# Patient Record
Sex: Male | Born: 1980 | Race: White | Hispanic: No | Marital: Single | Smoking: Current every day smoker
Health system: Southern US, Community
[De-identification: ages and names within clinical notes are randomized; demographics above are authoritative.]

## PROBLEM LIST (undated history)

## (undated) ENCOUNTER — Emergency Department (HOSPITAL_COMMUNITY): Discharge status: Absent without leave | Payer: Medicaid Other

## (undated) DIAGNOSIS — K449 Diaphragmatic hernia without obstruction or gangrene: Secondary | ICD-10-CM

## (undated) DIAGNOSIS — R1110 Vomiting, unspecified: Secondary | ICD-10-CM

## (undated) DIAGNOSIS — R0602 Shortness of breath: Secondary | ICD-10-CM

## (undated) DIAGNOSIS — Z9889 Other specified postprocedural states: Secondary | ICD-10-CM

## (undated) DIAGNOSIS — G4730 Sleep apnea, unspecified: Secondary | ICD-10-CM

## (undated) DIAGNOSIS — K219 Gastro-esophageal reflux disease without esophagitis: Secondary | ICD-10-CM

## (undated) DIAGNOSIS — G8929 Other chronic pain: Secondary | ICD-10-CM

## (undated) DIAGNOSIS — F419 Anxiety disorder, unspecified: Secondary | ICD-10-CM

## (undated) DIAGNOSIS — K439 Ventral hernia without obstruction or gangrene: Secondary | ICD-10-CM

## (undated) DIAGNOSIS — S0291XA Unspecified fracture of skull, initial encounter for closed fracture: Secondary | ICD-10-CM

## (undated) DIAGNOSIS — Z148 Genetic carrier of other disease: Secondary | ICD-10-CM

## (undated) DIAGNOSIS — F329 Major depressive disorder, single episode, unspecified: Secondary | ICD-10-CM

## (undated) DIAGNOSIS — F32A Depression, unspecified: Secondary | ICD-10-CM

## (undated) DIAGNOSIS — I10 Essential (primary) hypertension: Secondary | ICD-10-CM

## (undated) DIAGNOSIS — R109 Unspecified abdominal pain: Secondary | ICD-10-CM

## (undated) DIAGNOSIS — F3181 Bipolar II disorder: Secondary | ICD-10-CM

## (undated) HISTORY — DX: Vomiting, unspecified: R11.10

## (undated) HISTORY — DX: Other specified postprocedural states: Z98.890

## (undated) HISTORY — PX: INGUINAL HERNIA REPAIR: SUR1180

## (undated) HISTORY — DX: Other chronic pain: G89.29

## (undated) HISTORY — DX: Genetic carrier of other disease: Z14.8

## (undated) HISTORY — DX: Unspecified abdominal pain: R10.9

## (undated) HISTORY — DX: Ventral hernia without obstruction or gangrene: K43.9

## (undated) HISTORY — DX: Bipolar II disorder: F31.81

---

## 2001-04-12 ENCOUNTER — Encounter: Payer: Self-pay | Admitting: *Deleted

## 2001-04-12 ENCOUNTER — Emergency Department (HOSPITAL_COMMUNITY): Admission: EM | Admit: 2001-04-12 | Discharge: 2001-04-12 | Payer: Self-pay | Admitting: *Deleted

## 2001-04-24 ENCOUNTER — Ambulatory Visit (HOSPITAL_COMMUNITY): Admission: RE | Admit: 2001-04-24 | Discharge: 2001-04-24 | Payer: Self-pay | Admitting: Orthopaedic Surgery

## 2001-04-24 ENCOUNTER — Encounter: Payer: Self-pay | Admitting: Orthopaedic Surgery

## 2001-04-24 ENCOUNTER — Emergency Department (HOSPITAL_COMMUNITY): Admission: EM | Admit: 2001-04-24 | Discharge: 2001-04-24 | Payer: Self-pay | Admitting: Emergency Medicine

## 2001-09-22 ENCOUNTER — Ambulatory Visit (HOSPITAL_COMMUNITY): Admission: RE | Admit: 2001-09-22 | Discharge: 2001-09-22 | Payer: Self-pay | Admitting: Unknown Physician Specialty

## 2001-09-22 ENCOUNTER — Encounter: Payer: Self-pay | Admitting: Unknown Physician Specialty

## 2001-11-05 ENCOUNTER — Emergency Department (HOSPITAL_COMMUNITY): Admission: EM | Admit: 2001-11-05 | Discharge: 2001-11-05 | Payer: Self-pay | Admitting: *Deleted

## 2002-05-05 ENCOUNTER — Emergency Department (HOSPITAL_COMMUNITY): Admission: EM | Admit: 2002-05-05 | Discharge: 2002-05-05 | Payer: Self-pay | Admitting: Emergency Medicine

## 2003-05-13 ENCOUNTER — Ambulatory Visit: Payer: Self-pay | Admitting: Internal Medicine

## 2003-11-07 ENCOUNTER — Emergency Department (HOSPITAL_COMMUNITY): Admission: EM | Admit: 2003-11-07 | Discharge: 2003-11-07 | Payer: Self-pay | Admitting: Emergency Medicine

## 2004-02-18 ENCOUNTER — Emergency Department (HOSPITAL_COMMUNITY): Admission: EM | Admit: 2004-02-18 | Discharge: 2004-02-19 | Payer: Self-pay | Admitting: Emergency Medicine

## 2004-08-14 ENCOUNTER — Emergency Department (HOSPITAL_COMMUNITY): Admission: EM | Admit: 2004-08-14 | Discharge: 2004-08-14 | Payer: Self-pay | Admitting: Emergency Medicine

## 2004-08-22 ENCOUNTER — Emergency Department (HOSPITAL_COMMUNITY): Admission: EM | Admit: 2004-08-22 | Discharge: 2004-08-22 | Payer: Self-pay | Admitting: Emergency Medicine

## 2004-08-26 ENCOUNTER — Emergency Department (HOSPITAL_COMMUNITY): Admission: EM | Admit: 2004-08-26 | Discharge: 2004-08-26 | Payer: Self-pay | Admitting: Emergency Medicine

## 2004-09-27 ENCOUNTER — Emergency Department (HOSPITAL_COMMUNITY): Admission: EM | Admit: 2004-09-27 | Discharge: 2004-09-27 | Payer: Self-pay | Admitting: Emergency Medicine

## 2005-10-02 ENCOUNTER — Emergency Department (HOSPITAL_COMMUNITY): Admission: EM | Admit: 2005-10-02 | Discharge: 2005-10-02 | Payer: Self-pay | Admitting: Emergency Medicine

## 2005-10-10 ENCOUNTER — Ambulatory Visit (HOSPITAL_COMMUNITY): Admission: RE | Admit: 2005-10-10 | Discharge: 2005-10-10 | Payer: Self-pay | Admitting: Orthopaedic Surgery

## 2006-05-06 HISTORY — PX: ESOPHAGOGASTRODUODENOSCOPY: SHX1529

## 2006-05-10 ENCOUNTER — Ambulatory Visit: Payer: Self-pay | Admitting: Internal Medicine

## 2006-05-10 ENCOUNTER — Inpatient Hospital Stay (HOSPITAL_COMMUNITY): Admission: EM | Admit: 2006-05-10 | Discharge: 2006-05-12 | Payer: Self-pay | Admitting: Emergency Medicine

## 2007-01-13 ENCOUNTER — Emergency Department (HOSPITAL_COMMUNITY): Admission: EM | Admit: 2007-01-13 | Discharge: 2007-01-13 | Payer: Self-pay | Admitting: Emergency Medicine

## 2007-05-07 HISTORY — PX: KNEE SURGERY: SHX244

## 2007-08-27 ENCOUNTER — Encounter (HOSPITAL_COMMUNITY): Admission: RE | Admit: 2007-08-27 | Discharge: 2007-09-26 | Payer: Self-pay | Admitting: Orthopedic Surgery

## 2007-10-20 ENCOUNTER — Encounter (HOSPITAL_COMMUNITY): Admission: RE | Admit: 2007-10-20 | Discharge: 2007-11-19 | Payer: Self-pay | Admitting: Orthopedic Surgery

## 2007-11-23 ENCOUNTER — Encounter (HOSPITAL_COMMUNITY): Admission: RE | Admit: 2007-11-23 | Discharge: 2007-12-23 | Payer: Self-pay | Admitting: Orthopedic Surgery

## 2007-12-31 ENCOUNTER — Encounter (HOSPITAL_COMMUNITY): Admission: RE | Admit: 2007-12-31 | Discharge: 2008-01-30 | Payer: Self-pay | Admitting: Orthopedic Surgery

## 2008-03-21 ENCOUNTER — Ambulatory Visit: Payer: Self-pay | Admitting: Gastroenterology

## 2008-03-23 ENCOUNTER — Encounter: Payer: Self-pay | Admitting: Gastroenterology

## 2008-03-23 ENCOUNTER — Ambulatory Visit: Payer: Self-pay | Admitting: Gastroenterology

## 2008-03-23 ENCOUNTER — Ambulatory Visit (HOSPITAL_COMMUNITY): Admission: RE | Admit: 2008-03-23 | Discharge: 2008-03-23 | Payer: Self-pay | Admitting: Gastroenterology

## 2008-03-23 HISTORY — PX: ESOPHAGOGASTRODUODENOSCOPY: SHX1529

## 2008-04-08 ENCOUNTER — Ambulatory Visit: Payer: Self-pay | Admitting: Gastroenterology

## 2008-04-15 ENCOUNTER — Encounter (HOSPITAL_COMMUNITY): Admission: RE | Admit: 2008-04-15 | Discharge: 2008-05-03 | Payer: Self-pay | Admitting: Gastroenterology

## 2008-09-03 HISTORY — PX: APPENDECTOMY: SHX54

## 2008-10-09 ENCOUNTER — Emergency Department (HOSPITAL_COMMUNITY): Admission: EM | Admit: 2008-10-09 | Discharge: 2008-10-09 | Payer: Self-pay | Admitting: Emergency Medicine

## 2008-11-17 ENCOUNTER — Ambulatory Visit (HOSPITAL_COMMUNITY): Admission: RE | Admit: 2008-11-17 | Discharge: 2008-11-17 | Payer: Self-pay | Admitting: General Surgery

## 2009-01-10 ENCOUNTER — Emergency Department (HOSPITAL_COMMUNITY): Admission: EM | Admit: 2009-01-10 | Discharge: 2009-01-11 | Payer: Self-pay | Admitting: Emergency Medicine

## 2009-01-17 ENCOUNTER — Encounter (HOSPITAL_COMMUNITY): Admission: RE | Admit: 2009-01-17 | Discharge: 2009-02-01 | Payer: Self-pay | Admitting: General Surgery

## 2009-02-01 ENCOUNTER — Emergency Department (HOSPITAL_COMMUNITY): Admission: EM | Admit: 2009-02-01 | Discharge: 2009-02-02 | Payer: Self-pay | Admitting: Emergency Medicine

## 2009-02-03 ENCOUNTER — Encounter (INDEPENDENT_AMBULATORY_CARE_PROVIDER_SITE_OTHER): Payer: Self-pay | Admitting: General Surgery

## 2009-02-03 ENCOUNTER — Ambulatory Visit (HOSPITAL_COMMUNITY): Admission: RE | Admit: 2009-02-03 | Discharge: 2009-02-03 | Payer: Self-pay | Admitting: General Surgery

## 2009-02-03 HISTORY — PX: CHOLECYSTECTOMY: SHX55

## 2009-02-08 ENCOUNTER — Emergency Department (HOSPITAL_COMMUNITY): Admission: EM | Admit: 2009-02-08 | Discharge: 2009-02-09 | Payer: Self-pay | Admitting: Emergency Medicine

## 2009-03-20 DIAGNOSIS — F329 Major depressive disorder, single episode, unspecified: Secondary | ICD-10-CM

## 2009-03-20 DIAGNOSIS — K219 Gastro-esophageal reflux disease without esophagitis: Secondary | ICD-10-CM

## 2009-03-20 DIAGNOSIS — I10 Essential (primary) hypertension: Secondary | ICD-10-CM | POA: Insufficient documentation

## 2009-03-20 DIAGNOSIS — K920 Hematemesis: Secondary | ICD-10-CM

## 2009-03-20 DIAGNOSIS — R1084 Generalized abdominal pain: Secondary | ICD-10-CM

## 2009-03-20 DIAGNOSIS — F17200 Nicotine dependence, unspecified, uncomplicated: Secondary | ICD-10-CM

## 2009-03-20 DIAGNOSIS — M25519 Pain in unspecified shoulder: Secondary | ICD-10-CM

## 2009-03-20 DIAGNOSIS — A4902 Methicillin resistant Staphylococcus aureus infection, unspecified site: Secondary | ICD-10-CM | POA: Insufficient documentation

## 2009-03-20 DIAGNOSIS — Z9189 Other specified personal risk factors, not elsewhere classified: Secondary | ICD-10-CM | POA: Insufficient documentation

## 2009-03-20 DIAGNOSIS — R630 Anorexia: Secondary | ICD-10-CM

## 2009-03-21 ENCOUNTER — Ambulatory Visit: Payer: Self-pay | Admitting: Internal Medicine

## 2009-03-21 ENCOUNTER — Encounter: Payer: Self-pay | Admitting: Gastroenterology

## 2009-03-21 DIAGNOSIS — R1013 Epigastric pain: Secondary | ICD-10-CM | POA: Insufficient documentation

## 2009-03-21 DIAGNOSIS — R635 Abnormal weight gain: Secondary | ICD-10-CM

## 2009-04-03 ENCOUNTER — Encounter: Payer: Self-pay | Admitting: Gastroenterology

## 2009-04-04 ENCOUNTER — Telehealth (INDEPENDENT_AMBULATORY_CARE_PROVIDER_SITE_OTHER): Payer: Self-pay

## 2009-05-08 ENCOUNTER — Encounter (INDEPENDENT_AMBULATORY_CARE_PROVIDER_SITE_OTHER): Payer: Self-pay | Admitting: *Deleted

## 2009-05-15 ENCOUNTER — Encounter: Payer: Self-pay | Admitting: Gastroenterology

## 2009-05-15 ENCOUNTER — Telehealth (INDEPENDENT_AMBULATORY_CARE_PROVIDER_SITE_OTHER): Payer: Self-pay

## 2009-05-15 DIAGNOSIS — R112 Nausea with vomiting, unspecified: Secondary | ICD-10-CM | POA: Insufficient documentation

## 2009-05-18 ENCOUNTER — Telehealth (INDEPENDENT_AMBULATORY_CARE_PROVIDER_SITE_OTHER): Payer: Self-pay

## 2009-05-18 LAB — CONVERTED CEMR LAB
AST: 16 units/L (ref 0–37)
Alkaline Phosphatase: 88 units/L (ref 39–117)
BUN: 12 mg/dL (ref 6–23)
Basophils Absolute: 0 10*3/uL (ref 0.0–0.1)
Basophils Relative: 0 % (ref 0–1)
Chloride: 102 meq/L (ref 96–112)
Creatinine, Ser: 0.92 mg/dL (ref 0.40–1.50)
HCT: 45.8 % (ref 39.0–52.0)
Lipase: 12 units/L (ref 0–75)
MCHC: 34.5 g/dL (ref 30.0–36.0)
MCV: 91.1 fL (ref 78.0–100.0)
Monocytes Relative: 9 % (ref 3–12)
Neutrophils Relative %: 54 % (ref 43–77)
Platelets: 203 10*3/uL (ref 150–400)
Potassium: 3.7 meq/L (ref 3.5–5.3)
Sodium: 141 meq/L (ref 135–145)
Total Bilirubin: 0.6 mg/dL (ref 0.3–1.2)
Total Protein: 6.9 g/dL (ref 6.0–8.3)
WBC: 7.6 10*3/uL (ref 4.0–10.5)

## 2009-05-24 ENCOUNTER — Ambulatory Visit: Payer: Self-pay | Admitting: Gastroenterology

## 2009-05-26 ENCOUNTER — Encounter: Payer: Self-pay | Admitting: Gastroenterology

## 2009-05-29 LAB — CONVERTED CEMR LAB
Basophils Absolute: 0 10*3/uL (ref 0.0–0.1)
Basophils Relative: 0 % (ref 0–1)
Cortisol - AM: 6.4 ug/dL (ref 4.3–22.4)
Eosinophils Absolute: 0.3 10*3/uL (ref 0.0–0.7)
Eosinophils Relative: 4 % (ref 0–5)
HCT: 44.7 % (ref 39.0–52.0)
Hemoglobin: 15 g/dL (ref 13.0–17.0)
Hgb A1c MFr Bld: 6.2 % — ABNORMAL HIGH (ref 4.6–6.1)
Lymphocytes Relative: 27 % (ref 12–46)
Lymphs Abs: 2.1 10*3/uL (ref 0.7–4.0)
MCHC: 33.6 g/dL (ref 30.0–36.0)
MCV: 94.1 fL (ref 78.0–100.0)
Monocytes Absolute: 0.8 10*3/uL (ref 0.1–1.0)
Monocytes Relative: 10 % (ref 3–12)
Neutro Abs: 4.8 10*3/uL (ref 1.7–7.7)
Neutrophils Relative %: 60 % (ref 43–77)
Platelets: 225 10*3/uL (ref 150–400)
RBC: 4.75 M/uL (ref 4.22–5.81)
RDW: 12.7 % (ref 11.5–15.5)
WBC: 8 10*3/uL (ref 4.0–10.5)

## 2009-06-13 ENCOUNTER — Encounter: Payer: Self-pay | Admitting: Gastroenterology

## 2009-06-13 ENCOUNTER — Telehealth (INDEPENDENT_AMBULATORY_CARE_PROVIDER_SITE_OTHER): Payer: Self-pay

## 2009-06-19 ENCOUNTER — Emergency Department (HOSPITAL_COMMUNITY): Admission: EM | Admit: 2009-06-19 | Discharge: 2009-06-20 | Payer: Self-pay | Admitting: Emergency Medicine

## 2009-08-14 ENCOUNTER — Telehealth (INDEPENDENT_AMBULATORY_CARE_PROVIDER_SITE_OTHER): Payer: Self-pay | Admitting: *Deleted

## 2009-08-15 ENCOUNTER — Encounter: Payer: Self-pay | Admitting: Gastroenterology

## 2009-08-29 ENCOUNTER — Emergency Department (HOSPITAL_COMMUNITY): Admission: EM | Admit: 2009-08-29 | Discharge: 2009-08-30 | Payer: Self-pay | Admitting: Emergency Medicine

## 2009-09-06 ENCOUNTER — Ambulatory Visit: Payer: Self-pay | Admitting: Gastroenterology

## 2009-10-18 ENCOUNTER — Encounter: Payer: Self-pay | Admitting: Gastroenterology

## 2009-10-18 ENCOUNTER — Telehealth (INDEPENDENT_AMBULATORY_CARE_PROVIDER_SITE_OTHER): Payer: Self-pay | Admitting: *Deleted

## 2009-11-14 ENCOUNTER — Ambulatory Visit (HOSPITAL_COMMUNITY): Admission: RE | Admit: 2009-11-14 | Discharge: 2009-11-14 | Payer: Self-pay | Admitting: Family Medicine

## 2009-12-18 ENCOUNTER — Encounter: Payer: Self-pay | Admitting: Gastroenterology

## 2010-01-12 ENCOUNTER — Encounter: Payer: Self-pay | Admitting: Gastroenterology

## 2010-01-24 ENCOUNTER — Emergency Department (HOSPITAL_COMMUNITY): Admission: EM | Admit: 2010-01-24 | Discharge: 2010-01-24 | Payer: Self-pay | Admitting: Emergency Medicine

## 2010-02-04 ENCOUNTER — Emergency Department (HOSPITAL_COMMUNITY): Admission: EM | Admit: 2010-02-04 | Discharge: 2010-02-04 | Payer: Self-pay | Admitting: Emergency Medicine

## 2010-02-13 ENCOUNTER — Encounter: Payer: Self-pay | Admitting: Urgent Care

## 2010-02-13 ENCOUNTER — Ambulatory Visit: Admission: RE | Admit: 2010-02-13 | Discharge: 2010-02-13 | Payer: Self-pay | Admitting: Neurology

## 2010-02-27 ENCOUNTER — Emergency Department (HOSPITAL_COMMUNITY): Admission: EM | Admit: 2010-02-27 | Discharge: 2010-02-27 | Payer: Self-pay | Admitting: Emergency Medicine

## 2010-04-29 ENCOUNTER — Emergency Department (HOSPITAL_COMMUNITY)
Admission: EM | Admit: 2010-04-29 | Discharge: 2010-04-29 | Payer: Self-pay | Source: Home / Self Care | Admitting: Emergency Medicine

## 2010-06-05 NOTE — Progress Notes (Signed)
  Phone Note Call from Patient   Caller: Patient Summary of Call: Pt called and said the Carafate Rx was too expensive @ $170.00. He wants to know if there is something less  expensive to try. His cell # is [phone]. Initial call taken by: Cloria Spring LPN,  May 18, 2009 1:16 PM     Appended Document:  Please double check with pharmacy to see if they priced generic. I find it hard to believe this med is that expensive.  Appended Document:  Spoke with [facility]- there is no generic in the liquid form only in the tablets and pts insurance didnt pay anything on Rx.   Appended Document:  see how much for carafate GENERIC pill, one gram qac and at bedtime, 2 weeks supply, one refill.  Appended Document:  Called in Rx- the cost is $29.91- called pts number above and left voicemail with the information.   Appended Document:  Good.  Pt needs to keep OV for tomorrow with [facility].

## 2010-06-05 NOTE — Letter (Signed)
Summary: MENTAL HEALTH REFERRAL  MENTAL HEALTH REFERRAL   Imported By: Ave Filter 09/06/2009 11:02:46  _____________________________________________________________________  External Attachment:    Type:   Image     Comment:   External Document  Appended Document: MENTAL HEALTH REFERRAL Pt is already a pt at [facility] health..He was just seen there 2 weeks ago and has a f/u appt. on 10/11/09.

## 2010-06-05 NOTE — Letter (Signed)
Summary: DISABILITY DETERMINATION  DISABILITY DETERMINATION   Imported By: Rexene Alberts 01/12/2010 14:03:45  _____________________________________________________________________  External Attachment:    Type:   Image     Comment:   External Document

## 2010-06-05 NOTE — Assessment & Plan Note (Signed)
Summary: CHRONIC ABD PAIN   Visit Type:  Follow-up Visit Primary Care Provider:  Health Department  Chief Complaint:  follow up.  History of Present Illness: Still having abd pain. Can't do colonoscopy because prep makes him sick. Went to ED last week and saw Dr. Colon Branch. 3 IVF bags. Gave him Zofran for nausea. Works "alright". Pt seen by [facility] and "transferred back to Dr. Darrick Penna." Has right sided pain. Has frequent BMs.   Current Medications (verified): 1)  Claritin 10 Mg Tabs (Loratadine) .... Once Daily 2)  Xanax 1 Mg Tabs (Alprazolam) .... Qid 3)  Lisinopril-Hydrochlorothiazide 20-12.5 Mg Tabs (Lisinopril-Hydrochlorothiazide) .... Once Daily 4)  Omeprazole 40 Mg Cpdr (Omeprazole) .... Two Times A Day 5)  Promethazine Hcl 25 Mg Tabs (Promethazine Hcl) .... Take 1/2 Po 30 Minutes Prior To Meals and May Use 1/2 Tab By Mouth Every 4-6 Hours As Needed N/v 6)  Baclofen 10 Mg Tabs (Baclofen) .Marland Kitchen.. 1 By Mouth At 7am, 3 Pm and 9 Pm 7)  Lemental .... Two Times A Day 8)  Ambien 5 Mg Tabs (Zolpidem Tartrate) .... At Bedtime  Allergies (verified): 1)  ! Darvocet  Past History:  Past Medical History: Anxiety Disorder Depression GERD Hypertension history of MRSA Right shoulder pain MORBID OBESITY C/O CHRONIC ABD PAIN/VOMITING **EGD January 2008, Dr. Karilyn Cota, erosive reflux esophagitis erosive antral gastritis, H. pylori serologies neg  **EGD, November 2009, Dr. Cira Servant, gastritis. Benign esophageal polyp. No evidence of H. pylori. **Bravo pH study in November 2009, on Prilosec 40 mg b.i.d., showed adequate acid suppression **DEC 2009: nl GES **May 2010 while on vacation in [facility] and had an appendectomy.  **SEP 2010 for abd pain- HIDA SCAN SEP 2010 GB EF 93%. Cholecystectomy in OCT 2010.  PATH: chronic cholecystitis, no stones. **FEB 2011 SEEN AND EVALUATED AT [facility] abd wall pain, reflux surgery may follow weight loss, needs impedance prior to surgery. TCS to evaluate diarrhea. PT STATED HE  COULD NOT TOLERATE THE MIRALAX PREP.  Past Surgical History: Reviewed history from 03/21/2009 and no changes required. Hernia Surgery, left inguinal Knee surgery, right, 2009 Cholecystectomy, 10/10 Appendectomy, 5/10  Social History: Reviewed history from 05/24/2009 and no changes required. single. 2 children. Unemployed since 2007 with work-related injury.  Denies any alcohol use on a regular basis. Pt has no insurance.  Review of Systems       Per HPI otherwise all systems negative.  Vital Signs:  Patient profile:   30 year old male Height:      68 inches Weight:      303 pounds BMI:     46.24 Temp:     98.0 degrees F oral Pulse rate:   88 / minute BP sitting:   112 / 78  (left arm) Cuff size:   large  Vitals Entered By: Hendricks Limes LPN (Sep 06, 1608 10:11 AM)  Physical Exam  General:  Well developed, well nourished, no acute distress. Head:  Normocephalic and atraumatic. Eyes:  PERRLA, no icterus. Mouth:  No deformity or lesions. Neck:  Supple; no masses. Lungs:  Clear throughout to auscultation. Heart:  Regular rate and rhythm; no murmurs, rubs,  or bruits. Abdomen:  Soft, MILD ttp X4, without guarding, and without rebound nondistended. Normal bowel sounds. obese.    Impression & Recommendations:  Problem # 1:  ABDOMINAL PAIN, GENERALIZED (ICD-789.07) Assessment Unchanged Had a long discussion with the patient and his mother. Explained his complaint of not being able to eat anything is not consistent with his  weight increase over the last 2 years or with his being morbidly obese. Pt referred to mental health to manage his anxiety. Also he is given a referral for Nutrition for GERD/morbid obesity/weight loss program. Continue OMP and Baclofen. Phenergan as needed. Add Imipraine for chronic pain management. Follow up with Dr. Gerilyn Pilgrim. OPV in 3-4 mos. Pt is not a candidate for Nissen fundoplication due to his being morbidly obese.  TIME SPENT: 40  minutes to obtain  H&P, explain disease processes, and management  CC: PCP  Orders: Est. Patient Level V (64403) Prescriptions: IMIPRAMINE HCL 10 MG TABS (IMIPRAMINE HCL) 1 by mouth at bedtime for 3 days, 2 by mouth at bedtime for 3 days, and 3 by mouth qhs  #90 x 5   Entered and Authorized by:   West Bali MD   Signed by:   West Bali MD on 09/06/2009   Method used:   Electronically to        [facility]* (retail)       [address] [place], [place]  [address]       Ph: [phone]       Fax: [phone]   RxID:   [id number] PROMETHAZINE HCL 25 MG TABS (PROMETHAZINE HCL) Take 1/2 po 30 minutes prior to meals and may use 1/2 tab by mouth every 4-6 hours as needed n/v  #50 x 5   Entered and Authorized by:   West Bali MD   Signed by:   West Bali MD on 09/06/2009   Method used:   Electronically to        [facility]* (retail)       [address]       [place], [place]  [address]       Ph: [phone]       Fax: [phone]   RxID:   [id number] BACLOFEN 10 MG TABS (BACLOFEN) 1 by mouth at 7am, 12n, 3 pm and 9 pm  #120 x 5   Entered and Authorized by:   West Bali MD   Signed by:   West Bali MD on 09/06/2009   Method used:   Electronically to        [facility]* (retail)       [address] [place], [place]  [address]       Ph: [phone]       Fax: [phone]   RxID:   [id number]   Appended Document: CHRONIC ABD PAIN reminder in computer

## 2010-06-05 NOTE — Letter (Signed)
Summary: Appointment Reminder  [facility]  [address]   [place], [place] [address]   Phone: [phone]  Fax: [phone]       May 08, 2009   [name] [address] [place], [place]  [address] November 19, 1980    Dear [name],  We have been unable to reach you by phone to schedule a follow up   appointment that was recommended for you by Dr. Darrick Penna. It is very   important that we reach you to schedule an appointment. We hope that you  allow [place] to participate in your health care needs. Please contact us at  [phone] at your earliest convenience to schedule your appointment.  Sincerely,    [facility] R. Roetta Sessions, M.D.    Kassie Mends, M.D. Lorenza Burton, FNP-BC    Tana Coast, PA-C Phone: [phone]    Fax: [phone]

## 2010-06-05 NOTE — Progress Notes (Signed)
Summary: no show for dr Ronal Fear appt  Phone Note From Other Clinic   Summary of Call: Dr Ronal Fear office called to let us know the pt was a no show for his intake class for pain management. Initial call taken by: Diana Eves,  October 18, 2009 11:46 AM

## 2010-06-05 NOTE — Miscellaneous (Signed)
Summary: CMET, CBC-DIFF, AMYLASE, LIPASE  Clinical Lists Changes  Orders: Added new Test order of T-Amylase [phone]) - Signed Added new Test order of T-Lipase [phone]) - Signed Added new Test order of T-Comprehensive Metabolic Panel ([id number]) - Signed Added new Test order of T-CBC w/Diff ([id number]) - Signed  Appended Document: CMET, CBC-DIFF, AMYLASE, LIPASE Order faxed to [facility].  Appended Document: CMET, CBC-DIFF, AMYLASE, LIPASE I already did the order for these.

## 2010-06-05 NOTE — Assessment & Plan Note (Signed)
Summary: FU WITH MEDS NOT Pamelia Hoit WITH STOMACH/SS   Visit Type:  Follow-up Visit Primary Care Provider:  [facility] DEPARTMENT   History of Present Illness: Pt initially seen in 2008 by Dr. Karilyn Cota for hematemesis and reflux. Pt was on Pepcid and Nexium. He was regulary using alcohol, cigarettes, and marijuana. EGD showed erosive reflux esophagitis and erosive antral gastritis. Pt not seen again Novmber 2009 and was c/o epigastric pain, nausea, and vomiting. He continued with marijuana use and stopped rinking EtOH. Pt weighed 256 lbs, BMI 38.9. Repeat EGD/gastric Bx NOV 2009 w/ Bravo capsule placement.  The study was done on omeprazole 40 mg twice a day. It showed adequate acid suppresion, but suggested non-acid reflux. Recommended omeprazole BID, weight loss, smoking and marijuana cessation, and a low fat diet. Biopsies showed reactive gastropathy. DEC 2009: GES normal.  Pt seen for abd pain May 2010 while on vacation in [facility] and had an appendectomy. Pt seen in ED in June 2010 c/o abd pain.  Seen in ED SEP 2010 for abd pain, who referred him to Gen Surg. HIDA SCAN SEP 2010 GB EF 93%. Cholecystectomy in OCT 2010.  PATH: chronic cholecystitis, no stones. Seen in ED OCT 2010 for nausea, vomiting and abd pain Labs from NOV 2009 to OCT 2010 showed nl Hb, CMP, and Lipase. Seen in GI clinic NOV 2010. Nausea and vomiting "had improved since GB removed." Continued to have ongoing epigastric pain. Had now developed postprandial bowel movements occurring 30 minutes after meals, 4 BMs/day. No BRBPR or melena. WEIGHT UP TO 288 LBS, BMI 43.6. TSH nl.  JAN 2011 c/o NVABD PAIN- CBC/CMP/LIPASE-NL  Still reports vomiting 3-4 times a day (sees "red", large and small amount of liquid). Keeping down water. Solid stays down-occasionally. BMs: 4 times a day (loose, no blood). Heartburn burning sensation in chest and stomach (epigastrium). Can also have in RuQ/right flank and back. Smokes: 1 pkq2 days. No marijuana: last  time 4 weeks ago. No dysuria, or blood in urine. Pt takes additional dose of Prozac occassionally. Pt has tried REGLAN: "DOESN'T HELP." Taking out gallbladder "didn't help symptoms."  Allergies: 1)  ! Darvocet  Past History:  Past Medical History: Last updated: 03/21/2009 Anxiety Disorder Depression GERD Hypertension history of MRSA Right shoulder pain EGD January 2008, Dr. Karilyn Cota, erosive reflux esophagitis erosive antral gastritis, H. pylori serologies neg  EGD, November 2009, Dr. Cira Servant, gastritis. Benign esophageal polyp. No evidence of H. pylori. Bravo pH study in November 2009, on Prilosec 40 mg b.i.d., showed adequate acid suppression  Past Surgical History: Last updated: 03/21/2009 Hernia Surgery, left inguinal Knee surgery, right, 2009 Cholecystectomy, 10/10 Appendectomy, 5/10  Social History: single. 2 children. Unemployed since 2007 with work-related injury.  Denies any alcohol use on a regular basis. Pt has no insurance.  Review of Systems       WORKUP FOR NAUSEA AND VOMITING INCLUDES: EGDx2, CTx2, GES, U/S BRAVO CAPSULE STUDY: On day 1, the duration of the study was 23 hours.  He had 8  episodes of reflux.  Three episodes did last greater than 5 minutes.  The longest reflux was 26 minutes.  He had 55 minutes with pH less than  4.  On day 1, his DeMeester score was 11.5 (normal less than 14.72).  On day 2, the study period lasted 14 hours and 38 minutes.  He had 29 episodes of reflux.  One episode lasted greater than 5 minutes.  The  longest duration of reflux was 6 minutes. 31 minutes  with a pH less than 4.  His DeMeester score on day 2 was 10.2 (normal less than  14.72). His symptom association probability is likely inaccurate due to  this fact.  The symptom association probability was 62.2 with  regurgitation and 51.2 with chest pain.    Vital Signs:  Patient profile:   30 year old male Height:      68 inches Weight:      303 pounds BMI:     46.24 Temp:     98.5  degrees F oral Pulse rate:   88 / minute BP sitting:   118 / 82  (left arm) Cuff size:   large  Vitals Entered By: Hendricks Limes LPN (May 24, 2009 10:02 AM)  Physical Exam  General:  Well developed, well nourished, no acute distress. Head:  Normocephalic and atraumatic. Eyes:  PERRLA, no icterus. Mouth:  No deformity or lesions, dentition poor. Neck:  Supple; no masses. Lungs:  Clear throughout to auscultation. Heart:  Regular rate and rhythm; no murmurs Abdomen:  Soft, nondistended. No masses noted. Normal bowel sounds. Mild TTP in LUQ/epigastrium without guarding and without rebound.   Extremities:  No cyanosis, edema or deformities noted. Neurologic:  Alert and  oriented x4;  grossly normal neurologically.  Impression & Recommendations:  Problem # 1:  NAUSEA WITH VOMITING (ICD-787.01) Pt has gained 15 lbs since NOV, now BMI 46 . Sx likely 2o to uncontrolled non-acid reflux and/or functional abdominal pain. Add Baclofen and Phenergan. Use Phenergan qac and as needed. Continue omeprazole. Again instructed pt to lose weight, stop smoking cigs and marijuana, and follow a low fat diet. HO given. Refer to [facility] for GI -2nd opinion RE: NVabd pain and gastric bypass evaluation.  Cannot titrate Prozac, pt at max dose. OPV in 4 mos. Given [facility] reflux HO. Check cortisol, HgA1c, and Hb today.  Orders: T-CBC w/Diff ([id number]) T-Hemoglobin A1C (09811) T-Cortisol, AM (91478)  Patient Instructions: 1)  Continue Prozac at 40 mg two times a day. That is the maximum daily dose. 2)  Add Baclofen 10 mg at 7 AM, 3 PM, AND 9 PM. 3)  Continue efforts to stop smoking and omeprazole twice daily. 4)  Lose weight. Follow a low fat diet. SEE HANDOUT. 5)  Will check labs today. 6)  Will refer to [facility] for 2nd opinion and weight loss surgery evaluation. 7)  Retrun visit in 4 months. 8)  The medication list was reviewed and reconciled.  All changed / newly prescribed medications were  explained.  A complete medication list was provided to the patient / caregiver. Prescriptions: BACLOFEN 10 MG TABS (BACLOFEN) 1 by mouth at 7am, 3 pm and 9 pm  #90 x 5   Entered and Authorized by:   West Bali MD   Signed by:   West Bali MD on 05/24/2009   Method used:   Electronically to        [facility] (retail)       [address]       [place], [place]  [address]       Ph: [phone]       Fax: [phone]   RxID:   [id number] PROMETHAZINE HCL 25 MG TABS (PROMETHAZINE HCL) Take 1/2 po 30 minutes prior to meals and may use 1/2 tab by mouth every 4-6 hours as needed n/v  #50 x 5   Entered and Authorized by:   Duncan Dull  Loreen Freud MD   Signed by:   West Bali MD on 05/24/2009   Method used:   Electronically to        [facility]* (retail)       [address]       [place], [place]  [address]       Ph: [phone]       Fax: [phone]   RxID:   [id number]   Appended Document: Orders Update-charge    Clinical Lists Changes  Orders: Added new Service order of Est. Patient Level V (40347) - Signed

## 2010-06-05 NOTE — Letter (Signed)
Summary: DR [facility] REFERRAL  DR [facility] REFERRAL   Imported By: Ave Filter 08/15/2009 12:07:58  _____________________________________________________________________  External Attachment:    Type:   Image     Comment:   External Document

## 2010-06-05 NOTE — Progress Notes (Signed)
  Phone Note Call from Patient   Caller: Daryl Eastern III Summary of Call: patient called today [facility]. he is scheduled for tc tomorrow 08/15/09. states that his reflux is flaring up so bad that he cant keep the prep down. he was told he would need another referral and he wants to know if he can just have the procedure done here. please advise Initial call taken by: ginger urshel     Appended Document:  Please call pt. He should call [facility] regarding difficulty with the prep. He can stop the prep for 2 hours and then restart it slowly. He cannot have it done at [facility] because his GI doctors at [facility] need to perform the test.  Appended Document:  we (me and Durward Mallard) tried to call pt x2 with no answer  Appended Document:  We called [facility] and they gave pt the Moiralax prep. Pt says he can't take the prep due to his reflux.   Pleas call pt and let hom  know that if he can't take a prep then he can't have a colonoscopy. We will get the records from [facility] and give him the first available appt for GERD, E: 30 visit.   Appended Document:  Also initiate pain clinic referral for abd wall injections, Dx: chronic abd pain.  Appended Document:  Pt scheduled to see Dr Darrick Penna 09/06/09@10 :00a.m.  Pt aware of appt.  Referral faxed to Dr Ronal Fear office.

## 2010-06-05 NOTE — Progress Notes (Signed)
Summary: Pt wants to hear from [facility]/ still having pain chest/side  Phone Note Call from Patient   Caller: Patient Summary of Call: Pt called and said he was seen @ [facility] on 06/05/2009. He is waiting to hear more. Wants to know if Dr. Darrick Penna knows whats going on. The [facility] was to do a TCS, but that they were going to arrange for him to have some kind of injections here, so he would not have to travel to [place]. Pt says he is still having alot of pain in his chest and in his right side. Please advise. Initial call taken by: Cloria Spring LPN,  June 13, 2009 11:49 AM     Appended Document: Pt wants to hear from [facility]/ still having pain chest/side Please call pt. He should call [facility] GI DEPT and inquire about additional recommendations. If he is having pain then will refer to pain managment for chronic abd pain and chest pain. May add Maalox or Mylanta as needed for his GERD. Continue low fat diet, baclofen, and omeprazole. For abd pain, he should begin bentyl 10 mg #60 1 by mouth 30 minutes prior to meals two times a day, rfx5.   Please obtain note from [facility].  Appended Document: Pt wants to hear from [facility]/ still having pain chest/side LMOM to call.  Appended Document: Pt wants to hear from [facility]/ still having pain chest/side Pt informed. Rx called to April @ [facility].  Appended Document: Pt wants to hear from [facility]/ still having pain chest/side opv may 2011, Dx: vomiting

## 2010-06-05 NOTE — Letter (Signed)
Summary: [facility]  MEDICAL RECORDS  [facility]  MEDICAL RECORDS   Imported By: Rexene Alberts 12/18/2009 14:37:02  _____________________________________________________________________  External Attachment:    Type:   Image     Comment:   External Document

## 2010-06-05 NOTE — Letter (Signed)
Summary: [facility]ICE [facility]  [facility]ICE [facility]   Imported By: Diana Eves 06/13/2009 16:46:01  _____________________________________________________________________  External Attachment:    Type:   Image     Comment:   External Document

## 2010-06-05 NOTE — Letter (Signed)
Summary: [facility] REFERRAL  [facility] REFERRAL   Imported By: Ave Filter 05/26/2009 12:41:41  _____________________________________________________________________  External Attachment:    Type:   Image     Comment:   External Document

## 2010-06-05 NOTE — Letter (Signed)
Summary: NUTRITION REFERRAL  NUTRITION REFERRAL   Imported By: Ave Filter 09/06/2009 10:46:14  _____________________________________________________________________  External Attachment:    Type:   Image     Comment:   External Document

## 2010-06-05 NOTE — Letter (Signed)
Summary: External Other  External Other   Imported By: Peggyann Shoals 10/18/2009 14:28:38  _____________________________________________________________________  External Attachment:    Type:   Image     Comment:   External Document

## 2010-06-05 NOTE — Progress Notes (Signed)
Summary: abdominal pain/ n/v  Phone Note Call from Patient   Caller: Patient Summary of Call: Pt called and scheduled a f/u appt for 05/24/2009. He wants to know what to do for his abdominal pain. He said that it is in the  center of his abd, intermittent,but sometimes he almost doubles over. He is having nausea and vomiting he says sometimes 5-6 times daily. He is taking Omeprazole 3 times daily instead of 2 times daily, and he said that helps some. Please advise. He can be reached at [phone] and uses [facility]. Initial call taken by: Cloria Spring LPN,  May 15, 2009 11:34 AM     Appended Document: Orders Update labs and rx 1. Recommend CMET, lipase, amylase, CBC today.   2. Drink clear liquids, avoid heavy and fatty foods when having symptoms.   3. RX for phenergan sent to [facility]  No narcotics to be given.  Will add carafate, RX sent to [facility] 4. Keep OV with [facility]. 5.  If fever, or symptoms uncontrollable, go to ED.   Clinical Lists Changes  Problems: Added new problem of NAUSEA WITH VOMITING (ICD-787.01) Medications: Added new medication of CARAFATE 1 GM/10ML SUSP (SUCRALFATE) one gram by mouth qach and at bedtime as needed abd pain - Signed Rx of PROMETHAZINE HCL 25 MG TABS (PROMETHAZINE HCL) one by mouth every 4-6 hours as needed n/v;  #20 x 0;  Signed;  Entered by: Leanna Battles Dixon Boos;  Authorized by: Leanna Battles. Dixon Boos;  Method used: Electronically to [facility]*, [address], [place], [place], [place]  [address], Ph: [phone], Fax: [phone] Rx of CARAFATE 1 GM/10ML SUSP (SUCRALFATE) one gram by mouth qach and at bedtime as needed abd pain;  #2 weeks x 0;  Signed;  Entered by: Leanna Battles Dixon Boos;  Authorized by: Leanna Battles. Dixon Boos;  Method used: Electronically to [facility]*, [address], [place], [place], [place]  [address], Ph: [phone], Fax: [phone] Orders: Added new Test order of T-Amylase  [phone]) - Signed Added new Test order of T-Lipase [phone]) - Signed Added new Test order of T-Comprehensive Metabolic Panel [phone]) - Signed Added new Test order of T-CBC w/Diff ([id number]) - Signed    Prescriptions: CARAFATE 1 GM/10ML SUSP (SUCRALFATE) one gram by mouth qach and at bedtime as needed abd pain  #2 weeks x 0   Entered and Authorized by:   Leanna Battles. Dixon Boos   Signed by:   Leanna Battles Dixon Boos on 05/15/2009   Method used:   Electronically to        [facility] (retail)       [address]       [place], [place]  [address]       Ph: [phone]       Fax: [phone]   RxID:   [id number] PROMETHAZINE HCL 25 MG TABS (PROMETHAZINE HCL) one by mouth every 4-6 hours as needed n/v  #20 x 0   Entered and Authorized by:   Leanna Battles. Dixon Boos   Signed by:   Leanna Battles Dixon Boos on 05/15/2009   Method used:   Electronically to        [facility] (retail)       [address] [place], [place]  [address]       Ph: [phone]  Fax: [phone]   RxID:   [id number]    Appended Document: abdominal pain/ n/v Pt informed, correct number   cell [phone].  Appended Document: abdominal pain/ n/v Order faxed to [facility].

## 2010-06-08 NOTE — Medication Information (Signed)
Summary: PROMETHAZINE HCL 25MG   PROMETHAZINE HCL 25MG    Imported By: Rexene Alberts 02/13/2010 13:17:09  _____________________________________________________________________  External Attachment:    Type:   Image     Comment:   External Document  Appended Document: PROMETHAZINE HCL 25MG     Prescriptions: PROMETHAZINE HCL 25 MG TABS (PROMETHAZINE HCL) Take 1/2 po 30 minutes prior to meals and may use 1/2 tab by mouth every 4-6 hours as needed n/v  #30 x 0   Entered and Authorized by:   Joselyn Arrow FNP-BC   Signed by:   Joselyn Arrow FNP-BC on 02/13/2010   Method used:   Electronically to        [facility]* (retail)       [address] [place], [place]  [address]       Ph: [phone]       Fax: [phone]   RxID:   [id number]

## 2010-07-05 HISTORY — PX: VENTRAL HERNIA REPAIR: SHX424

## 2010-07-16 LAB — URINE MICROSCOPIC-ADD ON

## 2010-07-16 LAB — URINALYSIS, ROUTINE W REFLEX MICROSCOPIC
Bilirubin Urine: NEGATIVE
Glucose, UA: NEGATIVE mg/dL
Protein, ur: NEGATIVE mg/dL
Specific Gravity, Urine: >1.03 — ABNORMAL HIGH (ref 1.005–1.030)
Urobilinogen, UA: 0.2 mg/dL (ref 0.0–1.0)

## 2010-07-19 LAB — DIFFERENTIAL
Basophils Absolute: 0 10*3/uL (ref 0.0–0.1)
Eosinophils Absolute: 0.3 10*3/uL (ref 0.0–0.7)
Eosinophils Relative: 3 % (ref 0–5)
Lymphocytes Relative: 26 % (ref 12–46)
Monocytes Relative: 7 % (ref 3–12)
Neutrophils Relative %: 65 % (ref 43–77)

## 2010-07-19 LAB — URINALYSIS, ROUTINE W REFLEX MICROSCOPIC
Ketones, ur: NEGATIVE mg/dL
Specific Gravity, Urine: 1.015 (ref 1.005–1.030)
Urobilinogen, UA: 0.2 mg/dL (ref 0.0–1.0)

## 2010-07-19 LAB — BASIC METABOLIC PANEL
Chloride: 100 mEq/L (ref 96–112)
Creatinine, Ser: 1.14 mg/dL (ref 0.4–1.5)
GFR calc Af Amer: >60 mL/min (ref >60)
Glucose, Bld: 105 mg/dL — ABNORMAL HIGH (ref 70–99)

## 2010-07-19 LAB — CBC
Platelets: 222 10*3/uL (ref 150–400)
RBC: 4.52 MIL/uL (ref 4.22–5.81)

## 2010-07-19 LAB — LACTIC ACID, PLASMA: Lactic Acid, Venous: 1.5 mmol/L (ref 0.5–2.2)

## 2010-07-24 LAB — COMPREHENSIVE METABOLIC PANEL
ALT: 26 U/L (ref 0–53)
AST: 22 U/L (ref 0–37)
Albumin: 5 g/dL (ref 3.5–5.2)
Alkaline Phosphatase: 84 U/L (ref 39–117)
BUN: 19 mg/dL (ref 6–23)
Chloride: 100 mEq/L (ref 96–112)
GFR calc Af Amer: 49 mL/min — ABNORMAL LOW (ref >60)
Potassium: 3.3 mEq/L — ABNORMAL LOW (ref 3.5–5.1)
Sodium: 136 mEq/L (ref 135–145)
Total Protein: 8.2 g/dL (ref 6.0–8.3)

## 2010-07-24 LAB — POCT I-STAT, CHEM 8
Calcium, Ion: 1.1 mmol/L — ABNORMAL LOW (ref 1.12–1.32)
HCT: 52 % (ref 39.0–52.0)
Sodium: 135 mEq/L (ref 135–145)
TCO2: 23 mmol/L (ref 0–100)

## 2010-07-24 LAB — DIFFERENTIAL
Basophils Relative: 0 % (ref 0–1)
Eosinophils Relative: 1 % (ref 0–5)
Monocytes Absolute: 0.7 10*3/uL (ref 0.1–1.0)
Monocytes Relative: 6 % (ref 3–12)
Neutro Abs: 8.5 10*3/uL — ABNORMAL HIGH (ref 1.7–7.7)

## 2010-07-24 LAB — CBC
Platelets: 262 10*3/uL (ref 150–400)
RDW: 14.3 % (ref 11.5–15.5)
WBC: 11.1 10*3/uL — ABNORMAL HIGH (ref 4.0–10.5)

## 2010-07-24 LAB — AMYLASE: Amylase: 24 U/L (ref 0–105)

## 2010-07-26 ENCOUNTER — Emergency Department (HOSPITAL_COMMUNITY): Payer: Medicaid Other

## 2010-07-26 ENCOUNTER — Emergency Department (HOSPITAL_COMMUNITY)
Admission: EM | Admit: 2010-07-26 | Discharge: 2010-07-26 | Disposition: A | Discharge status: Home / Self Care | Payer: Medicaid Other | Attending: Emergency Medicine | Admitting: Emergency Medicine

## 2010-07-26 DIAGNOSIS — E669 Obesity, unspecified: Secondary | ICD-10-CM | POA: Insufficient documentation

## 2010-07-26 DIAGNOSIS — K219 Gastro-esophageal reflux disease without esophagitis: Secondary | ICD-10-CM | POA: Insufficient documentation

## 2010-07-26 DIAGNOSIS — S5010XA Contusion of unspecified forearm, initial encounter: Secondary | ICD-10-CM | POA: Insufficient documentation

## 2010-07-26 DIAGNOSIS — Z79899 Other long term (current) drug therapy: Secondary | ICD-10-CM | POA: Insufficient documentation

## 2010-07-26 DIAGNOSIS — F341 Dysthymic disorder: Secondary | ICD-10-CM | POA: Insufficient documentation

## 2010-07-26 DIAGNOSIS — X58XXXA Exposure to other specified factors, initial encounter: Secondary | ICD-10-CM | POA: Insufficient documentation

## 2010-07-26 DIAGNOSIS — F17200 Nicotine dependence, unspecified, uncomplicated: Secondary | ICD-10-CM | POA: Insufficient documentation

## 2010-07-26 DIAGNOSIS — I10 Essential (primary) hypertension: Secondary | ICD-10-CM | POA: Insufficient documentation

## 2010-07-26 DIAGNOSIS — M779 Enthesopathy, unspecified: Secondary | ICD-10-CM | POA: Insufficient documentation

## 2010-08-09 LAB — URINALYSIS, ROUTINE W REFLEX MICROSCOPIC
Bilirubin Urine: NEGATIVE
Glucose, UA: NEGATIVE mg/dL
Protein, ur: NEGATIVE mg/dL

## 2010-08-09 LAB — URINE MICROSCOPIC-ADD ON

## 2010-08-09 LAB — COMPREHENSIVE METABOLIC PANEL
ALT: 32 U/L (ref 0–53)
Alkaline Phosphatase: 103 U/L (ref 39–117)
CO2: 26 mEq/L (ref 19–32)
GFR calc non Af Amer: >60 mL/min (ref >60)
Glucose, Bld: 105 mg/dL — ABNORMAL HIGH (ref 70–99)
Potassium: 3.7 mEq/L (ref 3.5–5.1)
Sodium: 137 mEq/L (ref 135–145)

## 2010-08-09 LAB — DIFFERENTIAL
Basophils Absolute: 0 10*3/uL (ref 0.0–0.1)
Basophils Relative: 0 % (ref 0–1)
Eosinophils Absolute: 0.1 10*3/uL (ref 0.0–0.7)
Neutrophils Relative %: 70 % (ref 43–77)

## 2010-08-09 LAB — CBC
HCT: 45.1 % (ref 39.0–52.0)
Hemoglobin: 15.6 g/dL (ref 13.0–17.0)
MCHC: 34.6 g/dL (ref 30.0–36.0)
RBC: 4.82 MIL/uL (ref 4.22–5.81)

## 2010-08-09 LAB — LIPASE, BLOOD: Lipase: 15 U/L (ref 11–59)

## 2010-08-10 LAB — CBC
MCHC: 34.8 g/dL (ref 30.0–36.0)
MCHC: 35.8 g/dL (ref 30.0–36.0)
MCV: 93.4 fL (ref 78.0–100.0)
MCV: 93.8 fL (ref 78.0–100.0)
Platelets: 212 10*3/uL (ref 150–400)
RBC: 4.23 MIL/uL (ref 4.22–5.81)
RBC: 4.91 MIL/uL (ref 4.22–5.81)
RDW: 12.9 % (ref 11.5–15.5)
WBC: 8.6 10*3/uL (ref 4.0–10.5)
WBC: 8.7 10*3/uL (ref 4.0–10.5)
WBC: 8.6 10*3/uL (ref 4.0–10.5)

## 2010-08-10 LAB — DIFFERENTIAL
Eosinophils Absolute: 0.2 10*3/uL (ref 0.0–0.7)
Eosinophils Relative: 2 % (ref 0–5)
Lymphocytes Relative: 35 % (ref 12–46)
Lymphs Abs: 2.4 10*3/uL (ref 0.7–4.0)
Lymphs Abs: 3 10*3/uL (ref 0.7–4.0)
Monocytes Absolute: 0.6 10*3/uL (ref 0.1–1.0)
Monocytes Relative: 6 % (ref 3–12)
Neutro Abs: 4.8 10*3/uL (ref 1.7–7.7)
Neutrophils Relative %: 63 % (ref 43–77)

## 2010-08-10 LAB — BASIC METABOLIC PANEL
CO2: 24 mEq/L (ref 19–32)
Calcium: 9.5 mg/dL (ref 8.4–10.5)
Creatinine, Ser: 0.81 mg/dL (ref 0.4–1.5)
GFR calc Af Amer: >60 mL/min (ref >60)

## 2010-08-10 LAB — COMPREHENSIVE METABOLIC PANEL
AST: 16 U/L (ref 0–37)
Albumin: 4.1 g/dL (ref 3.5–5.2)
Calcium: 9.2 mg/dL (ref 8.4–10.5)
Creatinine, Ser: 0.98 mg/dL (ref 0.4–1.5)
GFR calc Af Amer: >60 mL/min (ref >60)
Total Protein: 6.7 g/dL (ref 6.0–8.3)

## 2010-08-10 LAB — HEPATIC FUNCTION PANEL
ALT: 15 U/L (ref 0–53)
AST: 16 U/L (ref 0–37)
Albumin: 4 g/dL (ref 3.5–5.2)
Alkaline Phosphatase: 74 U/L (ref 39–117)
Total Bilirubin: 0.4 mg/dL (ref 0.3–1.2)

## 2010-08-13 LAB — URINALYSIS, ROUTINE W REFLEX MICROSCOPIC
Hgb urine dipstick: NEGATIVE
Nitrite: NEGATIVE
Specific Gravity, Urine: 1.025 (ref 1.005–1.030)
Urobilinogen, UA: 0.2 mg/dL (ref 0.0–1.0)
pH: 5.5 (ref 5.0–8.0)

## 2010-08-13 LAB — COMPREHENSIVE METABOLIC PANEL
ALT: 18 U/L (ref 0–53)
Alkaline Phosphatase: 67 U/L (ref 39–117)
BUN: 14 mg/dL (ref 6–23)
CO2: 23 mEq/L (ref 19–32)
GFR calc non Af Amer: >60 mL/min (ref >60)
Glucose, Bld: 114 mg/dL — ABNORMAL HIGH (ref 70–99)
Potassium: 4.4 mEq/L (ref 3.5–5.1)
Total Protein: 7.8 g/dL (ref 6.0–8.3)

## 2010-08-13 LAB — CBC
HCT: 49.5 % (ref 39.0–52.0)
Hemoglobin: 16.7 g/dL (ref 13.0–17.0)
MCHC: 33.8 g/dL (ref 30.0–36.0)
RBC: 5.12 MIL/uL (ref 4.22–5.81)
RDW: 13.4 % (ref 11.5–15.5)

## 2010-08-13 LAB — DIFFERENTIAL
Basophils Absolute: 0 10*3/uL (ref 0.0–0.1)
Basophils Relative: 0 % (ref 0–1)
Eosinophils Absolute: 0.2 10*3/uL (ref 0.0–0.7)
Monocytes Relative: 7 % (ref 3–12)
Neutro Abs: 7 10*3/uL (ref 1.7–7.7)
Neutrophils Relative %: 71 % (ref 43–77)

## 2010-08-13 LAB — URINE CULTURE

## 2010-08-25 ENCOUNTER — Emergency Department (HOSPITAL_COMMUNITY)
Admission: EM | Admit: 2010-08-25 | Discharge: 2010-08-26 | Disposition: A | Discharge status: Home / Self Care | Payer: Medicaid Other | Attending: Emergency Medicine | Admitting: Emergency Medicine

## 2010-08-25 ENCOUNTER — Emergency Department (HOSPITAL_COMMUNITY): Payer: Medicaid Other

## 2010-08-25 DIAGNOSIS — F3289 Other specified depressive episodes: Secondary | ICD-10-CM | POA: Insufficient documentation

## 2010-08-25 DIAGNOSIS — K439 Ventral hernia without obstruction or gangrene: Secondary | ICD-10-CM | POA: Insufficient documentation

## 2010-08-25 DIAGNOSIS — I10 Essential (primary) hypertension: Secondary | ICD-10-CM | POA: Insufficient documentation

## 2010-08-25 DIAGNOSIS — Z79899 Other long term (current) drug therapy: Secondary | ICD-10-CM | POA: Insufficient documentation

## 2010-08-25 DIAGNOSIS — K279 Peptic ulcer, site unspecified, unspecified as acute or chronic, without hemorrhage or perforation: Secondary | ICD-10-CM | POA: Insufficient documentation

## 2010-08-25 DIAGNOSIS — K219 Gastro-esophageal reflux disease without esophagitis: Secondary | ICD-10-CM | POA: Insufficient documentation

## 2010-08-25 DIAGNOSIS — F411 Generalized anxiety disorder: Secondary | ICD-10-CM | POA: Insufficient documentation

## 2010-08-25 DIAGNOSIS — R112 Nausea with vomiting, unspecified: Secondary | ICD-10-CM | POA: Insufficient documentation

## 2010-08-25 DIAGNOSIS — R109 Unspecified abdominal pain: Secondary | ICD-10-CM | POA: Insufficient documentation

## 2010-08-25 DIAGNOSIS — F329 Major depressive disorder, single episode, unspecified: Secondary | ICD-10-CM | POA: Insufficient documentation

## 2010-08-25 LAB — DIFFERENTIAL
Eosinophils Absolute: 0.2 10*3/uL (ref 0.0–0.7)
Eosinophils Relative: 2 % (ref 0–5)
Lymphs Abs: 3.1 10*3/uL (ref 0.7–4.0)
Monocytes Relative: 9 % (ref 3–12)

## 2010-08-25 LAB — CBC
MCH: 31.6 pg (ref 26.0–34.0)
MCHC: 33.9 g/dL (ref 30.0–36.0)
MCV: 93.3 fL (ref 78.0–100.0)
Platelets: 226 10*3/uL (ref 150–400)
RDW: 13.2 % (ref 11.5–15.5)

## 2010-08-25 LAB — COMPREHENSIVE METABOLIC PANEL
Albumin: 4.6 g/dL (ref 3.5–5.2)
BUN: 10 mg/dL (ref 6–23)
Creatinine, Ser: 1.02 mg/dL (ref 0.4–1.5)
Total Bilirubin: 1.3 mg/dL — ABNORMAL HIGH (ref 0.3–1.2)
Total Protein: 7.4 g/dL (ref 6.0–8.3)

## 2010-08-25 MED ORDER — IOHEXOL 300 MG/ML  SOLN
100.0000 mL | Freq: Once | INTRAMUSCULAR | Status: AC | PRN
  Administered 2010-08-25: 100 mL via INTRAVENOUS

## 2010-08-29 ENCOUNTER — Inpatient Hospital Stay (HOSPITAL_COMMUNITY)
Admission: EM | Admit: 2010-08-29 | Discharge: 2010-08-31 | DRG: 355 | Disposition: A | Discharge status: Home / Self Care | Payer: Medicaid Other | Attending: General Surgery | Admitting: General Surgery

## 2010-08-29 DIAGNOSIS — K219 Gastro-esophageal reflux disease without esophagitis: Secondary | ICD-10-CM | POA: Diagnosis present

## 2010-08-29 DIAGNOSIS — F411 Generalized anxiety disorder: Secondary | ICD-10-CM | POA: Diagnosis present

## 2010-08-29 DIAGNOSIS — K432 Incisional hernia without obstruction or gangrene: Principal | ICD-10-CM | POA: Diagnosis present

## 2010-08-29 DIAGNOSIS — I10 Essential (primary) hypertension: Secondary | ICD-10-CM | POA: Diagnosis present

## 2010-08-29 LAB — BASIC METABOLIC PANEL
CO2: 26 mEq/L (ref 19–32)
Calcium: 9.3 mg/dL (ref 8.4–10.5)
Chloride: 102 mEq/L (ref 96–112)
Glucose, Bld: 106 mg/dL — ABNORMAL HIGH (ref 70–99)
Sodium: 135 mEq/L (ref 135–145)

## 2010-08-29 LAB — DIFFERENTIAL
Basophils Absolute: 0 10*3/uL (ref 0.0–0.1)
Lymphocytes Relative: 31 % (ref 12–46)
Monocytes Absolute: 0.6 10*3/uL (ref 0.1–1.0)
Monocytes Relative: 8 % (ref 3–12)
Neutro Abs: 4.1 10*3/uL (ref 1.7–7.7)

## 2010-08-29 LAB — CBC
HCT: 48.9 % (ref 39.0–52.0)
Hemoglobin: 16.6 g/dL (ref 13.0–17.0)
MCHC: 33.9 g/dL (ref 30.0–36.0)

## 2010-09-01 NOTE — Discharge Summary (Signed)
  NAMEELIOTT, [name]               ACCOUNT NO.:  [id number]  MEDICAL RECORD NO.:  [id number]           PATIENT TYPE:  I  LOCATION:  A202                          FACILITY:  [facility]  PHYSICIAN:  Dalia Heading, M.D.  DATE OF BIRTH:  03-22-1981  DATE OF ADMISSION:  08/29/2010 DATE OF DISCHARGE:  04/27/2012LH                              DISCHARGE SUMMARY   HOSPITAL COURSE SUMMARY:  The patient is a 30 year old obese white male who presented with a 4-day history of worsening supraumbilical abdominal pain associated with the swelling.  Surgery was consulted and the patient was noted to have a reducible supraumbilical hernia.  It is felt that this was secondary to a previous laparoscopic cholecystectomy.  He was admitted to the hospital on the following day, underwent incisional herniorrhaphy with mesh.  He tolerated the procedure well. Postoperative course was for the most part unremarkable.  His diet was advanced without difficulty.  His nausea did resolve significantly.  The patient is being discharged home on postoperative day #1 in good improving condition.  DISCHARGE INSTRUCTIONS:  The patient is to follow up with Dr. Franky Macho on Sep 06, 2010.  DISCHARGE MEDICATIONS: 1. Percocet 1-2 tablets p.o. q.4 h. p.r.n. pain. 2. Lisinopril 10 mg p.o. daily. 3. Carafate 1 gram p.o. q.6 h. p.r.n. reflux. 4. Omeprazole 40 mg p.o. b.i.d. 5. Xanax 1 mg p.o. q.6 h. p.r.n.  PRINCIPAL DIAGNOSES: 1. Incisional hernia. 2. Anxiety. 3. Gastroesophageal reflux disease. 4. Hypertension.  PRINCIPAL PROCEDURE:  Incisional herniorrhaphy with mesh on August 30, 2010.     Dalia Heading, M.D.    MAJ/MEDQ  D:  08/31/2010  T:  09/01/2010  Job:  [id number]  Electronically Signed by Franky Macho M.D. on 09/01/2010 08:08:44 AM

## 2010-09-01 NOTE — Op Note (Signed)
  [name], [name]               ACCOUNT NO.:  [id number]  MEDICAL RECORD NO.:  [id number]           PATIENT TYPE:  I  LOCATION:  A202                          FACILITY:  [facility]  PHYSICIAN:  Dalia Heading, M.D.  DATE OF BIRTH:  [birth year]  DATE OF PROCEDURE:  08/29/2010 DATE OF DISCHARGE:                              OPERATIVE REPORT   PREOPERATIVE DIAGNOSIS:  Incisional hernia.  POSTOPERATIVE DIAGNOSIS:  Incisional hernia.  PROCEDURE:  Incisional herniorrhaphy with mesh.  SURGEON:  Dr. Franky Macho  ANESTHESIA:  General endotracheal.  INDICATIONS:  The patient is a 30 year old white male who presents with an incisional hernia just above the umbilicus.  On CT scan of the abdomen, it just contains fat.  It is reducible.  He also on CAT scan had a smaller incisional hernia at the level of the umbilicus.  The patient comes the operating for an incisional herniorrhaphy with mesh. The risks and benefits of the procedure including bleeding, infection, pain, possibly recurrence of the hernia were fully explained to the patient, gave informed consent.  PROCEDURE NOTE:  The patient was placed in supine position.  After induction of general endotracheal anesthesia, the abdomen was prepped and draped in the usual sterile technique with DuraPrep.  Surgical site confirmation was performed.  A midline incision was made just above the umbilicus.  Dissection was taken down to the hernia.  The hernia contents were again reduced.  The hernia sac was excised.  Only omentum was noted within the hernia sac. On inspection of the anterior abdominal wall, a small hernia where a previous trocar site from the laparoscopic cholecystectomy was noted at the umbilical level.  This was closed primarily using an 0 Ethibond interrupted suture.  Next, a Proceed ventral patch, 6.4 cm in size, was inserted against the anterior abdominal wall and secured to the fascia using 0 Ethibond interrupted sutures.   The fascia was closed transversely over this mesh repair using 0 Ethibond interrupted sutures. The subcutaneous layer was reapproximated using a 2-0 Vicryl interrupted suture.  The skin was closed using staples.  Sensorcaine 0.5% was instilled into the surrounding wound.  Betadine ointment and dry sterile dressing were applied.  All tape and needle counts were correct at the end of the procedure. The patient was extubated in the operating room and went back to recovery room in awake and stable condition.  COMPLICATIONS:  None.  SPECIMEN:  None.  BLOOD LOSS:  Minimal.     Dalia Heading, M.D.     MAJ/MEDQ  D:  08/30/2010  T:  08/31/2010  Job:  [id number]  Electronically Signed by Franky Macho M.D. on 09/01/2010 04:54:09 AM

## 2010-09-01 NOTE — H&P (Signed)
  NAMEFIDENCIO, DUDDY               ACCOUNT NO.:  [id number]  MEDICAL RECORD NO.:  [id number]           PATIENT TYPE:  I  LOCATION:  A302                          FACILITY:  [facility]  PHYSICIAN:  Dalia Heading, M.D.  DATE OF BIRTH:  26-Apr-1981  DATE OF ADMISSION:  08/29/2010 DATE OF DISCHARGE:  LH                             HISTORY & PHYSICAL   CHIEF COMPLAINT:  Abdominal pain from hernia.  HISTORY OF PRESENT ILLNESS:  The patient is a 30 year old morbidly obese white male who presents with a 4-day history of worsening supraumbilical abdominal pain associated with a swelling.  He was seen in the emergency room on August 25, 2010, and was told that he had a hernia and was to follow up with another Careers adviser.  He was referred to the emergency room today by that surgeon for evaluation by the surgeon on-call.  The patient states that he fell recently while skating with his daughter and that is when the swelling had worsened.  He did have an episode of hematemesis in the past, though he has had a longstanding history of gastroesophageal reflux disease.  PAST MEDICAL HISTORY:  GERD, hypertension, morbid obesity and anxiety.  PAST SURGICAL HISTORY:  Laparoscopic cholecystectomy, inguinal herniorrhaphy as a child, appendectomy and multiple endoscopies.  CURRENT MEDICATIONS: 1. Lisinopril 10 mg p.o. daily. 2. Xanax p.r.n. anxiety. 3. Omeprazole daily. 4. Carafate daily. 5. Vicodin p.r.n. pain.  ALLERGIES:  No known drug allergies.  REVIEW OF SYSTEMS:  The patient is a current smoker.  He denies drinking or illicit drug use.  FAMILY MEDICAL HISTORY:  Noncontributory.  PHYSICAL EXAMINATION:  GENERAL:  The patient is a morbidly obese white male with some anxiety secondary to his abdominal pain. HEENT:  Unremarkable. LUNGS:  Clear to auscultation with equal breath sounds bilaterally. HEART:  Regular rate and rhythm without S3, S4 or murmurs. ABDOMEN:  Soft with a supraumbilical  swelling present just below a surgical scar.  This hernia is reducible and did relieve some of his pain.  It does not appear to be incarcerated.  No hepatosplenomegaly or masses are noted.  BMET is within normal limits.  White blood cell count 7.0, hemoglobin 16.6, hematocrit 48.9 and platelet count 196.  CT scan of the abdomen and pelvis was done on August 25, 2010, which did reveal the hernia which contained only fats.  There was also the question of a smaller umbilical hernia just below the supraumbilical hernia.  IMPRESSION:  Incisional hernia.  PLAN:  The patient will undergo an incisional herniorrhaphy with mesh on August 30, 2010.  The risks and benefits of the procedure including bleeding, infection and recurrence of the hernia were fully explained to the patient, gave informed consent.     Dalia Heading, M.D.     MAJ/MEDQ  D:  08/29/2010  T:  08/30/2010  Job:  [id number]  Electronically Signed by Franky Macho M.D. on 09/01/2010 08:08:39 AM

## 2010-09-18 NOTE — Op Note (Signed)
[name], [name]               ACCOUNT NO.:  [id number]   MEDICAL RECORD NO.:  [id number]          PATIENT TYPE:  AMB   LOCATION:  DAY                           FACILITY:  [facility]   PHYSICIAN:  Kassie Mends, M.D.      DATE OF BIRTH:  11-23-1980   DATE OF PROCEDURE:  DATE OF DISCHARGE:                               OPERATIVE REPORT   REFERRING PHYSICIAN:  Francoise Schaumann. Halm, DO, FAAP   PROCEDURE:  Esophagogastroduodenoscopy with cold forceps biopsy of the  gastric mucosa and Bravo capsule placement 34 cm from the teeth.   INDICATION FOR EXAMINATION:  [name] is a 30 year old male who  complains of uncontrolled reflux on Prilosec and Kapidex.  He also says  his Nexium did not help. He also complains of vomiting every morning.  He complains of liquid bubbling up in the middle of the night causing to  have just throw up.   FINDINGS:  1. A 4-mm sessile esophageal polyp removed via cold forceps.      Otherwise, no evidence of Barrett mass, erosions, ulcerations, or      strictures.  2. Patchy erythema in the antrum without erosion or ulceration.      Biopsies obtained via cold forceps to evaluate for H. pylori      gastritis.  3. Normal duodenal bulb and second portion of the duodenum.   DIAGNOSES:  1. No evidence of erosive esophagitis.  2. Moderate gastritis.  3. Esophageal polyp.   RECOMMENDATIONS:  1. He should stop taking Advil, ibuprofen, aspirin, and other anti-      inflammatory drugs for 30 days.  2. He is given the [facility] handout on lifestyle recommendations      for patients with gastroesophageal reflux disease.  He is asked to      follow them.  He was specifically asked to stop smoking 1-pack a      day.  3. Will stop the Kapadex and the Nexium and ask to take Prilosec 30      minutes prior to his first and his last meal.  If his Bravo study      shows uncontrolled gastroesophageal reflux disease then we will      refer for Nissen fundoplication.  If his  biopsies show H. pylori      gastritis then he will be treated.  Otherwise, he most likely has      non-ulcer dyspepsia.  4. He should follow a low-fat diet.  He is given a handout on a low-      fat diet.  He is also asked to avoid gastric irritants.  He is      given a handout on gastric irritants and gastritis.   MEDICATIONS:  MAC provided by anesthesia.   PROCEDURE TECHNIQUE:  Physical exam was performed.  Informed consent was  obtained from the patient after explaining the benefits, risks, and  alternatives to the procedure.  The patient was connected to monitor and  placed in left lateral position.  Continuous oxygen was provided by  nasal cannula.  IV medicine administered through an  indwelling cannula.  After administration of sedation, the patient's esophagus was intubated  and the scope was advanced under direct visualization to the second  portion of the duodenum.  The scope was withdrawn into the stomach.  Cold forceps biopsies were obtained.  Retroflexed view of the cardia  revealed a normal Z-line.  The GE junction was identified 40 cm from the  teeth.  The scope was withdrawn slowly by carefully examining color,  texture, anatomy, and integrity of the mucosa on the way out.  The Bravo  capsule was introduced to 34 cm from the teeth.  Suction was applied for  1 minute.  The patient's esophagus was again intubated with the  diagnostic gastroscope and was advanced to the Bravo capsule and  placement on the sidewall of the esophagus was confirmed.  The  introducer and the diagnostic gastroscope were removed.  The patient was  recovered in endoscopy and discharged home in satisfactory condition.   PATH:  Gastritis. Benign esophageal polyp.      Kassie Mends, M.D.  Electronically Signed     SM/MEDQ  D:  03/23/2008  T:  03/24/2008  Job:  [id number]   cc:   Francoise Schaumann. Milford Cage DO, FAAP  Fax: [phone]

## 2010-09-18 NOTE — Consult Note (Signed)
[name], [name]               ACCOUNT NO.:  [id number]   MEDICAL RECORD NO.:  [id number]          PATIENT TYPE:  AMB   LOCATION:  DAY                           FACILITY:  [facility]   PHYSICIAN:  R. Roetta Sessions, M.D. DATE OF BIRTH:  1980/08/03   DATE OF CONSULTATION:  03/11/2008  DATE OF DISCHARGE:                                 CONSULTATION   REASON FOR CONSULTATION:  Acid reflux, abdominal pain, and vomiting  blood.   REQUESTING PHYSICIAN:  Jerolyn Shin, nurse practitioner with Dr.  Rosalio Macadamia.   PHYSICIAN CO-SIGNING NOTE:  Kassie Mends, MD   HISTORY OF PRESENT ILLNESS:  The patient is a 30 year old Caucasian  gentleman who presents for further evaluation of above-stated symptoms.  He was seen in this practice back in 2008 with complaints of chronic  GERD refractory to therapy.  At that time, he also had nausea, vomiting,  and hematemesis.  He was actually an inpatient.  EGD by Dr. Cira Servant  revealed erosive reflux esophagitis and erosive antral gastritis.  H.  pylori serologies were negative.  He states he has tried multiple over-  the-counter agents as well as prescription medications for reflux.  A  lot of times, they do work initially, but then seems to wear off.  He  has been on Nexium and Prilosec previously.  Otherwise, he cannot recall  the other ones.  Currently, he was given some Kapidex samples, but he  says these do not seem to be helping either.  He complains of severe  burning in his chest and epigastrium.  This is with and without meals.  Nocturnally, he has a lot of burning.  He has frequent vomiting related  to this.  He generally vomits 6-7 times a day.  He started to see some  fresh blood in his emesis.  He denies odynophagia or dysphagia.  He  states the stools have been darker, have been not black.  Denies any  blood in the stool, constipation, and diarrhea.  Denies any dysuria or  hematuria.  He has gained about 25 pounds in the last couple of months.  He thinks this is related to one of his blood pressure pills.  He does  complain of some chronic hoarseness and sore throat.  Denies any cough,  congestion, or shortness of breath.   CURRENT MEDICATIONS:  1. Kapidex 60 mg daily.  2. Claritin 10 mg daily.  3. Astepro nasal spray.  4. Xanax 1 mg t.i.d.  5. Prozac 40 mg daily.  6. Lisinopril 10 mg daily.  7. Propranolol 80 mg b.i.d.   ALLERGIES:  No known drug allergies.   PAST MEDICAL HISTORY:  1. Hypertension.  2. Depression.  3. Anxiety.  4. GERD.  5. EGD as above.  6. History of MRSA.  7. Right shoulder pain.  8. He has had knee surgery.   FAMILY HISTORY:  Negative for colorectal cancer.  Both parents does have  problems with reflux.   SOCIAL HISTORY:  He is single, he has 2 children, and he is unemployed.  He smokes about 1 pack cigarettes daily.  He states he is not able to  drink any alcohol because of these symptoms.  He says that he smokes  marijuana once daily and has done this more recently because of his GI  symptoms back in 2008, he immediately smoked marijuana occasionally.   REVIEW OF SYSTEMS:  See HPI for GI.  CONSTITUTIONAL:  See HPI.  CARDIOPULMONARY:  See HPI.  GENITOURINARY:  Denies dysuria or hematuria.   PHYSICAL EXAMINATION:  VITAL SIGNS:  Weight 256, height 5 feet 8 inches,  temp 98, blood pressure 150/98, and pulse 80.  GENERAL:  Pleasant, obese, Caucasian gentleman in no acute distress.  SKIN:  Warm and dry.  No jaundice.  HEENT:  Sclerae nonicteric.  Oropharyngeal mucosa moist and pink.  No  lesions, erythema, or exudate.  No lymphadenopathy or thyromegaly.  CHEST:  Lungs were clear to auscultation.  CARDIAC:  Regular rate and rhythm.  Normal S1 and S2.  No murmurs, rubs,  or gallops.  ABDOMEN:  Positive bowel sounds.  Abdomen is soft, obese with  symmetrical, moderate epigastric tenderness to deep palpation, some  tenderness under both upper quadrants as well.  No rebound or guarding.  No  organomegaly or masses.  No abdominal bruits or hernias.  LOWER EXTREMITIES:  No edema.   IMPRESSION:  Mr. Casasola is a 30 year old gentleman who presents with  ongoing epigastric discomfort associated with nausea and vomiting and  now hematemesis.  He has refractory reflux symptoms as well.  He states  he is usually been suffering with this for about 4 years, but has been  worse more recently.  He had erosive reflux esophagitis on his  esophagogastroduodenoscopy in January 2008.  He started various PPIs and  over-the-counter agents as outlined above.  His symptoms were still  refractory.  Recommend esophagogastroduodenoscopy for further evaluation  of symptoms.  He likely has complicated gastroesophageal reflux disease  .  He also smokes marijuana on regular basis, which may be adding to his  recurrent vomiting.  He also takes 3 or 4 hot showers daily, which helps  with his GI symptoms as well.  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   PLAN:  1. EGD with Dr. Cira Servant in the near future.  2. Antireflux measures.  3. Nexium 40 mg b.i.d., #30 samples provided.  4. Recommend marijuana cessation.  5. Further recommendations to follow.      Tana Coast, P.AJonathon Bellows, M.D.  Electronically Signed    LL/MEDQ  D:  03/21/2008  T:  03/22/2008  Job:  [id number]   cc:   Rosalio Macadamia  Fax: [phone]   Jerolyn Shin

## 2010-09-18 NOTE — Op Note (Signed)
NAMEJAYKE, CAUL               ACCOUNT NO.:  [id number]   MEDICAL RECORD NO.:  [id number]          PATIENT TYPE:  AMB   LOCATION:  DAY                           FACILITY:  [facility]   PHYSICIAN:  Kassie Mends, M.D.      DATE OF BIRTH:  1980/07/17   DATE OF PROCEDURE:  DATE OF DISCHARGE:  03/23/2008                               OPERATIVE REPORT   REFERRING PHYSICIAN:  Francoise Schaumann. Halm, DO, FAAP   PROCEDURE:  A 48-hour Bravo capsule study.   INDICATION FOR EXAMINATION:  Mr. Lisenby is a 30 year old male who  complains of chronic gastroesophageal reflux disease, which he reports  is refractory to therapy.  He was taking Capadex as well as omeprazole.  The study is being done on omeprazole 40 mg twice a day.  He had an  upper endoscopy on March 23, 2008, when the Bravo capsule was placed.  Mr. Chisolm failed to return the recorder until April 07, 2008.  He  reports that he did not operate the box properly because his family  apparently did not inform him of how to properly use the box.  He did  have an upper endoscopy in November, which showed reactive gastropathy.   FINDINGS:  On day 1, the duration of the study was 23 hours.  He had 8  episodes of reflux.  Three episodes did last greater than 5 minutes.  The longest reflux was 26 minutes.  He had 55 minutes with pH less than  4.  On day 1, his DeMeester score was 11.5 (normal less than 14.72).  On  day 2, the study period lasted 14 hours and 38 minutes.  He had 29  episodes of reflux.  One episode lasted greater than 5 minutes.  The  longest duration of reflux was 6 minutes.  It has been 31 minutes with a  pH less than 4.  His DeMeester score on day 2 was 10.2 (normal less than  14.72). His symptom association probability is likely inaccurate due to  this fact.  However, the symptom association probability was 62.2 with  regurgitation and 51.2 with chest pain.   ASSESSMENT:  Mr. Campoy is a 30 year old male who does have episodes  of  breakthrough reflux.  However, the acid exposure in his esophagus is  within acceptable range and the Prilosec 40 mg b.i.d. causes adequate  acid suppression.   RECOMMENDATIONS:  1. Recommend Mr. Capek continue his Prilosec at 40 mg b.i.d.  2. Could consider adding a tricyclic antidepressant or SSRI for more      adequate pain control.      Kassie Mends, M.D.  Electronically Signed     SM/MEDQ  D:  04/08/2008  T:  04/09/2008  Job:  [id number]   cc:   Francoise Schaumann. Milford Cage DO, FAAP  Fax: [phone]

## 2010-09-21 NOTE — Procedures (Signed)
NAMEHONG, MORING               ACCOUNT NO.:  [id number]   MEDICAL RECORD NO.:  [id number]          PATIENT TYPE:  EMS   LOCATION:  ED                            FACILITY:  [facility]   PHYSICIAN:  [place] Bing, M.D.  DATE OF BIRTH:  [birth year]   DATE OF PROCEDURE:  DATE OF DISCHARGE:  02/19/2004                                EKG INTERPRETATION   FINDINGS:  1.  Normal sinus rhythm.  2.  Nonspecific ST-T wave abnormality.     [place]   RR/MEDQ  D:  02/21/2004  T:  02/21/2004  Job:  [id number]

## 2010-09-21 NOTE — Discharge Summary (Signed)
[name], [name]               ACCOUNT NO.:  [id number]   MEDICAL RECORD NO.:  [id number]          PATIENT TYPE:  INP   LOCATION:  A211                          FACILITY:  [facility]   PHYSICIAN:  Scott A. Gerda Diss, MD    DATE OF BIRTH:  20-Feb-1981   DATE OF ADMISSION:  05/10/2006  DATE OF DISCHARGE:  01/07/2008LH                               DISCHARGE SUMMARY   DISCHARGE DIAGNOSES:  1. Chest pain.  2. Reflux.  3. Hematemesis.   HOSPITAL COURSE:  The patient was admitted in after having some  significant severe chest pain and discomfort. States the pain was  unbearable. The ER tried to treat him and was unable to successfully  treat him as an outpatient. He vomited and had some blood, and they  called [place]. We admitted him. We did not feel that the patient was having  heart disease, but we did do telemetry and did enzymes overnight, and  these were negative, and he did have consultation with GI, and they saw  him and went ahead and did an EGD on January 7, and it showed erosive  esophagitis. The patient later that day was improved to the point of  being able to be sent home. He was sent home on Vicodin as well as a PPI  b.i.d. for a good 4 to 8 weeks and then once daily thereafter.  Helicobacter pylori was checked by GI but was not available at the time  of discharge. He was to follow up in the course of the next 7 to 14 days  with Dr. Lubertha South and to follow up with Dr. Karilyn Cota also if any  particular problems.      Scott A. Gerda Diss, MD  Electronically Signed     SAL/MEDQ  D:  06/06/2006  T:  06/06/2006  Job:  [id number]   cc:   Lorin Picket A. Gerda Diss, MD  Fax: [phone]

## 2010-09-21 NOTE — Procedures (Signed)
NAMEBENJERMAN, MOLINELLI               ACCOUNT NO.:  [id number]   MEDICAL RECORD NO.:  [id number]           PATIENT TYPE:   LOCATION:                                FACILITY:  [facility]   PHYSICIAN:  Donna Bernard, M.D.DATE OF BIRTH:  08/15/1980   DATE OF PROCEDURE:  DATE OF DISCHARGE:                                EKG INTERPRETATION   EKG INTERPRETATION:  Electrocardiogram reveals sinus bradycardia with no  significant STT changes.       ___________________________________________  Lacretia Nicks. Simone Curia, M.D.    Karie Chimera  D:  02/10/2004  T:  02/10/2004  Job:  [id number]

## 2010-09-21 NOTE — Consult Note (Signed)
NAMECLOYD, [name]               ACCOUNT NO.:  [id number]   MEDICAL RECORD NO.:  [id number]          PATIENT TYPE:  INP   LOCATION:  A211                          FACILITY:  [facility]   PHYSICIAN:  Lionel December, M.D.    DATE OF BIRTH:  1981/01/21   DATE OF CONSULTATION:  DATE OF DISCHARGE:                                 CONSULTATION   REASON FOR CONSULTATION:  Hematemesis and severe reflux.   HISTORY OF PRESENT ILLNESS:  The patient is a 30 year old Caucasian male  with history of chronic reflux for several years.  He has been taking  over-the-counter Pepcid and Nexium samples intermittently.  Saturday  morning about 5 a.m. he woke up with chest pain, coughing, nausea,  followed by hematemesis and epigastric pain.  He describes  the pain as  a 10 on pain scale.  He has severe daily heart burn and indigestion.  He  denies any dysphagia but has had occasional regurgitation within minutes  of eating.  He does have odynphagia with burning sensation.  He also has  water brash.  He complains of anorexia.  His bowel movements have been  soft and brown, denies any melena or rectal bleeding.  He has never had  an EGD.   PAST MEDICAL/SURGICAL HISTORY:  Chronic GERD, left inguinal  herniorrhaphy.   MEDICATIONS:  Prior to admission, Xanax 0.5 mg as needed, Vicodin as  needed, over-the-counter antacids as needed.   ALLERGIES:  No known drug allergies.   FAMILY HISTORY:  Mother is in her 3s and healthy.  Father in his 21s,  has history of GERD.  He has 1 healthy sister.   SOCIAL HISTORY:  The patient is single.  He has joint custody of his 1  healthy daughter.  He is unemployed.  He consumes alcoholic beverages  once or twice every 2 weeks.  He does use marijuana occasionally.  He  has a 6-pack year history of tobacco use.   REVIEW OF SYSTEMS:  Constitutional:  Weight is stable.  He has had low-  grade temperature at home per his report.  Denies any fatigue.  Does  have some chest pain.   See history of present illness.  Denies any  palpitations.  Denies any shortness of breath.  He does have some cough  and wheezing.  GI:  See history of present illness.  No constipation or  diarrhea.   PHYSICAL EXAMINATION:  Vital signs:  Temperature 97.4, pulse 74,  respirations 18, blood pressure 142/75, O2 sat is 97% on room air.  General:  The patient is a well-developed, well-nourished Caucasian male  in no acute distress.  He is obese.  He is alert and oriented.  His skin  is pink, warm and dry without any rash or jaundice.  HEENT:  Conjunctivae clear, throat not injected, oropharynx pink and moist  without any lesions.  Neck:  Supple without any mass or thyromegaly.  Chest:  Heart regular rate and rhythm, normal S1 and S2 without murmur,  rub, or gallop.  Lungs:  He does have expiratory wheezes bilaterally.  No acute distress.  Abdomen:  Obese, protuberant with positive bowel  sounds x 4.  No bruits auscultated, soft,  nondistended.  He does have  multiple striae.  He has moderate epigastric tenderness on deep  palpation.  No rebound tenderness or guarding.  No mass or  hepatosplenomegaly.  Extremities are without clubbing or edema.   LABORATORY DATA:  White blood cell count 10.7, hemoglobin 14.1,  hematocrit 42, platelets 258.  Calcium 8.3, sodium 137, potassium 3,  chloride 101, CO2 28, BUN 13, creatinine 1, glucose 109.   IMPRESSION:  The patient is a 30 year old male with chronic GERD now  with severe symptoms of heart burn, indigestion, water brash, chest  pain, nausea, and hematemesis, suspect severe reflux esophagitis plus or  minus possible Mallory Weiss tear.   PLAN:  1. EGD with Dr. Karilyn Cota as soon as possible.  2. Consent will be obtained for procedure.  Discussed the procedure,      risks and benefits.  3. Continue proton pump inhibitor, as needed pain medications for      comfort.   We would like to thank you Dr. Gerda Diss for allowing [place] to participate in  the  care of the patient.      Nicholas Lose, N.P.      Lionel December, M.D.  Electronically Signed    KC/MEDQ  D:  05/12/2006  T:  05/12/2006  Job:  [id number]

## 2010-09-21 NOTE — H&P (Signed)
[name], [name]               ACCOUNT NO.:  [id number]   MEDICAL RECORD NO.:  [id number]          PATIENT TYPE:  INP   LOCATION:  A211                          FACILITY:  [facility]   PHYSICIAN:  Scott A. Gerda Diss, MD    DATE OF BIRTH:  09-09-1980   DATE OF ADMISSION:  05/10/2006  DATE OF DISCHARGE:  LH                              HISTORY & PHYSICAL   CHIEF COMPLAINT:  Chest discomfort and vomiting.   HISTORY OF PRESENT ILLNESS:  This patient has had an ongoing history of  reflux, for which he has not taken any medicines other than over-the-  counter. He thinks he had been prescribed medicine for reflux in the  past but just is not consistently getting it filled or taking it. He  relates that he threw up at least 6 times this morning and around the  4th time he threw up, he started noticing that there was a fair amount  of blood in it and his chest became real tight and he had a hard time  breathing. It lasted while he was throwing up. Then after the vomited  settled down, he noticed that he had anterior chest pain and anterior  epigastric abdominal pain. He denied shortness of breath, sweats, or  diarrhea today. No fevers. When he moves around and does things, he does  not get chest pressure or tightness.   PAST MEDICAL HISTORY:  Reflux. He has never had an EGD. He has had  reflux for several years. He treats it with over-the-counter. He states  that he thinks he had a hernia repair several years ago.   SOCIAL HISTORY:  Denies smoking or drinking. States he does not do  drugs.   MEDICATIONS:  Xanax, presumably 0.5 mg twice daily. Vicodin on a p.r.n.  basis.   FAMILY HISTORY:  Noncontributory.   REVIEW OF SYSTEMS:  Negative for headache, sore throat, fever, chills,  cough, congestion, sweats. Negative for dysphagia. Has classic reflux  symptoms. These occur multiple times per week. At times, stools are  watery.   PHYSICAL EXAMINATION:  VITAL SIGNS:  Stable.  HEENT:   PERRLA.  NECK:  Supple. No masses.  CHEST:  CTA. RNL.  HEART:  Regular without murmurs.  ABDOMEN:  Soft. No masses.  EXTREMITIES:  No edema.  SKIN:  Warm and dry.  NEUROLOGIC:  Grossly normal.   LABORATORY DATA:  Drug screen positive for marijuana and  benzodiazepines. Labs negative on the D-dimer. H&H okay. Cardiac enzymes  negative.   EMERGENCY DEPARTMENT COURSE:  He has been given several different things  here in the ER including a chewable aspirin, GI cocktail, Protonix IV,  nitroglycerin sublingual, Dexamethasone 10 mg IVP and Ibuprofen. None of  this has really helped him.   ASSESSMENT/PLAN:  1. Chest pain. I feel this is more likely GI in origin but since we      are bringing him in the hospital, will go ahead and admit to      telemetry and do enzyme rule out overnight.  2. Severe reflux with hematemesis. Recheck CBC in the morning. Also  consult GI for expected EGD, probably on Monday. See orders for      further details.      Scott A. Gerda Diss, MD  Electronically Signed     SAL/MEDQ  D:  05/10/2006  T:  05/10/2006  Job:  [id number]

## 2010-09-21 NOTE — Op Note (Signed)
NAMEFRIEDRICH, HARRIOTT               ACCOUNT NO.:  [id number]   MEDICAL RECORD NO.:  [id number]          PATIENT TYPE:  INP   LOCATION:  A211                          FACILITY:  [facility]   PHYSICIAN:  Lionel December, M.D.    DATE OF BIRTH:  04-19-1981   DATE OF PROCEDURE:  05/12/2006  DATE OF DISCHARGE:                                PROCEDURE NOTE   PROCEDURE:  Esophagogastroduodenoscopy.   ENDOSCOPIST:  Lionel December, M.D.   INDICATIONS:  Bodie is a 30 year old Caucasian male with chronic GERD,  whose symptoms are poorly controlled with therapy, who is now admitted  with nausea, vomiting and hematemesis, but remains with stable  hemodynamics and H&H.  He is undergoing diagnostic EGD.  Procedure risks  were reviewed with the patient and informed consent was obtained.   MEDICATIONS FOR CONSCIOUS SEDATION:  Benzocaine spray for oropharyngeal  topical anesthesia, Demerol 100 mg IV, Versed 20 mg IV.   FINDINGS:  Procedure performed in endoscopy suite.  The patient's vital  signs and O2 SATs were monitored during the procedure and remained  stable.  Despite fairly high dose of conscious sedation, he was not well  sedated, but did tolerate the procedure well.  The patient was placed in  the left lateral decubitus position and Pentax videoscope was passed via  oropharynx without any difficulty into esophagus.   ESOPHAGUS:  Mucosa of the proximal and middle third was normal.  Distal  2-3 cm had a few linear erosions.  GE junction was at 40 cm from the  incisors and was unremarkable.   STOMACH:  It was empty and distended very well with inflation.  Folds in  the proximal stomach were normal.  Examination of the mucosa revealed a  few antral erosions, but no evidence of ulceration or pyloric stenosis.  Angularis, fundus and cardia were examined by retroflexing the scope and  were normal.   DUODENUM:  Bulbar mucosa was normal.  Scope was passed into the second  part of the duodenum, where  mucosa and folds were normal.  Endoscope was  withdrawn.  The patient tolerated the procedure well.   FINAL DIAGNOSIS:  Erosive reflux esophagitis and erosive antral  gastritis, otherwise normal esophagogastroduodenoscopy.   No evidence of peptic ulcer disease, pyloric stenosis or Barrett's  esophagus.   RECOMMENDATIONS:  1. Antireflux measures need to be reinforced.  2. Continue PPI at a b.i.d. schedule for 4-8 weeks, thereafter daily.  3. H. pylori serology will be checked.      Lionel December, M.D.  Electronically Signed     NR/MEDQ  D:  05/12/2006  T:  05/13/2006  Job:  [id number]   cc:   Lorin Picket A. Gerda Diss, MD  Fax: [phone]

## 2010-10-21 ENCOUNTER — Emergency Department (HOSPITAL_COMMUNITY)
Admission: EM | Admit: 2010-10-21 | Discharge: 2010-10-21 | Disposition: A | Discharge status: Home / Self Care | Payer: Medicaid Other | Attending: Emergency Medicine | Admitting: Emergency Medicine

## 2010-10-21 ENCOUNTER — Emergency Department (HOSPITAL_COMMUNITY): Payer: Medicaid Other

## 2010-10-21 DIAGNOSIS — Z79899 Other long term (current) drug therapy: Secondary | ICD-10-CM | POA: Insufficient documentation

## 2010-10-21 DIAGNOSIS — F329 Major depressive disorder, single episode, unspecified: Secondary | ICD-10-CM | POA: Insufficient documentation

## 2010-10-21 DIAGNOSIS — K219 Gastro-esophageal reflux disease without esophagitis: Secondary | ICD-10-CM | POA: Insufficient documentation

## 2010-10-21 DIAGNOSIS — R109 Unspecified abdominal pain: Secondary | ICD-10-CM | POA: Insufficient documentation

## 2010-10-21 DIAGNOSIS — Y838 Other surgical procedures as the cause of abnormal reaction of the patient, or of later complication, without mention of misadventure at the time of the procedure: Secondary | ICD-10-CM | POA: Insufficient documentation

## 2010-10-21 DIAGNOSIS — F411 Generalized anxiety disorder: Secondary | ICD-10-CM | POA: Insufficient documentation

## 2010-10-21 DIAGNOSIS — I10 Essential (primary) hypertension: Secondary | ICD-10-CM | POA: Insufficient documentation

## 2010-10-21 DIAGNOSIS — IMO0002 Reserved for concepts with insufficient information to code with codable children: Secondary | ICD-10-CM | POA: Insufficient documentation

## 2010-10-21 DIAGNOSIS — F3289 Other specified depressive episodes: Secondary | ICD-10-CM | POA: Insufficient documentation

## 2010-10-21 LAB — DIFFERENTIAL
Basophils Absolute: 0 10*3/uL (ref 0.0–0.1)
Eosinophils Relative: 2 % (ref 0–5)
Lymphocytes Relative: 29 % (ref 12–46)
Lymphs Abs: 3.1 10*3/uL (ref 0.7–4.0)
Monocytes Absolute: 0.8 10*3/uL (ref 0.1–1.0)
Monocytes Relative: 8 % (ref 3–12)

## 2010-10-21 LAB — CBC
HCT: 45.5 % (ref 39.0–52.0)
MCH: 32.6 pg (ref 26.0–34.0)
MCHC: 34.9 g/dL (ref 30.0–36.0)
MCV: 93.2 fL (ref 78.0–100.0)
RDW: 13.4 % (ref 11.5–15.5)

## 2010-10-21 LAB — BASIC METABOLIC PANEL
BUN: 15 mg/dL (ref 6–23)
Calcium: 9.7 mg/dL (ref 8.4–10.5)
Creatinine, Ser: 1.26 mg/dL (ref 0.50–1.35)
GFR calc Af Amer: >60 mL/min (ref >60)
GFR calc non Af Amer: >60 mL/min (ref >60)

## 2010-10-21 LAB — URINALYSIS, ROUTINE W REFLEX MICROSCOPIC
Bilirubin Urine: NEGATIVE
Ketones, ur: NEGATIVE mg/dL
Nitrite: NEGATIVE
Urobilinogen, UA: 1 mg/dL (ref 0.0–1.0)

## 2010-10-21 MED ORDER — IOHEXOL 300 MG/ML  SOLN
100.0000 mL | Freq: Once | INTRAMUSCULAR | Status: AC | PRN
  Administered 2010-10-21: 100 mL via INTRAVENOUS

## 2011-02-05 LAB — HEMOGLOBIN AND HEMATOCRIT, BLOOD
HCT: 43
Hemoglobin: 14.8

## 2011-02-05 LAB — BASIC METABOLIC PANEL
CO2: 28
Calcium: 9
Chloride: 106
Creatinine, Ser: 0.9
Glucose, Bld: 104 — ABNORMAL HIGH
Sodium: 139

## 2011-02-27 ENCOUNTER — Telehealth: Payer: Self-pay

## 2011-02-27 NOTE — Telephone Encounter (Signed)
Pt called and said he was seen at the [facility] yesterday and they were faxing over a request for him to have an EGD. ( Have you received fax this AM)?

## 2011-03-03 ENCOUNTER — Emergency Department (HOSPITAL_COMMUNITY)
Admission: EM | Admit: 2011-03-03 | Discharge: 2011-03-03 | Disposition: A | Discharge status: Home / Self Care | Payer: Medicaid Other | Attending: Emergency Medicine | Admitting: Emergency Medicine

## 2011-03-03 DIAGNOSIS — F17200 Nicotine dependence, unspecified, uncomplicated: Secondary | ICD-10-CM | POA: Insufficient documentation

## 2011-03-03 DIAGNOSIS — F319 Bipolar disorder, unspecified: Secondary | ICD-10-CM | POA: Insufficient documentation

## 2011-03-03 DIAGNOSIS — I10 Essential (primary) hypertension: Secondary | ICD-10-CM | POA: Insufficient documentation

## 2011-03-03 DIAGNOSIS — K219 Gastro-esophageal reflux disease without esophagitis: Secondary | ICD-10-CM

## 2011-03-03 HISTORY — DX: Anxiety disorder, unspecified: F41.9

## 2011-03-03 HISTORY — DX: Major depressive disorder, single episode, unspecified: F32.9

## 2011-03-03 HISTORY — DX: Gastro-esophageal reflux disease without esophagitis: K21.9

## 2011-03-03 HISTORY — DX: Depression, unspecified: F32.A

## 2011-03-03 HISTORY — DX: Essential (primary) hypertension: I10

## 2011-03-03 LAB — CBC
HCT: 44.3 % (ref 39.0–52.0)
Hemoglobin: 14.9 g/dL (ref 13.0–17.0)
MCV: 94.1 fL (ref 78.0–100.0)
RDW: 12.8 % (ref 11.5–15.5)
WBC: 8.1 10*3/uL (ref 4.0–10.5)

## 2011-03-03 LAB — COMPREHENSIVE METABOLIC PANEL
AST: 14 U/L (ref 0–37)
BUN: 9 mg/dL (ref 6–23)
CO2: 28 mEq/L (ref 19–32)
Calcium: 9.2 mg/dL (ref 8.4–10.5)
Chloride: 103 mEq/L (ref 96–112)
Creatinine, Ser: 1.01 mg/dL (ref 0.50–1.35)
GFR calc Af Amer: >90 mL/min (ref >90)
GFR calc non Af Amer: >90 mL/min (ref >90)
Glucose, Bld: 113 mg/dL — ABNORMAL HIGH (ref 70–99)
Total Bilirubin: 0.5 mg/dL (ref 0.3–1.2)

## 2011-03-03 LAB — DIFFERENTIAL
Basophils Absolute: 0 10*3/uL (ref 0.0–0.1)
Eosinophils Relative: 2 % (ref 0–5)
Lymphocytes Relative: 30 % (ref 12–46)
Monocytes Absolute: 0.6 10*3/uL (ref 0.1–1.0)
Monocytes Relative: 7 % (ref 3–12)
Neutro Abs: 4.9 10*3/uL (ref 1.7–7.7)

## 2011-03-03 LAB — LIPASE, BLOOD: Lipase: 18 U/L (ref 11–59)

## 2011-03-03 MED ORDER — PROMETHAZINE HCL 25 MG PO TABS: 25.0000 mg | ORAL_TABLET | Freq: Four times a day (QID) | ORAL | Status: DC | PRN

## 2011-03-03 MED ORDER — FAMOTIDINE IN NACL 20-0.9 MG/50ML-% IV SOLN
20.0000 mg | Freq: Once | INTRAVENOUS | Status: AC
  Administered 2011-03-03: 20 mg via INTRAVENOUS
  Filled 2011-03-03: qty 50

## 2011-03-03 MED ORDER — TRAMADOL HCL 50 MG PO TABS
100.0000 mg | ORAL_TABLET | Freq: Once | ORAL | Status: AC
  Administered 2011-03-03: 100 mg via ORAL
  Filled 2011-03-03: qty 2

## 2011-03-03 MED ORDER — SODIUM CHLORIDE 0.9 % IV BOLUS (SEPSIS)
1000.0000 mL | Freq: Once | INTRAVENOUS | Status: AC
  Administered 2011-03-03: 1000 mL via INTRAVENOUS

## 2011-03-03 MED ORDER — SODIUM CHLORIDE 0.9 % IV SOLN
INTRAVENOUS | Status: DC
  Administered 2011-03-03: 15:00:00 via INTRAVENOUS

## 2011-03-03 MED ORDER — ONDANSETRON HCL 4 MG/2ML IJ SOLN
4.0000 mg | Freq: Once | INTRAMUSCULAR | Status: AC
  Administered 2011-03-03: 4 mg via INTRAVENOUS
  Filled 2011-03-03: qty 2

## 2011-03-03 MED ORDER — GI COCKTAIL ~~LOC~~
30.0000 mL | Freq: Once | ORAL | Status: AC
  Administered 2011-03-03: 30 mL via ORAL
  Filled 2011-03-03: qty 30

## 2011-03-03 NOTE — ED Notes (Signed)
Pt presents with hematemesis, abdominal pain,  n/v, and rectal bleeding. Pt states he has GERD and will wake up vomiting and there is blood in his vomit. Pt states he was seen by [facility] and was diagnosed with blood in stool. Pt has been waiting on GI referral. Pt states he had hernia repair in 09/2010.

## 2011-03-03 NOTE — ED Provider Notes (Signed)
History     CSN: [id number] Arrival date & time: 03/03/2011  2:19 PM   First MD Initiated Contact with Patient 03/03/11 1421      Chief Complaint  Patient presents with  . Hematemesis    (Consider location/radiation/quality/duration/timing/severity/associated sxs/prior treatment) HPI  Patient relates he has a long history of GERD and he states it gets worse when he lays down he states he wakes up about 4 AM everyday and he has vomiting with blood in it he is unable to tell me how much blood he sees is also unable to tell me how may times he throws up on the average. He states it's been happening for the past 1-1/2 weeks. Also during the day he is spitting up foamy fluid that does not have blood in it. He denies any melena he does describe epigastric pain which he states is "like having to go to the bathroom" he also gets some burning reflux when he throws a states he has a normal appetite he denies feeling weak dizzy or having diarrhea he denies chest pain. He states he has been referred to Dr. Darrick Penna gastroenterologist but does not have an appointment yet Nothing makes them feel better nothing makes him feel worse  Primary doctor [facility]  health department  Past Medical History  Diagnosis Date  . GERD (gastroesophageal reflux disease)   . Hypertension   . Anxiety   . Depression    bipolar  Past Surgical History  Procedure Date  . Hernia repair   . Knee surgery     History reviewed. No pertinent family history.  History  Substance Use Topics  . Smoking status: Current Everyday Smoker -- 0.5 packs/day  . Smokeless tobacco: Not on file  . Alcohol Use: No   unemployed    Review of Systems  All other systems reviewed and are negative.    Allergies  Propoxyphene n-acetaminophen  Home Medications   Current Outpatient Rx  Name Route Sig Dispense Refill  . ALBUTEROL SULFATE HFA 108 (90 BASE) MCG/ACT IN AERS Inhalation Inhale 2 puffs into the lungs every 6  (six) hours as needed. Shortness of breath     . ALPRAZOLAM 1 MG PO TABS Oral Take 1 mg by mouth 4 (four) times daily.      Marland Kitchen CARBAMAZEPINE 100 MG PO CHEW Oral Chew 100 mg by mouth 4 (four) times daily.      Marland Kitchen OMEPRAZOLE 40 MG PO CPDR Oral Take 40 mg by mouth 2 (two) times daily.      Marland Kitchen PRAVASTATIN SODIUM 40 MG PO TABS Oral Take 40 mg by mouth daily.      Marland Kitchen PROMETHAZINE HCL 25 MG PO TABS Oral Take 25 mg by mouth every 6 (six) hours as needed. Nausea/vomitting     . TOPIRAMATE 100 MG PO TABS Oral Take 100 mg by mouth 2 (two) times daily.      Marland Kitchen PROMETHAZINE HCL 25 MG PO TABS Oral Take 1 tablet (25 mg total) by mouth every 6 (six) hours as needed for nausea. 8 tablet 0    BP 148/77  Pulse 82  Temp(Src) 98.6 F (37 C) (Oral)  Resp 21  Ht 5\' 9"  (1.753 m)  Wt 300 lb (136.079 kg)  BMI 44.30 kg/m2  SpO2 98%  Physical Exam  Vitals reviewed. Constitutional: He is oriented to person, place, and time. He appears well-developed and well-nourished.       Patient appears to be in no distress  HENT:  Head: Normocephalic and atraumatic.  Eyes: Conjunctivae and EOM are normal. Pupils are equal, round, and reactive to light.  Neck: Normal range of motion. Neck supple.  Cardiovascular: Normal rate, regular rhythm and normal heart sounds.   Pulmonary/Chest: Effort normal and breath sounds normal.  Abdominal: Soft. Bowel sounds are normal. There is tenderness.       Mild tenderness to palpation in the epigastric region  Genitourinary:       Rectal exam shows no external hemorrhoids he has yellow stool that is heme-negative  Musculoskeletal: Normal range of motion.  Neurological: He is alert and oriented to person, place, and time.  Skin: Skin is warm and dry.       Patient has normal skin color he does not appear to be pale his conjunctivae are also not pale  Psychiatric:       Patient affect is very flat    ED Course  Procedures (including critical care time)  Results for orders placed  during the hospital encounter of 03/03/11  CBC      Component Value Range   WBC 8.1  4.0 - 10.5 (K/uL)   RBC 4.71  4.22 - 5.81 (MIL/uL)   Hemoglobin 14.9  13.0 - 17.0 (g/dL)   HCT 16.1  09.6 - 04.5 (%)   MCV 94.1  78.0 - 100.0 (fL)   MCH 31.6  26.0 - 34.0 (pg)   MCHC 33.6  30.0 - 36.0 (g/dL)   RDW 40.9  81.1 - 91.4 (%)   Platelets 197  150 - 400 (K/uL)  DIFFERENTIAL      Component Value Range   Neutrophils Relative 61  43 - 77 (%)   Neutro Abs 4.9  1.7 - 7.7 (K/uL)   Lymphocytes Relative 30  12 - 46 (%)   Lymphs Abs 2.4  0.7 - 4.0 (K/uL)   Monocytes Relative 7  3 - 12 (%)   Monocytes Absolute 0.6  0.1 - 1.0 (K/uL)   Eosinophils Relative 2  0 - 5 (%)   Eosinophils Absolute 0.2  0.0 - 0.7 (K/uL)   Basophils Relative 0  0 - 1 (%)   Basophils Absolute 0.0  0.0 - 0.1 (K/uL)  COMPREHENSIVE METABOLIC PANEL      Component Value Range   Sodium 138  135 - 145 (mEq/L)   Potassium 3.9  3.5 - 5.1 (mEq/L)   Chloride 103  96 - 112 (mEq/L)   CO2 28  19 - 32 (mEq/L)   Glucose, Bld 113 (*) 70 - 99 (mg/dL)   BUN 9  6 - 23 (mg/dL)   Creatinine, Ser 7.82  0.50 - 1.35 (mg/dL)   Calcium 9.2  8.4 - 95.6 (mg/dL)   Total Protein 6.7  6.0 - 8.3 (g/dL)   Albumin 3.8  3.5 - 5.2 (g/dL)   AST 14  0 - 37 (U/L)   ALT 18  0 - 53 (U/L)   Alkaline Phosphatase 94  39 - 117 (U/L)   Total Bilirubin 0.5  0.3 - 1.2 (mg/dL)   GFR calc non Af Amer >90  >90 (mL/min)   GFR calc Af Amer >90  >90 (mL/min)  LIPASE, BLOOD      Component Value Range   Lipase 18  11 - 59 (U/L)   Hemoccult was negative.  Laboratory interpretation no significant abnormality  Pt had [facility] CT done in June when he had a possible seroma from his hernia repair. No other acute abnormality seen.  Course he was given  IV fluids and IV Pepcid and a GI cocktail he relates his pain is worse. Patient given Ultracet by mouth.   1. GERD (gastroesophageal reflux disease)    Pt to keep appointment with Dr Darrick Penna, he can switch to protonix OTC twice  a day, he should stop smoking.    Devoria Albe, MD, FACEP   MDM          Ward Givens, MD 03/03/11 [phone]

## 2011-03-04 NOTE — Telephone Encounter (Signed)
I have not seen

## 2011-03-06 ENCOUNTER — Emergency Department (HOSPITAL_COMMUNITY): Payer: Medicaid Other

## 2011-03-06 ENCOUNTER — Encounter (HOSPITAL_COMMUNITY): Payer: Self-pay | Admitting: Emergency Medicine

## 2011-03-06 ENCOUNTER — Ambulatory Visit (INDEPENDENT_AMBULATORY_CARE_PROVIDER_SITE_OTHER): Payer: Self-pay | Admitting: Gastroenterology

## 2011-03-06 ENCOUNTER — Emergency Department (HOSPITAL_COMMUNITY)
Admission: EM | Admit: 2011-03-06 | Discharge: 2011-03-07 | Disposition: A | Discharge status: Home / Self Care | Payer: Medicaid Other | Attending: Emergency Medicine | Admitting: Emergency Medicine

## 2011-03-06 ENCOUNTER — Other Ambulatory Visit: Payer: Self-pay

## 2011-03-06 ENCOUNTER — Encounter: Payer: Self-pay | Admitting: Gastroenterology

## 2011-03-06 VITALS — BP 144/94 | HR 94 | Temp 97.6°F | Ht 69.0 in | Wt 302.0 lb

## 2011-03-06 DIAGNOSIS — F329 Major depressive disorder, single episode, unspecified: Secondary | ICD-10-CM | POA: Insufficient documentation

## 2011-03-06 DIAGNOSIS — I10 Essential (primary) hypertension: Secondary | ICD-10-CM | POA: Insufficient documentation

## 2011-03-06 DIAGNOSIS — Z8614 Personal history of Methicillin resistant Staphylococcus aureus infection: Secondary | ICD-10-CM | POA: Insufficient documentation

## 2011-03-06 DIAGNOSIS — R1110 Vomiting, unspecified: Secondary | ICD-10-CM | POA: Insufficient documentation

## 2011-03-06 DIAGNOSIS — R109 Unspecified abdominal pain: Secondary | ICD-10-CM | POA: Insufficient documentation

## 2011-03-06 DIAGNOSIS — R112 Nausea with vomiting, unspecified: Secondary | ICD-10-CM

## 2011-03-06 DIAGNOSIS — F411 Generalized anxiety disorder: Secondary | ICD-10-CM | POA: Insufficient documentation

## 2011-03-06 DIAGNOSIS — G8929 Other chronic pain: Secondary | ICD-10-CM | POA: Insufficient documentation

## 2011-03-06 DIAGNOSIS — K219 Gastro-esophageal reflux disease without esophagitis: Secondary | ICD-10-CM | POA: Insufficient documentation

## 2011-03-06 DIAGNOSIS — F3289 Other specified depressive episodes: Secondary | ICD-10-CM | POA: Insufficient documentation

## 2011-03-06 DIAGNOSIS — W19XXXA Unspecified fall, initial encounter: Secondary | ICD-10-CM | POA: Insufficient documentation

## 2011-03-06 DIAGNOSIS — R569 Unspecified convulsions: Secondary | ICD-10-CM | POA: Insufficient documentation

## 2011-03-06 DIAGNOSIS — R55 Syncope and collapse: Secondary | ICD-10-CM

## 2011-03-06 DIAGNOSIS — Z9889 Other specified postprocedural states: Secondary | ICD-10-CM | POA: Insufficient documentation

## 2011-03-06 DIAGNOSIS — F17200 Nicotine dependence, unspecified, uncomplicated: Secondary | ICD-10-CM | POA: Insufficient documentation

## 2011-03-06 LAB — BASIC METABOLIC PANEL
Calcium: 10 mg/dL (ref 8.4–10.5)
Chloride: 98 mEq/L (ref 96–112)
Creatinine, Ser: 1.14 mg/dL (ref 0.50–1.35)
GFR calc Af Amer: >90 mL/min (ref >90)
Sodium: 137 mEq/L (ref 135–145)

## 2011-03-06 LAB — DIFFERENTIAL
Basophils Absolute: 0 10*3/uL (ref 0.0–0.1)
Basophils Relative: 0 % (ref 0–1)
Lymphocytes Relative: 11 % — ABNORMAL LOW (ref 12–46)
Monocytes Absolute: 1.3 10*3/uL — ABNORMAL HIGH (ref 0.1–1.0)
Neutro Abs: 14.9 10*3/uL — ABNORMAL HIGH (ref 1.7–7.7)

## 2011-03-06 LAB — CBC
HCT: 47.8 % (ref 39.0–52.0)
MCHC: 33.9 g/dL (ref 30.0–36.0)
Platelets: 254 10*3/uL (ref 150–400)
RDW: 13 % (ref 11.5–15.5)
WBC: 18.1 10*3/uL — ABNORMAL HIGH (ref 4.0–10.5)

## 2011-03-06 LAB — ETHANOL: Alcohol, Ethyl (B): <11 mg/dL (ref 0–11)

## 2011-03-06 MED ORDER — DEXLANSOPRAZOLE 60 MG PO CPDR: 60.0000 mg | DELAYED_RELEASE_CAPSULE | Freq: Every day | ORAL | Status: AC

## 2011-03-06 NOTE — ED Notes (Signed)
Witnessed seizure fell from standing position.

## 2011-03-06 NOTE — Assessment & Plan Note (Signed)
Likely due to uncontrolled GERD. GES normal in 2009. See GERD.

## 2011-03-06 NOTE — ED Provider Notes (Signed)
Scribed for Donnetta Hutching, MD, the patient was seen in room APA03/APA03. This chart was scribed by [employer]. The patient's care started at 21:17  CSN: [id number] Arrival date & time: 03/06/2011  8:42 PM   First MD Initiated Contact with Patient 03/06/11 2117      Chief Complaint  Patient presents with  . Seizures   HPI [name] is a 30 y.o. male who presents to the Emergency Department complaining of Seizures. Per nurse, witnesses state he was standing when he suddenly fell. Per mother, patient just got on a new medication called dexilant. Patient denies alcohol or drug use.  Past Medical History  Diagnosis Date  . GERD (gastroesophageal reflux disease)     Bravo study Nov 2009 on Prilosec 40 BID with adequate acid suppression  . Hypertension   . Anxiety   . Depression     increased over past several months  . History of MRSA infection   . Chronic abdominal pain     HIDA scan Sept 2010, EF 93%, s/p chole oct 2010; evaluated at [facility] for abdominal wall pain; Imipramine added May 2011  . Chronic vomiting     normal GES 2009  . S/P endoscopy     2008 Dr. Karilyn Cota: erosive reflux esophagitis, antral gastritis, H.pylori serologies neg, Nov 2009 Dr. Darrick Penna: gastritis, benign esophageal polyp, no H.pylori,  Feb 2011: [facility], Dr. Bubba Hales: normal esophagus, normal stomach, normal duodenum, path unremarkable    Past Surgical History  Procedure Date  . Ventral hernia repair March 2012    Dr. Lovell Sheehan  . Knee surgery 2009  . Cholecystectomy 10/10  . Appendectomy 5/10  . Esophagogastroduodenoscopy 05/2006    H.pylori neg  . Inguinal hernia repair     child    Family History  Problem Relation Age of Onset  . Colon cancer Neg Hx     History  Substance Use Topics  . Smoking status: Current Everyday Smoker -- 0.5 packs/day  . Smokeless tobacco: Not on file   Comment: trying to quit, down to about 3 cigarettes per day  . Alcohol Use: No      Review of Systems    Musculoskeletal: Positive for back pain.  All other systems reviewed and are negative.    Allergies  Propoxyphene n-acetaminophen  Home Medications   Current Outpatient Rx  Name Route Sig Dispense Refill  . ALBUTEROL SULFATE HFA 108 (90 BASE) MCG/ACT IN AERS Inhalation Inhale 2 puffs into the lungs every 6 (six) hours as needed. Shortness of breath     . ALPRAZOLAM 1 MG PO TABS Oral Take 1 mg by mouth 4 (four) times daily.      Marland Kitchen CARBAMAZEPINE 100 MG PO CHEW Oral Chew 100 mg by mouth 4 (four) times daily.      . DEXLANSOPRAZOLE 60 MG PO CPDR Oral Take 1 capsule (60 mg total) by mouth daily. 15 capsule 0  . OMEGA-3 FATTY ACIDS 1000 MG PO CAPS Oral Take 2 g by mouth daily.      Marland Kitchen LISINOPRIL-HYDROCHLOROTHIAZIDE 20-25 MG PO TABS Oral Take 1 tablet by mouth daily.      Marland Kitchen OMEPRAZOLE 40 MG PO CPDR Oral Take 40 mg by mouth 2 (two) times daily.      Marland Kitchen PRAVASTATIN SODIUM 40 MG PO TABS Oral Take 40 mg by mouth daily.      Marland Kitchen PROMETHAZINE HCL 25 MG PO TABS Oral Take 25 mg by mouth every 6 (six) hours as needed. Nausea/vomitting     .  PROMETHAZINE HCL 25 MG PO TABS Oral Take 1 tablet (25 mg total) by mouth every 6 (six) hours as needed for nausea. 8 tablet 0  . TOPIRAMATE 100 MG PO TABS Oral Take 100 mg by mouth 2 (two) times daily.        BP 178/106  Pulse 117  Temp(Src) 99.3 F (37.4 C) (Oral)  Resp 20  SpO2 97%  Physical Exam  Nursing note and vitals reviewed. Constitutional: He is oriented to person, place, and time. He appears well-developed and well-nourished. No distress.  HENT:  Head: Normocephalic and atraumatic.  Right Ear: External ear normal.  Left Ear: External ear normal.       Tenderness to the occipital area  Eyes: Conjunctivae are normal. Right eye exhibits no discharge. Left eye exhibits no discharge. No scleral icterus.  Neck: Neck supple. No tracheal deviation present.  Cardiovascular: Normal rate, regular rhythm and intact distal pulses.   Pulmonary/Chest: Effort  normal and breath sounds normal. No stridor. No respiratory distress. He has no wheezes. He has no rales.  Abdominal: Soft. Bowel sounds are normal. He exhibits no distension. There is no tenderness. There is no rebound and no guarding.       Abdomen obese  Musculoskeletal: He exhibits tenderness (Left lateral ribs at about T8). He exhibits no edema.  Neurological: He is alert and oriented to person, place, and time. He has normal strength. No sensory deficit. Cranial nerve deficit:  no gross defecits noted. He exhibits normal muscle tone. He displays no seizure activity. Coordination normal.  Skin: Skin is warm and dry. No rash noted. No erythema.  Psychiatric: He has a normal mood and affect.       Patient is tearful     ED Course  Procedures   DIAGNOSTIC STUDIES: Oxygen Saturation is 97% on room air, normal by my interpretation.    COORDINATION OF CARE: 21:21 - EDP examined patient at bedside. The following orders were placed Orders Placed This Encounter  Procedures  . CT Head Wo Contrast  . CBC  . Differential  . Basic metabolic panel  . Ethanol  . Drug screen panel, emergency    Ct Head Wo Contrast  03/06/2011  *RADIOLOGY REPORT*  Clinical Data: Seizure and fall.  Occipital tenderness.  CT HEAD WITHOUT CONTRAST  Technique:  Contiguous axial images were obtained from the base of the skull through the vertex without contrast.  Comparison: None.  Findings: The ventricles and sulci appear symmetrical.  No mass effect or midline shift.  No abnormal extra-axial fluid collections.  Gray-white matter junctions are distinct.  Basal cisterns are not effaced.  No evidence of acute intracranial hemorrhage.  No depressed skull fractures.  Visualized paranasal sinuses are not opacified.  IMPRESSION: No evidence of acute intracranial hemorrhage, mass lesion, or acute infarct.  Original Report Authenticated By: Marlon Pel, M.D.     Date: 03/06/2011  Rate: 98  Rhythm: normal sinus  rhythm  QRS Axis: normal  Intervals: normal  ST/T Wave abnormalities: inv T waves ant-lat  Conduction Disutrbances:none  Narrative Interpretation:   Old EKG Reviewed: changes noted  MDM  Patient allegedly had syncopal spell. No chest pain or shortness of breath. Flipped T waves noted in lateral leads. Uncertain etiology of same. No history of heart problems. Nursing notes a seizure, but this is undocumented. Patient will return if worse I personally performed the services described in this documentation, which was scribed in my presence. The recorded information has been reviewed and  considered.        Donnetta Hutching, MD 03/06/11 [phone]

## 2011-03-06 NOTE — Assessment & Plan Note (Addendum)
30 year old male well-known to our practice. Has undergone multiple endoscopic procedures in the past due to chronic abdominal pain, N/V. No significant findings have been found. GES normal in 2009. His weight has steadily increased since 2009; however, it is stable without any changes since May 2011. Bravo study in Nov 2009 with adequate acid suppression on Prilosec BID. Last EGD by [facility] in Feb 2011. GERD likely multifactorial, with significant obesity, dietary and behavior components. Reports possible hematemesis, usually occurring between the hours of 3am-7am each morning; however, he notes no signs of bleeding with vomiting throughout the day. CBC without anemia. No melena.  At this point, will switch PPIs and discuss with Dr. Darrick Penna possible updated EGD. It is unclear the amount of hematemesis he is experiencing; likely due to repeated vomiting secondary to uncontrolled GERD (MW tear?).  Pt is to monitor for any increased abdominal pain, N/V, increased bloody emesis. We will discuss pursuing an EGD in the near future. As of now, hemodynamically stable. No distress.

## 2011-03-06 NOTE — Patient Instructions (Signed)
Stop taking Omeprazole (Prilosec). Start taking Dexilant each morning, same time every day.   Review reflux diet. Eat small meals, do not eat within 2 hours of going to bed.   I will be discussing with Dr. Darrick Penna a possible look at your esophagus and stomach. In the meantime, continue Dexilant and watch for any large amounts of bloody vomit, worsening nausea or vomiting, or worsening abdominal pain.

## 2011-03-06 NOTE — Progress Notes (Signed)
Referring Provider: No ref. provider found Primary Care Physician:  Cristal Ford, DO Primary Gastroenterologist: Dr. Darrick Penna   Chief Complaint  Patient presents with  . Gastrophageal Reflux    wakes up in the middle odf the night throwing up blood and white stuff     HPI:   [name] is a 30-year male that is well-known to our practice. He was last seen in May 2011. He has a history of chronic abdominal pain, N/V, GERD. He has had multiple EGDs in the past as outlined in the PMH. Three have been done since 2008, with the most recent at [facility] in Feb 2011. He has had multiple CT scans. 3 have been performed since Dec 2011. His past work-up is outlined in the PMH. Over the past few years he has continued to gain weight. In 2009 he was 256. He was 303 in May 2011. He remains stable with his weight today. His labs continue to show no abnormalities. CBC without anemia, lipase normal, LFTs normal.  He presents today with concerns regarding reported hematemesis. States ran out of Prilosec 40 BID several weeks ago. [facility] to [facility] a [facility]ill [facility] per his report. Stated reflux "flared up". Reports vomiting each morning around 3am-7am. Complains of retrosternal burning, waking him up. States emesis looks like kool aid, like he drank a "whole glass of kool aid". Complains of intermittent reflux during the day, spitting up white, foamy phlegm. He actually spit up some phlegm during the visit today.   Also reports  RUQ pain. Intermittent. Not associated with eating/drinking. Interestingly, he denies any signs of hematemesis during the day, only in the early morning. No NSAIDs or aspirin powders.   He is no longer on Baclofen or Imipramine as we had prescribed.   Past Medical History  Diagnosis Date  . GERD (gastroesophageal reflux disease)     Bravo study Nov 2009 on Prilosec 40 BID with adequate acid suppression  . Hypertension   . Anxiety   . Depression     increased over past  several months  . History of MRSA infection   . Chronic abdominal pain     HIDA scan Sept 2010, EF 93%, s/p chole oct 2010; evaluated at [facility] for abdominal wall pain; Imipramine added May 2011  . Chronic vomiting     normal GES 2009  . S/P endoscopy     2008 Dr. Karilyn Cota: erosive reflux esophagitis, antral gastritis, H.pylori serologies neg, Nov 2009 Dr. Darrick Penna: gastritis, benign esophageal polyp, no H.pylori,  Feb 2011: [facility], Dr. Bubba Hales: normal esophagus, normal stomach, normal duodenum, path unremarkable    Past Surgical History  Procedure Date  . Ventral hernia repair March 2012    Dr. Lovell Sheehan  . Knee surgery 2009  . Cholecystectomy 10/10  . Appendectomy 5/10  . Esophagogastroduodenoscopy 05/2006    H.pylori neg  . Inguinal hernia repair     child    Current Outpatient Prescriptions  Medication Sig Dispense Refill  . albuterol (PROVENTIL HFA;VENTOLIN HFA) 108 (90 BASE) MCG/ACT inhaler Inhale 2 puffs into the lungs every 6 (six) hours as needed. Shortness of breath       . ALPRAZolam (XANAX) 1 MG tablet Take 1 mg by mouth 4 (four) times daily.        Marland Kitchen lisinopril-hydrochlorothiazide (PRINZIDE,ZESTORETIC) 20-25 MG per tablet Take 1 tablet by mouth daily.        Marland Kitchen omeprazole (PRILOSEC) 40 MG capsule Take 40 mg by mouth 2 (two) times daily.        Marland Kitchen  pravastatin (PRAVACHOL) 40 MG tablet Take 40 mg by mouth daily.        . promethazine (PHENERGAN) 25 MG tablet Take 25 mg by mouth every 6 (six) hours as needed. Nausea/vomitting       . topiramate (TOPAMAX) 100 MG tablet Take 100 mg by mouth 2 (two) times daily.        . carbamazepine (TEGRETOL) 100 MG chewable tablet Chew 100 mg by mouth 4 (four) times daily.        Marland Kitchen dexlansoprazole (DEXILANT) 60 MG capsule Take 1 capsule (60 mg total) by mouth daily.  15 capsule  0  . fish oil-omega-3 fatty acids 1000 MG capsule Take 2 g by mouth daily.        . promethazine (PHENERGAN) 25 MG tablet Take 1 tablet (25 mg total) by mouth every 6  (six) hours as needed for nausea.  8 tablet  0    Allergies as of 03/06/2011 - Review Complete 03/06/2011  Allergen Reaction Noted  . Propoxyphene n-acetaminophen Other (See Comments)     Family History  Problem Relation Age of Onset  . Colon cancer Neg Hx     History   Social History  . Marital Status: Single    Spouse Name: N/A    Number of Children: N/A  . Years of Education: N/A   Occupational History  .      applying for disability, depression   Social History Main Topics  . Smoking status: Current Everyday Smoker -- 0.5 packs/day  . Smokeless tobacco: None   Comment: trying to quit, down to about 3 cigarettes per day  . Alcohol Use: No  . Drug Use: No  . Sexually Active: None   Other Topics Concern  . None   Social History Narrative  . None    Review of Systems: Gen: Denies fever, chills, anorexia. Denies fatigue, weakness, weight loss.  CV: Denies chest pain, palpitations, syncope, peripheral edema, and claudication. Resp: Denies dyspnea at rest, cough, wheezing, coughing up blood, and pleurisy. GI: Denies vomiting blood, jaundice, and fecal incontinence.   Denies dysphagia or odynophagia. Derm: Denies rash, itching, dry skin Psych: Denies depression, anxiety, memory loss, confusion. No homicidal or suicidal ideation.  Heme: Denies bruising, bleeding, and enlarged lymph nodes.  Physical Exam: BP 144/94  Pulse 94  Temp(Src) 97.6 F (36.4 C) (Temporal)  Ht 5\' 9"  (1.753 m)  Wt 302 lb (136.986 kg)  BMI 44.60 kg/m2 General:   Alert and oriented. No distress noted. Pleasant and cooperative. Morbidly obese.  Head:  Normocephalic and atraumatic. Eyes:  Conjuctiva clear without scleral icterus. Mouth:  Oral mucosa pink and moist. Good dentition. No lesions. Neck:  Supple, without mass or thyromegaly. Heart:  S1, S2 present without murmurs, rubs, or gallops. Regular rate and rhythm. Abdomen:  +BS, soft, largely obese, non-tender and non-distended. No  rebound or guarding.Difficult to appreciate HSM due to body habitus.  Msk:  Symmetrical without gross deformities. Normal posture. Extremities:  Without edema. Neurologic:  Alert and  oriented x4;  grossly normal neurologically. Skin:  Intact without significant lesions or rashes. Cervical Nodes:  No significant cervical adenopathy. Psych:  Alert and cooperative. Normal mood and affect.

## 2011-03-06 NOTE — Assessment & Plan Note (Signed)
Chronic. No significant findings. Labs normal. Will likely need to be plugged back in with pain management. Consider adding Imipramine again.

## 2011-03-07 LAB — RAPID URINE DRUG SCREEN, HOSP PERFORMED: Opiates: NOT DETECTED

## 2011-03-07 NOTE — Progress Notes (Signed)
Cc to PCP 

## 2011-03-07 NOTE — ED Notes (Signed)
Pt left the er stating no needs 

## 2011-03-08 ENCOUNTER — Encounter (HOSPITAL_COMMUNITY): Payer: Self-pay | Admitting: Emergency Medicine

## 2011-03-08 ENCOUNTER — Telehealth: Payer: Self-pay

## 2011-03-08 ENCOUNTER — Emergency Department (HOSPITAL_COMMUNITY): Payer: Medicaid Other

## 2011-03-08 ENCOUNTER — Emergency Department (HOSPITAL_COMMUNITY)
Admission: EM | Admit: 2011-03-08 | Discharge: 2011-03-08 | Disposition: A | Discharge status: Home / Self Care | Payer: Medicaid Other | Attending: Emergency Medicine | Admitting: Emergency Medicine

## 2011-03-08 DIAGNOSIS — R61 Generalized hyperhidrosis: Secondary | ICD-10-CM | POA: Insufficient documentation

## 2011-03-08 DIAGNOSIS — F411 Generalized anxiety disorder: Secondary | ICD-10-CM | POA: Insufficient documentation

## 2011-03-08 DIAGNOSIS — Z9889 Other specified postprocedural states: Secondary | ICD-10-CM | POA: Insufficient documentation

## 2011-03-08 DIAGNOSIS — F3289 Other specified depressive episodes: Secondary | ICD-10-CM | POA: Insufficient documentation

## 2011-03-08 DIAGNOSIS — K219 Gastro-esophageal reflux disease without esophagitis: Secondary | ICD-10-CM

## 2011-03-08 DIAGNOSIS — R109 Unspecified abdominal pain: Secondary | ICD-10-CM | POA: Insufficient documentation

## 2011-03-08 DIAGNOSIS — G8929 Other chronic pain: Secondary | ICD-10-CM | POA: Insufficient documentation

## 2011-03-08 DIAGNOSIS — F17200 Nicotine dependence, unspecified, uncomplicated: Secondary | ICD-10-CM | POA: Insufficient documentation

## 2011-03-08 DIAGNOSIS — Z6841 Body Mass Index (BMI) 40.0 and over, adult: Secondary | ICD-10-CM | POA: Insufficient documentation

## 2011-03-08 DIAGNOSIS — Z8614 Personal history of Methicillin resistant Staphylococcus aureus infection: Secondary | ICD-10-CM | POA: Insufficient documentation

## 2011-03-08 DIAGNOSIS — I10 Essential (primary) hypertension: Secondary | ICD-10-CM | POA: Insufficient documentation

## 2011-03-08 DIAGNOSIS — R1012 Left upper quadrant pain: Secondary | ICD-10-CM | POA: Insufficient documentation

## 2011-03-08 DIAGNOSIS — F329 Major depressive disorder, single episode, unspecified: Secondary | ICD-10-CM | POA: Insufficient documentation

## 2011-03-08 DIAGNOSIS — R1011 Right upper quadrant pain: Secondary | ICD-10-CM | POA: Insufficient documentation

## 2011-03-08 LAB — COMPREHENSIVE METABOLIC PANEL
ALT: 20 U/L (ref 0–53)
BUN: 17 mg/dL (ref 6–23)
CO2: 27 mEq/L (ref 19–32)
Calcium: 10.1 mg/dL (ref 8.4–10.5)
Creatinine, Ser: 1.02 mg/dL (ref 0.50–1.35)
GFR calc Af Amer: >90 mL/min (ref >90)
GFR calc non Af Amer: >90 mL/min (ref >90)
Glucose, Bld: 124 mg/dL — ABNORMAL HIGH (ref 70–99)

## 2011-03-08 LAB — CBC
HCT: 48.9 % (ref 39.0–52.0)
Hemoglobin: 17.2 g/dL — ABNORMAL HIGH (ref 13.0–17.0)
MCH: 32.4 pg (ref 26.0–34.0)
MCHC: 35.2 g/dL (ref 30.0–36.0)
MCV: 92.1 fL (ref 78.0–100.0)
Platelets: 257 10*3/uL (ref 150–400)
RBC: 5.31 MIL/uL (ref 4.22–5.81)
RDW: 13 % (ref 11.5–15.5)
WBC: 16.2 10*3/uL — ABNORMAL HIGH (ref 4.0–10.5)

## 2011-03-08 LAB — LIPASE, BLOOD: Lipase: 20 U/L (ref 11–59)

## 2011-03-08 LAB — DIFFERENTIAL
Eosinophils Relative: 0 % (ref 0–5)
Lymphocytes Relative: 16 % (ref 12–46)
Lymphs Abs: 2.6 10*3/uL (ref 0.7–4.0)
Monocytes Absolute: 1.2 10*3/uL — ABNORMAL HIGH (ref 0.1–1.0)
Monocytes Relative: 7 % (ref 3–12)

## 2011-03-08 MED ORDER — PANTOPRAZOLE SODIUM 40 MG IV SOLR: 40.0000 mg | Freq: Once | INTRAVENOUS | Status: DC

## 2011-03-08 MED ORDER — ONDANSETRON HCL 4 MG/2ML IJ SOLN
4.0000 mg | Freq: Once | INTRAMUSCULAR | Status: AC
  Administered 2011-03-08: 4 mg via INTRAVENOUS
  Filled 2011-03-08: qty 2

## 2011-03-08 MED ORDER — SODIUM CHLORIDE 0.9 % IV BOLUS (SEPSIS)
1000.0000 mL | Freq: Once | INTRAVENOUS | Status: AC
  Administered 2011-03-08: 1000 mL via INTRAVENOUS

## 2011-03-08 MED ORDER — IOHEXOL 300 MG/ML  SOLN
100.0000 mL | Freq: Once | INTRAMUSCULAR | Status: AC | PRN
  Administered 2011-03-08: 100 mL via INTRAVENOUS

## 2011-03-08 MED ORDER — PANTOPRAZOLE SODIUM 40 MG IV SOLR
40.0000 mg | Freq: Once | INTRAVENOUS | Status: AC
  Administered 2011-03-08: 40 mg via INTRAVENOUS
  Filled 2011-03-08: qty 40

## 2011-03-08 NOTE — ED Notes (Signed)
Pt c/o abd pain n/v since last night. Pt seen in ed Tuesday for syncope.

## 2011-03-08 NOTE — Telephone Encounter (Signed)
Agree. Stick with Protonix BID.  Please add HFP, [place] of abdomen in the next few days.

## 2011-03-08 NOTE — Telephone Encounter (Signed)
Pt's father called and said that his son passed out and had to go to the ER. Does not know if it was the Dexilant but he said he made him feel funny. The ER did a CT scan and send him home. This morning the patient went to the health department to get checked out. Told him told to start back on the Prilosec 40 mg bid if the Dexilant made he feel funny.

## 2011-03-08 NOTE — Discharge Instructions (Signed)
 Your CT scan showed no immediate, emergent problems. If you are concerned about taking your Dexilant  medication, I recommend resuming your prior omeprazole  medication. Please followup with your gastroenterologist next week. I would also recommend that he establish with a family physician as well to discuss any other symptoms as well as to provide ongoing medical care.      Diet for GERD or PUD Nutrition therapy can help ease the discomfort of gastroesophageal reflux disease (GERD) and peptic ulcer disease (PUD).  HOME CARE INSTRUCTIONS   Eat your meals slowly, in a relaxed setting.   Eat 5 to 6 small meals per day.   If a food causes distress, stop eating it for a period of time.  FOODS TO AVOID  Coffee, regular or decaffeinated.   Cola beverages, regular or low calorie.   Tea, regular or decaffeinated.   Pepper.   Cocoa.   High fat foods, including meats.   Butter, margarine, hydrogenated oil (trans fats).   Peppermint or spearmint (if you have GERD).   Fruits and vegetables if not tolerated.   Alcohol.   Nicotine  (smoking or chewing). This is one of the most potent stimulants to acid production in the gastrointestinal tract.   Any food that seems to aggravate your condition.  If you have questions regarding your diet, ask your caregiver or a registered dietitian. TIPS  Lying flat may make symptoms worse. Keep the head of your bed raised 6 to 9 inches (15 to 23 cm) by using a foam wedge or blocks under the legs of the bed.   Do not lay down until 3 hours after eating a meal.   Daily physical activity may help reduce symptoms.  MAKE SURE YOU:   Understand these instructions.   Will watch your condition.   Will get help right away if you are not doing well or get worse.  Document Released: 04/22/2005 Document Revised: 01/02/2011 Document Reviewed: 09/05/2008 [facility] Patient Information 2012 [place], [place].

## 2011-03-08 NOTE — ED Provider Notes (Addendum)
History   This chart was scribed for Donnetta Hutching, MD by Clarita Crane. The patient was seen in room APA07/APA07 and the patient's care was started at 12:23PM.   CSN: [id number] Arrival date & time: 03/08/2011 11:08 AM   First MD Initiated Contact with Patient 03/08/11 1120      Chief Complaint  Patient presents with  . Abdominal Pain  . Emesis   HPI [name] is a 30 y.o. male who presents to the Emergency Department complaining of constant moderate, non-radiating RUQ and LUQ abdominal pain described as cramping onset last night and persistent since with associated n/v. Denies diarrhea, chest pain, SOB, melena. Patient also notes he had his BP measured by the [facility] this morning which he states was measured "high" but is unable to provide a specific value. Patient with h/o GERD, hypertension, anxiety, depression, cholecystectomy and appendectomy. Patient in emergency department approximately 2 days ago for syncopal spell.  Head CT at that time normal  Past Medical History  Diagnosis Date  . GERD (gastroesophageal reflux disease)     Bravo study Nov 2009 on Prilosec 40 BID with adequate acid suppression  . Hypertension   . Anxiety   . Depression     increased over past several months  . History of MRSA infection   . Chronic abdominal pain     HIDA scan Sept 2010, EF 93%, s/p chole oct 2010; evaluated at [facility] for abdominal wall pain; Imipramine added May 2011  . Chronic vomiting     normal GES 2009  . S/P endoscopy     2008 Dr. Karilyn Cota: erosive reflux esophagitis, antral gastritis, H.pylori serologies neg, Nov 2009 Dr. Darrick Penna: gastritis, benign esophageal polyp, no H.pylori,  Feb 2011: [facility], Dr. Bubba Hales: normal esophagus, normal stomach, normal duodenum, path unremarkable    Past Surgical History  Procedure Date  . Ventral hernia repair March 2012    Dr. Lovell Sheehan  . Knee surgery 2009  . Cholecystectomy 10/10  . Appendectomy 5/10  . Esophagogastroduodenoscopy  05/2006    H.pylori neg  . Inguinal hernia repair     child    Family History  Problem Relation Age of Onset  . Colon cancer Neg Hx     History  Substance Use Topics  . Smoking status: Current Everyday Smoker -- 0.5 packs/day  . Smokeless tobacco: Not on file   Comment: trying to quit, down to about 3 cigarettes per day  . Alcohol Use: No      Review of Systems 10 Systems reviewed and are negative for acute change except as noted in the HPI.  Allergies  Propoxyphene n-acetaminophen  Home Medications   Current Outpatient Rx  Name Route Sig Dispense Refill  . ALBUTEROL SULFATE HFA 108 (90 BASE) MCG/ACT IN AERS Inhalation Inhale 2 puffs into the lungs every 6 (six) hours as needed. Shortness of breath     . ALPRAZOLAM 1 MG PO TABS Oral Take 1 mg by mouth 4 (four) times daily.      Marland Kitchen CARBAMAZEPINE 100 MG PO CHEW Oral Chew 100 mg by mouth 4 (four) times daily.      . DEXLANSOPRAZOLE 60 MG PO CPDR Oral Take 1 capsule (60 mg total) by mouth daily. 15 capsule 0  . LISINOPRIL-HYDROCHLOROTHIAZIDE 20-25 MG PO TABS Oral Take 1 tablet by mouth daily.      Marland Kitchen PRAVASTATIN SODIUM 40 MG PO TABS Oral Take 40 mg by mouth daily.      Marland Kitchen PROMETHAZINE HCL 25  MG PO TABS Oral Take 1 tablet (25 mg total) by mouth every 6 (six) hours as needed for nausea. 8 tablet 0  . TOPIRAMATE 100 MG PO TABS Oral Take 100 mg by mouth 2 (two) times daily.      . OMEGA-3 FATTY ACIDS 1000 MG PO CAPS Oral Take 2 g by mouth daily.        BP 139/94  Pulse 97  Temp(Src) 98.9 F (37.2 C) (Oral)  Resp 18  Ht 5\' 9"  (1.753 m)  Wt 298 lb (135.172 kg)  BMI 44.01 kg/m2  SpO2 97%  Physical Exam  Nursing note and vitals reviewed. Constitutional: He is oriented to person, place, and time. He appears well-developed and well-nourished. No distress.       Obese.   HENT:  Head: Normocephalic and atraumatic.  Eyes: EOM are normal. Pupils are equal, round, and reactive to light.  Neck: Neck supple. No tracheal deviation  present.  Cardiovascular: Normal rate and regular rhythm.   No murmur heard. Pulmonary/Chest: Effort normal. No respiratory distress. He has no wheezes.  Abdominal: Soft. Bowel sounds are normal. He exhibits no distension. There is tenderness (minimal) in the left upper quadrant.  Musculoskeletal: Normal range of motion. He exhibits no edema.  Neurological: He is alert and oriented to person, place, and time. No sensory deficit.  Skin: Skin is warm and dry.  Psychiatric: He has a normal mood and affect. His behavior is normal.    ED Course  Procedures (including critical care time)  DIAGNOSTIC STUDIES: Oxygen Saturation is 96% on room air, normal by my interpretation.    COORDINATION OF CARE: 12:25PM- IV fluids. Protonix and Zofran via IV. Blood work.  12:29PM- Patient's father notes patient has been on Xanax-1mg  four times per day for several years but is unsure if the patient has been taking his prescribed Xanax.   Labs Reviewed  CBC - Abnormal; Notable for the following:    WBC 16.2 (*)    Hemoglobin 17.2 (*)    All other components within normal limits  DIFFERENTIAL - Abnormal; Notable for the following:    Neutro Abs 12.4 (*)    Monocytes Absolute 1.2 (*)    All other components within normal limits  COMPREHENSIVE METABOLIC PANEL - Abnormal; Notable for the following:    Sodium 134 (*)    Glucose, Bld 124 (*)    Total Bilirubin 1.6 (*)    All other components within normal limits  LIPASE, BLOOD   Ct Head Wo Contrast  03/06/2011  *RADIOLOGY REPORT*  Clinical Data: Seizure and fall.  Occipital tenderness.  CT HEAD WITHOUT CONTRAST  Technique:  Contiguous axial images were obtained from the base of the skull through the vertex without contrast.  Comparison: None.  Findings: The ventricles and sulci appear symmetrical.  No mass effect or midline shift.  No abnormal extra-axial fluid collections.  Gray-white matter junctions are distinct.  Basal cisterns are not effaced.  No  evidence of acute intracranial hemorrhage.  No depressed skull fractures.  Visualized paranasal sinuses are not opacified.  IMPRESSION: No evidence of acute intracranial hemorrhage, mass lesion, or acute infarct.  Original Report Authenticated By: Marlon Pel, M.D.     No diagnosis found.    MDM  Patient reexamined. He reports taking Xanax 1 mg 4 times a day but none sense Wednesday. Also reports excessive diaphoresis. He is morbidly obese.  Will obtain CT of abdomen and pelvis with contrast. Discuss with Dr. Oletta Lamas.  He  will assume care       I personally performed the services described in this documentation, which was scribed in my presence. The recorded information has been reviewed and considered.   Donnetta Hutching, MD 03/08/11 1536  Donnetta Hutching, MD 03/08/11 [phone]

## 2011-03-11 ENCOUNTER — Other Ambulatory Visit: Payer: Self-pay

## 2011-03-11 DIAGNOSIS — K920 Hematemesis: Secondary | ICD-10-CM

## 2011-03-11 NOTE — Telephone Encounter (Signed)
LMOM for pt to call. Lab order has been faxed to [facility] ([facility]). Routing to [employer] to schedule [place].

## 2011-03-12 ENCOUNTER — Other Ambulatory Visit: Payer: Self-pay | Admitting: Gastroenterology

## 2011-03-12 DIAGNOSIS — R109 Unspecified abdominal pain: Secondary | ICD-10-CM

## 2011-03-12 DIAGNOSIS — R112 Nausea with vomiting, unspecified: Secondary | ICD-10-CM

## 2011-03-12 NOTE — Telephone Encounter (Signed)
Pt is scheduled for ABD [place] on 03/15/11- LMOVM with instructions

## 2011-03-12 NOTE — Telephone Encounter (Signed)
LMOM home and mobile for pt to call.

## 2011-03-12 NOTE — Telephone Encounter (Signed)
Pt had OV on 03/06/2011.

## 2011-03-13 NOTE — Telephone Encounter (Signed)
LMOM to call.

## 2011-03-15 ENCOUNTER — Ambulatory Visit (HOSPITAL_COMMUNITY)
Admission: RE | Admit: 2011-03-15 | Discharge: 2011-03-15 | Disposition: A | Discharge status: Home / Self Care | Payer: Medicaid Other | Source: Ambulatory Visit | Attending: Gastroenterology | Admitting: Gastroenterology

## 2011-03-15 DIAGNOSIS — I10 Essential (primary) hypertension: Secondary | ICD-10-CM | POA: Insufficient documentation

## 2011-03-15 DIAGNOSIS — R112 Nausea with vomiting, unspecified: Secondary | ICD-10-CM | POA: Insufficient documentation

## 2011-03-15 DIAGNOSIS — R109 Unspecified abdominal pain: Secondary | ICD-10-CM | POA: Insufficient documentation

## 2011-03-18 NOTE — Telephone Encounter (Signed)
Called, [facility] for pt to go to [facility] for his LFT's.

## 2011-03-19 ENCOUNTER — Other Ambulatory Visit: Payer: Self-pay | Admitting: Gastroenterology

## 2011-03-20 LAB — HEPATIC FUNCTION PANEL
Albumin: 4.8 g/dL (ref 3.5–5.2)
Total Bilirubin: 0.3 mg/dL (ref 0.3–1.2)

## 2011-03-20 NOTE — Telephone Encounter (Signed)
LATE ENTRY. PER [facility], PT CAME IN FOR LABS YESTERDAY, 03/19/2011. THEY CALLED AND ASKED IF HE STILL HAS [place] INDIGENT BENEFITS. I CALLED AND LEFT MESSAGE FOR BETTY RATLIFF. SHE RETURNED CALL AND SAID HIS BENEFITS EXPIRED IN MAY. I INFORMED LESLIE AT THE LAB.

## 2011-03-21 NOTE — Progress Notes (Signed)
Quick Note:  [place] normal. Likely fatty liver. LFTs were normal as well. How is pt doing now that he is back on a PPI BID? Any further hematemesis? ______

## 2011-03-21 NOTE — Progress Notes (Signed)
Quick Note:  This dose is a little high. He needs to just take 40 BID for now. It works best taking 30 minutes before breakfast and 30 minutes before dinner. He needs to lose weight as well, as this is compounding symptoms. I'd like to see him back in follow-up. May need to try different PPI vs EGD. However, I feel diet and behaviors are contributing to his reflux symptoms. Please make sure he has the GERD diet and is following this. ______

## 2011-03-21 NOTE — Progress Notes (Signed)
Quick Note:  Informed pt of results. He said he is not having any more vomiting or bubbling, and is able to eat. However, he is taking Omeprazole 40 mg, two in the AM, one at lunch and one in the evening. He said he was told at the health dept that it was OK to take an extra one daily. Please advise! ______

## 2011-03-22 NOTE — Progress Notes (Signed)
Quick Note:  Pt was informed. Wanted an appt right away. Scheduled with Tana Coast, PA on 03/26/2011 @ 8:30 AM. Genella Rife diet place in the mail. ______

## 2011-03-25 ENCOUNTER — Telehealth: Payer: Self-pay | Admitting: Gastroenterology

## 2011-03-25 NOTE — Telephone Encounter (Signed)
Pt called to cancel his OV for tomorrow and stated he was not happy with SF and felt his problems were not being addressed properly. He asked to [facility] with NUR and I told him that NUR was no longer in this practice and our doctors and NUR do not see each others patients. He then asked to see [facility] and I explained to him that I would need approval before doing that and [facility] was scheduled out to Jan 2013. I offered him to [facility] with an extender or he could call NUR's office and see if they would accept him or not. He said he would give [place] one more chance and [facility] to see LSL next Monday due to his car being in the shop.

## 2011-03-25 NOTE — Progress Notes (Addendum)
REVIEWED. AGREE.  PT HAS HAD 3 EGDS SINCE 2008. Recent EGD 2011-NO ALARM Sx. WEIGHT STABLE: 302-3 LBS, BMI > 40 SINCE MAY 2011.  RECENT HB > NL(17). AGREE WITH CHANGE TO DEXILANT. NO INDICATION FOR EGD. 3 CT SCANS OF THE ABD/PRLEVIS IN 2012-LAST ONE NOV 2012. MINIMIZE CT SCANS. PT HAS HAD 7 CTS OF HIS ABD/PELVIS SINCE 2010.  RESTART IMIPRAMINE. OPV IN 3 MOS. PT NEEDS E:30 VISIT. IF Sx PERSISTS, RESTART BACLOFEN.

## 2011-03-25 NOTE — Telephone Encounter (Signed)
REVIEWED. AGREE.  PT NEEDS TO CLARIFY IF HE WANTS TO CONTINUE CARE UNDER DR. FIELDS OR DR. Jena Gauss. PT HAS BEEN SEEN AND EVALUATED IN 2009 AND REFERRED TO [facility] FOR A 2ND OPINION. PT HAS CHRONIC ABD PAIN AND GERD.

## 2011-03-26 ENCOUNTER — Ambulatory Visit: Payer: Self-pay | Admitting: Gastroenterology

## 2011-03-26 NOTE — Telephone Encounter (Signed)
I had to [facility] for patient. I said that RMR would not be able to accept him as his patient and if he sees the extender that SF will be his attending provider.  I asked for a return call for him to confirm he was aware of this.

## 2011-03-26 NOTE — Telephone Encounter (Signed)
Noted  

## 2011-04-01 ENCOUNTER — Telehealth: Payer: Self-pay | Admitting: Gastroenterology

## 2011-04-01 ENCOUNTER — Ambulatory Visit: Payer: Self-pay | Admitting: Gastroenterology

## 2011-04-01 NOTE — Telephone Encounter (Signed)
Pt was a no show

## 2011-04-03 MED ORDER — IMIPRAMINE HCL 10 MG PO TABS: 10.0000 mg | ORAL_TABLET | Freq: Every day | ORAL | Status: DC

## 2011-04-03 NOTE — Progress Notes (Signed)
Addended by: Nira Retort on: 04/03/2011 03:42 PM   Modules accepted: Orders

## 2011-04-03 NOTE — Progress Notes (Signed)
See recommendation by Dr. Darrick Penna. I have sent prescription for Imipramine to pharmacy. Needs to start1 by mouth at bedtime for 3 days, 2 by mouth at bedtime for 3 days, then 3 by mouth at bedtime as regular dose.

## 2011-04-04 NOTE — Telephone Encounter (Signed)
Pt NOS last OV and has [facility] to come in on 12/3 at 10 with LSL. Pt is aware

## 2011-04-08 ENCOUNTER — Ambulatory Visit: Payer: Self-pay | Admitting: Gastroenterology

## 2011-04-08 ENCOUNTER — Telehealth: Payer: Self-pay | Admitting: Gastroenterology

## 2011-04-08 NOTE — Telephone Encounter (Signed)
Pt was a no show

## 2011-04-08 NOTE — Progress Notes (Signed)
LMOM for pt to call. 

## 2011-04-09 NOTE — Telephone Encounter (Signed)
I made a note in epic, so we can't schedule the patient w/o asking Dr Darrick Penna first.

## 2011-04-09 NOTE — Telephone Encounter (Signed)
REVIEWED. AGREE. 

## 2011-04-09 NOTE — Telephone Encounter (Signed)
The patient has not kept his last 3 appointments. Missed his  November 20, November 26, December 3 appointments. Please do not reschedule the patient without Dr. Darrick Penna approval.

## 2011-04-10 ENCOUNTER — Other Ambulatory Visit: Payer: Self-pay

## 2011-04-10 NOTE — Progress Notes (Signed)
Informed pt the Rx has been sent in.

## 2011-08-27 ENCOUNTER — Telehealth: Payer: Self-pay | Admitting: Gastroenterology

## 2011-08-27 NOTE — Telephone Encounter (Signed)
Patient called an asked if he can be put back on the schedule again for his reflux & also he is out of his omeprazole. Please advise?

## 2011-08-27 NOTE — Telephone Encounter (Signed)
Please call pt. We will call in 1 mo of Prilosec 40 mg 30 minutes piror to meals bid, #60, [id number]. If he is a no show for his next visit, E 30, 1st available in the next month, he will be discharged from the practice.

## 2011-08-28 NOTE — Telephone Encounter (Signed)
Routing to [place] to schedule and to [place] per her request.

## 2011-08-28 NOTE — Telephone Encounter (Signed)
I have removed the block from the patients account.  You should be able to schedule his office visit.

## 2011-08-28 NOTE — Telephone Encounter (Signed)
Pt returned called and was informed that the prescription will be called in ( to [place]). He was also informed that he will be scheduled for an OV appt per Dr. Darrick Penna, and if he does not keep the appointment, he will be discharged from the practice due to so many no shows. Pt expressed understanding and was very appreciative of Dr. Darrick Penna calling in his meds. I called the prescription to [facility] to [place].

## 2011-08-28 NOTE — Telephone Encounter (Signed)
LMOM ( mobile) for a return call.

## 2011-08-29 ENCOUNTER — Encounter: Payer: Self-pay | Admitting: Gastroenterology

## 2011-08-29 ENCOUNTER — Ambulatory Visit (INDEPENDENT_AMBULATORY_CARE_PROVIDER_SITE_OTHER): Payer: Self-pay | Admitting: Gastroenterology

## 2011-08-29 VITALS — BP 121/80 | HR 81 | Temp 98.0°F | Ht 69.0 in | Wt 307.4 lb

## 2011-08-29 DIAGNOSIS — K219 Gastro-esophageal reflux disease without esophagitis: Secondary | ICD-10-CM

## 2011-08-29 DIAGNOSIS — R1084 Generalized abdominal pain: Secondary | ICD-10-CM

## 2011-08-29 MED ORDER — OMEPRAZOLE 40 MG PO CPDR: DELAYED_RELEASE_CAPSULE | ORAL | Status: DC

## 2011-08-29 MED ORDER — IMIPRAMINE HCL 10 MG PO TABS: ORAL_TABLET | ORAL | Status: DC

## 2011-08-29 NOTE — Progress Notes (Signed)
Reminder in epic to follow up in 3 months with SF in E30 

## 2011-08-29 NOTE — Patient Instructions (Addendum)
LOSE 1-2 LBS A WEEK.  STOP SMOKING.  FOLLOW A LOW FAT DIET. SEE INFO BELOW.  PRILOSEC TWICE DAILY 30 MINUTES BEFORE MEALS.  START IMIPRAMINE 1 AT BEDTIME FOR 3 DAYS THEN 2 AT BEDTIME FOR 3 DAYS THEN 3 AT BEDTIME FOR 3 DAYS. CONTINUE FOR 2 MONTHS. IF YOU THINK IT'S NOT HELPING THEN STOP IT.  FOLLOW UP IN 3 MOS.  Low-Fat Diet BREADS, CEREALS, PASTA, RICE, DRIED PEAS, AND BEANS These products are high in carbohydrates and most are low in fat. Therefore, they can be increased in the diet as substitutes for fatty foods. They too, however, contain calories and should not be eaten in excess. Cereals can be eaten for snacks as well as for breakfast.  Include foods that contain fiber (fruits, vegetables, whole grains, and legumes). Research shows that fiber may lower blood cholesterol levels, especially the water-soluble fiber found in fruits, vegetables, oat products, and legumes. FRUITS AND VEGETABLES It is good to eat fruits and vegetables. Besides being sources of fiber, both are rich in vitamins and some minerals. They help you get the daily allowances of these nutrients. Fruits and vegetables can be used for snacks and desserts. MEATS Limit lean meat, chicken, [place], and fish to no more than 6 ounces per day. Beef, Pork, and Lamb Use lean cuts of beef, pork, and lamb. Lean cuts include:  Extra-lean ground beef.  Arm roast.  Sirloin tip.  Center-cut ham.  Round steak.  Loin chops.  Rump roast.  Tenderloin.  Trim all fat off the outside of meats before cooking. It is not necessary to severely decrease the intake of red meat, but lean choices should be made. Lean meat is rich in protein and contains a highly absorbable form of iron. Premenopausal women, in particular, should avoid reducing lean red meat because this could increase the risk for low red blood cells (iron-deficiency anemia).  Chicken and [place] These are good sources of protein. The fat of poultry can be reduced by removing  the skin and underlying fat layers before cooking. Chicken and [place] can be substituted for lean red meat in the diet. Poultry should not be fried or covered with high-fat sauces. Fish and Shellfish Fish is a good source of protein. Shellfish contain cholesterol, but they usually are low in saturated fatty acids. The preparation of fish is important. Like chicken and [place], they should not be fried or covered with high-fat sauces. EGGS Egg whites contain no fat or cholesterol. They can be eaten often. Try 1 to 2 egg whites instead of whole eggs in recipes or use egg substitutes that do not contain yolk.  MILK AND DAIRY PRODUCTS Use skim or 1% milk instead of 2% or whole milk. Decrease whole milk, natural, and processed cheeses. Use nonfat or low-fat (2%) cottage cheese or low-fat cheeses made from vegetable oils. Choose nonfat or low-fat (1 to 2%) yogurt. Experiment with evaporated skim milk in recipes that call for heavy cream. Substitute low-fat yogurt or low-fat cottage cheese for sour cream in dips and salad dressings. Have at least 2 servings of low-fat dairy products, such as 2 glasses of skim (or 1%) milk each day to help get your daily calcium intake.  FATS AND OILS Butterfat, lard, and beef fats are high in saturated fat and cholesterol. These should be avoided.Vegetable fats do not contain cholesterol. AVOID coconut oil, palm oil, and palm kernel oil, WHICH are very high in saturated fats. These should be limited. These fats are often used  in bakery goods, processed foods, popcorn, oils, and nondairy creamers. Vegetable shortenings and some peanut butters contain hydrogenated oils, which are also saturated fats. Read the labels on these foods and check for saturated vegetable oils.  Desirable liquid vegetable oils are corn oil, cottonseed oil, olive oil, canola oil, safflower oil, soybean oil, and sunflower oil. Peanut oil is not as good, but small amounts are acceptable. Buy a heart-healthy  tub margarine that has no partially hydrogenated oils in the ingredients. AVOID Mayonnaise and salad dressings often are made from unsaturated fats.  OTHER EATING TIPS Snacks  Most sweets should be limited as snacks. They tend to be rich in calories and fats, and their caloric content outweighs their nutritional value. Some good choices in snacks are graham crackers, melba toast, soda crackers, bagels (no egg), English muffins, fruits, and vegetables. These snacks are preferable to snack crackers, [place] fries, and chips. Popcorn should be air-popped or cooked in small amounts of liquid vegetable oil.  Desserts Eat fruit, low-fat yogurt, and fruit ices instead of pastries, cake, and cookies. Sherbet, angel food cake, gelatin dessert, frozen low-fat yogurt, or other frozen products that do not contain saturated fat (pure fruit juice bars, frozen ice pops) are also acceptable.   COOKING METHODS Choose those methods that use little or no fat. They include: Poaching.  Braising.  Steaming.  Grilling.  Baking.  Stir-frying.  Broiling.  Microwaving.  Foods can be cooked in a nonstick pan without added fat, or use a nonfat cooking spray in regular cookware. Limit fried foods and avoid frying in saturated fat. Add moisture to lean meats by using water, broth, cooking wines, and other nonfat or low-fat sauces along with the cooking methods mentioned above. Soups and stews should be chilled after cooking. The fat that forms on top after a few hours in the refrigerator should be skimmed off. When preparing meals, avoid using excess salt. Salt can contribute to raising blood pressure in some people.  EATING AWAY FROM HOME Order entres, potatoes, and vegetables without sauces or butter. When meat exceeds the size of a deck of cards (3 to 4 ounces), the rest can be taken home for another meal. Choose vegetable or fruit salads and ask for low-calorie salad dressings to be served on the side. Use dressings  sparingly. Limit high-fat toppings, such as bacon, crumbled eggs, cheese, sunflower seeds, and olives. Ask for heart-healthy tub margarine instead of butter.

## 2011-08-29 NOTE — Progress Notes (Signed)
Subjective:    Patient ID: [name], male    DOB: 03-23-1981, 30 y.o.   MRN: [id number]  PCP: NONE  HPI PT HAS BEEN SEEN SINCE 2009 FOR GERD. HE HAD A BRAVO STUDY THAT SHOWED ADEQUATE ACID SUPPRESSION ON OMP 40 MG BID. HE WEIGHED 256 LBS. HE HAS HAD 3 EGDS SINCE 2008. Recent EGD 2011-NO ALARM Sx. WEIGHT STABLE: 302-3 LBS, BMI > 40 SINCE MAY 2011. RECENT HB > NL(17). PT HAS HAD 7 CTS OF HIS ABD/PELVIS SINCE 2010. HE HAS HAD 3 CT SCANS OF THE ABD/PELVIS IN 2012-LAST ONE NOV 2012.   C/O REGURGITATION YELLOW SECRETIONS AND FOAMY WHITE STUFF. STATES HE CAN'T EAT ANYTHING AND DRINKS 2-3 GALLONS OF WATER A DAY. GAINED 5 LBS SINCE NOV 2012. STATES HE'S RETAINING FLUID. C/O BIL UPPER FLANK PAIN. GAINED 51 LBS SINCE 2009.   Past Medical History  Diagnosis Date  . GERD (gastroesophageal reflux disease)     Bravo study Nov 2009 on Prilosec 40 BID with adequate acid suppression  . Hypertension   . Anxiety   . Depression     increased over past several months  . History of MRSA infection   . Chronic abdominal pain     HIDA scan Sept 2010, EF 93%, s/p chole oct 2010; evaluated at [facility] for abdominal wall pain; Imipramine added May 2011  . Chronic vomiting     normal GES 2009  . S/P endoscopy     2008 Dr. Karilyn Cota: erosive reflux esophagitis, antral gastritis, H.pylori serologies neg, Nov 2009 Dr. Darrick Penna: gastritis, benign esophageal polyp, no H.pylori,  Feb 2011: [facility], Dr. Bubba Hales: normal esophagus, normal stomach, normal duodenum, path unremarkable    Past Surgical History  Procedure Date  . Ventral hernia repair March 2012    Dr. Lovell Sheehan  . Knee surgery 2009  . Cholecystectomy 10/10  . Appendectomy 5/10  . Esophagogastroduodenoscopy 05/2006    H.pylori neg  . Inguinal hernia repair     child    Allergies  Allergen Reactions  . Propoxacet-N Other (See Comments)    Does not know     Current Outpatient Prescriptions  Medication Sig Dispense Refill  . albuterol (PROVENTIL  HFA;VENTOLIN HFA) 108 (90 BASE) MCG/ACT inhaler Inhale 2 puffs into the lungs every 6 (six) hours as needed. Shortness of breath       . ALPRAZolam (XANAX) 1 MG tablet Take 1 mg by mouth 4 (four) times daily.        . carbamazepine (TEGRETOL) 100 MG chewable tablet Chew 100 mg by mouth 4 (four) times daily.        Marland Kitchen lisinopril-hydrochlorothiazide (PRINZIDE,ZESTORETIC) 20-25 MG per tablet Take 1 tablet by mouth daily.        . pravastatin (PRAVACHOL) 40 MG tablet Take 40 mg by mouth daily.        Marland Kitchen topiramate (TOPAMAX) 100 MG tablet Take 100 mg by mouth 2 (two) times daily.        . fish oil-omega-3 fatty acids 1000 MG capsule Take 2 g by mouth daily.        .      . omeprazole (PRILOSEC) 40 MG capsule 1 PO 30 MINUTES PRIOR TO MEALS BID  62 capsule  5  .           Review of Systems     Objective:   Physical Exam  Vitals reviewed. Constitutional: He is oriented to person, place, and time. No distress.  VERY TAN  HENT:  Head: Normocephalic and atraumatic.  Mouth/Throat: Oropharynx is clear and moist. No oropharyngeal exudate.       TONGUE RING IN PLACE  Eyes: Pupils are equal, round, and reactive to light. No scleral icterus.  Neck: Normal range of motion. Neck supple.  Cardiovascular: Normal rate, regular rhythm and normal heart sounds.   Pulmonary/Chest: Effort normal and breath sounds normal. No respiratory distress.  Abdominal: Soft. Bowel sounds are normal. He exhibits no distension. There is no tenderness.  Musculoskeletal: Normal range of motion. He exhibits no edema.  Neurological: He is alert and oriented to person, place, and time.       NO  NEW FOCAL DEFICITS           Assessment & Plan:

## 2011-08-29 NOTE — Assessment & Plan Note (Addendum)
SX NOT IDEALLY CONTROLLED ACCORDING TO PT. BMI HAS INCREASED FROM 37 TO 45 SINCE 2009. PT NOW WEIGHS 307 LBS.  EXPLAINED TO PT HE NEEDS TO MODIFY HIS LIFESTYLE RISK FACTORS. HE BELIEVES HIS WEIGHT IS NOT AN ISSUE. I EXPLAINED HIS WEIGHT IS AN ISSUE BECAUSE IT IS ASSOCIATED WITH REFLUX THAT IS HARD TO CONTROL & HE CANNOT HAVE REFLUX SURGERY IF HIS BMI IS > 35 SO HIS WEIGHT IS AN ISSUE. HE SHOULD ALSO-STOP SMOKING, LOSE WEIGHT, AND FOLLOW A LOW FAT DIET. HO GIVEN. OPV IN 3 MOS.  CANNOT START BACLOFEN. PT UNINSURED AND PAYS CASH FOR MEDS.

## 2011-08-29 NOTE — Telephone Encounter (Signed)
Pt had OV today with SF and will FU in 3 months

## 2011-08-29 NOTE — Assessment & Plan Note (Signed)
   MINIMIZE CT SCANS.   RESTART IMIPRAMINE.  OPV IN 3 MOS.  CANNOT START BACLOFEN. PT UNINSURED AND PAYS CASH FOR MEDS.

## 2011-08-29 NOTE — Progress Notes (Signed)
No PCP on file 

## 2011-10-21 ENCOUNTER — Encounter: Payer: Self-pay | Admitting: Gastroenterology

## 2011-10-22 ENCOUNTER — Emergency Department (HOSPITAL_COMMUNITY)
Admission: EM | Admit: 2011-10-22 | Discharge: 2011-10-22 | Disposition: A | Discharge status: Home / Self Care | Payer: Medicaid Other | Attending: Emergency Medicine | Admitting: Emergency Medicine

## 2011-10-22 ENCOUNTER — Encounter (HOSPITAL_COMMUNITY): Payer: Self-pay

## 2011-10-22 ENCOUNTER — Emergency Department (HOSPITAL_COMMUNITY): Payer: Medicaid Other

## 2011-10-22 DIAGNOSIS — I10 Essential (primary) hypertension: Secondary | ICD-10-CM | POA: Insufficient documentation

## 2011-10-22 DIAGNOSIS — K439 Ventral hernia without obstruction or gangrene: Secondary | ICD-10-CM | POA: Insufficient documentation

## 2011-10-22 DIAGNOSIS — R079 Chest pain, unspecified: Secondary | ICD-10-CM | POA: Insufficient documentation

## 2011-10-22 DIAGNOSIS — R109 Unspecified abdominal pain: Secondary | ICD-10-CM | POA: Insufficient documentation

## 2011-10-22 DIAGNOSIS — R1013 Epigastric pain: Secondary | ICD-10-CM

## 2011-10-22 DIAGNOSIS — Z9089 Acquired absence of other organs: Secondary | ICD-10-CM | POA: Insufficient documentation

## 2011-10-22 DIAGNOSIS — G8929 Other chronic pain: Secondary | ICD-10-CM | POA: Insufficient documentation

## 2011-10-22 DIAGNOSIS — R112 Nausea with vomiting, unspecified: Secondary | ICD-10-CM

## 2011-10-22 LAB — CBC
HCT: 44.7 % (ref 39.0–52.0)
Hemoglobin: 15.5 g/dL (ref 13.0–17.0)
RDW: 12.6 % (ref 11.5–15.5)
WBC: 10 10*3/uL (ref 4.0–10.5)

## 2011-10-22 LAB — BASIC METABOLIC PANEL
BUN: 14 mg/dL (ref 6–23)
Chloride: 102 mEq/L (ref 96–112)
GFR calc Af Amer: >90 mL/min (ref >90)
Potassium: 3.6 mEq/L (ref 3.5–5.1)

## 2011-10-22 LAB — DIFFERENTIAL
Basophils Absolute: 0 10*3/uL (ref 0.0–0.1)
Lymphocytes Relative: 30 % (ref 12–46)
Monocytes Absolute: 0.7 10*3/uL (ref 0.1–1.0)
Monocytes Relative: 7 % (ref 3–12)
Neutro Abs: 6.1 10*3/uL (ref 1.7–7.7)

## 2011-10-22 MED ORDER — PROMETHAZINE HCL 25 MG PO TABS: 25.0000 mg | ORAL_TABLET | Freq: Four times a day (QID) | ORAL | Status: DC | PRN

## 2011-10-22 MED ORDER — ONDANSETRON HCL 4 MG/2ML IJ SOLN
INTRAMUSCULAR | Status: AC
  Administered 2011-10-22: 4 mg via INTRAVENOUS
  Filled 2011-10-22: qty 2

## 2011-10-22 MED ORDER — ONDANSETRON HCL 4 MG/2ML IJ SOLN
4.0000 mg | Freq: Once | INTRAMUSCULAR | Status: AC
  Administered 2011-10-22: 4 mg via INTRAVENOUS
  Filled 2011-10-22: qty 2

## 2011-10-22 MED ORDER — ONDANSETRON HCL 4 MG/2ML IJ SOLN: 4.0000 mg | Freq: Once | INTRAMUSCULAR | Status: DC

## 2011-10-22 MED ORDER — SODIUM CHLORIDE 0.9 % IV BOLUS (SEPSIS)
250.0000 mL | Freq: Once | INTRAVENOUS | Status: AC
  Administered 2011-10-22: 18:00:00 via INTRAVENOUS

## 2011-10-22 MED ORDER — HYDROCODONE-ACETAMINOPHEN 5-325 MG PO TABS: 1.0000 | ORAL_TABLET | Freq: Four times a day (QID) | ORAL | Status: AC | PRN

## 2011-10-22 MED ORDER — SODIUM CHLORIDE 0.9 % IV SOLN: INTRAVENOUS | Status: DC

## 2011-10-22 MED ORDER — HYDROMORPHONE HCL PF 1 MG/ML IJ SOLN
1.0000 mg | Freq: Once | INTRAMUSCULAR | Status: AC
  Administered 2011-10-22: 1 mg via INTRAVENOUS
  Filled 2011-10-22: qty 1

## 2011-10-22 MED ORDER — ONDANSETRON HCL 4 MG/2ML IJ SOLN
4.0000 mg | Freq: Once | INTRAMUSCULAR | Status: AC
  Administered 2011-10-22: 4 mg via INTRAVENOUS

## 2011-10-22 MED ORDER — IOHEXOL 300 MG/ML  SOLN
100.0000 mL | Freq: Once | INTRAMUSCULAR | Status: AC | PRN
  Administered 2011-10-22: 100 mL via INTRAVENOUS

## 2011-10-22 MED ORDER — PANTOPRAZOLE SODIUM 40 MG IV SOLR
40.0000 mg | Freq: Once | INTRAVENOUS | Status: AC
  Administered 2011-10-22: 40 mg via INTRAVENOUS
  Filled 2011-10-22: qty 40

## 2011-10-22 NOTE — Discharge Instructions (Signed)
followup with her GI Dr. Berneda Rose to take the Phenergan hydrocodone provided for the abdominal pain. Followup with Dr. Lovell Sheehan from Gen. Surgery to have the hernias evaluated. Return for new or worse symptoms.

## 2011-10-22 NOTE — ED Notes (Signed)
Pt reports has had nausea and vomiting since Friday.   C/O indigestion and hurting in chest.

## 2011-10-22 NOTE — ED Notes (Signed)
Patient report given to this nurse. Assuming care of patient. In no distress. Pain 7\10. Remains nauseated. Would like something for both. Equal chest rise and fall, regular, unlabored. Call bell within reach.

## 2011-10-22 NOTE — ED Provider Notes (Signed)
History   This chart was scribed for Shelda Jakes, MD by Shari Heritage. The patient was seen in room APA05/APA05. Patient's care was started at 1557.     CSN: [id number]  Arrival date & time 10/22/11  1557   First MD Initiated Contact with Patient 10/22/11 1616      Chief Complaint  Patient presents with  . Emesis  . Gastrophageal Reflux    (Consider location/radiation/quality/duration/timing/severity/associated sxs/prior treatment) Patient is a 31 y.o. male presenting with vomiting. The history is provided by the patient. No language interpreter was used.  Emesis  This is a new problem. The current episode started more than 2 days ago. The problem occurs 5 to 10 times per day. The problem has been gradually worsening. The emesis has an appearance of stomach contents. There has been no fever. Associated symptoms include abdominal pain. Pertinent negatives include no chills, no cough, no diarrhea, no fever and no headaches.   ELIYOHU CLASS is a 31 y.o. male who presents to the Emergency Department complaining of nausea and vomiting onset 4 days ago with associated abdominal and chest pain. Patient says that he bagan experiencing the abdominal pain before nausea and vomiting. Patient describes pain in chest as burning. Patient says that he doesn't normally have nausea and vomiting and that it is controlled with Phenergan, but it has not been relieving the N/V. Patient says he had emesis x7 today.  Patient with h/o hernia on the RLQ of the abdomen and hernia repair surgery in March 2012 at [facility] Patient says he noticed a new bulge with associated pain in his abdomen 6-8 weeks ago and grew concerned about fluid accumulation or a new hernia.  Patient denies for HA, fever, chills, neck pain, back pain, dysuria, visual disturbance, rash, or excessive bleeding. Patient with h/o of GERD, HTN, anxiety, depression, MRSA, chronic abdominal pain, chronic vomiting. Patient with surgical h/o  ventral hernia repair, knee surgery, cholecystectomy, inguinal hernia repair (childhood), esophagogastroduodenoscopy.  Patient has no PCP. Patient sees Dr. Darrick Penna (GI specialist in [facility]) for reflux. [facility].  Past Medical History  Diagnosis Date  . GERD (gastroesophageal reflux disease)     Bravo study Nov 2009 on Prilosec 40 BID with adequate acid suppression  . Hypertension   . Anxiety   . Depression     increased over past several months  . History of MRSA infection   . Chronic abdominal pain     HIDA scan Sept 2010, EF 93%, s/p chole oct 2010; evaluated at [facility] for abdominal wall pain; Imipramine added May 2011  . Chronic vomiting     normal GES 2009  . S/P endoscopy     2008 Dr. Karilyn Cota: erosive reflux esophagitis, antral gastritis, H.pylori serologies neg, Nov 2009 Dr. Darrick Penna: gastritis, benign esophageal polyp, no H.pylori,  Feb 2011: [facility], Dr. Bubba Hales: normal esophagus, normal stomach, normal duodenum, path unremarkable    Past Surgical History  Procedure Date  . Ventral hernia repair March 2012    Dr. Lovell Sheehan  . Knee surgery 2009  . Cholecystectomy 10/10  . Appendectomy 5/10  . Esophagogastroduodenoscopy 05/2006    H.pylori neg  . Inguinal hernia repair     child    Family History  Problem Relation Age of Onset  . Colon cancer Neg Hx     History  Substance Use Topics  . Smoking status: Current Everyday Smoker -- 0.5 packs/day  . Smokeless tobacco: Not on  file   Comment: trying to quit, down to about 3 cigarettes per day  . Alcohol Use: No      Review of Systems  Constitutional: Negative for fever and chills.  HENT: Negative for neck pain.   Eyes: Negative for visual disturbance.  Respiratory: Negative for cough.   Cardiovascular: Positive for chest pain.  Gastrointestinal: Positive for nausea, vomiting and abdominal pain. Negative for diarrhea.  Genitourinary: Negative for  dysuria.  Musculoskeletal: Negative for back pain.  Skin: Negative for rash.  Neurological: Negative for headaches.   Patient is positive for nausea, vomiting, abdominal pain, chest pain. Patient is negative for HA, fever, chills, neck pain, back pain, dysuria, rash, no bleeding problems, visual disturbance.  Allergies  Propoxyphene-acetaminophen  Home Medications   Current Outpatient Rx  Name Route Sig Dispense Refill  . ALBUTEROL SULFATE HFA 108 (90 BASE) MCG/ACT IN AERS Inhalation Inhale 2 puffs into the lungs every 6 (six) hours as needed. Shortness of breath     . ALPRAZOLAM 1 MG PO TABS Oral Take 1 mg by mouth 4 (four) times daily.      Marlin Canary HEADACHE PO Oral Take 1 packet by mouth once a week. As needed for headache    . CARBAMAZEPINE 100 MG PO CHEW Oral Chew 100 mg by mouth 4 (four) times daily.      Marland Kitchen LISINOPRIL-HYDROCHLOROTHIAZIDE 20-25 MG PO TABS Oral Take 1 tablet by mouth daily.      Marland Kitchen OMEPRAZOLE 40 MG PO CPDR Oral Take 40 mg by mouth 6 (six) times daily. 1 PO 30 MINUTES PRIOR TO MEALS BID    . PRAVASTATIN SODIUM 40 MG PO TABS Oral Take 40 mg by mouth daily.      Marland Kitchen PROMETHAZINE HCL 25 MG PO TABS Oral Take 25 mg by mouth every 6 (six) hours as needed.    . TOPIRAMATE 100 MG PO TABS Oral Take 100 mg by mouth 2 (two) times daily.      Marland Kitchen HYDROCODONE-ACETAMINOPHEN 5-325 MG PO TABS Oral Take 1-2 tablets by mouth every 6 (six) hours as needed for pain. 10 tablet 0  . PROMETHAZINE HCL 25 MG PO TABS Oral Take 1 tablet (25 mg total) by mouth every 6 (six) hours as needed for nausea. 8 tablet 0  . PROMETHAZINE HCL 25 MG PO TABS Oral Take 1 tablet (25 mg total) by mouth every 6 (six) hours as needed for nausea. 12 tablet 0    BP 155/87  Pulse 98  Temp 98.1 F (36.7 C) (Oral)  Resp 20  Ht 5\' 9"  (1.753 m)  Wt 290 lb (131.543 kg)  BMI 42.83 kg/m2  SpO2 98%  Physical Exam  Nursing note and vitals reviewed. Constitutional: He is oriented to person, place, and time. He appears  well-developed and well-nourished.  HENT:  Head: Normocephalic and atraumatic.  Eyes: Conjunctivae and EOM are normal. Pupils are equal, round, and reactive to light.  Cardiovascular: Normal rate and regular rhythm.   No murmur heard. Pulmonary/Chest: Effort normal and breath sounds normal. No respiratory distress. He has no wheezes. He has no rales.  Abdominal: Soft. Bowel sounds are normal.       Do not appreciate an abdominal mass.  Musculoskeletal: Normal range of motion.  Neurological: He is alert and oriented to person, place, and time.  Skin: Skin is warm and dry.       4-5 cm well healed incision above belly button, midline.   Psychiatric: He has a normal mood  and affect.    ED Course  Procedures (including critical care time) DIAGNOSTIC STUDIES: Oxygen Saturation is 98% on room air, normal by my interpretation.   BP: 138/87  COORDINATION OF CARE: 5:00PM- Patient informed of current plan for treatment and evaluation and agrees with plan at this time.   Results for orders placed during the hospital encounter of 10/22/11  CBC      Component Value Range   WBC 10.0  4.0 - 10.5 K/uL   RBC 4.91  4.22 - 5.81 MIL/uL   Hemoglobin 15.5  13.0 - 17.0 g/dL   HCT 24.4  01.0 - 27.2 %   MCV 91.0  78.0 - 100.0 fL   MCH 31.6  26.0 - 34.0 pg   MCHC 34.7  30.0 - 36.0 g/dL   RDW 53.6  64.4 - 03.4 %   Platelets 227  150 - 400 K/uL  DIFFERENTIAL      Component Value Range   Neutrophils Relative 62  43 - 77 %   Neutro Abs 6.1  1.7 - 7.7 K/uL   Lymphocytes Relative 30  12 - 46 %   Lymphs Abs 2.9  0.7 - 4.0 K/uL   Monocytes Relative 7  3 - 12 %   Monocytes Absolute 0.7  0.1 - 1.0 K/uL   Eosinophils Relative 2  0 - 5 %   Eosinophils Absolute 0.2  0.0 - 0.7 K/uL   Basophils Relative 0  0 - 1 %   Basophils Absolute 0.0  0.0 - 0.1 K/uL   WBC Morphology ATYPICAL LYMPHOCYTES    BASIC METABOLIC PANEL      Component Value Range   Sodium 136  135 - 145 mEq/L   Potassium 3.6  3.5 - 5.1  mEq/L   Chloride 102  96 - 112 mEq/L   CO2 23  19 - 32 mEq/L   Glucose, Bld 101 (*) 70 - 99 mg/dL   BUN 14  6 - 23 mg/dL   Creatinine, Ser 7.42  0.50 - 1.35 mg/dL   Calcium 9.4  8.4 - 59.5 mg/dL   GFR calc non Af Amer >90  >90 mL/min   GFR calc Af Amer >90  >90 mL/min  TROPONIN I      Component Value Range   Troponin I <0.30  <0.30 ng/mL   Dg Chest 2 View  10/22/2011  *RADIOLOGY REPORT*  Clinical Data: Vomiting.  Gastroesophageal reflux.  CHEST - 2 VIEW  Comparison: 01/10/2009  Findings: Heart size is normal.  Both lungs are clear.  No evidence of pleural effusion.  No mass or lymphadenopathy identified.  No significant change compared to prior exam.  IMPRESSION: Stable exam.  No active disease.  Original Report Authenticated By: Danae Orleans, M.D.   Ct Abdomen Pelvis W Contrast  10/22/2011  *RADIOLOGY REPORT*  Clinical Data: Nausea and vomiting.  History of GE reflux disease. Known anterior abdominal wall hernia.  Surgical history includes inguinal hernia repair, ventral hernia repair, cholecystectomy, and appendectomy.  CT ABDOMEN AND PELVIS WITH CONTRAST  Technique:  Multidetector CT imaging of the abdomen and pelvis was performed following the standard protocol during bolus administration of intravenous contrast.  Contrast: OMNIPAQUE IOHEXOL 300 MG/ML.  Oral contrast was also administered.  Comparison: CT abdomen and pelvis 03/08/2011, 10/21/2010, 08/25/2010, 04/29/2010, 02/08/2009, 10/09/2008.  Findings: At least 4 small defects in the rectus sheath in the supraumbilical midline, extending over an approximate 5.0 cm length, to the level of the umbilicus. 2 of  these fat-containing hernia is containing edematous/inflamed fat, and there is similar edema/inflammation in the anterior omentum.  No evidence of bowel with the beneath the abdominal wall hernias.  Normal appearing liver, spleen, pancreas, adrenal glands, and kidneys.  Gallbladder surgically absent.  No biliary ductal dilation. No  visible aorto-iliofemoral atherosclerosis.  Patent visceral arteries.  No significant lymphadenopathy.  Normal-appearing stomach containing food.  No evidence of hiatal hernia.  Normal-appearing small bowel and colon.  No ascites. Urinary bladder unremarkable.  Prostate gland and seminal vesicles normal for age.  Phleboliths low in both sides of the pelvis.  Bone window images demonstrate bilateral L5 pars defects without slip. Visualized lung bases clear.  Heart size normal.  IMPRESSION:  1.  At least 4 small anterior abdominal wall hernias in the midline, extending over an approximate 5 cm length from the umbilicus superiorly.  2 of the hernias contain edematous/inflamed fat, and there is similar inflamed fat in the anterior omentum 2.  No evidence of bowel within any of the hernias. 3.  No acute abnormalities otherwise involving the abdomen pelvis. 4.  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  Original Report Authenticated By: Arnell Sieving, M.D.       Date: 10/22/2011  Rate: 92  Rhythm: normal sinus rhythm  QRS Axis: normal  Intervals: normal  ST/T Wave abnormalities: normal  Conduction Disutrbances:none  Narrative Interpretation:   Old EKG Reviewed: none available    1. EPIGASTRIC PAIN, CHRONIC   2. Nausea with vomiting   3. Abdominal wall pain   4. Ventral hernia       MDM   Workup today without any sniffing any new findings other than the finding of several small ventral hernias patient said hernia repair on the right side of the abdomen and past and has an incision above the umbilicus as were most of these hernias are they do not have any incarceration of bowel no bowel obstruction. Rest of his complaints mostly related to his significant GERD and reflux and esophagitis for which she's followed by GI medicine. Overall patient improved here and vomiting under control. Patient's previous hernia repairs were done by Dr. Lovell Sheehan he'll be referred back to him for evaluation and be  referred back to his GI doctor.     I personally performed the services described in this documentation, which was scribed in my presence. The recorded information has been reviewed and considered.     Shelda Jakes, MD 10/22/11 [phone]

## 2011-10-22 NOTE — ED Notes (Signed)
MD at bedside with patient to discuss plan of care. 

## 2011-10-22 NOTE — ED Notes (Signed)
Pt abd pain, chest pain, n/v that started over the past few days, indigestion, pt ate lunch vegetable soup for lunch,

## 2011-10-22 NOTE — ED Notes (Signed)
Discharge instructions given. Verbal teach back given.

## 2011-11-12 ENCOUNTER — Telehealth: Payer: Self-pay | Admitting: Urgent Care

## 2011-11-12 ENCOUNTER — Ambulatory Visit (INDEPENDENT_AMBULATORY_CARE_PROVIDER_SITE_OTHER): Payer: Self-pay | Admitting: Urgent Care

## 2011-11-12 ENCOUNTER — Encounter: Payer: Self-pay | Admitting: Urgent Care

## 2011-11-12 VITALS — BP 120/77 | HR 96 | Temp 98.0°F | Ht 69.0 in | Wt 297.2 lb

## 2011-11-12 DIAGNOSIS — K219 Gastro-esophageal reflux disease without esophagitis: Secondary | ICD-10-CM

## 2011-11-12 DIAGNOSIS — K439 Ventral hernia without obstruction or gangrene: Secondary | ICD-10-CM | POA: Insufficient documentation

## 2011-11-12 DIAGNOSIS — K469 Unspecified abdominal hernia without obstruction or gangrene: Secondary | ICD-10-CM

## 2011-11-12 DIAGNOSIS — R1013 Epigastric pain: Secondary | ICD-10-CM

## 2011-11-12 DIAGNOSIS — R112 Nausea with vomiting, unspecified: Secondary | ICD-10-CM

## 2011-11-12 DIAGNOSIS — R109 Unspecified abdominal pain: Secondary | ICD-10-CM

## 2011-11-12 DIAGNOSIS — E669 Obesity, unspecified: Secondary | ICD-10-CM

## 2011-11-12 NOTE — Patient Instructions (Addendum)
Follow up with Dr. Lovell Sheehan regarding your hernias Low-fat, low-cholesterol diet and we will set you up with the dietitian Be sure to walk every day, arch slowly around 15 minutes and worked her way up to an hour Continue omeprazole 40 mg twice a day  Although exercise and lifestyle factors are important, your diet is key. That is because certain foods are known to raise cholesterol and others to lower it. The goal is to balance foods for their effect on cholesterol and more importantly, to replace saturated and trans fat with other types of fat, such as monounsaturated fat, polyunsaturated fat, and omega-3 fatty acids. On average, a person should consume no more than 15 to 17 g of saturated fat daily. Saturated and trans fats are considered "bad" fats, and they will raise LDL cholesterol. Saturated fats are primarily found in animal products such as meats, butter, and cream. However, that does not mean you need to sacrifice all your favorite foods. Today, there are good tasting, low-fat, low-cholesterol substitutes for most of the things you like to eat. Choose low-fat or nonfat alternatives. Choose round or loin cuts of red meat, since these types of cuts are lowest in fat and cholesterol. Chicken (without the skin), fish, veal, and ground [place] breast are excellent choices. Eliminate fatty meats, such as hot dogs and salami. Even shellfish have little or no saturated fat. Have a 3 oz (85 g) portion when you eat lean meat, poultry, or fish. Trans fats are also called "partially hydrogenated oils." They are oils that have been scientifically manipulated so that they are solid at room temperature resulting in a longer shelf life and improved taste and texture of foods in which they are added. Trans fats are found in stick margarine, some tub margarines, cookies, crackers, and baked goods.  When baking and cooking, oils are an excellent substitute for butter. The monounsaturated oils are especially beneficial  since it is believed they lower LDL and raise HDL. The oils you should avoid entirely are saturated tropical oils, such as coconut and palm.  Remember to eat liberally from food groups that are naturally free of saturated and trans fat, including fish, fruit, vegetables, beans, grains (barley, rice, couscous, bulgur wheat), and pasta (without cream sauces).  IDENTIFYING FOODS THAT LOWER CHOLESTEROL  Soluble fiber may lower your cholesterol. This type of fiber is found in fruits such as apples, vegetables such as broccoli, potatoes, and carrots, legumes such as beans, peas, and lentils, and grains such as barley. Foods fortified with plant sterols (phytosterol) may also lower cholesterol. You should eat at least 2 g per day of these foods for a cholesterol lowering effect.  Read package labels to identify low-saturated fats, trans fats free, and low-fat foods at the supermarket. Select cheeses that have only 2 to 3 g saturated fat per ounce. Use a heart-healthy tub margarine that is free of trans fats or partially hydrogenated oil. When buying baked goods (cookies, crackers), avoid partially hydrogenated oils. Breads and muffins should be made from whole grains (whole-wheat or whole oat flour, instead of "flour" or "enriched flour"). Buy non-creamy canned soups with reduced salt and no added fats.  FOOD PREPARATION TECHNIQUES  Never deep-fry. If you must fry, either stir-fry, which uses very little fat, or use non-stick cooking sprays. When possible, broil, bake, or roast meats, and steam vegetables. Instead of dressing vegetables with butter or margarine, use lemon and herbs, applesauce and cinnamon (for squash and sweet potatoes), nonfat yogurt, salsa, and low-fat  dressings for salads.  LOW-SATURATED FAT / LOW-FAT FOOD SUBSTITUTES Meats / Saturated Fat (g)  Avoid: Steak, marbled (3 oz/85 g) / 11 g   Choose: Steak, lean (3 oz/85 g) / 4 g   Avoid: Hamburger (3 oz/85 g) / 7 g   Choose: Hamburger, lean  (3 oz/85 g) / 5 g   Avoid: Ham (3 oz/85 g) / 6 g   Choose: Ham, lean cut (3 oz/85 g) / 2.4 g   Avoid: Chicken, with skin, dark meat (3 oz/85 g) / 4 g   Choose: Chicken, skin removed, dark meat (3 oz/85 g) / 2 g   Avoid: Chicken, with skin, light meat (3 oz/85 g) / 2.5 g   Choose: Chicken, skin removed, light meat (3 oz/85 g) / 1 g  Dairy / Saturated Fat (g)  Avoid: Whole milk (1 cup) / 5 g   Choose: Low-fat milk, 2% (1 cup) / 3 g   Choose: Low-fat milk, 1% (1 cup) / 1.5 g   Choose: Skim milk (1 cup) / 0.3 g   Avoid: Hard cheese (1 oz/28 g) / 6 g   Choose: Skim milk cheese (1 oz/28 g) / 2 to 3 g   Avoid: Cottage cheese, 4% fat (1 cup) / 6.5 g   Choose: Low-fat cottage cheese, 1% fat (1 cup) / 1.5 g   Avoid: Ice cream (1 cup) / 9 g   Choose: Sherbet (1 cup) / 2.5 g   Choose: Nonfat frozen yogurt (1 cup) / 0.3 g   Choose: Frozen fruit bar / trace   Avoid: Whipped cream (1 tbs) / 3.5 g   Choose: Nondairy whipped topping (1 tbs) / 1 g  Condiments / Saturated Fat (g)  Avoid: Mayonnaise (1 tbs) / 2 g   Choose: Low-fat mayonnaise (1 tbs) / 1 g   Avoid: Butter (1 tbs) / 7 g   Choose: Extra light margarine (1 tbs) / 1 g   Avoid: Coconut oil (1 tbs) / 11.8 g   Choose: Olive oil (1 tbs) / 1.8 g   Choose: Corn oil (1 tbs) / 1.7 g   Choose: Safflower oil (1 tbs) / 1.2 g   Choose: Sunflower oil (1 tbs) / 1.4 g   Choose: Soybean oil (1 tbs) / 2.4 g   Choose: Canola oil (1 tbs) / 1 g  Document Released: 04/22/2005 Document Revised: 04/11/2011 Document Reviewed: 10/11/2010 [facility] Patient Information 2012 [place], [place].Gastroesophageal Reflux Disease, Adult Gastroesophageal reflux disease (GERD) happens when acid from your stomach flows up into the esophagus. When acid comes in contact with the esophagus, the acid causes soreness (inflammation) in the esophagus. Over time, GERD may create small holes (ulcers) in the lining of the esophagus. CAUSES   Increased  body weight. This puts pressure on the stomach, making acid rise from the stomach into the esophagus.   Smoking. This increases acid production in the stomach.   Drinking alcohol. This causes decreased pressure in the lower esophageal sphincter (valve or ring of muscle between the esophagus and stomach), allowing acid from the stomach into the esophagus.   Late evening meals and a full stomach. This increases pressure and acid production in the stomach.   A malformed lower esophageal sphincter.  Sometimes, no cause is found. SYMPTOMS   Burning pain in the lower part of the mid-chest behind the breastbone and in the mid-stomach area. This may occur twice a week or more often.   Trouble swallowing.   Sore  throat.   Dry cough.   Asthma-like symptoms including chest tightness, shortness of breath, or wheezing.  DIAGNOSIS  Your caregiver may be able to diagnose GERD based on your symptoms. In some cases, X-rays and other tests may be done to check for complications or to check the condition of your stomach and esophagus. TREATMENT  Your caregiver may recommend over-the-counter or prescription medicines to help decrease acid production. Ask your caregiver before starting or adding any new medicines.  HOME CARE INSTRUCTIONS   Change the factors that you can control. Ask your caregiver for guidance concerning weight loss, quitting smoking, and alcohol consumption.   Avoid foods and drinks that make your symptoms worse, such as:   Caffeine or alcoholic drinks.   Chocolate.   Peppermint or mint flavorings.   Garlic and onions.   Spicy foods.   Citrus fruits, such as oranges, lemons, or limes.   Tomato-based foods such as sauce, chili, salsa, and pizza.   Fried and fatty foods.   Avoid lying down for the 3 hours prior to your bedtime or prior to taking a nap.   Eat small, frequent meals instead of large meals.   Wear loose-fitting clothing. Do not wear anything tight around  your waist that causes pressure on your stomach.   Raise the head of your bed 6 to 8 inches with wood blocks to help you sleep. Extra pillows will not help.   Only take over-the-counter or prescription medicines for pain, discomfort, or fever as directed by your caregiver.   Do not take aspirin, ibuprofen, or other nonsteroidal anti-inflammatory drugs (NSAIDs).  SEEK IMMEDIATE MEDICAL CARE IF:   You have pain in your arms, neck, jaw, teeth, or back.   Your pain increases or changes in intensity or duration.   You develop nausea, vomiting, or sweating (diaphoresis).   You develop shortness of breath, or you faint.   Your vomit is green, yellow, black, or looks like coffee grounds or blood.   Your stool is red, bloody, or black.  These symptoms could be signs of other problems, such as heart disease, gastric bleeding, or esophageal bleeding. MAKE SURE YOU:   Understand these instructions.   Will watch your condition.   Will get help right away if you are not doing well or get worse.  Document Released: 01/30/2005 Document Revised: 04/11/2011 Document Reviewed: 11/09/2010 [facility] Patient Information 2012 [place], [place].

## 2011-11-12 NOTE — Progress Notes (Signed)
Routed to [facility]

## 2011-11-12 NOTE — Assessment & Plan Note (Addendum)
Chronic GERD.  With history of benign EGDs.  Discussed lifestyle modifications. Referral to dietitian for weight loss Low fat/low for cholesterol diet Decrease omeprazole to 40 mg twice a day. Explained that anything above this was not appropriate for PPIs to treat acid reflux. He may have an element of non-acid reflux. Be sure to walk every day, start slowly around 15 minutes and worked her way up to an hour

## 2011-11-12 NOTE — Telephone Encounter (Signed)
Referral faxed to Jenifer @ [facility] for Outpatient Nutritional Care and she will contact the patient with date and time and patient is aware

## 2011-11-12 NOTE — Assessment & Plan Note (Signed)
Multiple abdominal wall hernias may be contributing towards his upper abdominal pain. He has appointment with Dr. Lovell Sheehan this week.

## 2011-11-12 NOTE — Progress Notes (Signed)
Primary Care Physician:  No primary provider on file. Primary Gastroenterologist:  Dr. Jonette Eva  Chief Complaint  Patient presents with  . Abdominal Pain  . Emesis    HPI:  [name] is a 31 y.o. male here for follow up for acute on chronic upper abdominal pain, GERD, and nausea.  He was seen in the emergency department approximately 3 weeks ago. He has been simply evaluated by Dr. Darrick Penna since 2009. Workup has included a bravo study which showed adequate acid suppression on omeprazole 40 mg twice a day, 3 EGDs, gastric emptying study, and recent CT scan abdomen and pelvis. He continues to gain a significant amount of weight.  His appetite is good. Denies any dysphagia or odynophagia.  He has an appt w/ Dr Lovell Sheehan for multiple hernias seen on CT (Thurs 11am).  C/o chest pain & bilateral upper abdominal pain for the past 6 weeks.  Pain 7/10 at worst, constant.  He has chronic nausea, but rarely vomits.   He was advised to take the omeprazole 40mg  BID, however he reports taking it up to 6 times a day.  He is taking ibuprofen 200 mg 4-5 times per day for pain.  Denies any lower GI symptoms including constipation, diarrhea, rectal bleeding, melena or weight loss.    He tells me he was given "pain meds" from the ER and they did seem to help, however he does not want to get addicted or constipated from them.  CT abdomen/pelvis with IV and oral contrast:  At least 4 small anterior abdominal wall hernias in the midline, extending over an approximate 5 cm length from the umbilicus superiorly. Two of the hernias contain edematous/inflamed  fat, and there is similar inflamed fat in the anterior omentum 2. No evidence of bowel within any of the hernias.  3. No acute abnormalities otherwise involving the abdomen pelvis.  4. 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  Past Medical History  Diagnosis Date  . GERD (gastroesophageal reflux disease)     Bravo study Nov 2009 on Prilosec 40 BID with adequate  acid suppression  . Hypertension   . Anxiety   . Depression     increased over past several months  . History of MRSA infection     pt denies  . Chronic abdominal pain     HIDA scan Sept 2010, EF 93%, s/p chole oct 2010; evaluated at [facility] for abdominal wall pain; Imipramine added May 2011  . Chronic vomiting     normal GES 2009  . S/P endoscopy     2008 Dr. Karilyn Cota: erosive reflux esophagitis, antral gastritis, H.pylori serologies neg, Nov 2009 Dr. Darrick Penna: gastritis, benign esophageal polyp, no H.pylori,  Feb 2011: [facility], Dr. Bubba Hales: normal esophagus, normal stomach, normal duodenum, path unremarkable  . Ventral hernia     Past Surgical History  Procedure Date  . Ventral hernia repair March 2012    Dr. Lovell Sheehan  . Knee surgery 2009  . Cholecystectomy 10/10  . Appendectomy 5/10  . Esophagogastroduodenoscopy 05/2006    H.pylori neg  . Inguinal hernia repair     child  . Esophagogastroduodenoscopy 03/23/2008    Fields-gastritis benign esophageal polyp, otherwise normal. Negative H. pylori he (propofol)    Current Outpatient Prescriptions  Medication Sig Dispense Refill  . albuterol (PROVENTIL HFA;VENTOLIN HFA) 108 (90 BASE) MCG/ACT inhaler Inhale 2 puffs into the lungs every 6 (six) hours as needed. Shortness of breath       . ALPRAZolam (XANAX) 1 MG  tablet Take 1 mg by mouth 4 (four) times daily.        Marland Kitchen lisinopril-hydrochlorothiazide (PRINZIDE,ZESTORETIC) 20-25 MG per tablet Take 1 tablet by mouth daily.        Marland Kitchen omeprazole (PRILOSEC) 40 MG capsule Take 40 mg by mouth 6 (six) times daily. 1 PO 30 MINUTES PRIOR TO MEALS BID      . pravastatin (PRAVACHOL) 40 MG tablet Take 40 mg by mouth daily.        . promethazine (PHENERGAN) 25 MG tablet Take 25 mg by mouth every 6 (six) hours as needed.      . topiramate (TOPAMAX) 100 MG tablet Take 100 mg by mouth 2 (two) times daily.        . promethazine (PHENERGAN) 25 MG tablet Take 1 tablet (25 mg total) by mouth every 6 (six) hours  as needed for nausea.  8 tablet  0  . promethazine (PHENERGAN) 25 MG tablet Take 1 tablet (25 mg total) by mouth every 6 (six) hours as needed for nausea.  12 tablet  0    Allergies as of 11/12/2011 - Review Complete 11/12/2011  Allergen Reaction Noted  . Propoxyphene-acetaminophen Other (See Comments)   Review of Systems: Gen: Denies any fever, chills, sweats, anorexia, fatigue, weakness, malaise, weight loss, and sleep disorder. CV: Denies chest pain, angina, palpitations, syncope, orthopnea, PND, peripheral edema, and claudication. Resp: Denies dyspnea at rest, dyspnea with exercise, cough, sputum, wheezing, coughing up blood, and pleurisy. GI: Denies vomiting blood, jaundice, and fecal incontinence. Derm: Denies rash, itching, dry skin, hives, moles, warts, or unhealing ulcers.  Psych: Denies depression, anxiety, memory loss, suicidal ideation, hallucinations, paranoia, and confusion. Heme: Denies bruising, bleeding, and enlarged lymph nodes.  Physical Exam: BP 120/77  Pulse 96  Temp 98 F (36.7 C) (Temporal)  Ht 5\' 9"  (1.753 m)  Wt 297 lb 3.2 oz (134.809 kg)  BMI 43.89 kg/m2 General:   Alert,  Well-developed, obese, pleasant and cooperative in NAD Eyes:  Sclera clear, no icterus.   Conjunctiva pink. Mouth:  No deformity or lesions, oropharynx pink and moist. Neck:  Supple; no masses or thyromegaly. Heart:  Regular rate and rhythm; no murmurs, clicks, rubs,  or gallops. Abdomen:  Normal bowel sounds.  No bruits.  Multiple striae, tender upper abd along midline, appreciable abdominal wall deficits, well healed scar midline just above umbilicus.  Soft, without masses, hepatosplenomegaly.  Exam limited given body habitus. No guarding or rebound tenderness.   Rectal:  Deferred.  Msk:  Symmetrical without gross deformities.  Pulses:  Normal pulses noted. Extremities:  No clubbing or edema. Neurologic:  Alert and oriented x4;  grossly normal neurologically. Skin:  Intact without  significant lesions or rashes.

## 2011-11-13 ENCOUNTER — Telehealth (HOSPITAL_COMMUNITY): Payer: Self-pay | Admitting: Dietician

## 2011-11-13 NOTE — Telephone Encounter (Signed)
Appointment scheduled for 12/02/11 at 2:00 PM. 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

## 2011-11-13 NOTE — Telephone Encounter (Signed)
Received referral via fax from Dr. Darrick Penna [facility]) for dx: obesity.

## 2011-11-18 NOTE — Progress Notes (Signed)
No PCP on file 

## 2011-11-21 ENCOUNTER — Telehealth (HOSPITAL_COMMUNITY): Payer: Self-pay | Admitting: Dietician

## 2011-11-21 NOTE — Telephone Encounter (Signed)
Mailed appointment confirmation letter and instructions for appointment scheduled 12/02/11 at 2:00 PM via [place] [employer].

## 2011-12-01 ENCOUNTER — Emergency Department (HOSPITAL_COMMUNITY): Payer: Medicaid Other

## 2011-12-01 ENCOUNTER — Emergency Department (HOSPITAL_COMMUNITY)
Admission: EM | Admit: 2011-12-01 | Discharge: 2011-12-01 | Disposition: A | Discharge status: Home / Self Care | Payer: Medicaid Other | Attending: Emergency Medicine | Admitting: Emergency Medicine

## 2011-12-01 ENCOUNTER — Encounter (HOSPITAL_COMMUNITY): Payer: Self-pay | Admitting: *Deleted

## 2011-12-01 DIAGNOSIS — G8929 Other chronic pain: Secondary | ICD-10-CM

## 2011-12-01 DIAGNOSIS — I10 Essential (primary) hypertension: Secondary | ICD-10-CM | POA: Insufficient documentation

## 2011-12-01 DIAGNOSIS — K219 Gastro-esophageal reflux disease without esophagitis: Secondary | ICD-10-CM | POA: Insufficient documentation

## 2011-12-01 DIAGNOSIS — F17200 Nicotine dependence, unspecified, uncomplicated: Secondary | ICD-10-CM | POA: Insufficient documentation

## 2011-12-01 DIAGNOSIS — Z8614 Personal history of Methicillin resistant Staphylococcus aureus infection: Secondary | ICD-10-CM | POA: Insufficient documentation

## 2011-12-01 DIAGNOSIS — K469 Unspecified abdominal hernia without obstruction or gangrene: Secondary | ICD-10-CM | POA: Insufficient documentation

## 2011-12-01 DIAGNOSIS — K439 Ventral hernia without obstruction or gangrene: Secondary | ICD-10-CM

## 2011-12-01 MED ORDER — HYDROMORPHONE HCL PF 2 MG/ML IJ SOLN
2.0000 mg | Freq: Once | INTRAMUSCULAR | Status: AC
  Administered 2011-12-01: 2 mg via INTRAMUSCULAR
  Filled 2011-12-01: qty 1

## 2011-12-01 MED ORDER — GI COCKTAIL ~~LOC~~
30.0000 mL | Freq: Once | ORAL | Status: AC
  Administered 2011-12-01: 30 mL via ORAL
  Filled 2011-12-01: qty 30

## 2011-12-01 NOTE — ED Notes (Signed)
Discharge instructions reviewed with pt, questions answered. Pt verbalized understanding.  

## 2011-12-01 NOTE — ED Notes (Signed)
C/o chronic abd pain due to several hernias located in abd area. States that the pain became worse yesterday, has followed up with Dr. Lovell Sheehan and pt states that he is wanting to hold off on surgery until august due to insurance issues.

## 2011-12-01 NOTE — ED Provider Notes (Signed)
History     CSN: [id number]  Arrival date & time 12/01/11  1228   First MD Initiated Contact with Patient 12/01/11 1607      Chief Complaint  Patient presents with  . Abdominal Pain  . Hernia    (Consider location/radiation/quality/duration/timing/severity/associated sxs/prior treatment) HPI Pt with history of chronic abdominal pain had CT done several weeks ago showing several small ventral hernias containing fat but no bowel. He was seen in followup by Dr. Lovell Sheehan and surgery was deferred due to insurance issues. Pt states pain has returned 2 days ago and getting worse. He has not had any vomiting, but has had 'reflux'. Denies any fever, no problems with stool no fever.   Past Medical History  Diagnosis Date  . GERD (gastroesophageal reflux disease)     Bravo study Nov 2009 on Prilosec 40 BID with adequate acid suppression  . Hypertension   . Anxiety   . Depression     increased over past several months  . History of MRSA infection     pt denies  . Chronic abdominal pain     HIDA scan Sept 2010, EF 93%, s/p chole oct 2010; [facility] for abdominal wall pain; Imipramine added May 2011  . Chronic vomiting     normal GES 2009  . S/P endoscopy     2008 Dr. Karilyn Cota: erosive reflux esophagitis, antral gastritis, H.pylori serologies neg, Nov 2009 Dr. Darrick Penna: gastritis, benign esophageal polyp, no H.pylori,  Feb 2011: [facility], Dr. Bubba Hales: normal esophagus, normal stomach, normal duodenum, path unremarkable  . Ventral hernia     Past Surgical History  Procedure Date  . Ventral hernia repair March 2012    Dr. Lovell Sheehan  . Knee surgery 2009  . Cholecystectomy 10/10  . Appendectomy 5/10  . Esophagogastroduodenoscopy 05/2006    H.pylori neg  . Inguinal hernia repair     child  . Esophagogastroduodenoscopy 03/23/2008    Fields-gastritis benign esophageal polyp, otherwise normal. Negative H. pylori he (propofol)    Family History  Problem Relation Age of Onset  .  Colon cancer Neg Hx     History  Substance Use Topics  . Smoking status: Current Everyday Smoker -- 0.5 packs/day  . Smokeless tobacco: Not on file   Comment: trying to quit, down to about 3 cigarettes per day  . Alcohol Use: No      Review of Systems All other systems reviewed and are negative except as noted in HPI.    Allergies  Propoxyphene-acetaminophen  Home Medications   Current Outpatient Rx  Name Route Sig Dispense Refill  . ALBUTEROL SULFATE HFA 108 (90 BASE) MCG/ACT IN AERS Inhalation Inhale 2 puffs into the lungs every 6 (six) hours as needed. Shortness of breath     . ALPRAZOLAM 1 MG PO TABS Oral Take 1 mg by mouth 4 (four) times daily.      Marland Kitchen LISINOPRIL-HYDROCHLOROTHIAZIDE 20-25 MG PO TABS Oral Take 1 tablet by mouth daily.      Marland Kitchen OMEPRAZOLE 40 MG PO CPDR Oral Take 40 mg by mouth 6 (six) times daily. 1 PO 30 MINUTES PRIOR TO MEALS BID    . OXYCODONE-ACETAMINOPHEN 7.5-325 MG PO TABS Oral Take 1 tablet by mouth every 4 (four) hours as needed. pain    . PRAVASTATIN SODIUM 40 MG PO TABS Oral Take 40 mg by mouth daily.      Marland Kitchen PROMETHAZINE HCL 25 MG PO TABS Oral Take 25 mg by mouth every 6 (six) hours as  needed.    . TOPIRAMATE 100 MG PO TABS Oral Take 100 mg by mouth 2 (two) times daily.      Marland Kitchen PROMETHAZINE HCL 25 MG PO TABS Oral Take 1 tablet (25 mg total) by mouth every 6 (six) hours as needed for nausea. 8 tablet 0  . PROMETHAZINE HCL 25 MG PO TABS Oral Take 1 tablet (25 mg total) by mouth every 6 (six) hours as needed for nausea. 12 tablet 0    BP 124/64  Pulse 102  Temp 98.5 F (36.9 C)  Resp 20  Ht 5\' 9"  (1.753 m)  Wt 290 lb (131.543 kg)  BMI 42.83 kg/m2  SpO2 97%  Physical Exam  Nursing note and vitals reviewed. Constitutional: He is oriented to person, place, and time. He appears well-developed and well-nourished.  HENT:  Head: Normocephalic and atraumatic.  Eyes: EOM are normal. Pupils are equal, round, and reactive to light.  Neck: Normal range  of motion. Neck supple.  Cardiovascular: Normal rate, normal heart sounds and intact distal pulses.   Pulmonary/Chest: Effort normal and breath sounds normal.  Abdominal: Bowel sounds are normal. He exhibits no distension. There is tenderness (diffuse tenderness, small hernias appreciated but no incarcerated bowel). There is no rebound and no guarding.  Musculoskeletal: Normal range of motion. He exhibits no edema and no tenderness.  Neurological: He is alert and oriented to person, place, and time. He has normal strength. No cranial nerve deficit or sensory deficit.  Skin: Skin is warm and dry. No rash noted.  Psychiatric: He has a normal mood and affect.    ED Course  Procedures (including critical care time)  Labs Reviewed - No data to display Dg Abd Acute W/chest  12/01/2011  *RADIOLOGY REPORT*  Clinical Data: Abdominal pain rule out obstruction  ACUTE ABDOMEN SERIES (ABDOMEN 2 VIEW & CHEST 1 VIEW)  Comparison: 10/22/2011  Findings: Cardiomediastinal silhouette is unremarkable.  There is hazy atelectasis or early infiltrate left base medially.  No pulmonary edema. There is a short segment gaseous distended bowel loop in the right abdomen without air fluid level.  Focal ileus or early obstruction cannot be excluded.  Post cholecystectomy surgical clips are noted.  No free abdominal air.  Some stool noted in proximal colon.  IMPRESSION:  1.  Hazy atelectasis or early infiltrate left base medially. 2.  There is a short segment gaseous distended bowel loop in the right abdomen without air fluid level.  Focal ileus or early obstruction cannot be excluded.  Original Report Authenticated By: Natasha Mead, M.D.     No diagnosis found.    MDM  Doubt incarcerated bowel given small size of hernias on CT. Will give pain medication, send for AAS. Pt requesting surgery today, advised that barring obstruction he would need to discuss surgery with Dr. Lovell Sheehan as an outpatient.   5:15 PM Pt has had no  vomiting in the ED. Reviewed AAS images. Pt has had >5 CT scans in the last 3 years. Discussed with Dr. Lovell Sheehan who is familiar with the patient. Agrees with plan to hold on CT today, no concern for bowel obstruction. Pt also has chronic pain. Will give another dose of IM pain medication here and anticipate discharge with followup in Dr. Lovell Sheehan' office later this week if symptoms persist.      Leonette Most B. Bernette Mayers, MD 12/01/11 [phone]

## 2011-12-02 ENCOUNTER — Telehealth (HOSPITAL_COMMUNITY): Payer: Self-pay | Admitting: Dietician

## 2011-12-02 NOTE — Telephone Encounter (Signed)
Mailed appointment confirmation letter and instructions for appointment scheduled 12/09/11 at 2:00 PM via [place] [employer].

## 2011-12-02 NOTE — Telephone Encounter (Signed)
Pt unable to make appointment due to illness. Appointment rescheduled for 12/09/11 at 2:00 PM.

## 2011-12-09 ENCOUNTER — Telehealth (HOSPITAL_COMMUNITY): Payer: Self-pay | Admitting: Dietician

## 2011-12-09 NOTE — Telephone Encounter (Signed)
Pt was a no-show for appointment scheduled for 12/09/2011 at 2:00 PM. Sent letter to pt home notifying pt of no-show and requesting rescheduling appointment.

## 2011-12-25 NOTE — Progress Notes (Signed)
PT LAST SEEN AS OP BY [facility] IN 2011.   WORK UP FOR CHRONIC ABD PAIN/VOMITING   **EGD January 2008, Dr. Karilyn Cota, erosive reflux esophagitis erosive antral gastritis, H. pylori serologies neg   **EGD, November 2009, Dr. Cira Servant, gastritis. Benign esophageal polyp. No evidence of H. pylori.   **Bravo pH study in November 2009, on Prilosec 40 mg b.i.d., showed adequate acid suppression   **DEC 2009: nl GES   **May 2010 while on vacation in [facility] and had an appendectomy.   **SEP 2010 for abd pain- HIDA SCAN SEP 2010 GB EF 93%. Cholecystectomy in OCT 2010.  PATH: chronic cholecystitis, no stones.   **FEB-MAY 2011: 303 LBS, BMI 46 SEEN AND EVALUATED AT [facility] abd wall pain, reflux surgery may follow weight loss, needs impedance prior to surgery. TCS to evaluate diarrhea. PT STATED HE COULD NOT TOLERATE THE MIRALAX PREP.   **APR 2012: PROCEDURE:  Incisional herniorrhaphy with mesh. SURGEON:  Dr. Franky Macho    REVIEWED.

## 2012-01-06 ENCOUNTER — Encounter (HOSPITAL_COMMUNITY): Payer: Self-pay | Admitting: *Deleted

## 2012-01-06 ENCOUNTER — Emergency Department (HOSPITAL_COMMUNITY)
Admission: EM | Admit: 2012-01-06 | Discharge: 2012-01-07 | Disposition: A | Discharge status: Home / Self Care | Payer: Medicaid Other | Source: Home / Self Care | Attending: Emergency Medicine | Admitting: Emergency Medicine

## 2012-01-06 DIAGNOSIS — K439 Ventral hernia without obstruction or gangrene: Secondary | ICD-10-CM | POA: Insufficient documentation

## 2012-01-06 DIAGNOSIS — R1084 Generalized abdominal pain: Secondary | ICD-10-CM | POA: Insufficient documentation

## 2012-01-06 DIAGNOSIS — R109 Unspecified abdominal pain: Secondary | ICD-10-CM

## 2012-01-06 DIAGNOSIS — I10 Essential (primary) hypertension: Secondary | ICD-10-CM | POA: Insufficient documentation

## 2012-01-06 DIAGNOSIS — R112 Nausea with vomiting, unspecified: Secondary | ICD-10-CM | POA: Insufficient documentation

## 2012-01-06 DIAGNOSIS — F17200 Nicotine dependence, unspecified, uncomplicated: Secondary | ICD-10-CM | POA: Insufficient documentation

## 2012-01-06 DIAGNOSIS — F411 Generalized anxiety disorder: Secondary | ICD-10-CM | POA: Insufficient documentation

## 2012-01-06 DIAGNOSIS — K219 Gastro-esophageal reflux disease without esophagitis: Secondary | ICD-10-CM | POA: Insufficient documentation

## 2012-01-06 DIAGNOSIS — G8929 Other chronic pain: Secondary | ICD-10-CM | POA: Insufficient documentation

## 2012-01-06 DIAGNOSIS — F329 Major depressive disorder, single episode, unspecified: Secondary | ICD-10-CM | POA: Insufficient documentation

## 2012-01-06 DIAGNOSIS — F3289 Other specified depressive episodes: Secondary | ICD-10-CM | POA: Insufficient documentation

## 2012-01-06 DIAGNOSIS — F341 Dysthymic disorder: Secondary | ICD-10-CM | POA: Insufficient documentation

## 2012-01-06 NOTE — ED Notes (Addendum)
Generalized abd pain , vomiting, Hx of hernias.  No diarrhea. Pt says he had onset of pain after picking up his 70 lb daughter.

## 2012-01-06 NOTE — ED Provider Notes (Signed)
History   This chart was scribed for [employer]. Colon Branch, MD by Charolett Bumpers . The patient was seen in room APA17/APA17. Patient's care was started at 2313.    CSN: [id number]  Arrival date & time 01/06/12  2223   First MD Initiated Contact with Patient 01/06/12 2313      Chief Complaint  Patient presents with  . Abdominal Pain    (Consider location/radiation/quality/duration/timing/severity/associated sxs/prior treatment) HPI [name] is a 31 y.o. male who has a h/o GERD and a ventral hernia presents to the Emergency Department complaining of constant, moderate generalized abdominal pain that increased yesterday after picking up his 70 lb daughter. Pt reports that he felt something "pop" in his abdomen and has nausea, reflux and vomiting since. Pt reports that he is taking 3-4 tablets daily of omeprazole. Pt denies any diarrhea.   GI: Dr. Darrick Penna Past Medical History  Diagnosis Date  . GERD (gastroesophageal reflux disease)     Bravo study Nov 2009 on Prilosec 40 BID with adequate acid suppression  . Hypertension   . Anxiety   . Depression     increased over past several months  . History of MRSA infection     pt denies  . Chronic abdominal pain     HIDA scan Sept 2010, EF 93%, s/p chole oct 2010; evaluated at [facility] for abdominal wall pain; Imipramine added May 2011  . Chronic vomiting     normal GES 2009  . S/P endoscopy     2008 Dr. Karilyn Cota: erosive reflux esophagitis, antral gastritis, H.pylori serologies neg, Nov 2009 Dr. Darrick Penna: gastritis, benign esophageal polyp, no H.pylori,  Feb 2011: [facility], Dr. Bubba Hales: normal esophagus, normal stomach, normal duodenum, path unremarkable  . Ventral hernia     Past Surgical History  Procedure Date  . Ventral hernia repair March 2012    Dr. Lovell Sheehan  . Knee surgery 2009  . Cholecystectomy 10/10  . Appendectomy 5/10  . Esophagogastroduodenoscopy 05/2006    H.pylori neg  . Inguinal hernia repair     child  .  Esophagogastroduodenoscopy 03/23/2008    Fields-gastritis benign esophageal polyp, otherwise normal. Negative H. pylori he (propofol)    Family History  Problem Relation Age of Onset  . Colon cancer Neg Hx     History  Substance Use Topics  . Smoking status: Current Everyday Smoker -- 0.5 packs/day  . Smokeless tobacco: Not on file   Comment: trying to quit, down to about 3 cigarettes per day  . Alcohol Use: No      Review of Systems A complete 10 system review of systems was obtained and all systems are negative except as noted in the HPI and PMH.   Allergies  Propoxyphene-acetaminophen  Home Medications   Current Outpatient Rx  Name Route Sig Dispense Refill  . ALPRAZOLAM 1 MG PO TABS Oral Take 1 mg by mouth 4 (four) times daily.      Marland Kitchen LISINOPRIL-HYDROCHLOROTHIAZIDE 20-25 MG PO TABS Oral Take 1 tablet by mouth daily.      Marland Kitchen OMEPRAZOLE 40 MG PO CPDR Oral Take 40-80 mg by mouth 4 (four) times daily. 1 PO 30 MINUTES PRIOR TO MEALS BID    . OXYCODONE-ACETAMINOPHEN 7.5-325 MG PO TABS Oral Take 1 tablet by mouth every 4 (four) hours as needed. pain    . PROMETHAZINE HCL 25 MG PO TABS Oral Take 25 mg by mouth every 6 (six) hours as needed. For nausea/vomiting    . ALBUTEROL SULFATE HFA  108 (90 BASE) MCG/ACT IN AERS Inhalation Inhale 2 puffs into the lungs every 6 (six) hours as needed. Shortness of breath     . TOPIRAMATE 100 MG PO TABS Oral Take 100 mg by mouth 2 (two) times daily.        BP 153/94  Pulse 113  Temp 97.9 F (36.6 C) (Oral)  Resp 20  Ht 5\' 9"  (1.753 m)  Wt 292 lb 4 oz (132.564 kg)  BMI 43.16 kg/m2  SpO2 96%  Physical Exam  Nursing note and vitals reviewed. Constitutional: He is oriented to person, place, and time. He appears well-developed and well-nourished. No distress.  HENT:  Head: Normocephalic and atraumatic.  Right Ear: External ear normal.  Left Ear: External ear normal.  Nose: Nose normal.  Eyes: EOM are normal. Pupils are equal, round, and  reactive to light.  Neck: Normal range of motion. Neck supple. No tracheal deviation present.  Cardiovascular: Normal rate, regular rhythm and normal heart sounds.   Pulmonary/Chest: Effort normal and breath sounds normal. No respiratory distress. He has no wheezes.  Abdominal: Soft. Bowel sounds are normal. He exhibits no distension. There is tenderness.       Reducible ventral hernia that is tender to touch.   Musculoskeletal: Normal range of motion. He exhibits no edema.  Neurological: He is alert and oriented to person, place, and time. No sensory deficit.  Skin: Skin is warm and dry.  Psychiatric: He has a normal mood and affect. His behavior is normal.    ED Course  Procedures (including critical care time)  DIAGNOSTIC STUDIES: Oxygen Saturation is 96% on room air, adequate by my interpretation.    COORDINATION OF CARE:  23:45-Discussed planned course of treatment with the patient, who is agreeable at this time.  Dg Abd Acute W/chest  01/07/2012  *RADIOLOGY REPORT*  Clinical Data:  Abdominal pain, history of hernia  ACUTE ABDOMEN SERIES (ABDOMEN 2 VIEW & CHEST 1 VIEW)  Comparison: 12/01/2011  Findings: Normal heart size, mediastinal contours, and pulmonary vascularity. Lungs clear. Previously identified left basilar infiltrate resolved. Surgical clips right upper quadrant question cholecystectomy. Nonobstructive bowel gas pattern. No bowel dilatation, bowel wall thickening, or free intraperitoneal air. Scattered pelvic phleboliths. No definite urinary tract calcification.  IMPRESSION: No acute abnormalities.   Original Report Authenticated By: Lollie Marrow, M.D.      No diagnosis found.    MDM  Patient with abdominal pain since lifting his daughter. H/o abdominal hernia. No increase in hernia size which is easily reducible. Xrays without acute findings. Given analgesic with improvement.Pt stable in ED with no significant deterioration in condition.The patient appears reasonably  screened and/or stabilized for discharge and I doubt any other medical condition or other [facility] requiring further screening, evaluation, or treatment in the ED at this time prior to discharge.  I personally performed the services described in this documentation, which was scribed in my presence. The recorded information has been reviewed and considered.   MDM Reviewed: nursing note and vitals Interpretation: x-ray           Nicoletta Dress. Colon Branch, MD 01/07/12 [phone]

## 2012-01-07 ENCOUNTER — Emergency Department (HOSPITAL_COMMUNITY): Payer: Medicaid Other

## 2012-01-07 ENCOUNTER — Emergency Department (HOSPITAL_COMMUNITY)
Admission: EM | Admit: 2012-01-07 | Discharge: 2012-01-07 | Disposition: A | Discharge status: Home / Self Care | Payer: Medicaid Other | Attending: Emergency Medicine | Admitting: Emergency Medicine

## 2012-01-07 ENCOUNTER — Encounter (HOSPITAL_COMMUNITY): Payer: Self-pay | Admitting: *Deleted

## 2012-01-07 DIAGNOSIS — R1110 Vomiting, unspecified: Secondary | ICD-10-CM

## 2012-01-07 DIAGNOSIS — R109 Unspecified abdominal pain: Secondary | ICD-10-CM

## 2012-01-07 LAB — CBC WITH DIFFERENTIAL/PLATELET
Basophils Relative: 0 % (ref 0–1)
Eosinophils Absolute: 0.1 10*3/uL (ref 0.0–0.7)
Hemoglobin: 16.5 g/dL (ref 13.0–17.0)
MCHC: 34.7 g/dL (ref 30.0–36.0)
Monocytes Relative: 8 % (ref 3–12)
Neutro Abs: 7 10*3/uL (ref 1.7–7.7)
Neutrophils Relative %: 62 % (ref 43–77)
Platelets: 247 10*3/uL (ref 150–400)
RBC: 5.21 MIL/uL (ref 4.22–5.81)

## 2012-01-07 LAB — COMPREHENSIVE METABOLIC PANEL
ALT: 26 U/L (ref 0–53)
AST: 20 U/L (ref 0–37)
Albumin: 5 g/dL (ref 3.5–5.2)
Alkaline Phosphatase: 110 U/L (ref 39–117)
BUN: 18 mg/dL (ref 6–23)
Chloride: 94 mEq/L — ABNORMAL LOW (ref 96–112)
Potassium: 3.8 mEq/L (ref 3.5–5.1)
Sodium: 134 mEq/L — ABNORMAL LOW (ref 135–145)
Total Bilirubin: 2.2 mg/dL — ABNORMAL HIGH (ref 0.3–1.2)
Total Protein: 8 g/dL (ref 6.0–8.3)

## 2012-01-07 LAB — LIPASE, BLOOD: Lipase: 24 U/L (ref 11–59)

## 2012-01-07 MED ORDER — KETOROLAC TROMETHAMINE 30 MG/ML IJ SOLN
30.0000 mg | Freq: Once | INTRAMUSCULAR | Status: AC
  Administered 2012-01-07: 30 mg via INTRAVENOUS
  Filled 2012-01-07: qty 1

## 2012-01-07 MED ORDER — ONDANSETRON HCL 4 MG PO TABS
8.0000 mg | ORAL_TABLET | Freq: Once | ORAL | Status: AC
  Administered 2012-01-07: 8 mg via ORAL
  Filled 2012-01-07: qty 1

## 2012-01-07 MED ORDER — OXYCODONE-ACETAMINOPHEN 5-325 MG PO TABS: 1.0000 | ORAL_TABLET | ORAL | Status: AC | PRN

## 2012-01-07 MED ORDER — OXYCODONE-ACETAMINOPHEN 5-325 MG PO TABS
1.0000 | ORAL_TABLET | Freq: Once | ORAL | Status: AC
  Administered 2012-01-07: 1 via ORAL
  Filled 2012-01-07: qty 1

## 2012-01-07 MED ORDER — SODIUM CHLORIDE 0.9 % IV BOLUS (SEPSIS)
1000.0000 mL | Freq: Once | INTRAVENOUS | Status: AC
  Administered 2012-01-07: 1000 mL via INTRAVENOUS

## 2012-01-07 MED ORDER — OXYCODONE-ACETAMINOPHEN 5-325 MG PO TABS
2.0000 | ORAL_TABLET | Freq: Once | ORAL | Status: AC
  Administered 2012-01-07: 2 via ORAL
  Filled 2012-01-07: qty 2

## 2012-01-07 MED ORDER — ONDANSETRON 4 MG PO TBDP
4.0000 mg | ORAL_TABLET | Freq: Once | ORAL | Status: AC
  Administered 2012-01-07: 4 mg via ORAL
  Filled 2012-01-07: qty 1

## 2012-01-07 MED ORDER — ONDANSETRON HCL 4 MG/2ML IJ SOLN
4.0000 mg | Freq: Once | INTRAMUSCULAR | Status: AC
  Administered 2012-01-07: 4 mg via INTRAVENOUS
  Filled 2012-01-07: qty 2

## 2012-01-07 NOTE — ED Notes (Signed)
Pt began to vomit after receiving meds. Dr. Bebe Shaggy notified.

## 2012-01-07 NOTE — ED Notes (Signed)
Pt c/o mid abdominal pain since Sunday. Pt also c/o NV but denies diarrhea.

## 2012-01-07 NOTE — ED Notes (Addendum)
Pt c/o abd pain that was worse after picking up his daughter, has hx of abd hernia, pt was seen in er during the night for the same thing and states that he is not any better. Pt states that he has not gotten his pain medication filled yet and he thought that he would come back to er instead.

## 2012-01-07 NOTE — ED Notes (Signed)
Notified dr. Colon Branch that patient is requesting something for nausea and pain, stated to give 4 mg odt zofran.

## 2012-01-07 NOTE — ED Notes (Signed)
Patient requesting pain and nausea medication.  

## 2012-01-07 NOTE — ED Notes (Signed)
Patient states he feels as if he didn't get treated fairly. Charge nurse, Steward Drone spoke with patient.

## 2012-01-07 NOTE — ED Provider Notes (Signed)
History  This chart was scribed for Joya Gaskins, MD by Bennett Scrape. This patient was seen in room APA19/APA19 and the patient's care was started at 5:02PM.  CSN: [id number]  Arrival date & time 01/07/12  1254   First MD Initiated Contact with Patient 01/07/12 1702      Chief Complaint  Patient presents with  . Abdominal Pain     Patient is a 31 y.o. male presenting with abdominal pain. The history is provided by the patient. No language interpreter was used.  Abdominal Pain The primary symptoms of the illness include abdominal pain, nausea and vomiting. The primary symptoms of the illness do not include fever or diarrhea. The current episode started 2 days ago. The onset of the illness was sudden. The problem has not changed since onset. The abdominal pain is generalized. The abdominal pain does not radiate.  Symptoms associated with the illness do not include chills or back pain. Significant associated medical issues do not include inflammatory bowel disease.    KAYLIB FURNESS is a 31 y.o. male with a h/o ventral hernia who presents to the Emergency Department complaining of 2 days of sudden onset abdominal pain located in the described as soreness with associated nausea and emesis after picking up his 70 lb daughter. He reports that he felt like the hernia shifted when he picked his daughter up. He was seen here last night and discharged home. He states that he gets hernias often with heavy lifting and excessive straining and takes stool softners daily. He denies fever, sore throat, visual disturbance, CP, SOB, diarrhea, urinary symptoms, back pain, HA, weakness, numbness and rash as associated symptoms.  He has a h/o GERD, anxiety, HTN, and depression. He is a current everyday smoker but denies alcohol use. He reports that he has an appointment with Dr. Lovell Sheehan in 2 days.   Past Medical History  Diagnosis Date  . GERD (gastroesophageal reflux disease)     Bravo study Nov 2009  on Prilosec 40 BID with adequate acid suppression  . Hypertension   . Anxiety   . Depression     increased over past several months  . History of MRSA infection     pt denies  . Chronic abdominal pain     HIDA scan Sept 2010, EF 93%, s/p chole oct 2010; evaluated at [facility] for abdominal wall pain; Imipramine added May 2011  . Chronic vomiting     normal GES 2009  . S/P endoscopy     2008 Dr. Karilyn Cota: erosive reflux esophagitis, antral gastritis, H.pylori serologies neg, Nov 2009 Dr. Darrick Penna: gastritis, benign esophageal polyp, no H.pylori,  Feb 2011: [facility], Dr. Bubba Hales: normal esophagus, normal stomach, normal duodenum, path unremarkable  . Ventral hernia     Past Surgical History  Procedure Date  . Ventral hernia repair March 2012    Dr. Lovell Sheehan  . Knee surgery 2009  . Cholecystectomy- Dr. Leticia Penna  10/10  . Appendectomy 5/10  . Esophagogastroduodenoscopy 05/2006    H.pylori neg  . Inguinal hernia repair     child  . Esophagogastroduodenoscopy 03/23/2008    Fields-gastritis benign esophageal polyp, otherwise normal. Negative H. pylori he (propofol)    Family History  Problem Relation Age of Onset  . Colon cancer Neg Hx     History  Substance Use Topics  . Smoking status: Current Everyday Smoker -- 0.5 packs/day  . Smokeless tobacco: Not on file   Comment: trying to quit, down to about 3 cigarettes per  day  . Alcohol Use: No      Review of Systems  Constitutional: Negative for fever and chills.  Cardiovascular: Negative for chest pain.  Gastrointestinal: Positive for nausea, vomiting and abdominal pain. Negative for diarrhea.  Musculoskeletal: Negative for back pain.  All other systems reviewed and are negative.    Allergies  Propoxyphene-acetaminophen  Home Medications   Current Outpatient Rx  Name Route Sig Dispense Refill  . ALBUTEROL SULFATE HFA 108 (90 BASE) MCG/ACT IN AERS Inhalation Inhale 2 puffs into the lungs every 6 (six) hours as needed.  Shortness of breath     . ALPRAZOLAM 1 MG PO TABS Oral Take 1 mg by mouth 4 (four) times daily.      Marland Kitchen LISINOPRIL-HYDROCHLOROTHIAZIDE 20-25 MG PO TABS Oral Take 1 tablet by mouth daily.      Marland Kitchen OMEPRAZOLE 40 MG PO CPDR Oral Take 40-80 mg by mouth 4 (four) times daily. 1 PO 30 MINUTES PRIOR TO MEALS BID    . OXYCODONE-ACETAMINOPHEN 7.5-325 MG PO TABS Oral Take 1 tablet by mouth every 4 (four) hours as needed. pain    . OXYCODONE-ACETAMINOPHEN 5-325 MG PO TABS Oral Take 1 tablet by mouth every 4 (four) hours as needed for pain. 10 tablet 0  . PROMETHAZINE HCL 25 MG PO TABS Oral Take 25 mg by mouth every 6 (six) hours as needed. For nausea/vomiting    . TOPIRAMATE 100 MG PO TABS Oral Take 100 mg by mouth 2 (two) times daily.        Triage Vitals: BP 126/86  Pulse 106  Temp 98.4 F (36.9 C) (Oral)  Resp 20  Ht 5\' 9"  (1.753 m)  Wt 290 lb (131.543 kg)  BMI 42.83 kg/m2  SpO2 98%  Physical Exam  Nursing note and vitals reviewed.  CONSTITUTIONAL: Well developed/well nourished HEAD AND FACE: Normocephalic/atraumatic EYES: EOMI/PERRL ENMT: Mucous membranes moist NECK: supple no meningeal signs SPINE:entire spine nontender CV: S1/S2 noted, no murmurs/rubs/gallops noted LUNGS: Lungs are clear to auscultation bilaterally, no apparent distress ABDOMEN: soft, nontender, no rebound or guarding, well-healed scars noted, bowel sounds noted, easily reducible ventral hernia, no erythema, no significant tenderness GU:no cva tenderness, no inguinal hernia, no testicular tenderness, chaperone present NEURO: Pt is awake/alert, moves all extremitiesx4 EXTREMITIES: pulses normal, full ROM SKIN: warm, color normal PSYCH: no abnormalities of mood noted  ED Course  Procedures  DIAGNOSTIC STUDIES: Oxygen Saturation is 98% on room air, normal by my interpretation.    COORDINATION OF CARE: 5:10PM-Informed pt that the abdominal x-ray taken last night appeared normal. Discussed treatment plan with pt at  bedside and pt agreed to plan.   6:26PM-Pt rechecked and is still having nausea and vomiting. Abdominal exam is benign. Will treat with antiemetics. Discussed adding blood work to the treatment plan and pt agreed.   8:05 PM Pt well appearing, watching TV, no distress, abdomen soft.  He is nontoxic appearing He did have mild elevation for bilirubin, advised to f/u with GI next week for recheck.  He has had this before I doubt acute abdominal process at this time.  No signs of incarcerated hernia   Labs Reviewed - No data to display Dg Abd Acute W/chest  01/07/2012  *RADIOLOGY REPORT*  Clinical Data:  Abdominal pain, history of hernia  ACUTE ABDOMEN SERIES (ABDOMEN 2 VIEW & CHEST 1 VIEW)  Comparison: 12/01/2011  Findings: Normal heart size, mediastinal contours, and pulmonary vascularity. Lungs clear. Previously identified left basilar infiltrate resolved. Surgical clips right upper quadrant  question cholecystectomy. Nonobstructive bowel gas pattern. No bowel dilatation, bowel wall thickening, or free intraperitoneal air. Scattered pelvic phleboliths. No definite urinary tract calcification.  IMPRESSION: No acute abnormalities.   Original Report Authenticated By: Lollie Marrow, M.D.        MDM  Nursing notes including past medical history and social history reviewed and considered in documentation xrays reviewed and considered Labs/vital reviewed and considered Previous records reviewed and considered - recent ED visit, xray reviewed         Joya Gaskins, MD 01/07/12 2006

## 2012-01-09 ENCOUNTER — Ambulatory Visit (INDEPENDENT_AMBULATORY_CARE_PROVIDER_SITE_OTHER): Payer: Self-pay | Admitting: Urgent Care

## 2012-01-09 ENCOUNTER — Encounter: Payer: Self-pay | Admitting: Urgent Care

## 2012-01-09 ENCOUNTER — Telehealth: Payer: Self-pay | Admitting: Urgent Care

## 2012-01-09 VITALS — BP 143/88 | HR 84 | Temp 96.9°F | Ht 69.0 in | Wt 298.6 lb

## 2012-01-09 VITALS — BP 129/78 | HR 85 | Temp 98.2°F | Ht 69.0 in | Wt 298.6 lb

## 2012-01-09 DIAGNOSIS — R109 Unspecified abdominal pain: Secondary | ICD-10-CM

## 2012-01-09 DIAGNOSIS — R1013 Epigastric pain: Secondary | ICD-10-CM

## 2012-01-09 DIAGNOSIS — K219 Gastro-esophageal reflux disease without esophagitis: Secondary | ICD-10-CM

## 2012-01-09 DIAGNOSIS — R17 Unspecified jaundice: Secondary | ICD-10-CM

## 2012-01-09 DIAGNOSIS — K469 Unspecified abdominal hernia without obstruction or gangrene: Secondary | ICD-10-CM

## 2012-01-09 NOTE — Patient Instructions (Addendum)
Recommend 1-2# weight loss per week until ideal body weight through exercise & diet. Low fat/cholesterol diet. Gradually increase exercise from 15 min daily up to 1 hr per day 5 days/week. Avoid alcohol Go get your labs Follow up with Dr Lovell Sheehan about your hernias Consult w/ dietician Omeprazole 40mg  before breakfast & dinner. NO MORE  Diet for GERD or PUD Nutrition therapy can help ease the discomfort of gastroesophageal reflux disease (GERD) and peptic ulcer disease (PUD).  HOME CARE INSTRUCTIONS   Eat your meals slowly, in a relaxed setting.   Eat 5 to 6 small meals per day.   If a food causes distress, stop eating it for a period of time.  FOODS TO AVOID  Coffee, regular or decaffeinated.   Cola beverages, regular or low calorie.   Tea, regular or decaffeinated.   Pepper.   Cocoa.   High fat foods, including meats.   Butter, margarine, hydrogenated oil (trans fats).   Peppermint or spearmint (if you have GERD).   Fruits and vegetables if not tolerated.   Alcohol.   Nicotine (smoking or chewing). This is one of the most potent stimulants to acid production in the gastrointestinal tract.   Any food that seems to aggravate your condition.  If you have questions regarding your diet, ask your caregiver or a registered dietitian. TIPS  Lying flat may make symptoms worse. Keep the head of your bed raised 6 to 9 inches (15 to 23 cm) by using a foam wedge or blocks under the legs of the bed.   Do not lay down until 3 hours after eating a meal.   Daily physical activity may help reduce symptoms.  MAKE SURE YOU:   Understand these instructions.   Will watch your condition.   Will get help right away if you are not doing well or get worse.  Document Released: 04/22/2005 Document Revised: 04/11/2011 Document Reviewed: 03/08/2011 [facility] Patient Information 2012 [place], [place].

## 2012-01-09 NOTE — Telephone Encounter (Signed)
Referral has been made to [employer] at [facility] and she will call and give patient the date and time

## 2012-01-09 NOTE — Progress Notes (Signed)
While trying to discharge pt he had multiple questions and concerns about reducing his omeprazole 40mg . Pt has been taking omeprazole 40mg  up to 6x a day. KJ advised pt should not take this medication no more than twice a day, but he can add a zantac 150mg  if needed. Pt stated he thinks he may have tried it before but he will try it again. We were going to give pt samples of dexilant but he said that it caused him to pass out last year, he did not have this on his allergy list so I added it in epic. Pt also stated he has tried prevacid, prilosec, nexium and dexilant and they have not helped.  Pt is going to try the omeprazole 40mg  bid and zantac 150mg  prn and will call us if that is not working.

## 2012-01-09 NOTE — Telephone Encounter (Signed)
Thanks

## 2012-01-09 NOTE — Progress Notes (Signed)
Primary Care Physician:  No primary provider on file. Primary Gastroenterologist:  Dr. Jonette Eva  Chief Complaint  Patient presents with  . Gastrophageal Reflux    HPI:  [name] is a 31 y.o. male here for follow up ER visit for acute on chronic upper abdominal pain & bump in LFTS.  He has hx GERD, nausea, multiple hernias & chronic abd pain. He has been extensively evaluated by Dr. Darrick Penna since 2009. Workup has included a bravo study which showed adequate acid suppression on omeprazole 40 mg twice a day, 3 EGDs, gastric emptying study, and recent CT scan abdomen and pelvis. He continues to gain a significant amount of weight despite our encouragement to lose weight prior to anti-reflux surgery.  His appetite is good. Denies any dysphagia or odynophagia.   C/o bilateral upper abdominal pain & generalized abd pain.  Pt saw Dr Lovell Sheehan today for multiple hernias & tells me his hernia repair surgery is on hold for medicaid & approval of disability.  Denies fever or chills.  Symptoms worse 4 days ago, improved for the moment.  Bowels are moving ok.  Denies rectal bleeding or melena.  Heartburn still a problem.  Taking omeprazole 40mg  2 in AM, 1 at lunch & 1 night despite our instructions.  T bili 2.2, unfortunately it was unfractionated.  ADDITIONAL WORK UP FOR CHRONIC ABD PAIN/VOMITING   **EGD January 2008, Dr. Karilyn Cota, erosive reflux esophagitis erosive antral gastritis, H. pylori serologies neg   **EGD, November 2009, Dr. Cira Servant, gastritis. Benign esophageal polyp. No evidence of H. pylori.   **Bravo pH study in November 2009, on Prilosec 40 mg b.i.d., showed adequate acid suppression   **DEC 2009: nl GES   **May 2010 while on vacation in [facility] and had an appendectomy.   **SEP 2010 for abd pain- HIDA SCAN SEP 2010 GB EF 93%. Cholecystectomy in OCT 2010.  PATH: chronic cholecystitis, no stones.   **FEB-MAY 2011: 303 LBS, BMI 46 SEEN AND EVALUATED AT [facility] abd wall pain, reflux surgery may  follow weight loss, needs impedance prior to surgery. TCS to evaluate diarrhea. PT STATED HE COULD NOT TOLERATE THE MIRALAX PREP.   **APR 2012: PROCEDURE:  Incisional herniorrhaphy with mesh. SURGEON:  Dr. Franky Macho  Recent Results (from the past 336 hour(s))  CBC WITH DIFFERENTIAL   Collection Time   01/07/12  6:45 PM      Component Value Range   WBC 11.2 (*) 4.0 - 10.5 K/uL   RBC 5.21  4.22 - 5.81 MIL/uL   Hemoglobin 16.5  13.0 - 17.0 g/dL   HCT 16.1  09.6 - 04.5 %   MCV 91.2  78.0 - 100.0 fL   MCH 31.7  26.0 - 34.0 pg   MCHC 34.7  30.0 - 36.0 g/dL   RDW 40.9  81.1 - 91.4 %   Platelets 247  150 - 400 K/uL   Neutrophils Relative 62  43 - 77 %   Neutro Abs 7.0  1.7 - 7.7 K/uL   Lymphocytes Relative 28  12 - 46 %   Lymphs Abs 3.1  0.7 - 4.0 K/uL   Monocytes Relative 8  3 - 12 %   Monocytes Absolute 0.9  0.1 - 1.0 K/uL   Eosinophils Relative 1  0 - 5 %   Eosinophils Absolute 0.1  0.0 - 0.7 K/uL   Basophils Relative 0  0 - 1 %   Basophils Absolute 0.0  0.0 - 0.1 K/uL  COMPREHENSIVE METABOLIC PANEL  Collection Time   01/07/12  6:45 PM      Component Value Range   Sodium 134 (*) 135 - 145 mEq/L   Potassium 3.8  3.5 - 5.1 mEq/L   Chloride 94 (*) 96 - 112 mEq/L   CO2 26  19 - 32 mEq/L   Glucose, Bld 102 (*) 70 - 99 mg/dL   BUN 18  6 - 23 mg/dL   Creatinine, Ser 1.61  0.50 - 1.35 mg/dL   Calcium 09.6  8.4 - 04.5 mg/dL   Total Protein 8.0  6.0 - 8.3 g/dL   Albumin 5.0  3.5 - 5.2 g/dL   AST 20  0 - 37 U/L   ALT 26  0 - 53 U/L   Alkaline Phosphatase 110  39 - 117 U/L   Total Bilirubin 2.2 (*) 0.3 - 1.2 mg/dL   GFR calc non Af Amer 84 (*) >90 mL/min   GFR calc Af Amer >90  >90 mL/min  LIPASE, BLOOD   Collection Time   01/07/12  6:45 PM      Component Value Range   Lipase 24  11 - 59 U/L  LACTIC ACID, PLASMA   Collection Time   01/07/12  6:45 PM      Component Value Range   Lactic Acid, Venous 1.1  0.5 - 2.2 mmol/L  HEPATIC FUNCTION PANEL   Collection Time   01/09/12  1:40 PM        Component Value Range   Total Bilirubin 1.0  0.3 - 1.2 mg/dL   Bilirubin, Direct 0.2  0.0 - 0.3 mg/dL   Indirect Bilirubin 0.8  0.0 - 0.9 mg/dL   Alkaline Phosphatase 76  39 - 117 U/L   AST 29  0 - 37 U/L   ALT 23  0 - 53 U/L   Total Protein 6.9  6.0 - 8.3 g/dL   Albumin 4.4  3.5 - 5.2 g/dL  ETHANOL   Collection Time   01/09/12  1:40 PM      Component Value Range   Alcohol, Ethyl (B) <10  0 - 10 mg/dL     Past Medical History  Diagnosis Date  . GERD (gastroesophageal reflux disease)     Bravo study Nov 2009 on Prilosec 40 BID with adequate acid suppression  . Hypertension   . Anxiety   . Depression     increased over past several months  . History of MRSA infection     pt denies  . Chronic abdominal pain     HIDA scan Sept 2010, EF 93%, s/p chole oct 2010; evaluated at [facility] for abdominal wall pain; Imipramine added May 2011  . Chronic vomiting     normal GES 2009  . S/P endoscopy     2008 Dr. Karilyn Cota: erosive reflux esophagitis, antral gastritis, H.pylori serologies neg, Nov 2009 Dr. Darrick Penna: gastritis, benign esophageal polyp, no H.pylori,  Feb 2011: [facility], Dr. Bubba Hales: normal esophagus, normal stomach, normal duodenum, path unremarkable  . Ventral hernia     Past Surgical History  Procedure Date  . Ventral hernia repair March 2012    Dr. Lovell Sheehan  . Knee surgery 2009  . Cholecystectomy 10/10  . Appendectomy 5/10  . Esophagogastroduodenoscopy 05/2006    H.pylori neg  . Inguinal hernia repair     child  . Esophagogastroduodenoscopy 03/23/2008    Fields-gastritis benign esophageal polyp, otherwise normal. Negative H. pylori he (propofol)    Current Outpatient Prescriptions  Medication Sig Dispense Refill  .  albuterol (PROVENTIL HFA;VENTOLIN HFA) 108 (90 BASE) MCG/ACT inhaler Inhale 2 puffs into the lungs every 6 (six) hours as needed. Shortness of breath       . ALPRAZolam (XANAX) 1 MG tablet Take 1 mg by mouth 4 (four) times daily.        . carbamazepine  (TEGRETOL XR) 100 MG 12 hr tablet Take 100 mg by mouth 4 (four) times daily.      . citalopram (CELEXA) 40 MG tablet Take 40 mg by mouth daily.      Marland Kitchen lisinopril-hydrochlorothiazide (PRINZIDE,ZESTORETIC) 20-25 MG per tablet Take 1 tablet by mouth daily.        Marland Kitchen omeprazole (PRILOSEC) 40 MG capsule Take 40-80 mg by mouth 2 (two) times daily. 1 PO 30 MINUTES PRIOR TO MEALS BID      . oxyCODONE-acetaminophen (PERCOCET/ROXICET) 5-325 MG per tablet Take 1 tablet by mouth every 4 (four) hours as needed for pain.  10 tablet  0  . promethazine (PHENERGAN) 25 MG tablet Take 25 mg by mouth every 6 (six) hours as needed. For nausea/vomiting      . topiramate (TOPAMAX) 100 MG tablet Take 100 mg by mouth 2 (two) times daily.        . traZODone (DESYREL) 100 MG tablet Take 100 mg by mouth at bedtime.        Allergies as of 01/09/2012 - Review Complete 01/09/2012  Allergen Reaction Noted  . Dexilant (dexlansoprazole)  01/09/2012  . Propoxyphene-acetaminophen Other (See Comments)   Review of Systems: Gen: Denies any fever, chills, sweats, anorexia, fatigue, weakness, malaise, weight loss, and sleep disorder. CV: Denies chest pain, angina, palpitations, syncope, orthopnea, PND, peripheral edema, and claudication. Resp: Denies dyspnea at rest, dyspnea with exercise, cough, sputum, wheezing, coughing up blood, and pleurisy. GI: Denies vomiting blood, jaundice, and fecal incontinence. Derm: Denies rash, itching, dry skin, hives, moles, warts, or unhealing ulcers.  Psych: Denies depression, anxiety, memory loss, suicidal ideation, hallucinations, paranoia, and confusion. Heme: Denies bruising, bleeding, and enlarged lymph nodes.  Physical Exam: BP 129/78  Pulse 85  Temp 98.2 F (36.8 C) (Temporal)  Ht 5\' 9"  (1.753 m)  Wt 298 lb 9.6 oz (135.444 kg)  BMI 44.10 kg/m2 General:   Alert,  Well-developed, obese, pleasant, anxious and cooperative in NAD Eyes:  Sclera clear, no icterus.   Conjunctiva pink. Mouth:   No deformity or lesions, oropharynx pink and moist. Neck:  Supple; no masses or thyromegaly. Heart:  Regular rate and rhythm; no murmurs, clicks, rubs,  or gallops. Abdomen:  Normal bowel sounds.  No bruits.  Multiple striae, tender upper abd along midline, appreciable abdominal wall deficits/hernias, well healed scar midline just above umbilicus.  Soft, without masses, hepatosplenomegaly.  Exam limited given body habitus. No guarding or rebound tenderness.   Rectal:  Deferred.  Msk:  Symmetrical without gross deformities.  Pulses:  Normal pulses noted. Extremities:  No clubbing or edema. Neurologic:  Alert and oriented x4;  grossly normal neurologically. Skin:  Intact without significant lesions or rashes.

## 2012-01-10 LAB — HEPATIC FUNCTION PANEL
ALT: 23 U/L (ref 0–53)
AST: 29 U/L (ref 0–37)
Alkaline Phosphatase: 76 U/L (ref 39–117)
Bilirubin, Direct: 0.2 mg/dL (ref 0.0–0.3)
Indirect Bilirubin: 0.8 mg/dL (ref 0.0–0.9)
Total Protein: 6.9 g/dL (ref 6.0–8.3)

## 2012-01-10 NOTE — Assessment & Plan Note (Addendum)
Omeprazole 40mg  BID.  Zantac 150mg  prn for breakthrough. Consider anti-reflux surgery after weight loss.  Impedence prior to surgery.

## 2012-01-10 NOTE — Assessment & Plan Note (Signed)
?  Sphincter of Oddi.  See epigastric pain

## 2012-01-10 NOTE — Assessment & Plan Note (Addendum)
[name] is a pleasant 31 y.o. male w/ chronic epigastric pain, refractory GERD despite PPI, & now w/ 3 documented episodes of hyperbilirubinemia w/ episodes of pain.  Unfortunately, none were fractionated.  S/p cholecystectomy.  ? Sphincter of Oddi dysfunction, non-acid reflux, transient intermittent hernia incarcerations or functional abdominal pain.  LFTS now.  If normal, 4 hrs after next bout of acute pain Recommend 1-2# weight loss per week until ideal body weight through exercise & diet. Low fat/cholesterol diet. Gradually increase exercise from 15 min daily up to 1 hr per day 5 days/week. Avoid alcohol Follow up with Dr Lovell Sheehan about your hernias Consult w/ dietician Omeprazole 40mg  before breakfast & dinner. NO MORE

## 2012-01-13 ENCOUNTER — Other Ambulatory Visit: Payer: Self-pay

## 2012-01-13 ENCOUNTER — Telehealth (HOSPITAL_COMMUNITY): Payer: Self-pay | Admitting: Dietician

## 2012-01-13 DIAGNOSIS — R109 Unspecified abdominal pain: Secondary | ICD-10-CM

## 2012-01-13 NOTE — Progress Notes (Signed)
Quick Note:  Called and informed pt. Faxed standing order to [employer]. Called to confirm that it was received. They said that it was not a standing order and they will fax a copy of a standing order to [place] to be filled and and faxed back to them. ______

## 2012-01-13 NOTE — Progress Notes (Signed)
No PCP on file 

## 2012-01-13 NOTE — Progress Notes (Signed)
Quick Note:  I called back and explained to them, the order is just a one time order. Pt will come in if he has severe abdominal pain. Sue Lush said the order they received is OK. ______

## 2012-01-13 NOTE — Progress Notes (Signed)
A user error has taken place: encounter opened in error, closed for administrative reasons.  Pt left without being seen, rescheduled for later in the day.

## 2012-01-13 NOTE — Progress Notes (Signed)
agree

## 2012-01-13 NOTE — Progress Notes (Signed)
Quick Note:  Please call pt & let him know his liver tests were normal. However, if he has severe pain again, he should go to lab 4 hrs after onset of pain for LFTs & call us. Please arrange standing future LFT order. Thanks Cc:No primary provider on file.  ______

## 2012-01-13 NOTE — Telephone Encounter (Signed)
Received referral via fax from Lorenza Burton, NP [facility]) for dx: obesity. Noted previous referral on 11/13/11. Pt rescheduled appointment on 12/02/11. Pt was a no show for appointment on 12/09/11.

## 2012-01-14 ENCOUNTER — Telehealth: Payer: Self-pay | Admitting: *Deleted

## 2012-01-14 NOTE — Telephone Encounter (Signed)
Appointment scheduled for 01/21/12 at 11:00 AM.

## 2012-01-14 NOTE — Telephone Encounter (Signed)
Mr Leard called today. He believes that the change in his medication is making him worse. He would like someone to call him back.

## 2012-01-14 NOTE — Telephone Encounter (Signed)
LMOM to call.

## 2012-01-16 NOTE — Telephone Encounter (Signed)
Called and lmom [phone] for a return call.

## 2012-01-17 NOTE — Telephone Encounter (Signed)
Mailed appointment confirmation letter and instructions for appointment scheduled 01/21/12 at 11:00 AM via [place] Mail.

## 2012-01-20 NOTE — Telephone Encounter (Signed)
Called and left message on machine for pt to call if he has questions about his meds.

## 2012-01-21 ENCOUNTER — Emergency Department (HOSPITAL_COMMUNITY): Payer: Medicaid Other

## 2012-01-21 ENCOUNTER — Emergency Department (HOSPITAL_COMMUNITY)
Admission: EM | Admit: 2012-01-21 | Discharge: 2012-01-22 | Disposition: A | Discharge status: Home / Self Care | Payer: Medicaid Other | Attending: Emergency Medicine | Admitting: Emergency Medicine

## 2012-01-21 ENCOUNTER — Encounter (HOSPITAL_COMMUNITY): Payer: Self-pay | Admitting: *Deleted

## 2012-01-21 ENCOUNTER — Telehealth (HOSPITAL_COMMUNITY): Payer: Self-pay | Admitting: Dietician

## 2012-01-21 DIAGNOSIS — R1110 Vomiting, unspecified: Secondary | ICD-10-CM | POA: Insufficient documentation

## 2012-01-21 DIAGNOSIS — R109 Unspecified abdominal pain: Secondary | ICD-10-CM

## 2012-01-21 DIAGNOSIS — F3289 Other specified depressive episodes: Secondary | ICD-10-CM | POA: Insufficient documentation

## 2012-01-21 DIAGNOSIS — F329 Major depressive disorder, single episode, unspecified: Secondary | ICD-10-CM | POA: Insufficient documentation

## 2012-01-21 DIAGNOSIS — I10 Essential (primary) hypertension: Secondary | ICD-10-CM | POA: Insufficient documentation

## 2012-01-21 DIAGNOSIS — R10819 Abdominal tenderness, unspecified site: Secondary | ICD-10-CM | POA: Insufficient documentation

## 2012-01-21 DIAGNOSIS — Z9089 Acquired absence of other organs: Secondary | ICD-10-CM | POA: Insufficient documentation

## 2012-01-21 LAB — CBC WITH DIFFERENTIAL/PLATELET
Basophils Absolute: 0 10*3/uL (ref 0.0–0.1)
Eosinophils Absolute: 0.1 10*3/uL (ref 0.0–0.7)
Eosinophils Relative: 1 % (ref 0–5)
HCT: 43.1 % (ref 39.0–52.0)
Lymphocytes Relative: 30 % (ref 12–46)
MCH: 31.4 pg (ref 26.0–34.0)
MCHC: 34.3 g/dL (ref 30.0–36.0)
MCV: 91.5 fL (ref 78.0–100.0)
Monocytes Absolute: 0.8 10*3/uL (ref 0.1–1.0)
Platelets: 202 10*3/uL (ref 150–400)
RDW: 12.6 % (ref 11.5–15.5)
WBC: 10.7 10*3/uL — ABNORMAL HIGH (ref 4.0–10.5)

## 2012-01-21 LAB — RAPID URINE DRUG SCREEN, HOSP PERFORMED
Cocaine: NOT DETECTED
Opiates: NOT DETECTED

## 2012-01-21 LAB — COMPREHENSIVE METABOLIC PANEL
AST: 11 U/L (ref 0–37)
CO2: 26 mEq/L (ref 19–32)
Calcium: 9.3 mg/dL (ref 8.4–10.5)
Creatinine, Ser: 1.04 mg/dL (ref 0.50–1.35)
GFR calc Af Amer: >90 mL/min (ref >90)
GFR calc non Af Amer: >90 mL/min (ref >90)
Sodium: 135 mEq/L (ref 135–145)
Total Protein: 7 g/dL (ref 6.0–8.3)

## 2012-01-21 MED ORDER — PROMETHAZINE HCL 12.5 MG PO TABS: 25.0000 mg | ORAL_TABLET | Freq: Four times a day (QID) | ORAL | Status: DC | PRN

## 2012-01-21 MED ORDER — GI COCKTAIL ~~LOC~~
30.0000 mL | Freq: Once | ORAL | Status: AC
  Administered 2012-01-21: 30 mL via ORAL
  Filled 2012-01-21: qty 30

## 2012-01-21 MED ORDER — CARBAMAZEPINE ER 100 MG PO TB12
100.0000 mg | ORAL_TABLET | Freq: Four times a day (QID) | ORAL | Status: DC
  Administered 2012-01-22: 100 mg via ORAL
  Filled 2012-01-21 (×7): qty 1

## 2012-01-21 MED ORDER — ALBUTEROL SULFATE HFA 108 (90 BASE) MCG/ACT IN AERS: 2.0000 | INHALATION_SPRAY | Freq: Four times a day (QID) | RESPIRATORY_TRACT | Status: DC | PRN

## 2012-01-21 MED ORDER — TRAZODONE HCL 100 MG PO TABS
100.0000 mg | ORAL_TABLET | Freq: Every day | ORAL | Status: DC
  Administered 2012-01-22: 100 mg via ORAL
  Filled 2012-01-21 (×2): qty 1

## 2012-01-21 MED ORDER — ALPRAZOLAM 0.5 MG PO TABS
1.0000 mg | ORAL_TABLET | Freq: Four times a day (QID) | ORAL | Status: DC
  Administered 2012-01-22: 1 mg via ORAL
  Filled 2012-01-21: qty 2

## 2012-01-21 MED ORDER — PANTOPRAZOLE SODIUM 40 MG PO TBEC: 80.0000 mg | DELAYED_RELEASE_TABLET | Freq: Every day | ORAL | Status: DC

## 2012-01-21 MED ORDER — TOPIRAMATE 100 MG PO TABS
100.0000 mg | ORAL_TABLET | Freq: Two times a day (BID) | ORAL | Status: DC
  Administered 2012-01-22: 100 mg via ORAL
  Filled 2012-01-21 (×4): qty 1

## 2012-01-21 MED ORDER — LISINOPRIL-HYDROCHLOROTHIAZIDE 20-25 MG PO TABS: 1.0000 | ORAL_TABLET | Freq: Every day | ORAL | Status: DC

## 2012-01-21 MED ORDER — LISINOPRIL 20 MG PO TABS
20.0000 mg | ORAL_TABLET | Freq: Every day | ORAL | Status: DC
  Filled 2012-01-21 (×2): qty 1

## 2012-01-21 MED ORDER — CITALOPRAM HYDROBROMIDE 40 MG PO TABS
40.0000 mg | ORAL_TABLET | Freq: Every day | ORAL | Status: DC
  Filled 2012-01-21 (×2): qty 1

## 2012-01-21 MED ORDER — HYDROCHLOROTHIAZIDE 25 MG PO TABS
25.0000 mg | ORAL_TABLET | Freq: Every day | ORAL | Status: DC
  Filled 2012-01-21 (×2): qty 1

## 2012-01-21 NOTE — Telephone Encounter (Signed)
Pt was a no-show for appointment (01/21/12 at 11:00 AM). Noted that this is the 3rd missed appointment in 2 months.

## 2012-01-21 NOTE — ED Notes (Signed)
Pt with upper abd pain and vomiting-hx acid reflux per pt, states he thinks it's messing with his depression, states that he is on depression medication as well

## 2012-01-21 NOTE — Telephone Encounter (Signed)
Sent letter to pt home notifying pt of no-show and requesting rescheduling appointment.  

## 2012-01-21 NOTE — ED Notes (Signed)
Pt presents with upper abdominal pain, N/V and acid reflux. Pt takes medication for reflux without relief. Pt has 4 hernias, umbilicus and hiatal hernia's per pt. States has seen Dr Lovell Sheehan for said hernias. Reflux is managed by Aaron Edelman gastro MD.  Pt also reports worsening depression, denies SI/HI. Pt reports vomiting liquid last at 1930. No acute distress noted.

## 2012-01-21 NOTE — Telephone Encounter (Signed)
Pt called today to let us know that the PPI is not working like we told him to use it. His stomach is burning really bad. He would like to know what he can do. He is also asking about some kind of surgery to fix this problem. Please advise

## 2012-01-21 NOTE — ED Notes (Signed)
Denies SI or HI 

## 2012-01-21 NOTE — Telephone Encounter (Signed)
If it has been 4 hrs since pain started, he needs LFTs. Pt has failed all PPIs. No others to try. May need tertiary referral. Thanks KJ

## 2012-01-21 NOTE — Telephone Encounter (Signed)
LMOM to call back

## 2012-01-21 NOTE — ED Provider Notes (Signed)
History  This chart was scribed for [employer]. Rubin Payor, MD by Bennett Scrape. This patient was seen in room APA19/APA19 and the patient's care was started at 2214.  CSN: [id number]  Arrival date & time 01/21/12  2016   First MD Initiated Contact with Patient 01/21/12 2214      Chief Complaint  Patient presents with  . Depression  . Abdominal Pain     The history is provided by the patient. No language interpreter was used.    [name] is a 31 y.o. male with a h/o GERD and depression who presents to the Emergency Department complaining of 12 hours of constant epigastric abdominal pain described as burning with associated emesis due to GERD. He also reports that the GERD has been exacerbating his depression recently. He states that he has felt SI previously but denies SI or HI currently. He reports taking 40 mg omeprazole as well as medication for depression daily with no improvement in any of his symptoms. He states that he was recently put on Zantec 150 but states that this has only exacerbated his symptoms.  He also reports that he is following up with both [facility] and [facility] for his problems but states that his next appointment to both is in January 2014. He denies fever, neck pain, sore throat, visual disturbance, CP, cough, SOB, nausea, diarrhea, urinary symptoms, back pain, HA, weakness, numbness and rash as associated symptoms.  He also has a h/o HTN and anxiety. He is a current everyday smoker but denies alcohol use.    Past Medical History  Diagnosis Date  . GERD (gastroesophageal reflux disease)     Bravo study Nov 2009 on Prilosec 40 BID with adequate acid suppression  . Hypertension   . Anxiety   . Depression     increased over past several months  . History of MRSA infection     pt denies  . Chronic abdominal pain     HIDA scan Sept 2010, EF 93%, s/p chole oct 2010; evaluated at [facility] for abdominal wall pain; Imipramine added May 2011  .  Chronic vomiting     normal GES 2009  . S/P endoscopy     2008 Dr. Karilyn Cota: erosive reflux esophagitis, antral gastritis, H.pylori serologies neg, Nov 2009 Dr. Darrick Penna: gastritis, benign esophageal polyp, no H.pylori,  Feb 2011: [facility], Dr. Bubba Hales: normal esophagus, normal stomach, normal duodenum, path unremarkable  . Ventral hernia     Past Surgical History  Procedure Date  . Ventral hernia repair March 2012    Dr. Lovell Sheehan  . Knee surgery 2009  . Cholecystectomy 10/10  . Appendectomy 5/10  . Esophagogastroduodenoscopy 05/2006    H.pylori neg  . Inguinal hernia repair     child  . Esophagogastroduodenoscopy 03/23/2008    Fields-gastritis benign esophageal polyp, otherwise normal. Negative H. pylori he (propofol)    Family History  Problem Relation Age of Onset  . Colon cancer Neg Hx     History  Substance Use Topics  . Smoking status: Light Tobacco Smoker -- 0.5 packs/day    Types: Cigarettes  . Smokeless tobacco: Never Used   Comment: Less than 1/2 pk a day. trying to quit.   . Alcohol Use: No      Review of Systems  Constitutional: Negative for fever and chills.       Per HPI, otherwise negative  HENT:       Per HPI, otherwise negative  Eyes: Negative.   Respiratory:  Per HPI, otherwise negative  Cardiovascular:       Per HPI, otherwise negative  Gastrointestinal: Positive for vomiting and abdominal pain. Negative for nausea.  Genitourinary: Negative.   Musculoskeletal:       Per HPI, otherwise negative  Skin: Negative.   Neurological: Negative.   Psychiatric/Behavioral: Negative for suicidal ideas.       Positive for depression    Allergies  Dexilant; Propoxyphene-acetaminophen; and Zofran  Home Medications   Current Outpatient Rx  Name Route Sig Dispense Refill  . ALBUTEROL SULFATE HFA 108 (90 BASE) MCG/ACT IN AERS Inhalation Inhale 2 puffs into the lungs every 6 (six) hours as needed. Shortness of breath     . ALPRAZOLAM 1 MG PO TABS Oral  Take 1 mg by mouth 4 (four) times daily.      Marland Kitchen CARBAMAZEPINE ER 100 MG PO TB12 Oral Take 100 mg by mouth 4 (four) times daily.    Marland Kitchen CITALOPRAM HYDROBROMIDE 40 MG PO TABS Oral Take 40 mg by mouth daily.    Marland Kitchen LISINOPRIL-HYDROCHLOROTHIAZIDE 20-25 MG PO TABS Oral Take 1 tablet by mouth daily.      Marland Kitchen OMEPRAZOLE 40 MG PO CPDR Oral Take 40-80 mg by mouth 2 (two) times daily. 1 PO 30 MINUTES PRIOR TO MEALS BID    . PROMETHAZINE HCL 25 MG PO TABS Oral Take 25 mg by mouth every 6 (six) hours as needed. For nausea/vomiting    . TOPIRAMATE 100 MG PO TABS Oral Take 100 mg by mouth 2 (two) times daily.      . TRAZODONE HCL 100 MG PO TABS Oral Take 100 mg by mouth at bedtime.      Triage Vitals: BP 132/88  Pulse 88  Temp 98.4 F (36.9 C) (Oral)  Resp 20  Ht 5\' 9"  (1.753 m)  Wt 290 lb (131.543 kg)  BMI 42.83 kg/m2  SpO2 100%  Physical Exam  Nursing note and vitals reviewed. Constitutional: He is oriented to person, place, and time. He appears well-developed and well-nourished. No distress.  HENT:  Head: Normocephalic and atraumatic.  Eyes: Conjunctivae normal and EOM are normal.  Neck: Neck supple. No tracheal deviation present.  Cardiovascular: Normal rate, regular rhythm and normal heart sounds.   Pulmonary/Chest: Effort normal and breath sounds normal. No respiratory distress.  Abdominal: Soft. There is tenderness. There is no rebound and no guarding.       bilateral upper abdomen tenderness, no guarding, no echymosis  Musculoskeletal: Normal range of motion.  Neurological: He is alert and oriented to person, place, and time.  Skin: Skin is warm and dry.  Psychiatric: His behavior is normal.       Appears depressed    ED Course  Procedures (including critical care time)  DIAGNOSTIC STUDIES: Oxygen Saturation is 100% on room air, normal by my interpretation.    COORDINATION OF CARE: 2220-Discussed treatment plan with pt at bedside and pt agreed to plan. A CXR, CBC with differential,  blood panel as well as urine panel were ordered.    Labs Reviewed  CBC WITH DIFFERENTIAL - Abnormal; Notable for the following:    WBC 10.7 (*)     All other components within normal limits  COMPREHENSIVE METABOLIC PANEL - Abnormal; Notable for the following:    Glucose, Bld 105 (*)     All other components within normal limits  URINALYSIS, ROUTINE W REFLEX MICROSCOPIC - Abnormal; Notable for the following:    pH 8.5 (*)     Protein,  ur 30 (*)     All other components within normal limits  URINE RAPID DRUG SCREEN (HOSP PERFORMED) - Abnormal; Notable for the following:    Benzodiazepines POSITIVE (*)     Tetrahydrocannabinol POSITIVE (*)     All other components within normal limits  CARBAMAZEPINE LEVEL, TOTAL - Abnormal; Notable for the following:    Carbamazepine Lvl 1.3 (*)     All other components within normal limits  LIPASE, BLOOD  ETHANOL  URINE MICROSCOPIC-ADD ON   Dg Abd Acute W/chest  01/21/2012  *RADIOLOGY REPORT*  Clinical Data: Abdominal pain  ACUTE ABDOMEN SERIES (ABDOMEN 2 VIEW & CHEST 1 VIEW)  Comparison: 01/07/2012  Findings: Normal cardiac and mediastinal silhouette.  Clear lung fields.  Low lung volumes.  No free air.  Nonobstructive gas pattern.  Cholecystectomy.  Normal osseous structures. No change from priors.  IMPRESSION: Negative exam.   Original Report Authenticated By: Elsie Stain, M.D.      1. Abdominal pain   2. Depression       MDM  Patient with chronic abdominal pain and reflux. He states reflexes make him depressed. He has a history depression. He'll not commit about whether he has suicidal thoughts. Lab work is overall reassuring. He'll be seen by ACT team now.  I personally performed the services described in this documentation, which was scribed in my presence. The recorded information has been reviewed and considered.    A was  Juliet Rude. Rubin Payor, MD 01/22/12 [phone]

## 2012-01-22 LAB — URINALYSIS, ROUTINE W REFLEX MICROSCOPIC
Leukocytes, UA: NEGATIVE
Protein, ur: 30 mg/dL — AB
Specific Gravity, Urine: 1.01 (ref 1.005–1.030)
Urobilinogen, UA: 1 mg/dL (ref 0.0–1.0)

## 2012-01-22 LAB — URINE MICROSCOPIC-ADD ON

## 2012-01-22 MED ORDER — CARBAMAZEPINE ER 100 MG PO TB12
ORAL_TABLET | ORAL | Status: AC
  Filled 2012-01-22: qty 1

## 2012-01-22 MED ORDER — TRAZODONE HCL 50 MG PO TABS
ORAL_TABLET | ORAL | Status: AC
  Filled 2012-01-22: qty 2

## 2012-01-22 MED ORDER — TOPIRAMATE 25 MG PO TABS
ORAL_TABLET | ORAL | Status: AC
  Filled 2012-01-22: qty 4

## 2012-01-22 NOTE — BH Assessment (Signed)
Assessment Note   [name] is an 31 y.o. male. PT PRESENTED TO THE ED FOR MEDICAL COMPLAINTS AND DID MENTION HE HAS HAD S/I BUT IS NOT CURRENTLY SUICIDAL NOR HOMICIDAL.  HE FEELS HIS MEDS ARE NOT WORKING AND WILL NOT SEE HIS PSYCHIATRIST AT [facility] UNTIL January 2014.  PT REPORTS HE HAS NEVER ATTEMPTED SUICIDE AND HAS NO PLAN TO DO SO.  HE IS NOT PSYCHOTIC NOR DELUSIONAL. PT CONTRACTED FOR SAFETY  AND WILL GO TO [facility] TOMORROW MORNING TO SEE IF HE GET HIS MEDS EVALUATED.     Axis I: Bipolar, Depressed Axis II: Deferred Axis III:  Past Medical History  Diagnosis Date  . GERD (gastroesophageal reflux disease)     Bravo study Nov 2009 on Prilosec 40 BID with adequate acid suppression  . Hypertension   . Anxiety   . Depression     increased over past several months  . History of MRSA infection     pt denies  . Chronic abdominal pain     HIDA scan Sept 2010, EF 93%, s/p chole oct 2010; evaluated at [facility] for abdominal wall pain; Imipramine added May 2011  . Chronic vomiting     normal GES 2009  . S/P endoscopy     2008 Dr. Karilyn Cota: erosive reflux esophagitis, antral gastritis, H.pylori serologies neg, Nov 2009 Dr. Darrick Penna: gastritis, benign esophageal polyp, no H.pylori,  Feb 2011: [facility], Dr. Bubba Hales: normal esophagus, normal stomach, normal duodenum, path unremarkable  . Ventral hernia    Axis IV: economic problems, other psychosocial or environmental problems and problems with access to health care services Axis V: 51-60 moderate symptoms       Past Medical History:  Past Medical History  Diagnosis Date  . GERD (gastroesophageal reflux disease)     Bravo study Nov 2009 on Prilosec 40 BID with adequate acid suppression  . Hypertension   . Anxiety   . Depression     increased over past several months  . History of MRSA infection     pt denies  . Chronic abdominal pain     HIDA scan Sept 2010, EF 93%, s/p chole oct 2010; evaluated at [facility] for abdominal wall  pain; Imipramine added May 2011  . Chronic vomiting     normal GES 2009  . S/P endoscopy     2008 Dr. Karilyn Cota: erosive reflux esophagitis, antral gastritis, H.pylori serologies neg, Nov 2009 Dr. Darrick Penna: gastritis, benign esophageal polyp, no H.pylori,  Feb 2011: [facility], Dr. Bubba Hales: normal esophagus, normal stomach, normal duodenum, path unremarkable  . Ventral hernia     Past Surgical History  Procedure Date  . Ventral hernia repair March 2012    Dr. Lovell Sheehan  . Knee surgery 2009  . Cholecystectomy 10/10  . Appendectomy 5/10  . Esophagogastroduodenoscopy 05/2006    H.pylori neg  . Inguinal hernia repair     child  . Esophagogastroduodenoscopy 03/23/2008    Fields-gastritis benign esophageal polyp, otherwise normal. Negative H. pylori he (propofol)    Family History:  Family History  Problem Relation Age of Onset  . Colon cancer Neg Hx     Social History:  reports that he has been smoking Cigarettes.  He has been smoking about .5 packs per day. He has never used smokeless tobacco. He reports that he does not drink alcohol or use illicit drugs.  Additional Social History:  Alcohol / Drug Use Pain Medications: na Prescriptions: na Over the Counter: na History of alcohol / drug use?: No  history of alcohol / drug abuse  CIWA: CIWA-Ar BP: 132/88 mmHg Pulse Rate: 88  COWS:    Allergies:  Allergies  Allergen Reactions  . Dexilant (Dexlansoprazole) Other (See Comments)    Pass out  . Propoxyphene-Acetaminophen Other (See Comments)    UNKNOWN REACTION   . Zofran (Ondansetron Hcl) Nausea And Vomiting    Home Medications:  (Not in a hospital admission)  OB/GYN Status:  No LMP for male patient.  General Assessment Data Location of Assessment: [facility] ACT Assessment: Yes Living Arrangements: Alone Can pt return to current living arrangement?: Yes Admission Status: Voluntary Is patient capable of signing voluntary admission?: Yes Transfer from: Acute Hospital [facility] Referral Source: MD (DR Rubin Payor)  Education Status Contact person: LINDA Silvernail-[phone]  Risk to self Suicidal Ideation: No Suicidal Intent: No Is patient at risk for suicide?: No Suicidal Plan?: No Access to Means: No What has been your use of drugs/alcohol within the last 12 months?: NA Previous Attempts/Gestures: No How many times?: 0  Other Self Harm Risks: NA Triggers for Past Attempts: None known Intentional Self Injurious Behavior: None Family Suicide History: No Recent stressful life event(s): Conflict (Comment) (WITH BABY'S MOTHER) Persecutory voices/beliefs?: No Depression: Yes Depression Symptoms: Despondent;Tearfulness;Loss of interest in usual pleasures;Feeling worthless/self pity Substance abuse history and/or treatment for substance abuse?: No Suicide prevention information given to non-admitted patients: Yes  Risk to Others Homicidal Ideation: No Thoughts of Harm to Others: No Current Homicidal Intent: No Current Homicidal Plan: No Access to Homicidal Means: No History of harm to others?: No Assessment of Violence: None Noted Violent Behavior Description: NA Does patient have access to weapons?: No Criminal Charges Pending?: No Does patient have a court date: No  Psychosis Hallucinations: None noted Delusions: None noted  Mental Status Report Appear/Hygiene: Improved Eye Contact: Good Motor Activity: Freedom of movement Speech: Logical/coherent Level of Consciousness: Alert Mood: Depressed;Despair;Helpless;Sad Affect: Appropriate to circumstance;Depressed Anxiety Level: Minimal Thought Processes: Coherent;Relevant Judgement: Unimpaired Orientation: Person;Place;Time;Situation Obsessive Compulsive Thoughts/Behaviors: None  Cognitive Functioning Concentration: Normal Memory: Recent Intact;Remote Intact IQ: Average Insight: Fair Impulse Control: Good Appetite: Good Sleep: No Change Total Hours of Sleep: 8  (WITH  MEDICATION) Vegetative Symptoms: None  ADLScreening [facility] Assessment Services) Patient's cognitive ability adequate to safely complete daily activities?: Yes Patient able to express need for assistance with ADLs?: Yes Independently performs ADLs?: Yes (appropriate for developmental age)  Abuse/Neglect [facility]) Physical Abuse: Denies Verbal Abuse: Denies Sexual Abuse: Denies  Prior Inpatient Therapy Prior Inpatient Therapy: No  Prior Outpatient Therapy Prior Outpatient Therapy: Yes Prior Therapy Dates: 6 YRS TO CURRENT Prior Therapy Facilty/Provider(s): [facility] Reason for Treatment: DEPRESSION, ANXIETY, PTSD  ADL Screening (condition at time of admission) Patient's cognitive ability adequate to safely complete daily activities?: Yes Patient able to express need for assistance with ADLs?: Yes Independently performs ADLs?: Yes (appropriate for developmental age) Weakness of Arms/Hands: None  Home Assistive Devices/Equipment Home Assistive Devices/Equipment: None  Therapy Consults (therapy consults require a physician order) PT Evaluation Needed: No OT Evalulation Needed: No SLP Evaluation Needed: No Abuse/Neglect Assessment (Assessment to be complete while patient is alone) Physical Abuse: Denies Verbal Abuse: Denies Sexual Abuse: Denies Exploitation of patient/patient's resources: Denies Self-Neglect: Denies Values / Beliefs Cultural Requests During Hospitalization: None Spiritual Requests During Hospitalization: None Consults Spiritual Care Consult Needed: No Social Work Consult Needed: No Merchant navy officer (For Healthcare) Advance Directive: Patient does not have advance directive;Patient would not like information Pre-existing out of facility DNR order (yellow form or  pink MOST form): No    Additional Information 1:1 In Past 12 Months?: No CIRT Risk: No Elopement Risk: No Does patient have medical clearance?: Yes     Disposition:  Disposition Disposition of  Patient: Referred to Patient referred to: Other (Comment) [facility])  On Site Evaluation by:   Reviewed with Physician:  DR PICKERING/ DR Alben Deeds Winford 01/22/2012 1:39 AM

## 2012-01-22 NOTE — ED Provider Notes (Signed)
The patient is no longer having suicidal thoughts, he has signed a no harm contract and will follow up with [facility].  His labs show mild proteinuria - has been informed of need for recheck, otherwise appears stable for d/c.  Vida Roller, MD 01/22/12 [phone]

## 2012-01-24 ENCOUNTER — Other Ambulatory Visit: Payer: Self-pay | Admitting: Gastroenterology

## 2012-01-24 ENCOUNTER — Telehealth: Payer: Self-pay | Admitting: *Deleted

## 2012-01-24 NOTE — Telephone Encounter (Signed)
Mr Briton called today. He is in need of refills on his medications and he is having some slight pain. He would like a call back. Thanks.

## 2012-01-27 ENCOUNTER — Other Ambulatory Visit: Payer: Self-pay

## 2012-01-27 DIAGNOSIS — R1110 Vomiting, unspecified: Secondary | ICD-10-CM

## 2012-01-27 MED ORDER — OMEPRAZOLE 40 MG PO CPDR: 40.0000 mg | DELAYED_RELEASE_CAPSULE | Freq: Two times a day (BID) | ORAL | Status: DC

## 2012-01-27 NOTE — Telephone Encounter (Signed)
Pt called this morning to let us know he has been hurting all weekend. He has been vomiting also. He has not had any lab work done. He wants to know if we can refer him to a surgery with in the [facility] because he has [facility] assistance. He also want to know if he should come in today because of the vomiting. Please advise

## 2012-01-27 NOTE — Telephone Encounter (Signed)
Pt aware of what was said. I am going to fax over the orders for the labs.

## 2012-01-27 NOTE — Telephone Encounter (Signed)
Please let pt know he needs LFTs & met 7 now re: transaminitis/vomiting Phenergan 25mg  every 6 hrs as needed for nausea If severe pain or unable to keep down fluids go directly to ER I will discuss other issues w/ Dr Darrick Penna once labs are back Thanks

## 2012-01-28 LAB — BASIC METABOLIC PANEL
Calcium: 10.4 mg/dL (ref 8.4–10.5)
Creat: 1.19 mg/dL (ref 0.50–1.35)

## 2012-01-28 LAB — HEPATIC FUNCTION PANEL
AST: 15 U/L (ref 0–37)
Albumin: 5.1 g/dL (ref 3.5–5.2)
Alkaline Phosphatase: 99 U/L (ref 39–117)
Total Protein: 8.1 g/dL (ref 6.0–8.3)

## 2012-01-28 NOTE — Progress Notes (Signed)
Quick Note:  Please let pt know labs look great. Discussed w/ Dr Darrick Penna. Pt needs referral to [facility] re: chronic abdominal pain & refractory GERD Please arrange. Thanks Cc:No primary provider on file.  ______

## 2012-01-28 NOTE — Progress Notes (Signed)
Referral has been sent to [facility] they will be in contact with the patient with date and time of the appointment

## 2012-01-28 NOTE — Telephone Encounter (Signed)
Pt called back to see what he can do because he is so much pain from the reflux. I told him that we have faxed the referral to [facility] and they will call him with the appointment and the time. Please advise

## 2012-01-29 NOTE — Telephone Encounter (Signed)
Please call in carafate slurries 1 gram QID, #QS x 2 weeks, 0RF. If severe pain, he should go to ER. Thanks

## 2012-01-29 NOTE — Telephone Encounter (Signed)
LMOM to call back

## 2012-01-29 NOTE — Telephone Encounter (Signed)
Pt called back and he is aware of new Rx

## 2012-01-30 NOTE — Progress Notes (Signed)
Patient ID: [name], male   DOB: 11/14/1980, 32 y.o.   MRN: [id number] Late entry:      I called in Rx to [facility] on 01/29/12 and Pt is aware.

## 2012-02-12 ENCOUNTER — Telehealth: Payer: Self-pay | Admitting: Gastroenterology

## 2012-02-12 NOTE — Telephone Encounter (Signed)
Per Dr. Darrick Penna, I told pt to go to the ED.

## 2012-02-12 NOTE — Telephone Encounter (Signed)
I have talked to the patient. He said he has been doing well on the Protonix bid and the Carafate, until today. He has vomited x 4 today, and only had water to drink. He thinks the medicine is not working now. He is having some some abdominal pain. He just does not want to end up back at the ED. He said his appt at [facility] is 03/07/2012. He has no showed x 3 with the nutrionalist. Please advise!

## 2012-02-12 NOTE — Telephone Encounter (Signed)
Pt came to window to say the medicine he takes that dissolves in water has been working up until yesterday and he has been vomiting ever since. Please advise what he can take. He is waiting in the waiting room to speak with nurse.

## 2012-02-12 NOTE — Telephone Encounter (Signed)
REVIEWED.  

## 2012-02-20 NOTE — Progress Notes (Signed)
REVIEWED.  

## 2012-02-25 ENCOUNTER — Emergency Department (HOSPITAL_COMMUNITY)
Admission: EM | Admit: 2012-02-25 | Discharge: 2012-02-25 | Disposition: A | Discharge status: Home / Self Care | Payer: Medicaid Other | Attending: Emergency Medicine | Admitting: Emergency Medicine

## 2012-02-25 ENCOUNTER — Encounter (HOSPITAL_COMMUNITY): Payer: Self-pay

## 2012-02-25 ENCOUNTER — Emergency Department (HOSPITAL_COMMUNITY): Payer: Medicaid Other

## 2012-02-25 DIAGNOSIS — F329 Major depressive disorder, single episode, unspecified: Secondary | ICD-10-CM | POA: Insufficient documentation

## 2012-02-25 DIAGNOSIS — F17200 Nicotine dependence, unspecified, uncomplicated: Secondary | ICD-10-CM | POA: Insufficient documentation

## 2012-02-25 DIAGNOSIS — Z9889 Other specified postprocedural states: Secondary | ICD-10-CM | POA: Insufficient documentation

## 2012-02-25 DIAGNOSIS — R109 Unspecified abdominal pain: Secondary | ICD-10-CM | POA: Insufficient documentation

## 2012-02-25 DIAGNOSIS — K219 Gastro-esophageal reflux disease without esophagitis: Secondary | ICD-10-CM | POA: Insufficient documentation

## 2012-02-25 DIAGNOSIS — Z79899 Other long term (current) drug therapy: Secondary | ICD-10-CM | POA: Insufficient documentation

## 2012-02-25 DIAGNOSIS — Z8614 Personal history of Methicillin resistant Staphylococcus aureus infection: Secondary | ICD-10-CM | POA: Insufficient documentation

## 2012-02-25 DIAGNOSIS — F411 Generalized anxiety disorder: Secondary | ICD-10-CM | POA: Insufficient documentation

## 2012-02-25 DIAGNOSIS — F3289 Other specified depressive episodes: Secondary | ICD-10-CM | POA: Insufficient documentation

## 2012-02-25 DIAGNOSIS — I10 Essential (primary) hypertension: Secondary | ICD-10-CM | POA: Insufficient documentation

## 2012-02-25 LAB — CBC WITH DIFFERENTIAL/PLATELET
Basophils Relative: 0 % (ref 0–1)
Hemoglobin: 15.7 g/dL (ref 13.0–17.0)
Lymphocytes Relative: 27 % (ref 12–46)
Lymphs Abs: 2.7 10*3/uL (ref 0.7–4.0)
MCHC: 34.2 g/dL (ref 30.0–36.0)
Monocytes Relative: 7 % (ref 3–12)
Neutro Abs: 6.5 10*3/uL (ref 1.7–7.7)
Neutrophils Relative %: 65 % (ref 43–77)
RBC: 4.93 MIL/uL (ref 4.22–5.81)
WBC: 10 10*3/uL (ref 4.0–10.5)

## 2012-02-25 LAB — COMPREHENSIVE METABOLIC PANEL
Albumin: 4.5 g/dL (ref 3.5–5.2)
Alkaline Phosphatase: 88 U/L (ref 39–117)
BUN: 11 mg/dL (ref 6–23)
Chloride: 99 mEq/L (ref 96–112)
Potassium: 3.6 mEq/L (ref 3.5–5.1)
Total Bilirubin: 0.7 mg/dL (ref 0.3–1.2)

## 2012-02-25 LAB — URINALYSIS, ROUTINE W REFLEX MICROSCOPIC
Bilirubin Urine: NEGATIVE
Glucose, UA: NEGATIVE mg/dL
Ketones, ur: NEGATIVE mg/dL
Nitrite: NEGATIVE
pH: 5.5 (ref 5.0–8.0)

## 2012-02-25 LAB — URINE MICROSCOPIC-ADD ON

## 2012-02-25 LAB — LIPASE, BLOOD: Lipase: 20 U/L (ref 11–59)

## 2012-02-25 MED ORDER — HYDROMORPHONE HCL PF 1 MG/ML IJ SOLN
1.0000 mg | Freq: Once | INTRAMUSCULAR | Status: AC
  Administered 2012-02-25: 1 mg via INTRAVENOUS
  Filled 2012-02-25: qty 1

## 2012-02-25 MED ORDER — ONDANSETRON HCL 4 MG/2ML IJ SOLN
4.0000 mg | Freq: Once | INTRAMUSCULAR | Status: AC
  Administered 2012-02-25: 4 mg via INTRAVENOUS
  Filled 2012-02-25: qty 2

## 2012-02-25 MED ORDER — OXYCODONE-ACETAMINOPHEN 5-325 MG PO TABS: 1.0000 | ORAL_TABLET | Freq: Four times a day (QID) | ORAL | Status: AC | PRN

## 2012-02-25 MED ORDER — SODIUM CHLORIDE 0.9 % IV BOLUS (SEPSIS)
1000.0000 mL | Freq: Once | INTRAVENOUS | Status: AC
  Administered 2012-02-25: 1000 mL via INTRAVENOUS

## 2012-02-25 MED ORDER — IOHEXOL 300 MG/ML  SOLN
100.0000 mL | Freq: Once | INTRAMUSCULAR | Status: AC | PRN
  Administered 2012-02-25: 100 mL via INTRAVENOUS

## 2012-02-25 MED ORDER — PROMETHAZINE HCL 25 MG PO TABS: 25.0000 mg | ORAL_TABLET | Freq: Four times a day (QID) | ORAL | Status: DC | PRN

## 2012-02-25 NOTE — ED Notes (Signed)
Patient transported to CT 

## 2012-02-25 NOTE — ED Provider Notes (Cosign Needed)
History  This chart was scribed for Jeffrey Lennert, MD by Bennett Scrape. This patient was seen in room [facility] and the patient's care was started at 3:06PM.  CSN: [id number]  Arrival date & time 02/25/12  1229   First MD Initiated Contact with Patient 02/25/12 1506      Chief Complaint  Patient presents with  . Abdominal Pain     Patient is a 31 y.o. male presenting with abdominal pain. The history is provided by the patient. No language interpreter was used.  Abdominal Pain The primary symptoms of the illness include abdominal pain, nausea and vomiting. The primary symptoms of the illness do not include fatigue or diarrhea. The current episode started yesterday. The onset of the illness was gradual. The problem has been gradually worsening.  The abdominal pain is located in the periumbilical region.  Symptoms associated with the illness do not include hematuria, frequency or back pain. Significant associated medical issues include GERD. Significant associated medical issues do not include diabetes or diverticulitis.    [name] is a 31 y.o. male who presents to the Emergency Department complaining of approximately 24 hours of gradual onset, gradually worsening, constant periumbilical abdominal pain with associated 7 episodes of non-bloody emesis. He rates his pain an 8.5 out of 10 at its worse and a 3 or 4 out of 10 at its best. He states that the pain is worse with standing. He reports that he has a h/o ventral hernias and reports that the pain is similar to pain attributed to his hernias with the most recent episode of similar pain occuring 3 to 4 weeks ago. He has a h/o hernia repair performed by Dr. Lovell Sheehan in June 2012 and states that he took some left over 7.5-325 Percocet from his surgery with no improvement. He denies fevers, chills and diarrhea as associated symptoms. He has a surgical h/o cholecystectomy, and appendectomy and a medical h/o GERD, HTN, anxiety, and  depression. He is a current everyday smoker but denies alcohol use.  Past Medical History  Diagnosis Date  . GERD (gastroesophageal reflux disease)     Bravo study Nov 2009 on Prilosec 40 BID with adequate acid suppression  . Hypertension   . Anxiety   . Depression     increased over past several months  . History of MRSA infection     pt denies  . Chronic abdominal pain     HIDA scan Sept 2010, EF 93%, s/p chole oct 2010; evaluated at [facility] for abdominal wall pain; Imipramine added May 2011  . Chronic vomiting     normal GES 2009  . S/P endoscopy     2008 Dr. Karilyn Cota: erosive reflux esophagitis, antral gastritis, H.pylori serologies neg, Nov 2009 Dr. Darrick Penna: gastritis, benign esophageal polyp, no H.pylori,  Feb 2011: [facility], Dr. Bubba Hales: normal esophagus, normal stomach, normal duodenum, path unremarkable  . Ventral hernia     Past Surgical History  Procedure Date  . Ventral hernia repair March 2012    Dr. Lovell Sheehan  . Knee surgery 2009  . Cholecystectomy 10/10  . Appendectomy 5/10  . Esophagogastroduodenoscopy 05/2006    H.pylori neg  . Inguinal hernia repair     child  . Esophagogastroduodenoscopy 03/23/2008    Fields-gastritis benign esophageal polyp, otherwise normal. Negative H. pylori he (propofol)    Family History  Problem Relation Age of Onset  . Colon cancer Neg Hx     History  Substance Use Topics  . Smoking status: Current  Every Day Smoker -- 0.5 packs/day    Types: Cigarettes  . Smokeless tobacco: Never Used   Comment: Less than 1/2 pk a day. trying to quit.   . Alcohol Use: No      Review of Systems  Constitutional: Negative for fatigue.  HENT: Negative for congestion, sinus pressure and ear discharge.   Eyes: Negative for discharge.  Respiratory: Negative for cough.   Cardiovascular: Negative for chest pain.  Gastrointestinal: Positive for nausea, vomiting and abdominal pain. Negative for diarrhea.  Genitourinary: Negative for frequency and  hematuria.  Musculoskeletal: Negative for back pain.  Skin: Negative for rash.  Neurological: Negative for seizures and headaches.  Hematological: Negative.   Psychiatric/Behavioral: Negative for hallucinations.    Allergies  Dexilant; Propoxyphene-acetaminophen; and Zofran  Home Medications   Current Outpatient Rx  Name Route Sig Dispense Refill  . ALBUTEROL SULFATE HFA 108 (90 BASE) MCG/ACT IN AERS Inhalation Inhale 2 puffs into the lungs every 6 (six) hours as needed. Shortness of breath     . ALPRAZOLAM 1 MG PO TABS Oral Take 1 mg by mouth 4 (four) times daily.      Marland Kitchen CARBAMAZEPINE ER 100 MG PO TB12 Oral Take 100 mg by mouth 4 (four) times daily.    Marland Kitchen CITALOPRAM HYDROBROMIDE 40 MG PO TABS Oral Take 40 mg by mouth daily.    Marland Kitchen LISINOPRIL-HYDROCHLOROTHIAZIDE 20-25 MG PO TABS Oral Take 1 tablet by mouth daily.      Marland Kitchen OMEPRAZOLE 40 MG PO CPDR Oral Take 1 capsule (40 mg total) by mouth 2 (two) times daily. 60 capsule 5  . OXYCODONE-ACETAMINOPHEN 7.5-325 MG PO TABS Oral Take 1 tablet by mouth every 4 (four) hours as needed. Pain    . PROMETHAZINE HCL 25 MG PO TABS Oral Take 25 mg by mouth every 6 (six) hours as needed. For nausea/vomiting    . TOPIRAMATE 100 MG PO TABS Oral Take 100 mg by mouth 2 (two) times daily.      . TRAZODONE HCL 100 MG PO TABS Oral Take 100 mg by mouth at bedtime.      Triage Vitals: BP 145/79  Pulse 95  Temp 98.1 F (36.7 C) (Oral)  Resp 18  Ht 5\' 9"  (1.753 m)  Wt 280 lb (127.007 kg)  BMI 41.35 kg/m2  SpO2 97%  Physical Exam  Nursing note and vitals reviewed. Constitutional: He is oriented to person, place, and time. He appears well-developed and well-nourished.  HENT:  Head: Normocephalic and atraumatic.  Eyes: Conjunctivae normal and EOM are normal. No scleral icterus.  Neck: Neck supple. No thyromegaly present.  Cardiovascular: Normal rate and regular rhythm.  Exam reveals no gallop and no friction rub.   No murmur heard. Pulmonary/Chest: Effort  normal and breath sounds normal. No stridor. He has no wheezes. He has no rales. He exhibits no tenderness.  Abdominal: He exhibits no distension. There is tenderness (moderately tender in the periumbilical region). There is no rebound.  Musculoskeletal: Normal range of motion. He exhibits no edema.  Lymphadenopathy:    He has no cervical adenopathy.  Neurological: He is oriented to person, place, and time. Coordination normal.  Skin: No rash noted. No erythema.  Psychiatric: He has a normal mood and affect. His behavior is normal.    ED Course  Procedures (including critical care time)  DIAGNOSTIC STUDIES: Oxygen Saturation is 97% on room air, adequate by my interpretation.    COORDINATION OF CARE: 3:13PM-Discussed treatment plan which includes CT scan of  abdomen and blood work with pt at bedside and pt agreed to plan.  3:15PM-Ordered 1,000 mL Bolus  3:30PM-Ordered 1 mg Dilaudid injection and 4 mg Zofran injection  5:30PM-Pt rechecked and feels improved after pain medications. Informed pt of lab and radiology results. Discussed discharge plan which includes pain medications with pt and pt agreed to plan. Advised pt to follow up with Dr. Lovell Sheehan and pt states that he has a pre-scheduled follow up appointment in 2 days.  Labs Reviewed  COMPREHENSIVE METABOLIC PANEL - Abnormal; Notable for the following:    Glucose, Bld 101 (*)     All other components within normal limits  URINALYSIS, ROUTINE W REFLEX MICROSCOPIC - Abnormal; Notable for the following:    Hgb urine dipstick TRACE (*)     All other components within normal limits  CBC WITH DIFFERENTIAL  LIPASE, BLOOD  URINE MICROSCOPIC-ADD ON   Ct Abdomen Pelvis W Contrast  02/25/2012  *RADIOLOGY REPORT*  Clinical Data: Mid abdominal pain.  Nausea and diarrhea.  CT ABDOMEN AND PELVIS WITH CONTRAST  Technique:  Multidetector CT imaging of the abdomen and pelvis was performed following the standard protocol during bolus  administration of intravenous contrast.  Contrast: OMNIPAQUE IOHEXOL 300 MG/ML  SOLN  Comparison: Acute abdominal series 01/21/2012.  CT of the abdomen and pelvis with contrast 10/22/2011.  Findings: The lung bases are clear without focal nodule, mass, or airspace disease.  Heart size is normal.  No significant pleural or pericardial effusion is present.  The liver and spleen are within normal limits.  The stomach, duodenum, and pancreas are unremarkable.  Common bile duct is within normal limits following cholecystectomy.  The adrenal glands and kidneys are normal bilaterally.  The rectosigmoid colon is within normal limits.  Much of the transverse and descending colon is collapsed.  The most proximal colon is within normal limits.  The distal ileum is unremarkable. Surgical clips to suggest appendectomy.  The small bowel is unremarkable.  Multiple ventral periumbilical hernias are stable. This contains fat, but no bowel.  There is some stranding of the fat within the hernia is.  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  IMPRESSION:  1.  Similar appearance of a complex ventral hernia.  There continues to be stranding of the fat within and adjacent to the hernia, suggesting inflammation. 2.  No associated bowel. 3.  Status post cholecystectomy and appendectomy.   Original Report Authenticated By: Jamesetta Orleans. MATTERN, M.D.      No diagnosis found.    MDM        The chart was scribed for me under my direct supervision.  I personally performed the history, physical, and medical decision making and all procedures in the evaluation of this patient.Jeffrey Lennert, MD 02/25/12 [phone]

## 2012-02-25 NOTE — ED Notes (Signed)
Pt c/o abd pain from umbilicus region up.  Pt says acid reflux has also flared up.  Reports has spit up several times.  Denies diarrhea.  LBM was this am.

## 2012-02-25 NOTE — ED Notes (Signed)
MD at bedside. 

## 2012-02-25 NOTE — ED Notes (Signed)
Pt with abd pain, hx of 4 hernias per pt, states with N/V x 7 since last night, pain per pt started last night, LBM this morning and was normal per pt

## 2012-02-27 ENCOUNTER — Encounter (HOSPITAL_COMMUNITY): Payer: Self-pay

## 2012-02-27 NOTE — Patient Instructions (Addendum)
20 [name]  02/27/2012   Your procedure is scheduled on:  03/04/2012  Report to [facility] at  730  AM.  Call this number if you have problems the morning of surgery: [phone]   Remember:   Do not eat food:After Midnight.  May have clear liquids:until Midnight .    Take these medicines the morning of surgery with A SIP OF WATER: prilosec,percocet,phenergan,xanax,lisinopril,topamax,tegretol,celexa. Take proventil before you come.   Do not wear jewelry, make-up or nail polish.  Do not wear lotions, powders, or perfumes. You may wear deodorant.  Do not shave 48 hours prior to surgery. Men may shave face and neck.  Do not bring valuables to the hospital.  Contacts, dentures or bridgework may not be worn into surgery.  Leave suitcase in the car. After surgery it may be brought to your room.  For patients admitted to the hospital, checkout time is 11:00 AM the day of discharge.   Patients discharged the day of surgery will not be allowed to drive home.  Name and phone number of your driver: family  Special Instructions: Shower using CHG 2 nights before surgery and the night before surgery.  If you shower the day of surgery use CHG.  Use special wash - you have one bottle of CHG for all showers.  You should use approximately 1/3 of the bottle for each shower.   Please read over the following fact sheets that you were given: Pain Booklet, Coughing and Deep Breathing, MRSA Information, Surgical Site Infection Prevention, Anesthesia Post-op Instructions and Care and Recovery After Surgery Hernia Repair with Laparoscope A hernia occurs when an internal organ pushes out through a weak spot in the belly (abdominal) wall muscles. Hernias most commonly occur in the groin and around the navel. Hernias can also occur through a cut by the surgeon (incision) after an abdominal operation. A hernia may be caused by:  Lifting heavy objects.  Prolonged coughing.  Straining to move your  bowels. Hernias can often be pushed back into place (reduced). Most hernias tend to get worse over time. Problems occur when abdominal contents get stuck in the opening and the blood supply is blocked or impaired (incarcerated hernia). Because of these risks, you require surgery to repair the hernia. Your hernia will be repaired using a laparoscope. Laparoscopic surgery is a type of minimally invasive surgery. It does not involve making a typical surgical cut (incision) in the skin. A laparoscope is a telescope-like rod and lens system. It is usually connected to a video camera and a light source so your caregiver can clearly see the operative area. The instruments are inserted through  to  inch (5 mm or 10 mm) openings in the skin at specific locations. A working and viewing space is created by blowing a small amount of carbon dioxide gas into the abdominal cavity. The abdomen is essentially blown up like a balloon (insufflated). This elevates the abdominal wall above the internal organs like a dome. The carbon dioxide gas is common to the human body and can be absorbed by tissue and removed by the respiratory system. Once the repair is completed, the small incisions will be closed with either stitches (sutures) or staples (just like a paper stapler only this staple holds the skin together). LET YOUR CAREGIVERS KNOW ABOUT:  Allergies.  Medications taken including herbs, eye drops, over the counter medications, and creams.  Use of steroids (by mouth or creams).  Previous problems with anesthetics or  Novocaine.  Possibility of pregnancy, if this applies.  History of blood clots (thrombophlebitis).  History of bleeding or blood problems.  Previous surgery.  Other health problems. BEFORE THE PROCEDURE  Laparoscopy can be done either in a hospital or out-patient clinic. You may be given a mild sedative to help you relax before the procedure. Once in the operating room, you will be given a  general anesthesia to make you sleep (unless you and your caregiver choose a different anesthetic).  AFTER THE PROCEDURE  After the procedure you will be watched in a recovery area. Depending on what type of hernia was repaired, you might be admitted to the hospital or you might go home the same day. With this procedure you may have less pain and scarring. This usually results in a quicker recovery and less risk of infection. HOME CARE INSTRUCTIONS   Bed rest is not required. You may continue your normal activities but avoid heavy lifting (more than 10 pounds) or straining.  Cough gently. If you are a smoker it is best to stop, as even the best hernia repair can break down with the continual strain of coughing.  Avoid driving until given the OK by your surgeon.  There are no dietary restrictions unless given otherwise.  TAKE ALL MEDICATIONS AS DIRECTED.  Only take over-the-counter or prescription medicines for pain, discomfort, or fever as directed by your caregiver. SEEK MEDICAL CARE IF:   There is increasing abdominal pain or pain in your incisions.  There is more bleeding from incisions, other than minimal spotting.  You feel light headed or faint.  You develop an unexplained fever, chills, and/or an oral temperature above 102 F (38.9 C).  You have redness, swelling, or increasing pain in the wound.  Pus coming from wound.  A foul smell coming from the wound or dressings. SEEK IMMEDIATE MEDICAL CARE IF:   You develop a rash.  You have difficulty breathing.  You have any allergic problems. MAKE SURE YOU:   Understand these instructions.  Will watch your condition.  Will get help right away if you are not doing well or get worse. Document Released: 04/22/2005 Document Revised: 07/15/2011 Document Reviewed: 03/22/2009 [facility] Patient Information 2013 [place], [place]. PATIENT INSTRUCTIONS POST-ANESTHESIA  IMMEDIATELY FOLLOWING SURGERY:  Do not drive or operate  machinery for the first twenty four hours after surgery.  Do not make any important decisions for twenty four hours after surgery or while taking narcotic pain medications or sedatives.  If you develop intractable nausea and vomiting or a severe headache please notify your doctor immediately.  FOLLOW-UP:  Please make an appointment with your surgeon as instructed. You do not need to follow up with anesthesia unless specifically instructed to do so.  WOUND CARE INSTRUCTIONS (if applicable):  Keep a dry clean dressing on the anesthesia/puncture wound site if there is drainage.  Once the wound has quit draining you may leave it open to air.  Generally you should leave the bandage intact for twenty four hours unless there is drainage.  If the epidural site drains for more than 36-48 hours please call the anesthesia department.  QUESTIONS?:  Please feel free to call your physician or the hospital operator if you have any questions, and they will be happy to assist you.

## 2012-02-27 NOTE — H&P (Signed)
[name] is an 31 y.o. male.   Chief Complaint: **recurrent incisional hernia* HPI: **31yo morbidly obese wm who presents for surgery for recurrent incisional hernia.*  Past Medical History  Diagnosis Date  . GERD (gastroesophageal reflux disease)     Bravo study Nov 2009 on Prilosec 40 BID with adequate acid suppression  . Hypertension   . Anxiety   . Depression     increased over past several months  . History of MRSA infection     pt denies  . Chronic abdominal pain     HIDA scan Sept 2010, EF 93%, s/p chole oct 2010; evaluated at [facility] for abdominal wall pain; Imipramine added May 2011  . Chronic vomiting     normal GES 2009  . S/P endoscopy     2008 Dr. Karilyn Cota: erosive reflux esophagitis, antral gastritis, H.pylori serologies neg, Nov 2009 Dr. Darrick Penna: gastritis, benign esophageal polyp, no H.pylori,  Feb 2011: [facility], Dr. Bubba Hales: normal esophagus, normal stomach, normal duodenum, path unremarkable  . Ventral hernia     Past Surgical History  Procedure Date  . Ventral hernia repair March 2012    Dr. Lovell Sheehan  . Knee surgery 2009  . Cholecystectomy 10/10  . Appendectomy 5/10  . Esophagogastroduodenoscopy 05/2006    H.pylori neg  . Inguinal hernia repair     child  . Esophagogastroduodenoscopy 03/23/2008    Fields-gastritis benign esophageal polyp, otherwise normal. Negative H. pylori he (propofol)    Family History  Problem Relation Age of Onset  . Colon cancer Neg Hx    Social History:  reports that he has been smoking Cigarettes.  He has been smoking about .5 packs per day. He has never used smokeless tobacco. He reports that he does not drink alcohol or use illicit drugs.  Allergies:  Allergies  Allergen Reactions  . Dexilant (Dexlansoprazole) Other (See Comments)    Pass out  . Propoxyphene-Acetaminophen Other (See Comments)    Unknown   . Zofran (Ondansetron Hcl) Nausea And Vomiting    No prescriptions prior to admission    Results for orders  placed during the hospital encounter of 02/25/12 (from the past 48 hour(s))  CBC WITH DIFFERENTIAL     Status: Normal   Collection Time   02/25/12  3:15 PM      Component Value Range Comment   WBC 10.0  4.0 - 10.5 K/uL    RBC 4.93  4.22 - 5.81 MIL/uL    Hemoglobin 15.7  13.0 - 17.0 g/dL    HCT 19.1  47.8 - 29.5 %    MCV 93.1  78.0 - 100.0 fL    MCH 31.8  26.0 - 34.0 pg    MCHC 34.2  30.0 - 36.0 g/dL    RDW 62.1  30.8 - 65.7 %    Platelets 236  150 - 400 K/uL    Neutrophils Relative 65  43 - 77 %    Neutro Abs 6.5  1.7 - 7.7 K/uL    Lymphocytes Relative 27  12 - 46 %    Lymphs Abs 2.7  0.7 - 4.0 K/uL    Monocytes Relative 7  3 - 12 %    Monocytes Absolute 0.7  0.1 - 1.0 K/uL    Eosinophils Relative 1  0 - 5 %    Eosinophils Absolute 0.1  0.0 - 0.7 K/uL    Basophils Relative 0  0 - 1 %    Basophils Absolute 0.0  0.0 - 0.1 K/uL  COMPREHENSIVE METABOLIC PANEL     Status: Abnormal   Collection Time   02/25/12  3:15 PM      Component Value Range Comment   Sodium 137  135 - 145 mEq/L    Potassium 3.6  3.5 - 5.1 mEq/L    Chloride 99  96 - 112 mEq/L    CO2 26  19 - 32 mEq/L    Glucose, Bld 101 (*) 70 - 99 mg/dL    BUN 11  6 - 23 mg/dL    Creatinine, Ser 2.95  0.50 - 1.35 mg/dL    Calcium 62.1  8.4 - 10.5 mg/dL    Total Protein 7.7  6.0 - 8.3 g/dL    Albumin 4.5  3.5 - 5.2 g/dL    AST 16  0 - 37 U/L    ALT 19  0 - 53 U/L    Alkaline Phosphatase 88  39 - 117 U/L    Total Bilirubin 0.7  0.3 - 1.2 mg/dL    GFR calc non Af Amer >90  >90 mL/min    GFR calc Af Amer >90  >90 mL/min   LIPASE, BLOOD     Status: Normal   Collection Time   02/25/12  3:15 PM      Component Value Range Comment   Lipase 20  11 - 59 U/L   URINALYSIS, ROUTINE W REFLEX MICROSCOPIC     Status: Abnormal   Collection Time   02/25/12  4:44 PM      Component Value Range Comment   Color, Urine YELLOW  YELLOW    APPearance CLEAR  CLEAR    Specific Gravity, Urine 1.010  1.005 - 1.030    pH 5.5  5.0 - 8.0     Glucose, UA NEGATIVE  NEGATIVE mg/dL    Hgb urine dipstick TRACE (*) NEGATIVE    Bilirubin Urine NEGATIVE  NEGATIVE    Ketones, ur NEGATIVE  NEGATIVE mg/dL    Protein, ur NEGATIVE  NEGATIVE mg/dL    Urobilinogen, UA 0.2  0.0 - 1.0 mg/dL    Nitrite NEGATIVE  NEGATIVE    Leukocytes, UA NEGATIVE  NEGATIVE   URINE MICROSCOPIC-ADD ON     Status: Normal   Collection Time   02/25/12  4:44 PM      Component Value Range Comment   Squamous Epithelial / LPF RARE  RARE    WBC, UA 0-2  <3 WBC/hpf    RBC / HPF 0-2  <3 RBC/hpf    Bacteria, UA RARE  RARE    Ct Abdomen Pelvis W Contrast  02/25/2012  *RADIOLOGY REPORT*  Clinical Data: Mid abdominal pain.  Nausea and diarrhea.  CT ABDOMEN AND PELVIS WITH CONTRAST  Technique:  Multidetector CT imaging of the abdomen and pelvis was performed following the standard protocol during bolus administration of intravenous contrast.  Contrast: OMNIPAQUE IOHEXOL 300 MG/ML  SOLN  Comparison: Acute abdominal series 01/21/2012.  CT of the abdomen and pelvis with contrast 10/22/2011.  Findings: The lung bases are clear without focal nodule, mass, or airspace disease.  Heart size is normal.  No significant pleural or pericardial effusion is present.  The liver and spleen are within normal limits.  The stomach, duodenum, and pancreas are unremarkable.  Common bile duct is within normal limits following cholecystectomy.  The adrenal glands and kidneys are normal bilaterally.  The rectosigmoid colon is within normal limits.  Much of the transverse and descending colon is collapsed.  The most proximal  colon is within normal limits.  The distal ileum is unremarkable. Surgical clips to suggest appendectomy.  The small bowel is unremarkable.  Multiple ventral periumbilical hernias are stable. This contains fat, but no bowel.  There is some stranding of the fat within the hernia is.  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  IMPRESSION:   1.  Similar appearance of a complex ventral hernia.  There continues to be stranding of the fat within and adjacent to the hernia, suggesting inflammation. 2.  No associated bowel. 3.  Status post cholecystectomy and appendectomy.   Original Report Authenticated By: Jamesetta Orleans. MATTERN, M.D.     Review of Systems  Constitutional: Negative.   HENT: Negative.   Eyes: Negative.   Respiratory: Negative.   Cardiovascular: Negative.   Gastrointestinal: Positive for abdominal pain.  Genitourinary: Negative.   Musculoskeletal: Negative.   Skin: Negative.   Endo/Heme/Allergies: Negative.     There were no vitals taken for this visit. Physical Exam  Constitutional: He is oriented to person, place, and time. He appears well-developed and well-nourished.       Obese  HENT:  Head: Normocephalic and atraumatic.  Neck: Normal range of motion. Neck supple.  Cardiovascular: Normal rate, regular rhythm and normal heart sounds.   Respiratory: Effort normal and breath sounds normal.  GI: Soft. Bowel sounds are normal.       Incisional hernia present along midline and superior to umbilicus.  Easily reducible.  Musculoskeletal: Normal range of motion.  Neurological: He is alert and oriented to person, place, and time.  Skin: Skin is warm and dry.     Assessment/Plan *Imp:  Recurrent incisional hernia Plan:  Scheduled for recurrent laparoscopic incisional herniorrhaphy on 03/04/12.  Risks and benefits of procedure including bleeding, infection, the possibility of an open procedure, and the possibility of recurrence of the hernia were fully explained to the patient, who gives informed consent.**  Ronna Herskowitz A 02/27/2012, 1:56 PM

## 2012-02-28 ENCOUNTER — Encounter (HOSPITAL_COMMUNITY): Payer: Self-pay

## 2012-02-28 ENCOUNTER — Encounter (HOSPITAL_COMMUNITY)
Admission: RE | Admit: 2012-02-28 | Discharge: 2012-02-28 | Disposition: A | Discharge status: Home / Self Care | Payer: Medicaid Other | Source: Ambulatory Visit | Attending: General Surgery | Admitting: General Surgery

## 2012-02-28 HISTORY — DX: Shortness of breath: R06.02

## 2012-02-28 HISTORY — DX: Sleep apnea, unspecified: G47.30

## 2012-02-28 LAB — SURGICAL PCR SCREEN
MRSA, PCR: NEGATIVE
Staphylococcus aureus: NEGATIVE

## 2012-02-28 MED ORDER — CHLORHEXIDINE GLUCONATE 4 % EX LIQD: 1.0000 "application " | Freq: Once | CUTANEOUS | Status: DC

## 2012-03-04 ENCOUNTER — Encounter (HOSPITAL_COMMUNITY): Payer: Self-pay | Admitting: Anesthesiology

## 2012-03-04 ENCOUNTER — Ambulatory Visit (HOSPITAL_COMMUNITY): Payer: Medicaid Other | Admitting: Anesthesiology

## 2012-03-04 ENCOUNTER — Encounter (HOSPITAL_COMMUNITY): Payer: Self-pay | Admitting: *Deleted

## 2012-03-04 ENCOUNTER — Encounter (HOSPITAL_COMMUNITY)
Admission: RE | Disposition: A | Discharge status: Home / Self Care | Payer: Self-pay | Source: Ambulatory Visit | Attending: General Surgery

## 2012-03-04 ENCOUNTER — Ambulatory Visit (HOSPITAL_COMMUNITY)
Admission: RE | Admit: 2012-03-04 | Discharge: 2012-03-05 | Disposition: A | Discharge status: Home / Self Care | Payer: Medicaid Other | Source: Ambulatory Visit | Attending: General Surgery | Admitting: General Surgery

## 2012-03-04 DIAGNOSIS — Z23 Encounter for immunization: Secondary | ICD-10-CM | POA: Insufficient documentation

## 2012-03-04 DIAGNOSIS — K432 Incisional hernia without obstruction or gangrene: Secondary | ICD-10-CM | POA: Insufficient documentation

## 2012-03-04 DIAGNOSIS — Z01812 Encounter for preprocedural laboratory examination: Secondary | ICD-10-CM | POA: Insufficient documentation

## 2012-03-04 DIAGNOSIS — I10 Essential (primary) hypertension: Secondary | ICD-10-CM | POA: Insufficient documentation

## 2012-03-04 HISTORY — PX: INCISIONAL HERNIA REPAIR: SHX193

## 2012-03-04 HISTORY — PX: INSERTION OF MESH: SHX5868

## 2012-03-04 SURGERY — REPAIR, HERNIA, INCISIONAL, LAPAROSCOPIC
Anesthesia: General | Site: Abdomen | Wound class: Clean

## 2012-03-04 MED ORDER — ALPRAZOLAM 1 MG PO TABS
1.0000 mg | ORAL_TABLET | Freq: Four times a day (QID) | ORAL | Status: DC
  Administered 2012-03-04 – 2012-03-05 (×4): 1 mg via ORAL
  Filled 2012-03-04 (×4): qty 1

## 2012-03-04 MED ORDER — SUCCINYLCHOLINE CHLORIDE 20 MG/ML IJ SOLN
INTRAMUSCULAR | Status: AC
  Filled 2012-03-04: qty 1

## 2012-03-04 MED ORDER — PROPOFOL 10 MG/ML IV BOLUS
INTRAVENOUS | Status: DC | PRN
  Administered 2012-03-04: 200 mg via INTRAVENOUS

## 2012-03-04 MED ORDER — ACETAMINOPHEN 10 MG/ML IV SOLN
1000.0000 mg | Freq: Four times a day (QID) | INTRAVENOUS | Status: AC
  Administered 2012-03-04 – 2012-03-05 (×4): 1000 mg via INTRAVENOUS
  Filled 2012-03-04 (×5): qty 100

## 2012-03-04 MED ORDER — ALBUTEROL SULFATE (5 MG/ML) 0.5% IN NEBU
2.5000 mg | INHALATION_SOLUTION | Freq: Three times a day (TID) | RESPIRATORY_TRACT | Status: DC
  Administered 2012-03-04 – 2012-03-05 (×3): 2.5 mg via RESPIRATORY_TRACT
  Filled 2012-03-04 (×2): qty 0.5

## 2012-03-04 MED ORDER — MIDAZOLAM HCL 2 MG/2ML IJ SOLN
1.0000 mg | INTRAMUSCULAR | Status: DC | PRN
  Administered 2012-03-04 (×2): 2 mg via INTRAVENOUS

## 2012-03-04 MED ORDER — PNEUMOCOCCAL VAC POLYVALENT 25 MCG/0.5ML IJ INJ
0.5000 mL | INJECTION | INTRAMUSCULAR | Status: AC
  Administered 2012-03-05: 0.5 mL via INTRAMUSCULAR
  Filled 2012-03-04: qty 0.5

## 2012-03-04 MED ORDER — PROPOFOL 10 MG/ML IV EMUL
INTRAVENOUS | Status: AC
  Filled 2012-03-04: qty 20

## 2012-03-04 MED ORDER — MIDAZOLAM HCL 2 MG/2ML IJ SOLN
INTRAMUSCULAR | Status: AC
  Filled 2012-03-04: qty 2

## 2012-03-04 MED ORDER — FENTANYL CITRATE 0.05 MG/ML IJ SOLN
INTRAMUSCULAR | Status: AC
  Filled 2012-03-04: qty 2

## 2012-03-04 MED ORDER — FENTANYL CITRATE 0.05 MG/ML IJ SOLN
INTRAMUSCULAR | Status: DC | PRN
  Administered 2012-03-04 (×3): 50 ug via INTRAVENOUS
  Administered 2012-03-04: 100 ug via INTRAVENOUS
  Administered 2012-03-04 (×4): 50 ug via INTRAVENOUS

## 2012-03-04 MED ORDER — ALBUTEROL SULFATE (5 MG/ML) 0.5% IN NEBU
2.5000 mg | INHALATION_SOLUTION | RESPIRATORY_TRACT | Status: DC | PRN
  Administered 2012-03-04: 2.5 mg via RESPIRATORY_TRACT
  Filled 2012-03-04: qty 0.5

## 2012-03-04 MED ORDER — LIDOCAINE HCL 1 % IJ SOLN
INTRAMUSCULAR | Status: DC | PRN
  Administered 2012-03-04: 50 mg via INTRADERMAL

## 2012-03-04 MED ORDER — GLYCOPYRROLATE 0.2 MG/ML IJ SOLN
INTRAMUSCULAR | Status: DC | PRN
  Administered 2012-03-04: .8 mg via INTRAVENOUS

## 2012-03-04 MED ORDER — SODIUM CHLORIDE 0.9 % IJ SOLN
INTRAMUSCULAR | Status: AC
  Administered 2012-03-04: 3 mL
  Filled 2012-03-04: qty 3

## 2012-03-04 MED ORDER — FENTANYL CITRATE 0.05 MG/ML IJ SOLN
INTRAMUSCULAR | Status: AC
  Filled 2012-03-04: qty 5

## 2012-03-04 MED ORDER — ROCURONIUM BROMIDE 100 MG/10ML IV SOLN
INTRAVENOUS | Status: DC | PRN
  Administered 2012-03-04 (×2): 10 mg via INTRAVENOUS
  Administered 2012-03-04: 30 mg via INTRAVENOUS
  Administered 2012-03-04 (×3): 10 mg via INTRAVENOUS
  Administered 2012-03-04: 20 mg via INTRAVENOUS

## 2012-03-04 MED ORDER — LISINOPRIL 10 MG PO TABS
20.0000 mg | ORAL_TABLET | Freq: Every day | ORAL | Status: DC
  Administered 2012-03-04 – 2012-03-05 (×2): 20 mg via ORAL
  Filled 2012-03-04 (×2): qty 2

## 2012-03-04 MED ORDER — ALBUTEROL SULFATE HFA 108 (90 BASE) MCG/ACT IN AERS
INHALATION_SPRAY | RESPIRATORY_TRACT | Status: AC
  Filled 2012-03-04: qty 6.7

## 2012-03-04 MED ORDER — CARBAMAZEPINE ER 100 MG PO TB12
100.0000 mg | ORAL_TABLET | Freq: Four times a day (QID) | ORAL | Status: DC
  Administered 2012-03-04 – 2012-03-05 (×4): 100 mg via ORAL
  Filled 2012-03-04 (×9): qty 1

## 2012-03-04 MED ORDER — ONDANSETRON HCL 4 MG/2ML IJ SOLN
4.0000 mg | Freq: Four times a day (QID) | INTRAMUSCULAR | Status: DC | PRN
  Administered 2012-03-04 – 2012-03-05 (×3): 4 mg via INTRAVENOUS
  Filled 2012-03-04 (×3): qty 2

## 2012-03-04 MED ORDER — GLYCOPYRROLATE 0.2 MG/ML IJ SOLN
INTRAMUSCULAR | Status: AC
  Filled 2012-03-04: qty 4

## 2012-03-04 MED ORDER — HYDROMORPHONE HCL PF 1 MG/ML IJ SOLN
1.5000 mg | INTRAMUSCULAR | Status: DC | PRN
  Administered 2012-03-04 – 2012-03-05 (×8): 1.5 mg via INTRAVENOUS
  Filled 2012-03-04 (×8): qty 2

## 2012-03-04 MED ORDER — ALBUTEROL SULFATE HFA 108 (90 BASE) MCG/ACT IN AERS
INHALATION_SPRAY | RESPIRATORY_TRACT | Status: DC | PRN
  Administered 2012-03-04: 4 via RESPIRATORY_TRACT

## 2012-03-04 MED ORDER — LIDOCAINE HCL (PF) 1 % IJ SOLN
INTRAMUSCULAR | Status: AC
  Filled 2012-03-04: qty 5

## 2012-03-04 MED ORDER — ONDANSETRON HCL 4 MG PO TABS: 4.0000 mg | ORAL_TABLET | Freq: Four times a day (QID) | ORAL | Status: DC | PRN

## 2012-03-04 MED ORDER — ENOXAPARIN SODIUM 40 MG/0.4ML ~~LOC~~ SOLN
40.0000 mg | SUBCUTANEOUS | Status: DC
  Administered 2012-03-05: 40 mg via SUBCUTANEOUS
  Filled 2012-03-04: qty 0.4

## 2012-03-04 MED ORDER — ALBUTEROL SULFATE (5 MG/ML) 0.5% IN NEBU
INHALATION_SOLUTION | RESPIRATORY_TRACT | Status: AC
  Administered 2012-03-04: 2.5 mg via RESPIRATORY_TRACT
  Filled 2012-03-04: qty 0.5

## 2012-03-04 MED ORDER — ENOXAPARIN SODIUM 40 MG/0.4ML ~~LOC~~ SOLN
40.0000 mg | Freq: Once | SUBCUTANEOUS | Status: AC
  Administered 2012-03-04: 40 mg via SUBCUTANEOUS

## 2012-03-04 MED ORDER — ROCURONIUM BROMIDE 50 MG/5ML IV SOLN
INTRAVENOUS | Status: AC
  Filled 2012-03-04: qty 2

## 2012-03-04 MED ORDER — INFLUENZA VIRUS VACC SPLIT PF IM SUSP
0.5000 mL | INTRAMUSCULAR | Status: AC
  Administered 2012-03-05: 0.5 mL via INTRAMUSCULAR
  Filled 2012-03-04: qty 0.5

## 2012-03-04 MED ORDER — HYDROCHLOROTHIAZIDE 25 MG PO TABS
25.0000 mg | ORAL_TABLET | Freq: Every day | ORAL | Status: DC
  Administered 2012-03-04 – 2012-03-05 (×2): 25 mg via ORAL
  Filled 2012-03-04 (×2): qty 1

## 2012-03-04 MED ORDER — FLUMAZENIL 0.5 MG/5ML IV SOLN
INTRAVENOUS | Status: DC | PRN
  Administered 2012-03-04: 4 mg via INTRAVENOUS

## 2012-03-04 MED ORDER — SUCCINYLCHOLINE CHLORIDE 20 MG/ML IJ SOLN
INTRAMUSCULAR | Status: DC | PRN
  Administered 2012-03-04: 140 mg via INTRAVENOUS

## 2012-03-04 MED ORDER — ALBUTEROL SULFATE HFA 108 (90 BASE) MCG/ACT IN AERS: 2.0000 | INHALATION_SPRAY | Freq: Four times a day (QID) | RESPIRATORY_TRACT | Status: DC | PRN

## 2012-03-04 MED ORDER — LISINOPRIL-HYDROCHLOROTHIAZIDE 20-25 MG PO TABS: 1.0000 | ORAL_TABLET | Freq: Every day | ORAL | Status: DC

## 2012-03-04 MED ORDER — ROCURONIUM BROMIDE 50 MG/5ML IV SOLN
INTRAVENOUS | Status: AC
  Filled 2012-03-04: qty 1

## 2012-03-04 MED ORDER — ENOXAPARIN SODIUM 40 MG/0.4ML ~~LOC~~ SOLN
SUBCUTANEOUS | Status: AC
  Filled 2012-03-04: qty 0.4

## 2012-03-04 MED ORDER — LACTATED RINGERS IV SOLN
INTRAVENOUS | Status: DC
  Administered 2012-03-04 (×2): via INTRAVENOUS

## 2012-03-04 MED ORDER — TOPIRAMATE 100 MG PO TABS
100.0000 mg | ORAL_TABLET | Freq: Two times a day (BID) | ORAL | Status: DC
  Administered 2012-03-04 – 2012-03-05 (×2): 100 mg via ORAL
  Filled 2012-03-04 (×5): qty 1

## 2012-03-04 MED ORDER — 0.9 % SODIUM CHLORIDE (POUR BTL) OPTIME
TOPICAL | Status: DC | PRN
  Administered 2012-03-04: 1000 mL

## 2012-03-04 MED ORDER — ONDANSETRON HCL 4 MG/2ML IJ SOLN: 4.0000 mg | Freq: Once | INTRAMUSCULAR | Status: DC | PRN

## 2012-03-04 MED ORDER — BUPIVACAINE HCL (PF) 0.5 % IJ SOLN
INTRAMUSCULAR | Status: DC | PRN
  Administered 2012-03-04: 10 mL

## 2012-03-04 MED ORDER — TRAZODONE HCL 50 MG PO TABS: 100.0000 mg | ORAL_TABLET | Freq: Two times a day (BID) | ORAL | Status: DC | PRN

## 2012-03-04 MED ORDER — VANCOMYCIN HCL 1000 MG IV SOLR
1500.0000 mg | INTRAVENOUS | Status: AC
  Administered 2012-03-04: 1500 mg via INTRAVENOUS
  Filled 2012-03-04: qty 1500

## 2012-03-04 MED ORDER — HYDROMORPHONE HCL PF 1 MG/ML IJ SOLN
1.0000 mg | INTRAMUSCULAR | Status: DC | PRN
  Administered 2012-03-04: 1 mg via INTRAVENOUS
  Filled 2012-03-04: qty 1

## 2012-03-04 MED ORDER — CITALOPRAM HYDROBROMIDE 20 MG PO TABS
40.0000 mg | ORAL_TABLET | Freq: Every day | ORAL | Status: DC
  Administered 2012-03-04 – 2012-03-05 (×2): 40 mg via ORAL
  Filled 2012-03-04 (×2): qty 2

## 2012-03-04 MED ORDER — LACTATED RINGERS IV SOLN
INTRAVENOUS | Status: DC
  Administered 2012-03-04 – 2012-03-05 (×2): via INTRAVENOUS

## 2012-03-04 MED ORDER — DOCUSATE SODIUM 100 MG PO CAPS
100.0000 mg | ORAL_CAPSULE | Freq: Every day | ORAL | Status: DC
Start: 2012-03-04 — End: 2012-03-05
  Administered 2012-03-04 – 2012-03-05 (×2): 100 mg via ORAL
  Filled 2012-03-04 (×2): qty 1

## 2012-03-04 MED ORDER — BUPIVACAINE HCL (PF) 0.5 % IJ SOLN
INTRAMUSCULAR | Status: AC
  Filled 2012-03-04: qty 30

## 2012-03-04 MED ORDER — SODIUM CHLORIDE 0.9 % IJ SOLN
INTRAMUSCULAR | Status: AC
  Administered 2012-03-04: 10 mL
  Filled 2012-03-04: qty 3

## 2012-03-04 MED ORDER — FENTANYL CITRATE 0.05 MG/ML IJ SOLN
25.0000 ug | INTRAMUSCULAR | Status: DC | PRN
  Administered 2012-03-04 (×4): 50 ug via INTRAVENOUS

## 2012-03-04 SURGICAL SUPPLY — 45 items
ADH SKN CLS APL DERMABOND .7 (GAUZE/BANDAGES/DRESSINGS) ×1
BAG HAMPER (MISCELLANEOUS) ×2 IMPLANT
BINDER ABD UNIV 9 30-45 (GAUZE/BANDAGES/DRESSINGS) IMPLANT
BINDER ABDOMINAL 9 (GAUZE/BANDAGES/DRESSINGS) ×2
CLOTH BEACON ORANGE TIMEOUT ST (SAFETY) ×2 IMPLANT
COVER LIGHT HANDLE STERIS (MISCELLANEOUS) ×4 IMPLANT
DECANTER SPIKE VIAL GLASS SM (MISCELLANEOUS) ×2 IMPLANT
DERMABOND ADVANCED (GAUZE/BANDAGES/DRESSINGS) ×1
DERMABOND ADVANCED .7 DNX12 (GAUZE/BANDAGES/DRESSINGS) ×1 IMPLANT
DEVICE TROCAR PUNCTURE CLOSURE (ENDOMECHANICALS) ×2 IMPLANT
DURAPREP 26ML APPLICATOR (WOUND CARE) ×2 IMPLANT
ELECT REM PT RETURN 9FT ADLT (ELECTROSURGICAL) ×2
ELECTRODE REM PT RTRN 9FT ADLT (ELECTROSURGICAL) ×1 IMPLANT
FILTER SMOKE EVAC LAPAROSHD (FILTER) ×2 IMPLANT
GLOVE BIO SURGEON STRL SZ7.5 (GLOVE) ×2 IMPLANT
GLOVE BIOGEL PI IND STRL 7.0 (GLOVE) IMPLANT
GLOVE BIOGEL PI INDICATOR 7.0 (GLOVE) ×4
GLOVE EUDERMIC 7 POWDERFREE (GLOVE) ×1 IMPLANT
GLOVE SS BIOGEL STRL SZ 6.5 (GLOVE) IMPLANT
GLOVE SUPERSENSE BIOGEL SZ 6.5 (GLOVE) ×2
GOWN STRL REIN XL XLG (GOWN DISPOSABLE) ×6 IMPLANT
INST SET LAPROSCOPIC AP (KITS) ×2 IMPLANT
KIT ROOM TURNOVER APOR (KITS) ×2 IMPLANT
LIGASURE LAP ATLAS 10MM 37CM (INSTRUMENTS) ×2 IMPLANT
MANIFOLD NEPTUNE II (INSTRUMENTS) ×2 IMPLANT
MESH PHYSIO OVAL 15X20CM (Mesh General) ×1 IMPLANT
NDL INSUFFLATION 14GA 120MM (NEEDLE) ×1 IMPLANT
NEEDLE INSUFFLATION 14GA 120MM (NEEDLE) ×2 IMPLANT
NS IRRIG 1000ML POUR BTL (IV SOLUTION) ×2 IMPLANT
PACK LAP CHOLE LZT030E (CUSTOM PROCEDURE TRAY) ×2 IMPLANT
PAD ARMBOARD 7.5X6 YLW CONV (MISCELLANEOUS) ×2 IMPLANT
SET BASIN LINEN APH (SET/KITS/TRAYS/PACK) ×2 IMPLANT
SPONGE GAUZE 2X2 8PLY STRL LF (GAUZE/BANDAGES/DRESSINGS) ×4 IMPLANT
STAPLER VISISTAT (STAPLE) ×2 IMPLANT
SUT PROLENE 0 CT 1 CR/8 (SUTURE) ×2 IMPLANT
SUT VICRYL 0 UR6 27IN ABS (SUTURE) ×1 IMPLANT
TACKER 5MM HERNIA 3.5CML NAB (ENDOMECHANICALS) ×3 IMPLANT
TAPE CLOTH SURG 4X10 WHT LF (GAUZE/BANDAGES/DRESSINGS) ×1 IMPLANT
TOWEL OR 17X26 4PK STRL BLUE (TOWEL DISPOSABLE) ×2 IMPLANT
TRAY FOLEY CATH 14FR (SET/KITS/TRAYS/PACK) ×2 IMPLANT
TROCAR Z-THRD FIOS HNDL 11X100 (TROCAR) ×2 IMPLANT
TROCAR Z-THREAD FIOS 5X100MM (TROCAR) ×2 IMPLANT
TROCAR Z-THREAD SLEEVE 11X100 (TROCAR) ×4 IMPLANT
TUBING HI FLO HEAT INSUFFLATOR (IRRIGATION / IRRIGATOR) ×2 IMPLANT
WARMER LAPAROSCOPE (MISCELLANEOUS) ×2 IMPLANT

## 2012-03-04 NOTE — Plan of Care (Signed)
Problem: Phase I Progression Outcomes Goal: Sutures/staples intact Outcome: Completed/Met Date Met:  03/04/12 Steri strips in place

## 2012-03-04 NOTE — Interval H&P Note (Signed)
History and Physical Interval Note:  03/04/2012 7:32 AM  [name]  has presented today for surgery, with the diagnosis of incisional Hernia  The various methods of treatment have been discussed with the patient and family. After consideration of risks, benefits and other options for treatment, the patient has consented to  Procedure(s) (LRB) with comments: LAPAROSCOPIC INCISIONAL HERNIA (N/A) as a surgical intervention .  The patient's history has been reviewed, patient examined, no change in status, stable for surgery.  I have reviewed the patient's chart and labs.  Questions were answered to the patient's satisfaction.     Franky Macho A

## 2012-03-04 NOTE — Anesthesia Preprocedure Evaluation (Signed)
Anesthesia Evaluation  Patient identified by MRN, date of birth, ID band Patient awake    Reviewed: Allergy & Precautions, H&P , NPO status , Patient's Chart, lab work & pertinent test results  History of Anesthesia Complications Negative for: history of anesthetic complications  Airway Mallampati: I      Dental  (+) Teeth Intact   Pulmonary shortness of breath and with exertion, sleep apnea , COPDCurrent Smoker,  breath sounds clear to auscultation        Cardiovascular hypertension, Pt. on medications Rhythm:Regular Rate:Normal     Neuro/Psych PSYCHIATRIC DISORDERS Anxiety Depression    GI/Hepatic GERD-  Medicated,(+)     substance abuse  alcohol use,   Endo/Other    Renal/GU      Musculoskeletal   Abdominal   Peds  Hematology   Anesthesia Other Findings   Reproductive/Obstetrics                           Anesthesia Physical Anesthesia Plan  ASA: III  Anesthesia Plan: General   Post-op Pain Management:    Induction: Intravenous, Rapid sequence and Cricoid pressure planned  Airway Management Planned: Oral ETT  Additional Equipment:   Intra-op Plan:   Post-operative Plan: Extubation in OR  Informed Consent: I have reviewed the patients History and Physical, chart, labs and discussed the procedure including the risks, benefits and alternatives for the proposed anesthesia with the patient or authorized representative who has indicated his/her understanding and acceptance.     Plan Discussed with: CRNA  Anesthesia Plan Comments:         Anesthesia Quick Evaluation

## 2012-03-04 NOTE — Anesthesia Procedure Notes (Signed)
Procedure Name: Intubation Date/Time: 03/04/2012 9:32 AM Performed by: Glynn Octave E Pre-anesthesia Checklist: Patient identified, Patient being monitored, Timeout performed, Emergency Drugs available and Suction available Patient Re-evaluated:Patient Re-evaluated prior to inductionOxygen Delivery Method: Circle System Utilized Preoxygenation: Pre-oxygenation with 100% oxygen Intubation Type: IV induction, Rapid sequence and Cricoid Pressure applied Ventilation: Mask ventilation without difficulty Laryngoscope Size: Mac and 3 Grade View: Grade I Tube type: Oral Tube size: 8.0 mm Number of attempts: 1 Airway Equipment and Method: stylet Placement Confirmation: ETT inserted through vocal cords under direct vision,  positive ETCO2 and breath sounds checked- equal and bilateral Secured at: 21 cm Tube secured with: Tape Dental Injury: Teeth and Oropharynx as per pre-operative assessment

## 2012-03-04 NOTE — Anesthesia Postprocedure Evaluation (Addendum)
  Anesthesia Post-op Note  Patient: [name]  Procedure(s) Performed: Procedure(s) (LRB) with comments: LAPAROSCOPIC INCISIONAL HERNIA (N/A) - repair, recurrent INSERTION OF MESH (N/A)  Patient Location: PACU  Anesthesia Type:General  Level of Consciousness: awake, alert  and oriented  Airway and Oxygen Therapy: Patient Spontanous Breathing and Patient connected to face mask oxygen  Post-op Pain: moderate  Post-op Assessment: Post-op Vital signs reviewed, Patient's Cardiovascular Status Stable, Respiratory Function Stable, Patent Airway and No signs of Nausea or vomiting  Post-op Vital Signs: Reviewed and stable  Complications: No apparent anesthesia complications 03/05/12  Pt. Discharged today.

## 2012-03-04 NOTE — Op Note (Signed)
Patient:  [name]  DOB:  10-28-1980  MRN:  [id number]   Preop Diagnosis:  Recurrent incisional hernia  Postop Diagnosis:  Same  Procedure:  Laparoscopic incisional herniorrhaphy with mesh, recurrent  Surgeon:  Franky Macho, M.D.  Anes:  General endotracheal  Indications:  Patient is a 31 year old white male who presents with a recurrent incisional hernia. The risks and benefits of the procedure including bleeding, infection, and a possibly recurrence of the hernia were fully explained to the patient, gave informed consent.  Procedure note:  Patient is placed the supine position. After induction of general endotracheal anesthesia, the abdomen was prepped and draped using the usual sterile technique with DuraPrep. Surgical site confirmation was performed.  A Veress needle was inserted into the left upper corner of the abdominal cavity and confirmation of placement was done using the saline drop test. The abdomen was then insufflated to 16 mm mercury pressure. An 11 mm trocar was introduced into the left upper quadrant under direct visualization without difficulty. An additional 11 mm trocar was placed in left lower quadrant. Another one was placed the right lower quadrant and a 5 mm trocar was placed the right upper quadrant regions. The ventral Dahmer wall was inspected and there were some areas of omental adhesions into hernia sacs. These appeared along the midline of the abdominal wall in the supraumbilical region. All omentum was reduced using the LigaSure. Once all the omentum was reduced, a 15 x 20 cm proceed mesh was then inserted. It was tacked to the abdominal wall using both 0 Prolene tacking sutures as well as pro-tack titanium screws in a 2 layer circumferential way. Greater than 2 cm of mesh was noted around the circumference of the hernia defect. All fluid and air were then evacuated from the abdominal cavity prior to removal of the trochars.  All wounds were irrigated with  normal saline. All wounds were injected with 0.5% Sensorcaine. All the 11 millimeter trocar site fascia incision sites were closed using 0 Vicryl interrupted sutures. All skin incisions were closed using staples. Betadine ointment after dressings were applied.  All tape and needle counts were correct at the end of the procedure. Patient was extubated in the operating room went back to recovery room awake in stable condition.   Complications:  None  EBL:  Minimal  Specimen:  None

## 2012-03-04 NOTE — Progress Notes (Signed)
03/04/12 1454 Patient c/o abdominal pain this afternoon on arrival to room, rates pain 10/10. Given dilaudid 1 mg IV as ordered PRN for pain, discussed post-op pain with patient and that some pain/discomfort with movement, post-op is expected. Encouraged to splint abdomen with pillow for any coughing, sneezing, or moving. Stated understood. Discussed tylenol IV has also been ordered for pain management. Patient requested dilaudid dose be increased, due to "pain eased off some, but still hurts". Patient and father requested dr Lovell Sheehan be notified of pain despite dilaudid as ordered. Notified Dr Lovell Sheehan of concerns, stated would see patient. Pt appears less anxious regarding pain after speaking with Dr Lovell Sheehan, will monitor. Earnstine Regal, RN

## 2012-03-04 NOTE — Transfer of Care (Signed)
Immediate Anesthesia Transfer of Care Note  Patient: [name]  Procedure(s) Performed: Procedure(s) (LRB) with comments: LAPAROSCOPIC INCISIONAL HERNIA (N/A) - repair, recurrent INSERTION OF MESH (N/A)  Patient Location: PACU  Anesthesia Type:General  Level of Consciousness: awake, alert  and oriented  Airway & Oxygen Therapy: Patient Spontanous Breathing and Patient connected to face mask oxygen  Post-op Assessment: Report given to PACU RN  Post vital signs: Reviewed and stable  Complications: No apparent anesthesia complications

## 2012-03-05 ENCOUNTER — Encounter (HOSPITAL_COMMUNITY): Payer: Self-pay | Admitting: General Surgery

## 2012-03-05 LAB — CBC
HCT: 43.4 % (ref 39.0–52.0)
Hemoglobin: 14.2 g/dL (ref 13.0–17.0)
MCH: 31.3 pg (ref 26.0–34.0)
MCV: 95.6 fL (ref 78.0–100.0)
Platelets: 221 10*3/uL (ref 150–400)

## 2012-03-05 LAB — BASIC METABOLIC PANEL
CO2: 31 mEq/L (ref 19–32)
Chloride: 96 mEq/L (ref 96–112)
Sodium: 136 mEq/L (ref 135–145)

## 2012-03-05 MED ORDER — OXYCODONE-ACETAMINOPHEN 7.5-325 MG PO TABS: 1.0000 | ORAL_TABLET | ORAL | Status: DC | PRN

## 2012-03-05 NOTE — Discharge Summary (Signed)
Physician Discharge Summary  Patient ID: ZI NEWBURY MRN: [id number] DOB/AGE: Feb 19, 1981 31 y.o.  Admit date: 03/04/2012 Discharge date: 03/05/2012  Admission Diagnoses: Recurrent incisional hernia  Discharge Diagnoses: Same Active Problems:  * No active hospital problems. *    Discharged Condition: good  Hospital Course: Patient is a 31 year old white male morbidly obese who presented for repair of his recurrent incisional hernia. He underwent a laparoscopic incisional herniorrhaphy with mesh on 03/04/2012. He tolerated the procedure well. His postoperative course was remarkable only for pain management issues. His incisions are healing well. He is being discharged home on 03/05/2012 in good and improving condition.  Treatments: surgery: Laparoscopic incisional herniorrhaphy with mesh, recurrent on 03/04/2012  Discharge Exam: Blood pressure 127/78, pulse 66, temperature 97.8 F (36.6 C), temperature source Oral, resp. rate 20, SpO2 96.00%. General appearance: alert, cooperative and no distress Resp: clear to auscultation bilaterally Cardio: regular rate and rhythm, S1, S2 normal, no murmur, click, rub or gallop GI: Soft. Dressings dry and intact. Not significantly distended. Bowel sounds appreciated.  Disposition: 01-Home or Self Care     Medication List     As of 03/05/2012  8:41 AM    TAKE these medications         albuterol 108 (90 BASE) MCG/ACT inhaler   Commonly known as: PROVENTIL HFA;VENTOLIN HFA   Inhale 2 puffs into the lungs every 6 (six) hours as needed. Shortness of breath      ALPRAZolam 1 MG tablet   Commonly known as: XANAX   Take 1 mg by mouth 4 (four) times daily.      carbamazepine 100 MG 12 hr tablet   Commonly known as: TEGRETOL XR   Take 100 mg by mouth 4 (four) times daily.      citalopram 40 MG tablet   Commonly known as: CELEXA   Take 40 mg by mouth daily.      diphenhydrAMINE 25 MG tablet   Commonly known as: BENADRYL   Take 25 mg  by mouth every 6 (six) hours as needed. For allergies      lisinopril-hydrochlorothiazide 20-25 MG per tablet   Commonly known as: PRINZIDE,ZESTORETIC   Take 1 tablet by mouth daily.      omeprazole 40 MG capsule   Commonly known as: PRILOSEC   Take 1 capsule (40 mg total) by mouth 2 (two) times daily.      oxyCODONE-acetaminophen 5-325 MG per tablet   Commonly known as: PERCOCET/ROXICET   Take 1 tablet by mouth every 6 (six) hours as needed for pain.      oxyCODONE-acetaminophen 7.5-325 MG per tablet   Commonly known as: PERCOCET   Take 1-2 tablets by mouth every 4 (four) hours as needed for pain. Pain      promethazine 25 MG tablet   Commonly known as: PHENERGAN   Take 1 tablet (25 mg total) by mouth every 6 (six) hours as needed for nausea.      sucralfate 1 G tablet   Commonly known as: CARAFATE   Take 1 g by mouth 4 (four) times daily. *DISSOLVE ONE TABLET IN WATER AND DRINK FOR 14 DAYS* FILLED January 29, 2012      topiramate 100 MG tablet   Commonly known as: TOPAMAX   Take 100 mg by mouth 2 (two) times daily.      traZODone 100 MG tablet   Commonly known as: DESYREL   Take 100 mg by mouth 3 times/day as needed-between meals & bedtime. For  sleep           Follow-up Information    Follow up with Dalia Heading, MD. Schedule an appointment as soon as possible for a visit on 03/17/2012.   Contact information:   [address] [place] [address] [phone]          Signed: Franky Macho A 03/05/2012, 8:41 AM

## 2012-03-05 NOTE — Addendum Note (Signed)
Addendum  created 03/05/12 1443 by Moshe Salisbury, CRNA   Modules edited:Notes Section

## 2012-03-05 NOTE — Progress Notes (Signed)
03/05/12 1144 Patient discharged home this morning with father. Reviewed discharge instructions with patient, given copy of instructions, medication list, prescriptions, f/u appointment set up with Dr Lovell Sheehan. Reviewed post-op incision care, pt has been using incentive spirometer, deep breathing as discussed previously. Okay for patient to shower tomorrow and eat regular diet per Dr Lovell Sheehan. Reviewed medication list and pt aware medications already received today. Pt verbalized understanding. Pt had refused flu and pneumonia vaccines this morning but changed his mind and stated would like to receive both before discharge. Flu and pneumonia vaccines administered as ordered, see MAR. Tolerated well with no complaints. IV site d/c'd per nurse tech, within normal limits. Pt left floor in stable condition via w/c accompanied by nurse tech. Earnstine Regal ,RN

## 2012-03-05 NOTE — Progress Notes (Signed)
UR Chart Review Completed-- OIB 

## 2012-03-08 ENCOUNTER — Emergency Department (HOSPITAL_COMMUNITY): Payer: Medicaid Other

## 2012-03-08 ENCOUNTER — Encounter (HOSPITAL_COMMUNITY): Payer: Self-pay

## 2012-03-08 ENCOUNTER — Observation Stay (HOSPITAL_COMMUNITY)
Admission: EM | Admit: 2012-03-08 | Discharge: 2012-03-10 | Disposition: A | Discharge status: Home / Self Care | Payer: Medicaid Other | Attending: General Surgery | Admitting: General Surgery

## 2012-03-08 DIAGNOSIS — R109 Unspecified abdominal pain: Secondary | ICD-10-CM | POA: Insufficient documentation

## 2012-03-08 DIAGNOSIS — R61 Generalized hyperhidrosis: Secondary | ICD-10-CM | POA: Insufficient documentation

## 2012-03-08 DIAGNOSIS — Z9889 Other specified postprocedural states: Secondary | ICD-10-CM

## 2012-03-08 DIAGNOSIS — G8918 Other acute postprocedural pain: Secondary | ICD-10-CM | POA: Insufficient documentation

## 2012-03-08 DIAGNOSIS — R110 Nausea: Secondary | ICD-10-CM

## 2012-03-08 DIAGNOSIS — K560 Paralytic ileus: Secondary | ICD-10-CM | POA: Insufficient documentation

## 2012-03-08 DIAGNOSIS — F17200 Nicotine dependence, unspecified, uncomplicated: Secondary | ICD-10-CM | POA: Insufficient documentation

## 2012-03-08 DIAGNOSIS — R112 Nausea with vomiting, unspecified: Principal | ICD-10-CM | POA: Insufficient documentation

## 2012-03-08 LAB — CBC WITH DIFFERENTIAL/PLATELET
Basophils Relative: 0 % (ref 0–1)
Eosinophils Relative: 4 % (ref 0–5)
HCT: 45.2 % (ref 39.0–52.0)
Hemoglobin: 16.1 g/dL (ref 13.0–17.0)
MCHC: 35.6 g/dL (ref 30.0–36.0)
MCV: 91.1 fL (ref 78.0–100.0)
Monocytes Absolute: 0.6 10*3/uL (ref 0.1–1.0)
Monocytes Relative: 5 % (ref 3–12)
Neutro Abs: 8.8 10*3/uL — ABNORMAL HIGH (ref 1.7–7.7)

## 2012-03-08 LAB — LIPASE, BLOOD: Lipase: 18 U/L (ref 11–59)

## 2012-03-08 LAB — URINALYSIS, ROUTINE W REFLEX MICROSCOPIC
Leukocytes, UA: NEGATIVE
Nitrite: NEGATIVE
Protein, ur: NEGATIVE mg/dL

## 2012-03-08 LAB — COMPREHENSIVE METABOLIC PANEL
Albumin: 4.3 g/dL (ref 3.5–5.2)
BUN: 15 mg/dL (ref 6–23)
CO2: 22 mEq/L (ref 19–32)
Chloride: 99 mEq/L (ref 96–112)
Creatinine, Ser: 0.9 mg/dL (ref 0.50–1.35)
GFR calc non Af Amer: >90 mL/min (ref >90)
Total Bilirubin: 0.7 mg/dL (ref 0.3–1.2)

## 2012-03-08 LAB — URINE MICROSCOPIC-ADD ON

## 2012-03-08 LAB — LACTIC ACID, PLASMA: Lactic Acid, Venous: 1 mmol/L (ref 0.5–2.2)

## 2012-03-08 MED ORDER — ONDANSETRON HCL 4 MG/2ML IJ SOLN
4.0000 mg | Freq: Four times a day (QID) | INTRAMUSCULAR | Status: DC | PRN
  Administered 2012-03-08 – 2012-03-09 (×3): 4 mg via INTRAVENOUS
  Filled 2012-03-08 (×3): qty 2

## 2012-03-08 MED ORDER — SODIUM CHLORIDE 0.9 % IJ SOLN
INTRAMUSCULAR | Status: AC
  Filled 2012-03-08: qty 3

## 2012-03-08 MED ORDER — DIPHENHYDRAMINE HCL 50 MG/ML IJ SOLN: 12.5000 mg | Freq: Four times a day (QID) | INTRAMUSCULAR | Status: DC | PRN

## 2012-03-08 MED ORDER — ONDANSETRON HCL 4 MG/2ML IJ SOLN
4.0000 mg | Freq: Once | INTRAMUSCULAR | Status: AC
  Administered 2012-03-08: 4 mg via INTRAVENOUS
  Filled 2012-03-08: qty 2

## 2012-03-08 MED ORDER — LISINOPRIL-HYDROCHLOROTHIAZIDE 20-25 MG PO TABS: 1.0000 | ORAL_TABLET | Freq: Every day | ORAL | Status: DC

## 2012-03-08 MED ORDER — CITALOPRAM HYDROBROMIDE 20 MG PO TABS
40.0000 mg | ORAL_TABLET | Freq: Every day | ORAL | Status: DC
  Administered 2012-03-08 – 2012-03-10 (×3): 40 mg via ORAL
  Filled 2012-03-08 (×3): qty 2

## 2012-03-08 MED ORDER — OXYCODONE-ACETAMINOPHEN 7.5-325 MG PO TABS: 1.0000 | ORAL_TABLET | ORAL | Status: DC | PRN

## 2012-03-08 MED ORDER — ENOXAPARIN SODIUM 40 MG/0.4ML ~~LOC~~ SOLN
40.0000 mg | SUBCUTANEOUS | Status: DC
  Administered 2012-03-08 – 2012-03-09 (×2): 40 mg via SUBCUTANEOUS
  Filled 2012-03-08 (×2): qty 0.4

## 2012-03-08 MED ORDER — HYDROCHLOROTHIAZIDE 25 MG PO TABS
25.0000 mg | ORAL_TABLET | Freq: Every day | ORAL | Status: DC
  Administered 2012-03-08 – 2012-03-10 (×3): 25 mg via ORAL
  Filled 2012-03-08 (×3): qty 1

## 2012-03-08 MED ORDER — ALBUTEROL SULFATE HFA 108 (90 BASE) MCG/ACT IN AERS
2.0000 | INHALATION_SPRAY | Freq: Four times a day (QID) | RESPIRATORY_TRACT | Status: DC | PRN
  Administered 2012-03-08 – 2012-03-10 (×4): 2 via RESPIRATORY_TRACT
  Filled 2012-03-08: qty 6.7

## 2012-03-08 MED ORDER — HYDROMORPHONE HCL PF 1 MG/ML IJ SOLN
1.0000 mg | Freq: Once | INTRAMUSCULAR | Status: AC
  Administered 2012-03-08: 1 mg via INTRAVENOUS
  Filled 2012-03-08: qty 1

## 2012-03-08 MED ORDER — HYDROMORPHONE HCL PF 1 MG/ML IJ SOLN
1.5000 mg | INTRAMUSCULAR | Status: DC | PRN
  Administered 2012-03-08 (×3): 1.5 mg via INTRAVENOUS
  Administered 2012-03-08: 1 mg via INTRAVENOUS
  Administered 2012-03-09 – 2012-03-10 (×12): 1.5 mg via INTRAVENOUS
  Filled 2012-03-08 (×16): qty 2

## 2012-03-08 MED ORDER — ALPRAZOLAM 1 MG PO TABS
1.0000 mg | ORAL_TABLET | Freq: Three times a day (TID) | ORAL | Status: DC | PRN
  Administered 2012-03-08 – 2012-03-09 (×4): 1 mg via ORAL
  Filled 2012-03-08 (×4): qty 1

## 2012-03-08 MED ORDER — TRAZODONE HCL 50 MG PO TABS
100.0000 mg | ORAL_TABLET | Freq: Every day | ORAL | Status: DC
  Administered 2012-03-08 – 2012-03-09 (×2): 100 mg via ORAL
  Filled 2012-03-08 (×2): qty 2

## 2012-03-08 MED ORDER — SODIUM CHLORIDE 0.9 % IV SOLN
INTRAVENOUS | Status: DC
  Administered 2012-03-08: 11:00:00 via INTRAVENOUS

## 2012-03-08 MED ORDER — LACTATED RINGERS IV SOLN
INTRAVENOUS | Status: DC
  Administered 2012-03-08 – 2012-03-09 (×2): via INTRAVENOUS

## 2012-03-08 MED ORDER — DIPHENHYDRAMINE HCL 12.5 MG/5ML PO ELIX
12.5000 mg | ORAL_SOLUTION | Freq: Four times a day (QID) | ORAL | Status: DC | PRN
  Administered 2012-03-08: 25 mg via ORAL
  Administered 2012-03-09: 12.5 mg via ORAL
  Filled 2012-03-08: qty 10
  Filled 2012-03-08: qty 5

## 2012-03-08 MED ORDER — CARBAMAZEPINE ER 100 MG PO TB12
100.0000 mg | ORAL_TABLET | Freq: Four times a day (QID) | ORAL | Status: DC
Start: 2012-03-08 — End: 2012-03-10
  Administered 2012-03-08 – 2012-03-10 (×7): 100 mg via ORAL
  Filled 2012-03-08 (×12): qty 1

## 2012-03-08 MED ORDER — POLYETHYLENE GLYCOL 3350 17 G PO PACK
17.0000 g | PACK | Freq: Two times a day (BID) | ORAL | Status: DC
  Administered 2012-03-10: 17 g via ORAL
  Filled 2012-03-08 (×3): qty 1

## 2012-03-08 MED ORDER — TOPIRAMATE 100 MG PO TABS
100.0000 mg | ORAL_TABLET | Freq: Two times a day (BID) | ORAL | Status: DC
  Administered 2012-03-08 – 2012-03-10 (×4): 100 mg via ORAL
  Filled 2012-03-08 (×6): qty 1

## 2012-03-08 MED ORDER — SODIUM CHLORIDE 0.9 % IV SOLN
INTRAVENOUS | Status: DC
  Administered 2012-03-08: 14:00:00 via INTRAVENOUS

## 2012-03-08 MED ORDER — SODIUM CHLORIDE 0.9 % IV SOLN
Freq: Once | INTRAVENOUS | Status: AC
  Administered 2012-03-08: 11:00:00 via INTRAVENOUS

## 2012-03-08 MED ORDER — LISINOPRIL 10 MG PO TABS
20.0000 mg | ORAL_TABLET | Freq: Every day | ORAL | Status: DC
  Administered 2012-03-08 – 2012-03-10 (×3): 20 mg via ORAL
  Filled 2012-03-08 (×3): qty 2

## 2012-03-08 MED ORDER — TOPIRAMATE 25 MG PO TABS
ORAL_TABLET | ORAL | Status: AC
  Filled 2012-03-08: qty 4

## 2012-03-08 MED ORDER — CARBAMAZEPINE ER 100 MG PO TB12
ORAL_TABLET | ORAL | Status: AC
  Filled 2012-03-08: qty 2

## 2012-03-08 MED ORDER — SENNA 8.6 MG PO TABS
1.0000 | ORAL_TABLET | Freq: Two times a day (BID) | ORAL | Status: DC
  Administered 2012-03-10: 8.6 mg via ORAL
  Filled 2012-03-08 (×3): qty 1

## 2012-03-08 NOTE — ED Notes (Signed)
Gave patient urinal and asked for sample. Patient stated understanding.

## 2012-03-08 NOTE — ED Provider Notes (Cosign Needed)
History   This chart was scribed for Carleene Cooper III, MD by Charolett Bumpers . The patient was seen in room APA11/AP[facility]11. Patient's care was started at 1019.   CSN: [id number]  Arrival date & time 03/08/12  0945   First MD Initiated Contact with Patient 03/08/12 1019      Chief Complaint  Patient presents with  . Abdominal Pain    The history is provided by the patient. No language interpreter was used.   [name] is a 31 y.o. male who presents to the Emergency Department complaining of constant, moderate diffuse abdominal pain that is worse on the left side with an onset of yesterday. He states the abdominal pain radiates to his back and is aggravated with breathing. He reports associated diaphoresis, cough, nausea, emesis and chills. He states he had 4 abdominal hernia repairs preformed by Dr. Lovell Sheehan 4 days ago. He states that his urination has been dark and his last normal BM was yesterday. He reports taking Tylenol, Percocet, nausea medication and a cough suppressant without relief.  He reports a h/o GERD, HTN and ventral hernias. He reports a h/o cholecystectomy, appendectomy and hernia repair. He report Zofran ODT causes him to vomit, but is able to receive Zofran IV without complications. He states he smokes 3 cigarettes daily and denies any alcohol use.   Past Medical History  Diagnosis Date  . GERD (gastroesophageal reflux disease)     Bravo study Nov 2009 on Prilosec 40 BID with adequate acid suppression  . Hypertension   . Anxiety   . Depression     increased over past several months  . Chronic abdominal pain     HIDA scan Sept 2010, EF 93%, s/p chole oct 2010; evaluated at [facility] for abdominal wall pain; Imipramine added May 2011  . Chronic vomiting     normal GES 2009  . S/P endoscopy     2008 Dr. Karilyn Cota: erosive reflux esophagitis, antral gastritis, H.pylori serologies neg, Nov 2009 Dr. Darrick Penna: gastritis, benign esophageal polyp, no H.pylori,  Feb 2011:  [facility], Dr. Bubba Hales: normal esophagus, normal stomach, normal duodenum, path unremarkable  . Ventral hernia   . Shortness of breath     SOB even with no exertion  . Sleep apnea     supposed to sleep with CPAP    Past Surgical History  Procedure Date  . Ventral hernia repair March 2012    Dr. Lovell Sheehan  . Knee surgery 2009  . Cholecystectomy 10/10  . Appendectomy 5/10  . Esophagogastroduodenoscopy 05/2006    H.pylori neg  . Inguinal hernia repair     child  . Esophagogastroduodenoscopy 03/23/2008    Fields-gastritis benign esophageal polyp, otherwise normal. Negative H. pylori he (propofol)  . Incisional hernia repair 03/04/2012    Procedure: LAPAROSCOPIC INCISIONAL HERNIA;  Surgeon: Dalia Heading, MD;  Location: [facility];  Service: General;  Laterality: N/A;  repair, recurrent  . Insertion of mesh 03/04/2012    Procedure: INSERTION OF MESH;  Surgeon: Dalia Heading, MD;  Location: [facility];  Service: General;  Laterality: N/A;    Family History  Problem Relation Age of Onset  . Colon cancer Neg Hx     History  Substance Use Topics  . Smoking status: Current Every Day Smoker -- 1.0 packs/day for 15 years    Types: Cigarettes  . Smokeless tobacco: Never Used     Comment: Less than 1/2 pk a day. trying to quit.  03/04/12 1255 states "was  doing good, but now up to about a pack per day"  . Alcohol Use: No      Review of Systems  Constitutional: Positive for chills and diaphoresis. Negative for fever.  Respiratory: Positive for cough. Negative for shortness of breath.   Gastrointestinal: Positive for nausea, vomiting and abdominal pain. Negative for diarrhea.  Genitourinary: Negative for difficulty urinating.  Neurological: Negative for weakness.  All other systems reviewed and are negative.    Allergies  Dexilant; Propoxyphene-acetaminophen; Zofran; and Zofran odt  Home Medications   Current Outpatient Rx  Name  Route  Sig  Dispense  Refill  . ALBUTEROL SULFATE HFA  108 (90 BASE) MCG/ACT IN AERS   Inhalation   Inhale 2 puffs into the lungs every 6 (six) hours as needed. Shortness of breath          . ALPRAZOLAM 1 MG PO TABS   Oral   Take 1 mg by mouth 4 (four) times daily.           Marland Kitchen CARBAMAZEPINE ER 100 MG PO TB12   Oral   Take 100 mg by mouth 4 (four) times daily.         Marland Kitchen CITALOPRAM HYDROBROMIDE 40 MG PO TABS   Oral   Take 40 mg by mouth daily.         Marland Kitchen DIPHENHYDRAMINE HCL 25 MG PO TABS   Oral   Take 25 mg by mouth every 6 (six) hours as needed. For allergies         . LISINOPRIL-HYDROCHLOROTHIAZIDE 20-25 MG PO TABS   Oral   Take 1 tablet by mouth daily.           Marland Kitchen OMEPRAZOLE 40 MG PO CPDR   Oral   Take 1 capsule (40 mg total) by mouth 2 (two) times daily.   60 capsule   5   . OXYCODONE-ACETAMINOPHEN 7.5-325 MG PO TABS   Oral   Take 1-2 tablets by mouth every 4 (four) hours as needed. For Pain         . PROMETHAZINE HCL 25 MG PO TABS   Oral   Take 1 tablet (25 mg total) by mouth every 6 (six) hours as needed for nausea.   15 tablet   0   . SUCRALFATE 1 G PO TABS   Oral   Take 1 g by mouth 4 (four) times daily. *DISSOLVE ONE TABLET IN WATER AND DRINK FOR 14 DAYS* FILLED January 29, 2012         . TOPIRAMATE 100 MG PO TABS   Oral   Take 100 mg by mouth 2 (two) times daily.          . TRAZODONE HCL 100 MG PO TABS   Oral   Take 100 mg by mouth at bedtime. For sleep           BP 133/79  Pulse 72  Temp 98.4 F (36.9 C) (Oral)  Resp 16  Ht 5\' 9"  (1.753 m)  Wt 280 lb (127.007 kg)  BMI 41.35 kg/m2  SpO2 96%  Physical Exam  Nursing note and vitals reviewed. Constitutional: He is oriented to person, place, and time. He appears well-developed and well-nourished. No distress.  HENT:  Head: Normocephalic and atraumatic.  Right Ear: External ear normal.  Left Ear: External ear normal.  Nose: Nose normal.  Mouth/Throat: Posterior oropharyngeal erythema present. No oropharyngeal exudate.        TM's normal bilaterally. Mild posterior oropharyngeal erythema noted  on exam.   Eyes: Conjunctivae normal and EOM are normal. Pupils are equal, round, and reactive to light.  Neck: Normal range of motion. Neck supple. No tracheal deviation present.  Cardiovascular: Normal rate and regular rhythm.   No murmur heard. Pulmonary/Chest: Effort normal. No respiratory distress. He has no wheezes. He has rhonchi.       Coarse rhonchi bilaterally.   Abdominal: Soft. Bowel sounds are normal. He exhibits no distension. There is tenderness.       Mild diffuse abdominal tenderness.   Musculoskeletal: Normal range of motion. He exhibits no edema and no tenderness.  Lymphadenopathy:    He has no cervical adenopathy.  Neurological: He is alert and oriented to person, place, and time. No cranial nerve deficit.  Skin: Skin is warm. He is diaphoretic.       Profusely diaphoretic.   Psychiatric: He has a normal mood and affect. His behavior is normal.    ED Course  Procedures (including critical care time)  DIAGNOSTIC STUDIES: Oxygen Saturation is 95% on room air, normal by my interpretation.    COORDINATION OF CARE:  10:35-Discussed planned course of treatment with the patient including IV fluids, pain and nausea medication, x-ray of abdomen/chest, blood work and UA, who is agreeable at this time.   10:45-Medication Orders: Ondansetron (Zofran) injection 4 mg-once; Hydromorphone (Dilaudid) injection 1 mg-once; 0.9% sodium chloride infusion-continuous.   13:05-Informed pt of imaging and lab results. Will consult Dr. Lovell Sheehan concerning the mild ileus found on imaging. Will order additional pain medication per pt's request.   14:41-Consultation with Dr. Lovell Sheehan, discussed pt's case. Dr. Lovell Sheehan states pt can be d/c home if he is able to tolerate clear liquids. Informed pt of plan, who is agreeable at this time.   3:22 PM Continues to complain of nausea, and bad pain when stood up.  Recontacted Dr.  Lovell Sheehan --> admit to observation status.   Results for orders placed during the hospital encounter of 03/08/12  CBC WITH DIFFERENTIAL      Component Value Range   WBC 11.8 (*) 4.0 - 10.5 K/uL   RBC 4.96  4.22 - 5.81 MIL/uL   Hemoglobin 16.1  13.0 - 17.0 g/dL   HCT 16.1  09.6 - 04.5 %   MCV 91.1  78.0 - 100.0 fL   MCH 32.5  26.0 - 34.0 pg   MCHC 35.6  30.0 - 36.0 g/dL   RDW 40.9  81.1 - 91.4 %   Platelets 248  150 - 400 K/uL   Neutrophils Relative 75  43 - 77 %   Neutro Abs 8.8 (*) 1.7 - 7.7 K/uL   Lymphocytes Relative 17  12 - 46 %   Lymphs Abs 2.0  0.7 - 4.0 K/uL   Monocytes Relative 5  3 - 12 %   Monocytes Absolute 0.6  0.1 - 1.0 K/uL   Eosinophils Relative 4  0 - 5 %   Eosinophils Absolute 0.4  0.0 - 0.7 K/uL   Basophils Relative 0  0 - 1 %   Basophils Absolute 0.0  0.0 - 0.1 K/uL  COMPREHENSIVE METABOLIC PANEL      Component Value Range   Sodium 135  135 - 145 mEq/L   Potassium 3.7  3.5 - 5.1 mEq/L   Chloride 99  96 - 112 mEq/L   CO2 22  19 - 32 mEq/L   Glucose, Bld 125 (*) 70 - 99 mg/dL   BUN 15  6 - 23 mg/dL  Creatinine, Ser 0.90  0.50 - 1.35 mg/dL   Calcium 9.7  8.4 - 16.1 mg/dL   Total Protein 7.8  6.0 - 8.3 g/dL   Albumin 4.3  3.5 - 5.2 g/dL   AST 13  0 - 37 U/L   ALT 20  0 - 53 U/L   Alkaline Phosphatase 115  39 - 117 U/L   Total Bilirubin 0.7  0.3 - 1.2 mg/dL   GFR calc non Af Amer >90  >90 mL/min   GFR calc Af Amer >90  >90 mL/min  LIPASE, BLOOD      Component Value Range   Lipase 18  11 - 59 U/L  URINALYSIS, ROUTINE W REFLEX MICROSCOPIC      Component Value Range   Color, Urine YELLOW  YELLOW   APPearance CLEAR  CLEAR   Specific Gravity, Urine 1.020  1.005 - 1.030   pH 6.0  5.0 - 8.0   Glucose, UA NEGATIVE  NEGATIVE mg/dL   Hgb urine dipstick SMALL (*) NEGATIVE   Bilirubin Urine SMALL (*) NEGATIVE   Ketones, ur TRACE (*) NEGATIVE mg/dL   Protein, ur NEGATIVE  NEGATIVE mg/dL   Urobilinogen, UA 1.0  0.0 - 1.0 mg/dL   Nitrite NEGATIVE  NEGATIVE    Leukocytes, UA NEGATIVE  NEGATIVE  LACTIC ACID, PLASMA      Component Value Range   Lactic Acid, Venous 1.0  0.5 - 2.2 mmol/L  URINE MICROSCOPIC-ADD ON      Component Value Range   WBC, UA 3-6  <3 WBC/hpf   RBC / HPF 7-10  <3 RBC/hpf   Bacteria, UA RARE  RARE    Dg Abd Acute W/chest  03/08/2012  *RADIOLOGY REPORT*  Clinical Data: Cough, fever and chills  ACUTE ABDOMEN SERIES (ABDOMEN 2 VIEW & CHEST 1 VIEW)  Comparison: 02/25/2012  Findings:  Normal heart size.  No pleural effusion or edema.  No airspace consolidation.  Surgical clips identified within the right upper quadrant of the abdomen.  Hernia mesh overlies the left abdomen. Skin staples are noted in the right midabdomen and right lower quadrant.  The bowel gas pattern appears nonspecific.  There is gas and stool up to the colon.  Within the left upper quadrant there are several dilated loops of small bowel measuring up to 3.6 cm. No fluid levels identified.  IMPRESSION:  1.  Suspect mild ileus. 2.  No pneumonia.   Original Report Authenticated By: Signa Kell, M.D.      1. Postoperative nausea     I personally performed the services described in this documentation, which was scribed in my presence. The recorded information has been reviewed and considered.  Osvaldo Human, MD     Carleene Cooper III, MD 03/08/12 [id number]

## 2012-03-08 NOTE — ED Notes (Addendum)
Pt c/o left side abd pain, mid thoracic back pain that started yesterday, pt recently had  Abd. hernia repair on 03/04/2012. Pt also admits to n/v/d, cough that is productive with yellow sputum production. Unsure of any fever. States "I have been sweating a lot", Dr. Ignacia Palma at bedside, Abd tender to palpation.

## 2012-03-08 NOTE — ED Notes (Signed)
Pt reports had 4 hernias repaired this past Wednesday.  PT says yesterday pain started getting worse, became diaphoretic and started vomiting.  PT diaphoretic at this time.  C/O pain to left side and back area.

## 2012-03-09 LAB — BASIC METABOLIC PANEL
GFR calc Af Amer: >90 mL/min (ref >90)
GFR calc non Af Amer: >90 mL/min (ref >90)
Potassium: 3.3 mEq/L — ABNORMAL LOW (ref 3.5–5.1)
Sodium: 134 mEq/L — ABNORMAL LOW (ref 135–145)

## 2012-03-09 LAB — CBC
MCV: 93.4 fL (ref 78.0–100.0)
Platelets: 201 10*3/uL (ref 150–400)
RDW: 13 % (ref 11.5–15.5)
WBC: 9.2 10*3/uL (ref 4.0–10.5)

## 2012-03-09 LAB — MAGNESIUM: Magnesium: 2.1 mg/dL (ref 1.5–2.5)

## 2012-03-09 LAB — PHOSPHORUS: Phosphorus: 4.5 mg/dL (ref 2.3–4.6)

## 2012-03-09 MED ORDER — SODIUM CHLORIDE 0.9 % IJ SOLN
INTRAMUSCULAR | Status: AC
  Administered 2012-03-09: 10 mL
  Filled 2012-03-09: qty 3

## 2012-03-09 MED ORDER — PANTOPRAZOLE SODIUM 40 MG PO TBEC
40.0000 mg | DELAYED_RELEASE_TABLET | Freq: Two times a day (BID) | ORAL | Status: DC
  Administered 2012-03-09 – 2012-03-10 (×2): 40 mg via ORAL
  Filled 2012-03-09 (×2): qty 1

## 2012-03-09 MED ORDER — KCL IN DEXTROSE-NACL 40-5-0.45 MEQ/L-%-% IV SOLN
INTRAVENOUS | Status: DC
  Administered 2012-03-09 – 2012-03-10 (×2): via INTRAVENOUS

## 2012-03-09 MED ORDER — SODIUM CHLORIDE 0.9 % IJ SOLN
INTRAMUSCULAR | Status: AC
  Administered 2012-03-09: 12:00:00
  Filled 2012-03-09: qty 3

## 2012-03-09 NOTE — Progress Notes (Signed)
UR Chart Review Completed  

## 2012-03-09 NOTE — H&P (Signed)
[name] is an 31 y.o. male.   Chief Complaint: Nausea and abdominal pain HPI: Patient is a 31 year old morbidly obese white male who underwent a laparoscopic incisional herniorrhaphy with mesh on 03/04/2012 who presented emergency room on 03/08/2012 with worsening nausea and abdominal pain. His abdominal pain is located on the left side. X-rays revealed a mild ileus. His laboratory tests were for the most part unremarkable. [name] states [name] has episodes of diaphoresis, though [name] has had episodes of diaphoresis preoperatively. [name] denies any diarrhea.  Past Medical History  Diagnosis Date  . GERD (gastroesophageal reflux disease)     Bravo study Nov 2009 on Prilosec 40 BID with adequate acid suppression  . Hypertension   . Anxiety   . Depression     increased over past several months  . Chronic abdominal pain     HIDA scan Sept 2010, EF 93%, s/p chole oct 2010; evaluated at [facility] for abdominal wall pain; Imipramine added May 2011  . Chronic vomiting     normal GES 2009  . S/P endoscopy     2008 Dr. Karilyn Cota: erosive reflux esophagitis, antral gastritis, H.pylori serologies neg, Nov 2009 Dr. Darrick Penna: gastritis, benign esophageal polyp, no H.pylori,  Feb 2011: [facility], Dr. Bubba Hales: normal esophagus, normal stomach, normal duodenum, path unremarkable  . Ventral hernia   . Shortness of breath     SOB even with no exertion  . Sleep apnea     supposed to sleep with CPAP    Past Surgical History  Procedure Date  . Ventral hernia repair March 2012    Dr. Lovell Sheehan  . Knee surgery 2009  . Cholecystectomy 10/10  . Appendectomy 5/10  . Esophagogastroduodenoscopy 05/2006    H.pylori neg  . Inguinal hernia repair     child  . Esophagogastroduodenoscopy 03/23/2008    Fields-gastritis benign esophageal polyp, otherwise normal. Negative H. pylori [name] (propofol)  . Incisional hernia repair 03/04/2012    Procedure: LAPAROSCOPIC INCISIONAL HERNIA;  Surgeon: Dalia Heading, MD;  Location: [facility];   Service: General;  Laterality: N/A;  repair, recurrent  . Insertion of mesh 03/04/2012    Procedure: INSERTION OF MESH;  Surgeon: Dalia Heading, MD;  Location: [facility];  Service: General;  Laterality: N/A;    Family History  Problem Relation Age of Onset  . Colon cancer Neg Hx    Social History:  reports that [name] has been smoking Cigarettes.  [name] has a 15 pack-year smoking history. [name] has never used smokeless tobacco. [name] reports that [name] does not drink alcohol or use illicit drugs.  Allergies:  Allergies  Allergen Reactions  . Dexilant (Dexlansoprazole) Other (See Comments)    Pass out  . Propoxyphene-Acetaminophen Other (See Comments)    Unknown   . Zofran (Ondansetron Hcl) Nausea And Vomiting  . Zofran Odt (Ondansetron)     vomit    Medications Prior to Admission  Medication Sig Dispense Refill  . albuterol (PROVENTIL HFA;VENTOLIN HFA) 108 (90 BASE) MCG/ACT inhaler Inhale 2 puffs into the lungs every 6 (six) hours as needed. Shortness of breath       . ALPRAZolam (XANAX) 1 MG tablet Take 1 mg by mouth 4 (four) times daily.        . carbamazepine (TEGRETOL XR) 100 MG 12 hr tablet Take 100 mg by mouth 4 (four) times daily.      . citalopram (CELEXA) 40 MG tablet Take 40 mg by mouth daily.      . diphenhydrAMINE (BENADRYL)  25 MG tablet Take 25 mg by mouth every 6 (six) hours as needed. For allergies      . lisinopril-hydrochlorothiazide (PRINZIDE,ZESTORETIC) 20-25 MG per tablet Take 1 tablet by mouth daily.        Marland Kitchen omeprazole (PRILOSEC) 40 MG capsule Take 1 capsule (40 mg total) by mouth 2 (two) times daily.  60 capsule  5  . oxyCODONE-acetaminophen (PERCOCET) 7.5-325 MG per tablet Take 1-2 tablets by mouth every 4 (four) hours as needed. For Pain      . promethazine (PHENERGAN) 25 MG tablet Take 1 tablet (25 mg total) by mouth every 6 (six) hours as needed for nausea.  15 tablet  0  . sucralfate (CARAFATE) 1 G tablet Take 1 g by mouth 4 (four) times daily. *DISSOLVE ONE TABLET IN  WATER AND DRINK FOR 14 DAYS* FILLED January 29, 2012      . topiramate (TOPAMAX) 100 MG tablet Take 100 mg by mouth 2 (two) times daily.       . traZODone (DESYREL) 100 MG tablet Take 100 mg by mouth at bedtime. For sleep        Results for orders placed during the hospital encounter of 03/08/12 (from the past 48 hour(s))  CBC WITH DIFFERENTIAL     Status: Abnormal   Collection Time   03/08/12 10:38 AM      Component Value Range Comment   WBC 11.8 (*) 4.0 - 10.5 K/uL    RBC 4.96  4.22 - 5.81 MIL/uL    Hemoglobin 16.1  13.0 - 17.0 g/dL    HCT 45.4  09.8 - 11.9 %    MCV 91.1  78.0 - 100.0 fL    MCH 32.5  26.0 - 34.0 pg    MCHC 35.6  30.0 - 36.0 g/dL    RDW 14.7  82.9 - 56.2 %    Platelets 248  150 - 400 K/uL    Neutrophils Relative 75  43 - 77 %    Neutro Abs 8.8 (*) 1.7 - 7.7 K/uL    Lymphocytes Relative 17  12 - 46 %    Lymphs Abs 2.0  0.7 - 4.0 K/uL    Monocytes Relative 5  3 - 12 %    Monocytes Absolute 0.6  0.1 - 1.0 K/uL    Eosinophils Relative 4  0 - 5 %    Eosinophils Absolute 0.4  0.0 - 0.7 K/uL    Basophils Relative 0  0 - 1 %    Basophils Absolute 0.0  0.0 - 0.1 K/uL   COMPREHENSIVE METABOLIC PANEL     Status: Abnormal   Collection Time   03/08/12 10:38 AM      Component Value Range Comment   Sodium 135  135 - 145 mEq/L    Potassium 3.7  3.5 - 5.1 mEq/L    Chloride 99  96 - 112 mEq/L    CO2 22  19 - 32 mEq/L    Glucose, Bld 125 (*) 70 - 99 mg/dL    BUN 15  6 - 23 mg/dL    Creatinine, Ser 1.30  0.50 - 1.35 mg/dL    Calcium 9.7  8.4 - 86.5 mg/dL    Total Protein 7.8  6.0 - 8.3 g/dL    Albumin 4.3  3.5 - 5.2 g/dL    AST 13  0 - 37 U/L    ALT 20  0 - 53 U/L    Alkaline Phosphatase 115  39 - 117 U/L  Total Bilirubin 0.7  0.3 - 1.2 mg/dL    GFR calc non Af Amer >90  >90 mL/min    GFR calc Af Amer >90  >90 mL/min   LIPASE, BLOOD     Status: Normal   Collection Time   03/08/12 10:38 AM      Component Value Range Comment   Lipase 18  11 - 59 U/L   LACTIC ACID,  PLASMA     Status: Normal   Collection Time   03/08/12 10:40 AM      Component Value Range Comment   Lactic Acid, Venous 1.0  0.5 - 2.2 mmol/L   URINALYSIS, ROUTINE W REFLEX MICROSCOPIC     Status: Abnormal   Collection Time   03/08/12 12:46 PM      Component Value Range Comment   Color, Urine YELLOW  YELLOW    APPearance CLEAR  CLEAR    Specific Gravity, Urine 1.020  1.005 - 1.030    pH 6.0  5.0 - 8.0    Glucose, UA NEGATIVE  NEGATIVE mg/dL    Hgb urine dipstick SMALL (*) NEGATIVE    Bilirubin Urine SMALL (*) NEGATIVE    Ketones, ur TRACE (*) NEGATIVE mg/dL    Protein, ur NEGATIVE  NEGATIVE mg/dL    Urobilinogen, UA 1.0  0.0 - 1.0 mg/dL    Nitrite NEGATIVE  NEGATIVE    Leukocytes, UA NEGATIVE  NEGATIVE   URINE MICROSCOPIC-ADD ON     Status: Normal   Collection Time   03/08/12 12:46 PM      Component Value Range Comment   WBC, UA 3-6  <3 WBC/hpf    RBC / HPF 7-10  <3 RBC/hpf    Bacteria, UA RARE  RARE   BASIC METABOLIC PANEL     Status: Abnormal   Collection Time   03/09/12  4:42 AM      Component Value Range Comment   Sodium 134 (*) 135 - 145 mEq/L    Potassium 3.3 (*) 3.5 - 5.1 mEq/L    Chloride 99  96 - 112 mEq/L    CO2 25  19 - 32 mEq/L    Glucose, Bld 100 (*) 70 - 99 mg/dL    BUN 13  6 - 23 mg/dL    Creatinine, Ser 1.61  0.50 - 1.35 mg/dL    Calcium 9.1  8.4 - 09.6 mg/dL    GFR calc non Af Amer >90  >90 mL/min    GFR calc Af Amer >90  >90 mL/min   MAGNESIUM     Status: Normal   Collection Time   03/09/12  4:42 AM      Component Value Range Comment   Magnesium 2.1  1.5 - 2.5 mg/dL   PHOSPHORUS     Status: Normal   Collection Time   03/09/12  4:42 AM      Component Value Range Comment   Phosphorus 4.5  2.3 - 4.6 mg/dL   CBC     Status: Normal   Collection Time   03/09/12  4:42 AM      Component Value Range Comment   WBC 9.2  4.0 - 10.5 K/uL    RBC 4.41  4.22 - 5.81 MIL/uL    Hemoglobin 14.1  13.0 - 17.0 g/dL    HCT 04.5  40.9 - 81.1 %    MCV 93.4  78.0 - 100.0  fL    MCH 32.0  26.0 - 34.0 pg    MCHC 34.2  30.0 - 36.0 g/dL    RDW 16.1  09.6 - 04.5 %    Platelets 201  150 - 400 K/uL    Dg Abd Acute W/chest  03/08/2012  *RADIOLOGY REPORT*  Clinical Data: Cough, fever and chills  ACUTE ABDOMEN SERIES (ABDOMEN 2 VIEW & CHEST 1 VIEW)  Comparison: 02/25/2012  Findings:  Normal heart size.  No pleural effusion or edema.  No airspace consolidation.  Surgical clips identified within the right upper quadrant of the abdomen.  Hernia mesh overlies the left abdomen. Skin staples are noted in the right midabdomen and right lower quadrant.  The bowel gas pattern appears nonspecific.  There is gas and stool up to the colon.  Within the left upper quadrant there are several dilated loops of small bowel measuring up to 3.6 cm. No fluid levels identified.  IMPRESSION:  1.  Suspect mild ileus. 2.  No pneumonia.   Original Report Authenticated By: Signa Kell, M.D.     Review of Systems  Constitutional: Positive for diaphoresis.  HENT: Negative.   Eyes: Negative.   Respiratory: Positive for wheezing.   Cardiovascular: Negative.   Gastrointestinal: Positive for nausea and abdominal pain.  Genitourinary: Negative.   Musculoskeletal: Negative.   Skin: Negative.   Neurological: Negative.   Endo/Heme/Allergies: Negative.     Blood pressure 131/74, pulse 66, temperature 97 F (36.1 C), temperature source Oral, resp. rate 20, height 5\' 9"  (1.753 m), weight 131.588 kg (290 lb 1.6 oz), SpO2 99.00%. Physical Exam  Constitutional: [name] is oriented to person, place, and time. [name] appears well-developed and well-nourished.  HENT:  Head: Normocephalic and atraumatic.  Eyes: Pupils are equal, round, and reactive to light.  Neck: Normal range of motion. Neck supple.  Cardiovascular: Normal rate, regular rhythm and normal heart sounds.   Respiratory: Effort normal and breath sounds normal. [name] has no wheezes.  GI: Soft. Bowel sounds are normal. There is tenderness.       Tender  along left side of abdomen with some ecchymosis present around trocar sites.  Neurological: [name] is alert and oriented to person, place, and time.  Skin: Skin is warm and dry.     Assessment/Plan Impression: Postoperative nausea and incisional pain. Given the patient has a history of diaphoresis and preoperative abdominal pain, I feel that this is more secondary to incisional pain than any particular complication from surgery. [name] also has a history of chronic pain. Plan: We'll bring the patient into the hospital for observation to control his pain and nausea. No need for antibiotics at this point. We'll continue to monitor him.   Darrious Youman A 03/09/2012, 9:33 AM

## 2012-03-09 NOTE — Care Management Note (Signed)
    Page 1 of 1   03/10/2012     1:50:13 PM   CARE MANAGEMENT NOTE 03/10/2012  Patient:  [name], [name]   Account Number:  [id number]  Date Initiated:  03/09/2012  Documentation initiated by:  Rosemary Holms  Subjective/Objective Assessment:   Pt admitted from home where he lives alone. Has friends and family for support as needed. Admitted for post surgical pain, nausea and vomitting.     Action/Plan:   DC home   Anticipated DC Date:  03/10/2012   Anticipated DC Plan:  HOME/SELF CARE      DC Planning Services  CM consult      Choice offered to / List presented to:             Status of service:  Completed, signed off Medicare Important Message given?   (If response is "NO", the following Medicare IM given date fields will be blank) Date Medicare IM given:   Date Additional Medicare IM given:    Discharge Disposition:  HOME/SELF CARE  Per UR Regulation:    If discussed at Long Length of Stay Meetings, dates discussed:    Comments:  03/09/12 Rosemary Holms RN BSN CM

## 2012-03-09 NOTE — Progress Notes (Signed)
03/09/12 1749 Patient c/o abdominal discomfort and requests pain medication as ordered PRN. States pain decreased after dilaudid given as ordered, and increased with activity. Ambulated in hallway with nurse tech supervision this morning, stated tolerated well. No c/o nausea this afternoon, eating subway sandwich his mother brought him for supper (states spicy italian sub), tolerating well with no complaints of nausea at this time.

## 2012-03-09 NOTE — Progress Notes (Signed)
03/09/12 1805 Patient ambulating in hallway, from his room to opposite end of hall and back,  mother walking with him. Tolerating well.

## 2012-03-10 MED ORDER — HYDROMORPHONE HCL 2 MG PO TABS: 4.0000 mg | ORAL_TABLET | ORAL | Status: DC | PRN

## 2012-03-10 NOTE — Progress Notes (Signed)
Pt and pt's father provided with discharge instructions.  Pt provided with prescription for pain medication and given inhaler from respiratory.  Pt and pt's father verbalized understanding of d/c instructions with teach back.  Pt's IV removed.  Pt tolerated well.  Pt refused wheelchair for transport at d/c.  Pt requested to ambulate to main entrance for d/c.  Pt stable at time of discharge.

## 2012-03-10 NOTE — Discharge Summary (Signed)
Physician Discharge Summary  Patient ID: [name] MRN: [id number] DOB/AGE: 1980/08/26 31 y.o.  Admit date: 03/08/2012 Discharge date: 03/10/2012  Admission Diagnoses: Postoperative pain and nausea, status post laparoscopic incisional herniorrhaphy with mesh  Discharge Diagnoses: Same Active Problems:  * No active hospital problems. *    Discharged Condition: fair  Hospital Course: Patient is a 31 year old white male with multiple medical problems who underwent a laparoscopic recurrent incisional herniorrhaphy with mesh on 03/04/2012. He presented back to the emergency room on 03/08/2012 with uncontrollable incisional pain. He also was suffering from some nausea. He has a history of chronic pain and has been on Percocet in the past. His abdominal films were remarkable only for a mild ileus. No free air was seen. He had a slight elevation in his white blood cell count, but this normalized the following day. He was admitted to the hospital for control of his pain and nausea. His abdominal pain is somewhat eased with Tylenol it. He'll be switched from Percocet to dialogue with her as an outpatient.  Discharge Exam: Blood pressure 143/76, pulse 76, temperature 97.7 F (36.5 C), temperature source Oral, resp. rate 20, height 5\' 9"  (1.753 m), weight 131.588 kg (290 lb 1.6 oz), SpO2 98.00%. General appearance: alert, cooperative and morbidly obese Resp: clear to auscultation bilaterally Cardio: regular rate and rhythm, S1, S2 normal, no murmur, click, rub or gallop GI: and in and soft. Incision is healing well. Ecchymosis noted around the left lower trocar site. No rigidity noted.  Disposition: 01-Home or Self Care     Medication List     As of 03/10/2012  9:53 AM    STOP taking these medications         oxyCODONE-acetaminophen 7.5-325 MG per tablet   Commonly known as: PERCOCET      TAKE these medications         albuterol 108 (90 BASE) MCG/ACT inhaler   Commonly known as:  PROVENTIL HFA;VENTOLIN HFA   Inhale 2 puffs into the lungs every 6 (six) hours as needed. Shortness of breath      ALPRAZolam 1 MG tablet   Commonly known as: XANAX   Take 1 mg by mouth 4 (four) times daily.      carbamazepine 100 MG 12 hr tablet   Commonly known as: TEGRETOL XR   Take 100 mg by mouth 4 (four) times daily.      citalopram 40 MG tablet   Commonly known as: CELEXA   Take 40 mg by mouth daily.      diphenhydrAMINE 25 MG tablet   Commonly known as: BENADRYL   Take 25 mg by mouth every 6 (six) hours as needed. For allergies      HYDROmorphone 2 MG tablet   Commonly known as: DILAUDID   Take 2 tablets (4 mg total) by mouth every 4 (four) hours as needed for pain.      lisinopril-hydrochlorothiazide 20-25 MG per tablet   Commonly known as: PRINZIDE,ZESTORETIC   Take 1 tablet by mouth daily.      omeprazole 40 MG capsule   Commonly known as: PRILOSEC   Take 1 capsule (40 mg total) by mouth 2 (two) times daily.      promethazine 25 MG tablet   Commonly known as: PHENERGAN   Take 1 tablet (25 mg total) by mouth every 6 (six) hours as needed for nausea.      sucralfate 1 G tablet   Commonly known as: CARAFATE   Take  1 g by mouth 4 (four) times daily. *DISSOLVE ONE TABLET IN WATER AND DRINK FOR 14 DAYS* FILLED January 29, 2012      topiramate 100 MG tablet   Commonly known as: TOPAMAX   Take 100 mg by mouth 2 (two) times daily.      traZODone 100 MG tablet   Commonly known as: DESYREL   Take 100 mg by mouth at bedtime. For sleep           Follow-up Information    Follow up with Dalia Heading, MD. (as previously scheduled)    Contact information:   [address] [place] [address] [phone]          Signed: Franky Macho A 03/10/2012, 9:53 AM

## 2012-04-20 ENCOUNTER — Encounter (HOSPITAL_COMMUNITY): Payer: Self-pay | Admitting: *Deleted

## 2012-04-20 ENCOUNTER — Emergency Department (HOSPITAL_COMMUNITY): Payer: Medicaid Other

## 2012-04-20 ENCOUNTER — Emergency Department (HOSPITAL_COMMUNITY)
Admission: EM | Admit: 2012-04-20 | Discharge: 2012-04-20 | Disposition: A | Discharge status: Home / Self Care | Payer: Medicaid Other | Attending: Emergency Medicine | Admitting: Emergency Medicine

## 2012-04-20 DIAGNOSIS — G4730 Sleep apnea, unspecified: Secondary | ICD-10-CM | POA: Insufficient documentation

## 2012-04-20 DIAGNOSIS — I10 Essential (primary) hypertension: Secondary | ICD-10-CM | POA: Insufficient documentation

## 2012-04-20 DIAGNOSIS — F3289 Other specified depressive episodes: Secondary | ICD-10-CM | POA: Insufficient documentation

## 2012-04-20 DIAGNOSIS — K219 Gastro-esophageal reflux disease without esophagitis: Secondary | ICD-10-CM | POA: Insufficient documentation

## 2012-04-20 DIAGNOSIS — F329 Major depressive disorder, single episode, unspecified: Secondary | ICD-10-CM | POA: Insufficient documentation

## 2012-04-20 DIAGNOSIS — S2341XA Sprain of ribs, initial encounter: Secondary | ICD-10-CM | POA: Insufficient documentation

## 2012-04-20 DIAGNOSIS — Y9389 Activity, other specified: Secondary | ICD-10-CM | POA: Insufficient documentation

## 2012-04-20 DIAGNOSIS — Z79899 Other long term (current) drug therapy: Secondary | ICD-10-CM | POA: Insufficient documentation

## 2012-04-20 DIAGNOSIS — X500XXA Overexertion from strenuous movement or load, initial encounter: Secondary | ICD-10-CM | POA: Insufficient documentation

## 2012-04-20 DIAGNOSIS — F411 Generalized anxiety disorder: Secondary | ICD-10-CM | POA: Insufficient documentation

## 2012-04-20 DIAGNOSIS — F17200 Nicotine dependence, unspecified, uncomplicated: Secondary | ICD-10-CM | POA: Insufficient documentation

## 2012-04-20 DIAGNOSIS — Z9889 Other specified postprocedural states: Secondary | ICD-10-CM | POA: Insufficient documentation

## 2012-04-20 DIAGNOSIS — S29011A Strain of muscle and tendon of front wall of thorax, initial encounter: Secondary | ICD-10-CM

## 2012-04-20 DIAGNOSIS — Z8719 Personal history of other diseases of the digestive system: Secondary | ICD-10-CM | POA: Insufficient documentation

## 2012-04-20 DIAGNOSIS — Y929 Unspecified place or not applicable: Secondary | ICD-10-CM | POA: Insufficient documentation

## 2012-04-20 MED ORDER — HYDROCODONE-ACETAMINOPHEN 5-325 MG PO TABS
1.0000 | ORAL_TABLET | Freq: Once | ORAL | Status: AC
  Administered 2012-04-20: 1 via ORAL
  Filled 2012-04-20: qty 1

## 2012-04-20 MED ORDER — IBUPROFEN 800 MG PO TABS
800.0000 mg | ORAL_TABLET | Freq: Once | ORAL | Status: AC
  Administered 2012-04-20: 800 mg via ORAL
  Filled 2012-04-20: qty 1

## 2012-04-20 MED ORDER — HYDROCODONE-ACETAMINOPHEN 5-325 MG PO TABS: 1.0000 | ORAL_TABLET | ORAL | Status: DC | PRN

## 2012-04-20 NOTE — ED Notes (Signed)
Pt discharged. Pt stable at time of discharge. Medications reviewed pt has no questions regarding discharge at this time. Pt voiced understanding of discharge instructions.  

## 2012-04-20 NOTE — ED Provider Notes (Signed)
History    This chart was scribed for Jeffrey Booze, MD, MD by Smitty Pluck, ED Scribe. The patient was seen in room [facility]10 and the patient's care was started at 7:51PM.   CSN: [id number]  Arrival date & time 04/20/12  1752      Chief Complaint  Patient presents with  . Gastrophageal Reflux  . Fall  . Flank Pain    (Consider location/radiation/quality/duration/timing/severity/associated sxs/prior treatment) The history is provided by the patient. No language interpreter was used.   [name] is a 31 y.o. male with hx of GERD, HTN, chronic abdominal pain, SOB and hernia who presents to the Emergency Department complaining of constant, moderate left abdomen and left chest wall pain onset 1 day ago. Pt reports that he almost fell but caught himself and noticed the pain immediately afterwards. The pain is rated at 8/10. Pt reports taking percocet at home without relief. He reports that the pain is aggravated when he coughs or takes deep breath. He had hernia repair surgery in October 2013. He reports smoking 1 pack of cigarettes/day. Denies fever, chills, nausea, vomiting, diaphoresis, back pain, LOC, head pain and any other pain.   Past Medical History  Diagnosis Date  . GERD (gastroesophageal reflux disease)     Bravo study Nov 2009 on Prilosec 40 BID with adequate acid suppression  . Hypertension   . Anxiety   . Depression     increased over past several months  . Chronic abdominal pain     HIDA scan Sept 2010, EF 93%, s/p chole oct 2010; evaluated at [facility] for abdominal wall pain; Imipramine added May 2011  . Chronic vomiting     normal GES 2009  . S/P endoscopy     2008 Dr. Karilyn Cota: erosive reflux esophagitis, antral gastritis, H.pylori serologies neg, Nov 2009 Dr. Darrick Penna: gastritis, benign esophageal polyp, no H.pylori,  Feb 2011: [facility], Dr. Bubba Hales: normal esophagus, normal stomach, normal duodenum, path unremarkable  . Ventral hernia   . Shortness of breath     SOB  even with no exertion  . Sleep apnea     supposed to sleep with CPAP    Past Surgical History  Procedure Date  . Ventral hernia repair March 2012    Dr. Lovell Sheehan  . Knee surgery 2009  . Cholecystectomy 10/10  . Appendectomy 5/10  . Esophagogastroduodenoscopy 05/2006    H.pylori neg  . Inguinal hernia repair     child  . Esophagogastroduodenoscopy 03/23/2008    Fields-gastritis benign esophageal polyp, otherwise normal. Negative H. pylori he (propofol)  . Incisional hernia repair 03/04/2012    Procedure: LAPAROSCOPIC INCISIONAL HERNIA;  Surgeon: Dalia Heading, MD;  Location: [facility];  Service: General;  Laterality: N/A;  repair, recurrent  . Insertion of mesh 03/04/2012    Procedure: INSERTION OF MESH;  Surgeon: Dalia Heading, MD;  Location: [facility];  Service: General;  Laterality: N/A;    Family History  Problem Relation Age of Onset  . Colon cancer Neg Hx     History  Substance Use Topics  . Smoking status: Current Every Day Smoker -- 1.0 packs/day for 15 years    Types: Cigarettes  . Smokeless tobacco: Never Used     Comment: Less than 1/2 pk a day. trying to quit.  03/04/12 1255 states "was doing good, but now up to about a pack per day"  . Alcohol Use: No      Review of Systems  All other systems reviewed and  are negative.    Allergies  Dexilant; Propoxyphene-acetaminophen; Zofran; and Zofran odt  Home Medications   Current Outpatient Rx  Name  Route  Sig  Dispense  Refill  . ALBUTEROL SULFATE HFA 108 (90 BASE) MCG/ACT IN AERS   Inhalation   Inhale 2 puffs into the lungs every 6 (six) hours as needed. Shortness of breath          . ALPRAZOLAM 1 MG PO TABS   Oral   Take 1 mg by mouth 4 (four) times daily.           Marland Kitchen CARBAMAZEPINE ER 100 MG PO TB12   Oral   Take 100 mg by mouth 4 (four) times daily.         Marland Kitchen CITALOPRAM HYDROBROMIDE 40 MG PO TABS   Oral   Take 40 mg by mouth daily.         Marland Kitchen DIPHENHYDRAMINE HCL 25 MG PO TABS   Oral   Take  25 mg by mouth every 6 (six) hours as needed. For allergies         . LISINOPRIL-HYDROCHLOROTHIAZIDE 20-25 MG PO TABS   Oral   Take 1 tablet by mouth daily.           Marland Kitchen OMEPRAZOLE 40 MG PO CPDR   Oral   Take 1 capsule (40 mg total) by mouth 2 (two) times daily.   60 capsule   5   . OXYCODONE-ACETAMINOPHEN 7.5-325 MG PO TABS   Oral   Take 1 tablet by mouth every 4 (four) hours as needed. For pain         . PROMETHAZINE HCL 25 MG PO TABS   Oral   Take 1 tablet (25 mg total) by mouth every 6 (six) hours as needed for nausea.   15 tablet   0   . TOPIRAMATE 100 MG PO TABS   Oral   Take 100 mg by mouth 2 (two) times daily.          . TRAZODONE HCL 100 MG PO TABS   Oral   Take 100 mg by mouth at bedtime as needed. For sleep           BP 137/87  Pulse 77  Temp 98.2 F (36.8 C) (Oral)  Resp 20  Ht 5\' 9"  (1.753 m)  Wt 280 lb (127.007 kg)  BMI 41.35 kg/m2  SpO2 99%  Physical Exam  Nursing note and vitals reviewed. Constitutional: He is oriented to person, place, and time. He appears well-developed and well-nourished. No distress.  HENT:  Head: Normocephalic and atraumatic.  Eyes: Conjunctivae normal are normal.  Cardiovascular: Normal rate, regular rhythm and normal heart sounds.   No murmur heard. Pulmonary/Chest: Effort normal. No respiratory distress. He exhibits tenderness (mild left anterior lateral rib cage).  Abdominal: Soft. He exhibits no distension. There is no tenderness.  Musculoskeletal: He exhibits edema (+1 pitting ).  Neurological: He is alert and oriented to person, place, and time.  Skin: Skin is warm and dry.  Psychiatric: He has a normal mood and affect. His behavior is normal.    ED Course  Procedures (including critical care time) DIAGNOSTIC STUDIES: Oxygen Saturation is 99% on room air, normal by my interpretation.    COORDINATION OF CARE: 7:54 PM Discussed ED treatment with pt  7:56 PM Ordered:  Medications   oxyCODONE-acetaminophen (PERCOCET) 7.5-325 MG per tablet (not administered)  ibuprofen (ADVIL,MOTRIN) tablet 800 mg (not administered)  HYDROcodone-acetaminophen (NORCO/VICODIN) 5-325 MG per  tablet 1 tablet (not administered)     Dg Ribs Unilateral W/chest Left  04/20/2012  *RADIOLOGY REPORT*  Clinical Data: Left-sided chest pain following fall  LEFT RIBS AND CHEST - 3+ VIEW  Comparison: None.  Findings: The heart and pulmonary vascularity are within normal limits.  The lungs are well aerated without pneumothorax or focal infiltrate.  No acute rib fracture is noted.  IMPRESSION: No acute abnormalities seen.   Original Report Authenticated By: Alcide Clever, M.D.      1. Chest wall muscle strain       MDM  Chest wall pain. He will be sent for x-ray to see whether it is rib fracture versus muscle strain.  X-rays are negative for fracture. He had not gotten relief with Percocet at home, she was given Norco here which did seem to give her better relief. He is sent home with a prescription for Norco.   I personally performed the services described in this documentation, which was scribed in my presence. The recorded information has been reviewed and is accurate.      Jeffrey Booze, MD 04/20/12 [phone]

## 2012-04-20 NOTE — ED Notes (Signed)
Pt with left chest pain, hx of reflux; fell yesterday, denies hitting head, hit tailbone after fall, left flank pain as well, recent surgery to repair 4 hernias per pt in October

## 2012-04-20 NOTE — ED Notes (Signed)
Pt called for triage without response.

## 2012-05-16 ENCOUNTER — Emergency Department (HOSPITAL_COMMUNITY): Payer: Medicaid Other

## 2012-05-16 ENCOUNTER — Encounter (HOSPITAL_COMMUNITY): Payer: Self-pay | Admitting: *Deleted

## 2012-05-16 ENCOUNTER — Emergency Department (HOSPITAL_COMMUNITY)
Admission: EM | Admit: 2012-05-16 | Discharge: 2012-05-16 | Disposition: A | Discharge status: Home / Self Care | Payer: Medicaid Other | Attending: Emergency Medicine | Admitting: Emergency Medicine

## 2012-05-16 DIAGNOSIS — R112 Nausea with vomiting, unspecified: Secondary | ICD-10-CM | POA: Insufficient documentation

## 2012-05-16 DIAGNOSIS — I10 Essential (primary) hypertension: Secondary | ICD-10-CM | POA: Insufficient documentation

## 2012-05-16 DIAGNOSIS — G8929 Other chronic pain: Secondary | ICD-10-CM

## 2012-05-16 DIAGNOSIS — Z8709 Personal history of other diseases of the respiratory system: Secondary | ICD-10-CM | POA: Insufficient documentation

## 2012-05-16 DIAGNOSIS — Z79899 Other long term (current) drug therapy: Secondary | ICD-10-CM | POA: Insufficient documentation

## 2012-05-16 DIAGNOSIS — Y939 Activity, unspecified: Secondary | ICD-10-CM | POA: Insufficient documentation

## 2012-05-16 DIAGNOSIS — Z8659 Personal history of other mental and behavioral disorders: Secondary | ICD-10-CM | POA: Insufficient documentation

## 2012-05-16 DIAGNOSIS — Y929 Unspecified place or not applicable: Secondary | ICD-10-CM | POA: Insufficient documentation

## 2012-05-16 DIAGNOSIS — K219 Gastro-esophageal reflux disease without esophagitis: Secondary | ICD-10-CM | POA: Insufficient documentation

## 2012-05-16 DIAGNOSIS — F17200 Nicotine dependence, unspecified, uncomplicated: Secondary | ICD-10-CM | POA: Insufficient documentation

## 2012-05-16 DIAGNOSIS — G4733 Obstructive sleep apnea (adult) (pediatric): Secondary | ICD-10-CM | POA: Insufficient documentation

## 2012-05-16 DIAGNOSIS — W108XXA Fall (on) (from) other stairs and steps, initial encounter: Secondary | ICD-10-CM | POA: Insufficient documentation

## 2012-05-16 LAB — CBC WITH DIFFERENTIAL/PLATELET
Eosinophils Relative: 2 % (ref 0–5)
HCT: 44 % (ref 39.0–52.0)
Hemoglobin: 14.9 g/dL (ref 13.0–17.0)
Lymphocytes Relative: 28 % (ref 12–46)
Lymphs Abs: 1.8 10*3/uL (ref 0.7–4.0)
MCV: 92.6 fL (ref 78.0–100.0)
Monocytes Absolute: 0.4 10*3/uL (ref 0.1–1.0)
Monocytes Relative: 7 % (ref 3–12)
Neutro Abs: 4 10*3/uL (ref 1.7–7.7)
WBC: 6.3 10*3/uL (ref 4.0–10.5)

## 2012-05-16 LAB — COMPREHENSIVE METABOLIC PANEL
AST: 14 U/L (ref 0–37)
BUN: 6 mg/dL (ref 6–23)
CO2: 28 mEq/L (ref 19–32)
Calcium: 9.7 mg/dL (ref 8.4–10.5)
Chloride: 102 mEq/L (ref 96–112)
Creatinine, Ser: 0.92 mg/dL (ref 0.50–1.35)
GFR calc Af Amer: >90 mL/min (ref >90)
GFR calc non Af Amer: >90 mL/min (ref >90)
Glucose, Bld: 120 mg/dL — ABNORMAL HIGH (ref 70–99)
Total Bilirubin: 0.5 mg/dL (ref 0.3–1.2)

## 2012-05-16 LAB — TROPONIN I: Troponin I: <0.3 ng/mL (ref <0.30)

## 2012-05-16 MED ORDER — METOCLOPRAMIDE HCL 5 MG/ML IJ SOLN
10.0000 mg | Freq: Once | INTRAMUSCULAR | Status: AC
  Administered 2012-05-16: 10 mg via INTRAVENOUS
  Filled 2012-05-16: qty 2

## 2012-05-16 MED ORDER — PANTOPRAZOLE SODIUM 40 MG IV SOLR
40.0000 mg | Freq: Once | INTRAVENOUS | Status: AC
  Administered 2012-05-16: 40 mg via INTRAVENOUS
  Filled 2012-05-16: qty 40

## 2012-05-16 MED ORDER — SODIUM CHLORIDE 0.9 % IV BOLUS (SEPSIS)
500.0000 mL | Freq: Once | INTRAVENOUS | Status: AC
  Administered 2012-05-16: 500 mL via INTRAVENOUS

## 2012-05-16 NOTE — ED Notes (Signed)
Pt co chest pain that comes and goes radiates to lt arm with numbness, no chest pain at the moment just "sore", pt feels pain in back from fall 4 days prior.

## 2012-05-16 NOTE — ED Notes (Signed)
Patient walk to restroom tolerated well.

## 2012-05-16 NOTE — ED Provider Notes (Signed)
History   This chart was scribed for Jeffrey Quarry, MD by Leone Payor, ED Scribe. This patient was seen in room APA07/[facility]07 and the patient's care was started at 1753  CSN: [id number]  Arrival date & time 05/16/12  1729   First MD Initiated Contact with Patient 05/16/12 1753      Chief Complaint  Patient presents with  . Fall    4 days prior, lower back pain  . Chest Pain  . Back Pain     The history is provided by the patient. No language interpreter was used.    [name] is a 32 y.o. male who presents to the Emergency Department complaining of new, unchanged, moderate chest pain and lower back pain after a fall that occurred 5 days ago. Pt states he fell down some steps. He reports the chest pain is worsened with deep breathing and palpation and relieved by nothing. He is positive for nausea, vomiting (x8-9 today) that has been going on from the acid reflux problems that pt is seen at [facility] for. He denies fever, chills, LOC, vision changes, congestion, sneezing, coughing.    Pt has h/o GERD, HTN, chronic abdominal pain and vomiting.  Pt is a current everyday smoker and denies alcohol use.  Past Medical History  Diagnosis Date  . GERD (gastroesophageal reflux disease)     Bravo study Nov 2009 on Prilosec 40 BID with adequate acid suppression  . Hypertension   . Anxiety   . Depression     increased over past several months  . Chronic abdominal pain     HIDA scan Sept 2010, EF 93%, s/p chole oct 2010; evaluated at [facility] for abdominal wall pain; Imipramine added May 2011  . Chronic vomiting     normal GES 2009  . S/P endoscopy     2008 Dr. Karilyn Cota: erosive reflux esophagitis, antral gastritis, H.pylori serologies neg, Nov 2009 Dr. Darrick Penna: gastritis, benign esophageal polyp, no H.pylori,  Feb 2011: [facility], Dr. Bubba Hales: normal esophagus, normal stomach, normal duodenum, path unremarkable  . Ventral hernia   . Shortness of breath     SOB even with no exertion  . Sleep  apnea     supposed to sleep with CPAP    Past Surgical History  Procedure Date  . Ventral hernia repair March 2012    Dr. Lovell Sheehan  . Knee surgery 2009  . Cholecystectomy 10/10  . Appendectomy 5/10  . Esophagogastroduodenoscopy 05/2006    H.pylori neg  . Inguinal hernia repair     child  . Esophagogastroduodenoscopy 03/23/2008    Fields-gastritis benign esophageal polyp, otherwise normal. Negative H. pylori he (propofol)  . Incisional hernia repair 03/04/2012    Procedure: LAPAROSCOPIC INCISIONAL HERNIA;  Surgeon: Dalia Heading, MD;  Location: [facility];  Service: General;  Laterality: N/A;  repair, recurrent  . Insertion of mesh 03/04/2012    Procedure: INSERTION OF MESH;  Surgeon: Dalia Heading, MD;  Location: [facility];  Service: General;  Laterality: N/A;    Family History  Problem Relation Age of Onset  . Colon cancer Neg Hx     History  Substance Use Topics  . Smoking status: Current Every Day Smoker -- 1.0 packs/day for 15 years    Types: Cigarettes  . Smokeless tobacco: Never Used     Comment: Less than 1/2 pk a day. trying to quit.  03/04/12 1255 states "was doing good, but now up to about a pack per day"  . Alcohol  Use: No      Review of Systems  Constitutional: Negative for fever and chills.  HENT: Negative for congestion and sneezing.   Eyes: Negative for visual disturbance.  Respiratory: Negative for cough.   Cardiovascular: Positive for chest pain.  Gastrointestinal: Positive for nausea and vomiting.  Musculoskeletal: Positive for back pain (lower).  All other systems reviewed and are negative.    Allergies  Dexilant; Propoxyphene-acetaminophen; Zofran; and Zofran odt  Home Medications   Current Outpatient Rx  Name  Route  Sig  Dispense  Refill  . ALBUTEROL SULFATE HFA 108 (90 BASE) MCG/ACT IN AERS   Inhalation   Inhale 2 puffs into the lungs every 6 (six) hours as needed. Shortness of breath          . ALPRAZOLAM 1 MG PO TABS   Oral   Take 1  mg by mouth 4 (four) times daily.           Marland Kitchen CARBAMAZEPINE ER 100 MG PO TB12   Oral   Take 100 mg by mouth 4 (four) times daily.         Marland Kitchen CITALOPRAM HYDROBROMIDE 40 MG PO TABS   Oral   Take 40 mg by mouth daily.         Marland Kitchen DIPHENHYDRAMINE HCL 25 MG PO TABS   Oral   Take 25 mg by mouth every 6 (six) hours as needed. For allergies         . HYDROCODONE-ACETAMINOPHEN 5-325 MG PO TABS   Oral   Take 1 tablet by mouth every 4 (four) hours as needed for pain.   20 tablet   0   . LISINOPRIL-HYDROCHLOROTHIAZIDE 20-25 MG PO TABS   Oral   Take 1 tablet by mouth daily.           Marland Kitchen OMEPRAZOLE 40 MG PO CPDR   Oral   Take 1 capsule (40 mg total) by mouth 2 (two) times daily.   60 capsule   5   . OXYCODONE-ACETAMINOPHEN 7.5-325 MG PO TABS   Oral   Take 1 tablet by mouth every 4 (four) hours as needed. For pain         . PROMETHAZINE HCL 25 MG PO TABS   Oral   Take 1 tablet (25 mg total) by mouth every 6 (six) hours as needed for nausea.   15 tablet   0   . TOPIRAMATE 100 MG PO TABS   Oral   Take 100 mg by mouth 2 (two) times daily.          . TRAZODONE HCL 100 MG PO TABS   Oral   Take 100 mg by mouth at bedtime as needed. For sleep           BP 130/76  Pulse 90  Temp 98.2 F (36.8 C) (Oral)  Resp 18  Ht 5\' 9"  (1.753 m)  Wt 280 lb (127.007 kg)  BMI 41.35 kg/m2  SpO2 97%  Physical Exam  Nursing note and vitals reviewed. Constitutional:       Awake, alert, nontoxic appearance with baseline speech for patient.  HENT:  Head: Atraumatic.  Mouth/Throat: No oropharyngeal exudate.       No contusions noted.  Eyes: EOM are normal. Pupils are equal, round, and reactive to light. Right eye exhibits no discharge. Left eye exhibits no discharge.  Neck: Neck supple.       No step offs noted  Cardiovascular: Normal rate and regular rhythm.  No murmur heard. Pulmonary/Chest: Effort normal and breath sounds normal. No stridor. No respiratory distress. He has  no wheezes. He has no rales. He exhibits no tenderness.  Abdominal: Soft. Bowel sounds are normal. He exhibits no mass. There is no tenderness. There is no rebound.  Musculoskeletal: He exhibits no tenderness.       Diffuse tenderness along thoracolumbar spine area with peri-lumbar and peri-thoracic tenderness.      Lymphadenopathy:    He has no cervical adenopathy.  Neurological:       Awake, alert, cooperative and aware of situation; motor strength bilaterally; sensation normal to light touch bilaterally; peripheral visual fields full to confrontation; no facial asymmetry; tongue midline; major cranial nerves appear intact; no pronator drift, normal finger to nose bilaterally, baseline gait without new ataxia.  Skin: No rash noted.  Psychiatric: He has a normal mood and affect.    ED Course  Procedures (including critical care time) DIAGNOSTIC STUDIES: Oxygen Saturation is 97% on room air, adequate by my interpretation.    COORDINATION OF CARE:  6:19 PM Discussed treatment plan which includes CXR, lumbar spine X-Galaxy [place], CBC panel, comprehensive metabolic panel, troponin I with pt at bedside and pt agreed to plan.    Labs Reviewed  COMPREHENSIVE METABOLIC PANEL - Abnormal; Notable for the following:    Glucose, Bld 120 (*)     All other components within normal limits  TROPONIN I  CBC WITH DIFFERENTIAL  LIPASE, BLOOD   Dg Chest 2 View  05/16/2012  *RADIOLOGY REPORT*  Clinical Data: 32 year old male status post fall with pain.  CHEST - 2 VIEW  Comparison: 04/20/2012 and earlier.  Findings: Stable lung volumes.  Stable mild cardiomegaly. Other mediastinal contours are within normal limits.  Visualized tracheal air column is within normal limits.  No pneumothorax, pulmonary edema, pleural effusion or acute pulmonary opacity. No acute osseous abnormality identified.  IMPRESSION: No acute cardiopulmonary abnormality or acute traumatic injury identified.   Original Report Authenticated By: Erskine Speed, M.D.    Dg Lumbar Spine Complete  05/16/2012  *RADIOLOGY REPORT*  Clinical Data: 32 year old male status post fall with pain.  LUMBAR SPINE - COMPLETE 4+ VIEW  Comparison: CT abdomen pelvis 02/25/2012 and earlier.  Findings: Sequelae of ventral abdominal hernia repair.  Normal lumbar segmentation.  Mild anterior wedging of T12 and L1 appears stable and chronic / congenital.  Chronic bilateral L4 and L5 pars fractures re-identified.  Stable vertebral height and alignment with no interval spondylolisthesis.  Sacral ala and SI joints appear within normal limits.  Right upper quadrant surgical clips.  IMPRESSION: No acute osseous abnormality in the lumbar spine.  Chronic L4 and L5 pars fractures.   Original Report Authenticated By: Erskine Speed, M.D.      No diagnosis found.   Date: 05/16/2012  Rate: 84  Rhythm: normal sinus rhythm  QRS Axis: normal  Intervals: normal  ST/T Wave abnormalities: normal  Conduction Disutrbances:none  Narrative Interpretation:   Old EKG Reviewed: decreased by 8 bpm    MDM   Results for orders placed during the hospital encounter of 05/16/12  TROPONIN I      Component Value Range   Troponin I <0.30  <0.30 ng/mL  CBC WITH DIFFERENTIAL      Component Value Range   WBC 6.3  4.0 - 10.5 K/uL   RBC 4.75  4.22 - 5.81 MIL/uL   Hemoglobin 14.9  13.0 - 17.0 g/dL   HCT 16.1  09.6 - 04.5 %  MCV 92.6  78.0 - 100.0 fL   MCH 31.4  26.0 - 34.0 pg   MCHC 33.9  30.0 - 36.0 g/dL   RDW 16.1  09.6 - 04.5 %   Platelets 210  150 - 400 K/uL   Neutrophils Relative 63  43 - 77 %   Neutro Abs 4.0  1.7 - 7.7 K/uL   Lymphocytes Relative 28  12 - 46 %   Lymphs Abs 1.8  0.7 - 4.0 K/uL   Monocytes Relative 7  3 - 12 %   Monocytes Absolute 0.4  0.1 - 1.0 K/uL   Eosinophils Relative 2  0 - 5 %   Eosinophils Absolute 0.1  0.0 - 0.7 K/uL   Basophils Relative 0  0 - 1 %   Basophils Absolute 0.0  0.0 - 0.1 K/uL  COMPREHENSIVE METABOLIC PANEL      Component Value Range    Sodium 137  135 - 145 mEq/L   Potassium 3.6  3.5 - 5.1 mEq/L   Chloride 102  96 - 112 mEq/L   CO2 28  19 - 32 mEq/L   Glucose, Bld 120 (*) 70 - 99 mg/dL   BUN 6  6 - 23 mg/dL   Creatinine, Ser 4.09  0.50 - 1.35 mg/dL   Calcium 9.7  8.4 - 81.1 mg/dL   Total Protein 7.2  6.0 - 8.3 g/dL   Albumin 3.9  3.5 - 5.2 g/dL   AST 14  0 - 37 U/L   ALT 18  0 - 53 U/L   Alkaline Phosphatase 92  39 - 117 U/L   Total Bilirubin 0.5  0.3 - 1.2 mg/dL   GFR calc non Af Amer >90  >90 mL/min   GFR calc Af Amer >90  >90 mL/min  LIPASE, BLOOD      Component Value Range   Lipase 14  11 - 59 U/L    Patient with chronic abdominal and back pain.  No acute findings here.    Jeffrey Quarry, MD 05/16/12 [phone]

## 2012-05-16 NOTE — ED Notes (Signed)
Pt alert & oriented x4, stable gait. Patient given discharge instructions, paperwork & prescription(s). Patient  instructed to stop at the registration desk to finish any additional paperwork. Patient verbalized understanding. Pt left department w/ no further questions. 

## 2012-08-11 ENCOUNTER — Emergency Department (HOSPITAL_COMMUNITY): Payer: Medicaid Other

## 2012-08-11 ENCOUNTER — Emergency Department (HOSPITAL_COMMUNITY)
Admission: EM | Admit: 2012-08-11 | Discharge: 2012-08-11 | Disposition: A | Discharge status: Home / Self Care | Payer: Medicaid Other | Attending: Emergency Medicine | Admitting: Emergency Medicine

## 2012-08-11 ENCOUNTER — Encounter (HOSPITAL_COMMUNITY): Payer: Self-pay | Admitting: *Deleted

## 2012-08-11 DIAGNOSIS — Z79899 Other long term (current) drug therapy: Secondary | ICD-10-CM | POA: Insufficient documentation

## 2012-08-11 DIAGNOSIS — F329 Major depressive disorder, single episode, unspecified: Secondary | ICD-10-CM | POA: Insufficient documentation

## 2012-08-11 DIAGNOSIS — F3289 Other specified depressive episodes: Secondary | ICD-10-CM | POA: Insufficient documentation

## 2012-08-11 DIAGNOSIS — G4730 Sleep apnea, unspecified: Secondary | ICD-10-CM | POA: Insufficient documentation

## 2012-08-11 DIAGNOSIS — K219 Gastro-esophageal reflux disease without esophagitis: Secondary | ICD-10-CM | POA: Insufficient documentation

## 2012-08-11 DIAGNOSIS — W230XXA Caught, crushed, jammed, or pinched between moving objects, initial encounter: Secondary | ICD-10-CM | POA: Insufficient documentation

## 2012-08-11 DIAGNOSIS — Z9981 Dependence on supplemental oxygen: Secondary | ICD-10-CM | POA: Insufficient documentation

## 2012-08-11 DIAGNOSIS — I10 Essential (primary) hypertension: Secondary | ICD-10-CM | POA: Insufficient documentation

## 2012-08-11 DIAGNOSIS — X500XXA Overexertion from strenuous movement or load, initial encounter: Secondary | ICD-10-CM | POA: Insufficient documentation

## 2012-08-11 DIAGNOSIS — S8010XA Contusion of unspecified lower leg, initial encounter: Secondary | ICD-10-CM | POA: Insufficient documentation

## 2012-08-11 DIAGNOSIS — Z8719 Personal history of other diseases of the digestive system: Secondary | ICD-10-CM | POA: Insufficient documentation

## 2012-08-11 DIAGNOSIS — S8011XA Contusion of right lower leg, initial encounter: Secondary | ICD-10-CM

## 2012-08-11 DIAGNOSIS — F17200 Nicotine dependence, unspecified, uncomplicated: Secondary | ICD-10-CM | POA: Insufficient documentation

## 2012-08-11 DIAGNOSIS — Z8709 Personal history of other diseases of the respiratory system: Secondary | ICD-10-CM | POA: Insufficient documentation

## 2012-08-11 DIAGNOSIS — F411 Generalized anxiety disorder: Secondary | ICD-10-CM | POA: Insufficient documentation

## 2012-08-11 DIAGNOSIS — Y9289 Other specified places as the place of occurrence of the external cause: Secondary | ICD-10-CM | POA: Insufficient documentation

## 2012-08-11 DIAGNOSIS — Z9889 Other specified postprocedural states: Secondary | ICD-10-CM | POA: Insufficient documentation

## 2012-08-11 DIAGNOSIS — Y939 Activity, unspecified: Secondary | ICD-10-CM | POA: Insufficient documentation

## 2012-08-11 HISTORY — DX: Diaphragmatic hernia without obstruction or gangrene: K44.9

## 2012-08-11 MED ORDER — TRAMADOL HCL 50 MG PO TABS: 50.0000 mg | ORAL_TABLET | Freq: Four times a day (QID) | ORAL | Status: DC | PRN

## 2012-08-11 MED ORDER — HYDROCODONE-ACETAMINOPHEN 5-325 MG PO TABS
2.0000 | ORAL_TABLET | Freq: Once | ORAL | Status: AC
  Administered 2012-08-11: 2 via ORAL
  Filled 2012-08-11: qty 2

## 2012-08-11 MED ORDER — IBUPROFEN 800 MG PO TABS: 800.0000 mg | ORAL_TABLET | Freq: Three times a day (TID) | ORAL | Status: DC

## 2012-08-11 NOTE — ED Notes (Addendum)
Pt c/o lft leg pain directly above the ankle. Pt tripped Sun and "twisted his leg". No bruising/edema noted.

## 2012-08-11 NOTE — ED Provider Notes (Signed)
History     CSN: [id number]  Arrival date & time 08/11/12  [id number]   First MD Initiated Contact with Patient 08/11/12 [phone]      Chief Complaint  Patient presents with  . Leg Pain    (Consider location/radiation/quality/duration/timing/severity/associated sxs/prior treatment) Patient is a 32 y.o. male presenting with ankle pain. The history is provided by the patient.  Ankle Pain Location:  Leg and ankle Time since incident:  2 days Injury: yes   Mechanism of injury comment:  Tripped, torsion of the right ankle.  Struck right lower leg on edge of a flower bed Leg location:  R lower leg Ankle location:  R ankle Pain details:    Quality:  Aching and throbbing   Radiates to:  Does not radiate   Severity:  Mild   Onset quality:  Sudden   Timing:  Constant   Progression:  Worsening Chronicity:  New Dislocation: no   Foreign body present:  No foreign bodies Prior injury to area:  No Relieved by:  Rest Worsened by:  Activity, bearing weight and rotation Ineffective treatments:  None tried Associated symptoms: no back pain, no decreased ROM, no fever, no itching, no neck pain, no numbness, no stiffness, no swelling and no tingling     Past Medical History  Diagnosis Date  . GERD (gastroesophageal reflux disease)     Bravo study Nov 2009 on Prilosec 40 BID with adequate acid suppression  . Hypertension   . Anxiety   . Depression     increased over past several months  . Chronic abdominal pain     HIDA scan Sept 2010, EF 93%, s/p chole oct 2010; evaluated at [facility] for abdominal wall pain; Imipramine added May 2011  . Chronic vomiting     normal GES 2009  . S/P endoscopy     2008 Dr. Karilyn Cota: erosive reflux esophagitis, antral gastritis, H.pylori serologies neg, Nov 2009 Dr. Darrick Penna: gastritis, benign esophageal polyp, no H.pylori,  Feb 2011: [facility], Dr. Bubba Hales: normal esophagus, normal stomach, normal duodenum, path unremarkable  . Ventral hernia   . Shortness of breath    SOB even with no exertion  . Sleep apnea     supposed to sleep with CPAP  . Hiatal hernia     Past Surgical History  Procedure Laterality Date  . Ventral hernia repair  March 2012    Dr. Lovell Sheehan  . Knee surgery  2009  . Cholecystectomy  10/10  . Appendectomy  5/10  . Esophagogastroduodenoscopy  05/2006    H.pylori neg  . Inguinal hernia repair      child  . Esophagogastroduodenoscopy  03/23/2008    Fields-gastritis benign esophageal polyp, otherwise normal. Negative H. pylori he (propofol)  . Incisional hernia repair  03/04/2012    Procedure: LAPAROSCOPIC INCISIONAL HERNIA;  Surgeon: Dalia Heading, MD;  Location: [facility];  Service: General;  Laterality: N/A;  repair, recurrent  . Insertion of mesh  03/04/2012    Procedure: INSERTION OF MESH;  Surgeon: Dalia Heading, MD;  Location: [facility];  Service: General;  Laterality: N/A;    Family History  Problem Relation Age of Onset  . Colon cancer Neg Hx     History  Substance Use Topics  . Smoking status: Current Every Day Smoker -- 1.00 packs/day for 15 years    Types: Cigarettes  . Smokeless tobacco: Never Used     Comment: Less than 1/2 pk a day. trying to quit.  03/04/12 1255 states "was doing  good, but now up to about a pack per day"  . Alcohol Use: No      Review of Systems  Constitutional: Negative for fever and chills.  HENT: Negative for neck pain.   Genitourinary: Negative for dysuria and difficulty urinating.  Musculoskeletal: Positive for arthralgias. Negative for back pain, joint swelling and stiffness.  Skin: Negative for color change, itching and wound.  All other systems reviewed and are negative.    Allergies  Dexilant; Propoxyphene-acetaminophen; Zofran; and Zofran odt  Home Medications   Current Outpatient Rx  Name  Route  Sig  Dispense  Refill  . albuterol (PROVENTIL HFA;VENTOLIN HFA) 108 (90 BASE) MCG/ACT inhaler   Inhalation   Inhale 2 puffs into the lungs every 6 (six) hours as needed.  Shortness of breath          . ALPRAZolam (XANAX) 1 MG tablet   Oral   Take 1 mg by mouth 4 (four) times daily.           . diphenhydrAMINE (BENADRYL) 25 MG tablet   Oral   Take 25 mg by mouth every 6 (six) hours as needed. For allergies         . lisinopril-hydrochlorothiazide (PRINZIDE,ZESTORETIC) 20-25 MG per tablet   Oral   Take 1 tablet by mouth daily.           Marland Kitchen omeprazole (PRILOSEC) 40 MG capsule   Oral   Take 1 capsule (40 mg total) by mouth 2 (two) times daily.   60 capsule   5   . oxyCODONE-acetaminophen (PERCOCET) 7.5-325 MG per tablet   Oral   Take 1 tablet by mouth every 4 (four) hours as needed. For pain         . promethazine (PHENERGAN) 25 MG tablet   Oral   Take 1 tablet (25 mg total) by mouth every 6 (six) hours as needed for nausea.   15 tablet   0   . traZODone (DESYREL) 100 MG tablet   Oral   Take 100 mg by mouth at bedtime as needed. For sleep           BP 144/84  Pulse 87  Temp(Src) 98.2 F (36.8 C) (Oral)  Resp 15  Ht 5\' 9"  (1.753 m)  Wt 290 lb (131.543 kg)  BMI 42.81 kg/m2  SpO2 98%  Physical Exam  Nursing note and vitals reviewed. Constitutional: He is oriented to person, place, and time. He appears well-developed and well-nourished. No distress.  HENT:  Head: Normocephalic and atraumatic.  Cardiovascular: Normal rate, regular rhythm, normal heart sounds and intact distal pulses.   No murmur heard. Pulmonary/Chest: Effort normal and breath sounds normal. No respiratory distress.  Musculoskeletal: He exhibits tenderness.  Right medial ankle is ttp, mild STS is present.  ROM is preserved. Distal right tib/fib also ttp with slight bruising also present.  DP pulse is brisk, distal sensation intact.  No erythema, abrasion, or bony deformity.  Posterior calf is NT.  no proximal tenderness  Neurological: He is alert and oriented to person, place, and time. He exhibits normal muscle tone. Coordination normal.  Skin: Skin is warm  and dry.    ED Course  Procedures (including critical care time)  Labs Reviewed - No data to display Dg Tibia/fibula Right  08/11/2012  *RADIOLOGY REPORT*  Clinical Data: Right lower leg pain.  Twisting injury 2 days ago.  RIGHT TIBIA AND FIBULA - 2 VIEW  Comparison: Right ankle x-rays obtained concurrently.  Findings: No  evidence of acute or subacute fracture or dislocation. Well-preserved bone mineral density.  Small exostosis arising from the medial aspect of the proximal fibula.  No significant intrinsic osseous abnormality.  Visualized knee joint intact.  IMPRESSION: No acute or subacute osseous abnormality.  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   Original Report Authenticated By: Hulan Saas, M.D.    Dg Ankle Complete Right  08/11/2012  *RADIOLOGY REPORT*  Clinical Data: Twisting injury 2 days ago.  Pain.  RIGHT ANKLE - COMPLETE 3+ VIEW  Comparison: None.  Findings: No acute bony or joint abnormality is identified. Prominent os trigonum is noted.  Soft tissue structures are unremarkable.  IMPRESSION: No acute abnormality.   Original Report Authenticated By: Holley Dexter, M.D.    ASO splint applied and crutches given.  Pain improved, remains NV intact     MDM    X-ray results were discussed with the pt. Including the osteochondroma at the proximal fibula.  I will give pt ASO and crutches.  He requests a referral to Dr. Sanjuan Dame office.    Prescribed ibuprofen and norco #20   The patient appears reasonably screened and/or stabilized for discharge and I doubt any other medical condition or other [facility] requiring further screening, evaluation, or treatment in the ED at this time prior to discharge.   Kyree Adriano L. Trisha Mangle, PA-C 08/11/12 1954

## 2012-08-12 NOTE — ED Provider Notes (Signed)
Medical screening examination/treatment/procedure(s) were performed by non-physician practitioner and as supervising physician I was immediately available for consultation/collaboration.  Alainna Stawicki, MD 08/12/12 0723 

## 2012-09-21 ENCOUNTER — Emergency Department (HOSPITAL_COMMUNITY): Payer: Medicaid Other

## 2012-09-21 ENCOUNTER — Emergency Department (HOSPITAL_COMMUNITY)
Admission: EM | Admit: 2012-09-21 | Discharge: 2012-09-21 | Disposition: A | Discharge status: Home / Self Care | Payer: Medicaid Other | Attending: Emergency Medicine | Admitting: Emergency Medicine

## 2012-09-21 ENCOUNTER — Encounter (HOSPITAL_COMMUNITY): Payer: Self-pay | Admitting: *Deleted

## 2012-09-21 DIAGNOSIS — Z79899 Other long term (current) drug therapy: Secondary | ICD-10-CM | POA: Insufficient documentation

## 2012-09-21 DIAGNOSIS — F3289 Other specified depressive episodes: Secondary | ICD-10-CM | POA: Insufficient documentation

## 2012-09-21 DIAGNOSIS — F329 Major depressive disorder, single episode, unspecified: Secondary | ICD-10-CM | POA: Insufficient documentation

## 2012-09-21 DIAGNOSIS — K219 Gastro-esophageal reflux disease without esophagitis: Secondary | ICD-10-CM | POA: Insufficient documentation

## 2012-09-21 DIAGNOSIS — F17200 Nicotine dependence, unspecified, uncomplicated: Secondary | ICD-10-CM | POA: Insufficient documentation

## 2012-09-21 DIAGNOSIS — R112 Nausea with vomiting, unspecified: Secondary | ICD-10-CM | POA: Insufficient documentation

## 2012-09-21 DIAGNOSIS — R109 Unspecified abdominal pain: Secondary | ICD-10-CM | POA: Insufficient documentation

## 2012-09-21 DIAGNOSIS — F411 Generalized anxiety disorder: Secondary | ICD-10-CM | POA: Insufficient documentation

## 2012-09-21 DIAGNOSIS — I10 Essential (primary) hypertension: Secondary | ICD-10-CM | POA: Insufficient documentation

## 2012-09-21 LAB — CBC WITH DIFFERENTIAL/PLATELET
Basophils Absolute: 0 10*3/uL (ref 0.0–0.1)
Eosinophils Absolute: 0 10*3/uL (ref 0.0–0.7)
Eosinophils Relative: 0 % (ref 0–5)
Lymphocytes Relative: 18 % (ref 12–46)
MCV: 92.4 fL (ref 78.0–100.0)
Neutrophils Relative %: 75 % (ref 43–77)
Platelets: 299 10*3/uL (ref 150–400)
RDW: 13.1 % (ref 11.5–15.5)
WBC: 13.9 10*3/uL — ABNORMAL HIGH (ref 4.0–10.5)

## 2012-09-21 LAB — COMPREHENSIVE METABOLIC PANEL
ALT: 24 U/L (ref 0–53)
AST: 19 U/L (ref 0–37)
CO2: 24 mEq/L (ref 19–32)
Calcium: 10.3 mg/dL (ref 8.4–10.5)
Potassium: 4 mEq/L (ref 3.5–5.1)
Sodium: 133 mEq/L — ABNORMAL LOW (ref 135–145)
Total Protein: 8.5 g/dL — ABNORMAL HIGH (ref 6.0–8.3)

## 2012-09-21 MED ORDER — IOHEXOL 300 MG/ML  SOLN
100.0000 mL | Freq: Once | INTRAMUSCULAR | Status: AC | PRN
  Administered 2012-09-21: 100 mL via INTRAVENOUS

## 2012-09-21 MED ORDER — DIPHENHYDRAMINE HCL 50 MG/ML IJ SOLN
25.0000 mg | Freq: Once | INTRAMUSCULAR | Status: AC
  Administered 2012-09-21: 25 mg via INTRAVENOUS
  Filled 2012-09-21: qty 1

## 2012-09-21 MED ORDER — PANTOPRAZOLE SODIUM 40 MG IV SOLR
40.0000 mg | Freq: Once | INTRAVENOUS | Status: AC
  Administered 2012-09-21: 40 mg via INTRAVENOUS
  Filled 2012-09-21: qty 40

## 2012-09-21 MED ORDER — PROMETHAZINE HCL 25 MG PO TABS: 25.0000 mg | ORAL_TABLET | Freq: Four times a day (QID) | ORAL | Status: DC | PRN

## 2012-09-21 MED ORDER — OXYCODONE-ACETAMINOPHEN 5-325 MG PO TABS: 1.0000 | ORAL_TABLET | ORAL | Status: DC | PRN

## 2012-09-21 MED ORDER — SODIUM CHLORIDE 0.9 % IV SOLN
1000.0000 mL | INTRAVENOUS | Status: DC
  Administered 2012-09-21: 1000 mL via INTRAVENOUS

## 2012-09-21 MED ORDER — HYDROMORPHONE HCL PF 1 MG/ML IJ SOLN
1.0000 mg | Freq: Once | INTRAMUSCULAR | Status: AC
  Administered 2012-09-21: 1 mg via INTRAVENOUS
  Filled 2012-09-21: qty 1

## 2012-09-21 MED ORDER — PROMETHAZINE HCL 25 MG/ML IJ SOLN
25.0000 mg | Freq: Once | INTRAMUSCULAR | Status: AC
  Administered 2012-09-21: 25 mg via INTRAMUSCULAR
  Filled 2012-09-21: qty 1

## 2012-09-21 MED ORDER — IOHEXOL 300 MG/ML  SOLN
50.0000 mL | Freq: Once | INTRAMUSCULAR | Status: AC | PRN
  Administered 2012-09-21: 50 mL via ORAL

## 2012-09-21 MED ORDER — METOCLOPRAMIDE HCL 5 MG/ML IJ SOLN
10.0000 mg | Freq: Once | INTRAMUSCULAR | Status: AC
  Administered 2012-09-21: 10 mg via INTRAVENOUS
  Filled 2012-09-21: qty 2

## 2012-09-21 MED ORDER — SODIUM CHLORIDE 0.9 % IV SOLN
1000.0000 mL | Freq: Once | INTRAVENOUS | Status: AC
  Administered 2012-09-21: 1000 mL via INTRAVENOUS

## 2012-09-21 NOTE — ED Notes (Signed)
EKG performed in triage

## 2012-09-21 NOTE — ED Provider Notes (Signed)
History     CSN: [id number]  Arrival date & time 09/21/12  1357   First MD Initiated Contact with Patient 09/21/12 1603      Chief Complaint  Patient presents with  . Chest Pain    (Consider location/radiation/quality/duration/timing/severity/associated sxs/prior treatment) Patient is a 32 y.o. male presenting with chest pain. The history is provided by the patient.  Chest Pain He has been treated by a gastroenterologist at [facility] for severe acid reflux. Yesterday, he started having nausea and vomiting and was not able to hold his reflux medicine or antidepressant down. He states that he has been vomiting foamy material. He is also complaining of crampy, spasm-like pain in the lower rib cage on both sides. Pain is severe and he rates it 9/10. Pain does not radiate. He denies dyspnea, fever, diaphoresis. He states that this pain is different from what he usually has with his acid reflux.  Past Medical History  Diagnosis Date  . GERD (gastroesophageal reflux disease)     Bravo study Nov 2009 on Prilosec 40 BID with adequate acid suppression  . Hypertension   . Anxiety   . Depression     increased over past several months  . Chronic abdominal pain     HIDA scan Sept 2010, EF 93%, s/p chole oct 2010; evaluated at [facility] for abdominal wall pain; Imipramine added May 2011  . Chronic vomiting     normal GES 2009  . S/P endoscopy     2008 Dr. Karilyn Cota: erosive reflux esophagitis, antral gastritis, H.pylori serologies neg, Nov 2009 Dr. Darrick Penna: gastritis, benign esophageal polyp, no H.pylori,  Feb 2011: [facility], Dr. Bubba Hales: normal esophagus, normal stomach, normal duodenum, path unremarkable  . Ventral hernia   . Shortness of breath     SOB even with no exertion  . Sleep apnea     supposed to sleep with CPAP  . Hiatal hernia     Past Surgical History  Procedure Laterality Date  . Ventral hernia repair  March 2012    Dr. Lovell Sheehan  . Knee surgery  2009  . Cholecystectomy   10/10  . Appendectomy  5/10  . Esophagogastroduodenoscopy  05/2006    H.pylori neg  . Inguinal hernia repair      child  . Esophagogastroduodenoscopy  03/23/2008    Fields-gastritis benign esophageal polyp, otherwise normal. Negative H. pylori he (propofol)  . Incisional hernia repair  03/04/2012    Procedure: LAPAROSCOPIC INCISIONAL HERNIA;  Surgeon: Dalia Heading, MD;  Location: [facility];  Service: General;  Laterality: N/A;  repair, recurrent  . Insertion of mesh  03/04/2012    Procedure: INSERTION OF MESH;  Surgeon: Dalia Heading, MD;  Location: [facility];  Service: General;  Laterality: N/A;    Family History  Problem Relation Age of Onset  . Colon cancer Neg Hx     History  Substance Use Topics  . Smoking status: Current Every Day Smoker -- 1.00 packs/day for 15 years    Types: Cigarettes  . Smokeless tobacco: Never Used     Comment: Less than 1/2 pk a day. trying to quit.  03/04/12 1255 states "was doing good, but now up to about a pack per day"  . Alcohol Use: No      Review of Systems  Cardiovascular: Positive for chest pain.  All other systems reviewed and are negative.    Allergies  Dexilant; Propoxyphene-acetaminophen; Zofran; and Zofran odt  Home Medications   Current Outpatient Rx  Name  Route  Sig  Dispense  Refill  . albuterol (PROVENTIL HFA;VENTOLIN HFA) 108 (90 BASE) MCG/ACT inhaler   Inhalation   Inhale 2 puffs into the lungs every 6 (six) hours as needed. Shortness of breath          . ALPRAZolam (XANAX) 1 MG tablet   Oral   Take 1 mg by mouth 4 (four) times daily.           . diphenhydrAMINE (BENADRYL) 25 MG tablet   Oral   Take 25 mg by mouth every 6 (six) hours as needed. For allergies         . ibuprofen (ADVIL,MOTRIN) 800 MG tablet   Oral   Take 1 tablet (800 mg total) by mouth 3 (three) times daily.   21 tablet   0   . lisinopril-hydrochlorothiazide (PRINZIDE,ZESTORETIC) 20-25 MG per tablet   Oral   Take 1 tablet by mouth  daily.           Marland Kitchen omeprazole (PRILOSEC) 40 MG capsule   Oral   Take 1 capsule (40 mg total) by mouth 2 (two) times daily.   60 capsule   5   . oxyCODONE-acetaminophen (PERCOCET) 7.5-325 MG per tablet   Oral   Take 1 tablet by mouth every 4 (four) hours as needed. For pain         . traZODone (DESYREL) 100 MG tablet   Oral   Take 100 mg by mouth at bedtime as needed. For sleep           BP 124/73  Pulse 122  Temp(Src) 97.9 F (36.6 C) (Oral)  Resp 24  Ht 5\' 9"  (1.753 m)  Wt 280 lb (127.007 kg)  BMI 41.33 kg/m2  SpO2 97%  Physical Exam  Nursing note and vitals reviewed.  32 year old male, resting comfortably and in no acute distress. Vital signs are significant for tachycardia with heart rate of 122, and tachypnea with respiratory rate of 24. Oxygen saturation is 97%, which is normal. Head is normocephalic and atraumatic. PERRLA, EOMI. Oropharynx is clear. Neck is nontender and supple without adenopathy or JVD. Back is nontender and there is no CVA tenderness. Lungs are clear without rales, wheezes, or rhonchi. Chest is mildly tender along the lower costal margin-right worse than left. Heart has regular rate and rhythm without murmur. Abdomen is soft, flat, nontender without masses or hepatosplenomegaly and peristalsis is hypoactive. Extremities have no cyanosis or edema, full range of motion is present. Skin is warm and dry without rash. Neurologic: Mental status is normal, cranial nerves are intact, there are no motor or sensory deficits.  ED Course  Procedures (including critical care time)  Results for orders placed during the hospital encounter of 09/21/12  CBC WITH DIFFERENTIAL      Result Value Range   WBC 13.9 (*) 4.0 - 10.5 K/uL   RBC 5.27  4.22 - 5.81 MIL/uL   Hemoglobin 17.1 (*) 13.0 - 17.0 g/dL   HCT 09.8  11.9 - 14.7 %   MCV 92.4  78.0 - 100.0 fL   MCH 32.4  26.0 - 34.0 pg   MCHC 35.1  30.0 - 36.0 g/dL   RDW 82.9  56.2 - 13.0 %   Platelets  299  150 - 400 K/uL   Neutrophils Relative % 75  43 - 77 %   Neutro Abs 10.4 (*) 1.7 - 7.7 K/uL   Lymphocytes Relative 18  12 - 46 %   Lymphs Abs  2.6  0.7 - 4.0 K/uL   Monocytes Relative 6  3 - 12 %   Monocytes Absolute 0.9  0.1 - 1.0 K/uL   Eosinophils Relative 0  0 - 5 %   Eosinophils Absolute 0.0  0.0 - 0.7 K/uL   Basophils Relative 0  0 - 1 %   Basophils Absolute 0.0  0.0 - 0.1 K/uL  COMPREHENSIVE METABOLIC PANEL      Result Value Range   Sodium 133 (*) 135 - 145 mEq/L   Potassium 4.0  3.5 - 5.1 mEq/L   Chloride 93 (*) 96 - 112 mEq/L   CO2 24  19 - 32 mEq/L   Glucose, Bld 134 (*) 70 - 99 mg/dL   BUN 16  6 - 23 mg/dL   Creatinine, Ser 1.61  0.50 - 1.35 mg/dL   Calcium 09.6  8.4 - 04.5 mg/dL   Total Protein 8.5 (*) 6.0 - 8.3 g/dL   Albumin 4.9  3.5 - 5.2 g/dL   AST 19  0 - 37 U/L   ALT 24  0 - 53 U/L   Alkaline Phosphatase 101  39 - 117 U/L   Total Bilirubin 1.2  0.3 - 1.2 mg/dL   GFR calc non Af Amer >90  >90 mL/min   GFR calc Af Amer >90  >90 mL/min  TROPONIN I      Result Value Range   Troponin I <0.30  <0.30 ng/mL  LIPASE, BLOOD      Result Value Range   Lipase 23  11 - 59 U/L   Dg Chest 2 View  09/21/2012   *RADIOLOGY REPORT*  Clinical Data: Chest pain and vomiting for 2 days.  Hypertension.  CHEST - 2 VIEW  Comparison: 05/16/2012 and 10/22/2011  Findings: Normal heart, mediastinal, and hilar contours and pulmonary vascularity.  The lungs are well expanded and clear. There is no pleural effusion.  No acute osseous abnormality is identified. Visualized portion of the upper abdomen is unremarkable.  IMPRESSION: No acute cardiopulmonary disease.   Original Report Authenticated By: Britta Mccreedy, M.D.   Ct Abdomen Pelvis W Contrast  09/21/2012   *RADIOLOGY REPORT*  Clinical Data: Diffuse abdominal pain.  Nausea.  Prior surgeries for hernia repair, cholecystectomy, and appendectomy.  CT ABDOMEN AND PELVIS WITH CONTRAST  Technique:  Multidetector CT imaging of the abdomen and  pelvis was performed following the standard protocol during bolus administration of intravenous contrast.  Contrast: OMNIPAQUE IOHEXOL 300 MG/ML  SOLN  Comparison: 02/25/2012  Findings: Mild hepatic steatosis noted.  No liver masses are identified.  Gallbladder surgically absent.  No evidence of biliary ductal dilatation.  The pancreas, spleen, adrenal glands, and kidneys are normal appearance.  No evidence of hydronephrosis.  No evidence of soft tissue masses or lymphadenopathy.  No evidence of inflammatory process or abnormal fluid collections.  No evidence of bowel wall thickening or dilatation.  Surgical mesh is seen in the anterior abdominal wall. There is a persistent small epigastric midline ventral hernia containing only fat. This is significantly decreased in size since previous study and there is no evidence of herniated bowel loops.  IMPRESSION:  1.  No acute findings. 2.  Persistent small epigastric midline ventral hernia containing only fat. 2.  Mild hepatic steatosis.   Original Report Authenticated By: Myles Rosenthal, M.D.    Images viewed by me.   Date: 09/21/2012  Rate: 121  Rhythm: sinus tachycardia  QRS Axis: normal  Intervals: normal  ST/T Wave abnormalities: nonspecific  ST/T changes  Conduction Disutrbances:none  Narrative Interpretation: Sinus tachycardia, nonspecific ST and T changes. When compared with ECG of 05/16/2012, no significant changes are seen  Old EKG Reviewed: unchanged    1. Abdominal pain   2. Nausea & vomiting       MDM  Nausea, vomiting, upper abdominal/lower chest pain. I suspect that this is related to a viral gastritis. Tachycardia would be consistent with dehydration. I do not see any evidence on exam of serious pathology. He given IV fluids, IV ondansetron, IV pantoprazole.  5:46 PM After the above-noted treatment, patient is noted to be sleeping and resting comfortably. However, when aroused, he states that he is not feeling any better. Nausea  is not improved and pain is not improved. He is given an injection of promethazine and hydromorphone.  6:49 PM Although he is again noted to be sleeping and resting comfortably, when aroused, he states that his pain and nausea are still in not improve. He does have a mild leukocytosis, so CT scan will be obtained to rule out other serious pathology.  10:00 PM CT has come back unremarkable. In spite of additional hydromorphone, he continues to complain of pain stating that there has been absolutely no change with any of the medications that he has received. He does not have evidence of an acute surgical abdomen. He appears comfortable in spite of his complaints of ongoing pain and nausea. He is referred back to his gastroenterologist at [facility]. His discharge with prescriptions for promethazine and Percocet. He is to return if symptoms are not being adequately controlled at home.  Dione Booze, MD 09/21/12 2202

## 2012-09-21 NOTE — ED Notes (Signed)
Chest and abd pain, onset yesterday.  Has been to [facility] and dx with Hiatal Hernia, and reflux, sent there by Dr Darrick Penna.  Nausea.

## 2012-10-05 MED FILL — Oxycodone w/ Acetaminophen Tab 5-325 MG: ORAL | Qty: 6 | Status: AC

## 2012-11-18 ENCOUNTER — Telehealth (HOSPITAL_COMMUNITY): Payer: Self-pay

## 2012-11-18 ENCOUNTER — Ambulatory Visit (HOSPITAL_COMMUNITY): Payer: Medicaid Other | Admitting: Physical Therapy

## 2012-11-24 ENCOUNTER — Ambulatory Visit (HOSPITAL_COMMUNITY)
Admission: RE | Admit: 2012-11-24 | Discharge: 2012-11-24 | Disposition: A | Discharge status: Home / Self Care | Payer: Medicaid Other | Source: Ambulatory Visit | Attending: Orthopaedic Surgery | Admitting: Orthopaedic Surgery

## 2012-11-24 DIAGNOSIS — R29898 Other symptoms and signs involving the musculoskeletal system: Secondary | ICD-10-CM | POA: Insufficient documentation

## 2012-11-24 DIAGNOSIS — IMO0001 Reserved for inherently not codable concepts without codable children: Secondary | ICD-10-CM | POA: Insufficient documentation

## 2012-11-24 DIAGNOSIS — I10 Essential (primary) hypertension: Secondary | ICD-10-CM | POA: Insufficient documentation

## 2012-11-24 DIAGNOSIS — M25579 Pain in unspecified ankle and joints of unspecified foot: Secondary | ICD-10-CM | POA: Insufficient documentation

## 2012-11-25 DIAGNOSIS — M25579 Pain in unspecified ankle and joints of unspecified foot: Secondary | ICD-10-CM | POA: Insufficient documentation

## 2012-11-25 NOTE — Evaluation (Signed)
Physical Therapy Evaluation  Patient Details  Name: [name] MRN: [id number] Date of Birth: May 19, 1980  Today's Date: 11/24/2012 Time: 1440-1520 PT Time Calculation (min): 40 min              Visit#: 1 of 8  Re-eval:   Assessment Diagnosis: Rt ankle contusion Surgical Date: 08/11/12 Next MD Visit: Dr. Hilda Lias  Authorization:   Medicaid  Authorization Time Period:    Authorization Visit#:1 of 1  Past Medical History:  Past Medical History  Diagnosis Date  . GERD (gastroesophageal reflux disease)     Bravo study Nov 2009 on Prilosec 40 BID with adequate acid suppression  . Hypertension   . Anxiety   . Depression     increased over past several months  . Chronic abdominal pain     HIDA scan Sept 2010, EF 93%, s/p chole oct 2010; evaluated at [facility] for abdominal wall pain; Imipramine added May 2011  . Chronic vomiting     normal GES 2009  . S/P endoscopy     2008 Dr. Karilyn Cota: erosive reflux esophagitis, antral gastritis, H.pylori serologies neg, Nov 2009 Dr. Darrick Penna: gastritis, benign esophageal polyp, no H.pylori,  Feb 2011: [facility], Dr. Bubba Hales: normal esophagus, normal stomach, normal duodenum, path unremarkable  . Ventral hernia   . Shortness of breath     SOB even with no exertion  . Sleep apnea     supposed to sleep with CPAP  . Hiatal hernia    Past Surgical History:  Past Surgical History  Procedure Laterality Date  . Ventral hernia repair  March 2012    Dr. Lovell Sheehan  . Knee surgery  2009  . Cholecystectomy  10/10  . Appendectomy  5/10  . Esophagogastroduodenoscopy  05/2006    H.pylori neg  . Inguinal hernia repair      child  . Esophagogastroduodenoscopy  03/23/2008    Fields-gastritis benign esophageal polyp, otherwise normal. Negative H. pylori he (propofol)  . Incisional hernia repair  03/04/2012    Procedure: LAPAROSCOPIC INCISIONAL HERNIA;  Surgeon: Dalia Heading, MD;  Location: [facility];  Service: General;  Laterality: N/A;  repair, recurrent  .  Insertion of mesh  03/04/2012    Procedure: INSERTION OF MESH;  Surgeon: Dalia Heading, MD;  Location: [facility];  Service: General;  Laterality: N/A;    Subjective Symptoms/Limitations Pertinent History: Pt is a 32 year old male referred to PT for an ankle sprain and contusion that happened on April 8th 2014 when a bucket landed on his ankle.  He went to the ED who provided him education on management and a f/u with Dr. Hilda Lias.  He was placed in a short cam walking boot in April and has been using it ever since.  He has been instructed by Dr. Hilda Lias to wean from the cam boot but has not been able to due to pain.  He reports his ankle feels weak when he is wearing a regular shoe.  He is currently on disability, lives alone and has joint custody of his 33 year old Careers information officer.  Patient Stated Goals: I want my ankle to feel stronger.  Pain Assessment Currently in Pain?: Yes Pain Score: 6  Pain Location: Ankle Pain Orientation: Left Pain Type: Acute pain Pain Onset: More than a month ago Pain Relieving Factors: pain medication and ice Effect of Pain on Daily Activities: difficulty taking care of his house.    Balance Screening Balance Screen Has the patient fallen in the past 6  months: Yes How many times?: 1 Has the patient had a decrease in activity level because of a fear of falling? : No Is the patient reluctant to leave their home because of a fear of falling? : No  Cognition/Observation Observation/Other Assessments Observations: able to long sit  Assessment RLE AROM (degrees) RLE Overall AROM Comments: unable to move his ankle from neutral due to pain.  Knee extension limited by 30 degrees with LAQ due to pain to medial ankle.  RLE Strength Right Ankle Dorsiflexion: 1/5 Right Ankle Plantar Flexion: 1/5 Right Ankle Inversion: 1/5 Right Ankle Eversion: 1/5 Palpation Palpation: allodynia to Rt medial ankle with superficial palpation  Mobility/Balance   Ambulation/Gait Ambulation/Gait: Yes Assistive device: None Gait velocity: decreased due to cam walker   Exercise/Treatments Long Sitting Ankle Circles Ankle DF/PF, Inv/Ever Seated LAQ  Toe and Heel raises   Physical Therapy Assessment and Plan PT Assessment and Plan Clinical Impression Statement: Pt is a 32 year old male referred to PT s/p Rt ankle contusion sustained on 08/11/12.  After evaluation it was found that pt displays postive results for symptom maginifcation including: superfical palpation, distraction and regional LE weakness. Pt able to sit in longsit on EOB without increased ankle pain, unable to complete LAQ on EOB, able to ambulate with CAM boot with appropriate gait mechanics and unable to test isometric strength to ankle and knee due signfiicant pain with superficial palpation.  Explained to pt importance of mobility to his ankle or it will remain weak.  Provided pt 3 phases of exercise and expllained may take up to 3 months of therapy  on his own to regain secondary to weakness.  Explained pt may develop CRPS  and encouraged to wean out of brace and continue with exercise.  At this time provided pt with education on Medicaid changes and provided him  information if he would like to continue as a self-pay patient and a number to call.  At this time pt will f/u with finaicial advisor to discuss if payment options are avaiable and may return if he can afford therapy.   Rehab Potential: Poor Clinical Impairments Affecting Rehab Potential: 3 + symptom magification test and unisured for PT treatment visits.  PT Frequency: Min 1X/week (if pt returns) PT Duration: 8 weeks PT Treatment/Interventions: Therapeutic exercise;Neuromuscular re-education;Manual techniques PT Plan: If pt able to return continue with manual to improve A/PROM and ankle strength.  Contrast bath as needed for pain control.     Goals Home Exercise Program Pt/caregiver will Perform Home Exercise Program:  Independently PT Goal: Perform Home Exercise Program - Progress: Goal set today PT Short Term Goals Time to Complete Short Term Goals: 4 weeks (if patient returns on his own) PT Short Term Goal 1: Pt will improve ankle AROM to WNL PT Short Term Goal 2: Pt will improve ankle strength to WNL. PT Short Term Goal 3: Pt will ambulate independently  Problem List Patient Active Problem List   Diagnosis Date Noted  . Pain in joint, ankle and foot 11/25/2012  . Hyperbilirubinemia 01/10/2012  . Hernia 11/12/2011  . Obesity 11/12/2011  . Abdominal wall pain 03/06/2011  . NAUSEA WITH VOMITING 05/15/2009  . WEIGHT GAIN, ABNORMAL 03/21/2009  . EPIGASTRIC PAIN, CHRONIC 03/21/2009  . 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 SMOKER 03/20/2009  . DEPRESSION 03/20/2009  . HYPERTENSION 03/20/2009  . GERD 03/20/2009  . HEMATEMESIS 03/20/2009  . SHOULDER PAIN, RIGHT 03/20/2009  . ANOREXIA 03/20/2009  . ABDOMINAL PAIN, GENERALIZED 03/20/2009  . CANNABIS ABUSE,  HX OF 03/20/2009    PT Plan of Care PT Home Exercise Plan: see scanned report PT Patient Instructions: explained to pt about insurance coverage, offered self pay as an option.  Demonstrated and discussed HEP provided pt with red theraband. Consulted and Agree with Plan of Care: Patient   Annett Fabian, MPT, ATC 11/25/2012, 8:57 AM  Physician Documentation Your signature is required to indicate approval of the treatment plan as stated above.  Please sign and either send electronically or make a copy of this report for your files and return this physician signed original.   Please mark one 1.__approve of plan  2. ___approve of plan with the following conditions.   ______________________________                                                          _____________________ Physician Signature                                                                                                             Date

## 2012-12-15 ENCOUNTER — Other Ambulatory Visit (HOSPITAL_COMMUNITY): Payer: Self-pay | Admitting: Orthopaedic Surgery

## 2012-12-15 DIAGNOSIS — M25571 Pain in right ankle and joints of right foot: Secondary | ICD-10-CM

## 2012-12-23 ENCOUNTER — Ambulatory Visit (HOSPITAL_COMMUNITY): Payer: Medicaid Other | Attending: Orthopaedic Surgery

## 2013-01-12 ENCOUNTER — Ambulatory Visit (HOSPITAL_COMMUNITY)
Admission: RE | Admit: 2013-01-12 | Discharge: 2013-01-12 | Disposition: A | Discharge status: Home / Self Care | Payer: Medicaid Other | Source: Ambulatory Visit | Attending: Orthopaedic Surgery | Admitting: Orthopaedic Surgery

## 2013-01-12 DIAGNOSIS — M65979 Unspecified synovitis and tenosynovitis, unspecified ankle and foot: Secondary | ICD-10-CM | POA: Insufficient documentation

## 2013-01-12 DIAGNOSIS — M659 Synovitis and tenosynovitis, unspecified: Secondary | ICD-10-CM | POA: Insufficient documentation

## 2013-01-12 DIAGNOSIS — M25579 Pain in unspecified ankle and joints of unspecified foot: Secondary | ICD-10-CM | POA: Insufficient documentation

## 2013-01-12 DIAGNOSIS — M898X9 Other specified disorders of bone, unspecified site: Secondary | ICD-10-CM | POA: Insufficient documentation

## 2013-01-12 DIAGNOSIS — M25571 Pain in right ankle and joints of right foot: Secondary | ICD-10-CM

## 2013-01-20 ENCOUNTER — Encounter (HOSPITAL_COMMUNITY): Payer: Self-pay

## 2013-01-20 ENCOUNTER — Emergency Department (HOSPITAL_COMMUNITY)
Admission: EM | Admit: 2013-01-20 | Discharge: 2013-01-20 | Disposition: A | Discharge status: Home / Self Care | Payer: Medicaid Other | Attending: Emergency Medicine | Admitting: Emergency Medicine

## 2013-01-20 DIAGNOSIS — F329 Major depressive disorder, single episode, unspecified: Secondary | ICD-10-CM | POA: Insufficient documentation

## 2013-01-20 DIAGNOSIS — Z23 Encounter for immunization: Secondary | ICD-10-CM | POA: Insufficient documentation

## 2013-01-20 DIAGNOSIS — Y929 Unspecified place or not applicable: Secondary | ICD-10-CM | POA: Insufficient documentation

## 2013-01-20 DIAGNOSIS — Z8719 Personal history of other diseases of the digestive system: Secondary | ICD-10-CM | POA: Insufficient documentation

## 2013-01-20 DIAGNOSIS — F3289 Other specified depressive episodes: Secondary | ICD-10-CM | POA: Insufficient documentation

## 2013-01-20 DIAGNOSIS — K219 Gastro-esophageal reflux disease without esophagitis: Secondary | ICD-10-CM | POA: Insufficient documentation

## 2013-01-20 DIAGNOSIS — X58XXXA Exposure to other specified factors, initial encounter: Secondary | ICD-10-CM | POA: Insufficient documentation

## 2013-01-20 DIAGNOSIS — Z9981 Dependence on supplemental oxygen: Secondary | ICD-10-CM | POA: Insufficient documentation

## 2013-01-20 DIAGNOSIS — S058X9A Other injuries of unspecified eye and orbit, initial encounter: Secondary | ICD-10-CM | POA: Insufficient documentation

## 2013-01-20 DIAGNOSIS — F411 Generalized anxiety disorder: Secondary | ICD-10-CM | POA: Insufficient documentation

## 2013-01-20 DIAGNOSIS — F17200 Nicotine dependence, unspecified, uncomplicated: Secondary | ICD-10-CM | POA: Insufficient documentation

## 2013-01-20 DIAGNOSIS — I10 Essential (primary) hypertension: Secondary | ICD-10-CM | POA: Insufficient documentation

## 2013-01-20 DIAGNOSIS — S0501XA Injury of conjunctiva and corneal abrasion without foreign body, right eye, initial encounter: Secondary | ICD-10-CM

## 2013-01-20 DIAGNOSIS — G8929 Other chronic pain: Secondary | ICD-10-CM | POA: Insufficient documentation

## 2013-01-20 DIAGNOSIS — Z79899 Other long term (current) drug therapy: Secondary | ICD-10-CM | POA: Insufficient documentation

## 2013-01-20 DIAGNOSIS — G4730 Sleep apnea, unspecified: Secondary | ICD-10-CM | POA: Insufficient documentation

## 2013-01-20 DIAGNOSIS — Y9389 Activity, other specified: Secondary | ICD-10-CM | POA: Insufficient documentation

## 2013-01-20 MED ORDER — TETANUS-DIPHTH-ACELL PERTUSSIS 5-2.5-18.5 LF-MCG/0.5 IM SUSP
0.5000 mL | Freq: Once | INTRAMUSCULAR | Status: AC
  Administered 2013-01-20: 0.5 mL via INTRAMUSCULAR
  Filled 2013-01-20: qty 0.5

## 2013-01-20 MED ORDER — HYDROCODONE-ACETAMINOPHEN 5-325 MG PO TABS: 1.0000 | ORAL_TABLET | ORAL | Status: DC | PRN

## 2013-01-20 MED ORDER — TOBRAMYCIN 0.3 % OP SOLN
2.0000 [drp] | OPHTHALMIC | Status: DC
  Administered 2013-01-20: 2 [drp] via OPHTHALMIC
  Filled 2013-01-20: qty 5

## 2013-01-20 MED ORDER — TETRACAINE HCL 0.5 % OP SOLN
1.0000 [drp] | Freq: Once | OPHTHALMIC | Status: AC
  Administered 2013-01-20: 1 [drp] via OPHTHALMIC
  Filled 2013-01-20: qty 2

## 2013-01-20 MED ORDER — FLUORESCEIN SODIUM 1 MG OP STRP: 1.0000 | ORAL_STRIP | Freq: Once | OPHTHALMIC | Status: AC

## 2013-01-20 MED ORDER — FLUORESCEIN SODIUM 1 MG OP STRP
ORAL_STRIP | OPHTHALMIC | Status: AC
  Administered 2013-01-20: 1 via OPHTHALMIC
  Filled 2013-01-20: qty 1

## 2013-01-20 NOTE — ED Notes (Signed)
Sawdust to right eye yesterday, flushed with water yesterday per pt but right eye still has irritation and is red and watery

## 2013-01-20 NOTE — ED Provider Notes (Signed)
CSN: [id number]     Arrival date & time 01/20/13  1111 History   First MD Initiated Contact with [name] 01/20/13 1131     Chief Complaint  [name] presents with  . Foreign Body in Eye   (Consider location/radiation/quality/duration/timing/severity/associated sxs/prior Treatment) [name] is a 32 y.o. male presenting with foreign body in eye. The history is provided by the [name].  Foreign Body in Eye This is a new problem. The current episode started yesterday. The problem has been unchanged. Pertinent negatives include no coughing, fever, myalgias, nausea, rash or vomiting. He has tried nothing for the symptoms.   [name] is a 32 y.o. male who presents to the ED with right eye irritation since yesterday. He thinks saw dust got in his right eye. He tried to wash his eye out with water but it continued to feel irritated during the night.  Past Medical History  Diagnosis Date  . GERD (gastroesophageal reflux disease)     Bravo study Nov 2009 on Prilosec 40 BID with adequate acid suppression  . Hypertension   . Anxiety   . Depression     increased over past several months  . Chronic abdominal pain     HIDA scan Sept 2010, EF 93%, s/p chole oct 2010; evaluated at [facility] for abdominal wall pain; Imipramine added May 2011  . Chronic vomiting     normal GES 2009  . S/P endoscopy     2008 Dr. Karilyn Cota: erosive reflux esophagitis, antral gastritis, H.pylori serologies neg, Nov 2009 Dr. Darrick Penna: gastritis, benign esophageal polyp, no H.pylori,  Feb 2011: [facility], Dr. Bubba Hales: normal esophagus, normal stomach, normal duodenum, path unremarkable  . Ventral hernia   . Shortness of breath     SOB even with no exertion  . Sleep apnea     supposed to sleep with CPAP  . Hiatal hernia    Past Surgical History  Procedure Laterality Date  . Ventral hernia repair  March 2012    Dr. Lovell Sheehan  . Knee surgery  2009  . Cholecystectomy  10/10  . Appendectomy  5/10  . Esophagogastroduodenoscopy   05/2006    H.pylori neg  . Inguinal hernia repair      child  . Esophagogastroduodenoscopy  03/23/2008    Fields-gastritis benign esophageal polyp, otherwise normal. Negative H. pylori he (propofol)  . Incisional hernia repair  03/04/2012    Procedure: LAPAROSCOPIC INCISIONAL HERNIA;  Surgeon: Dalia Heading, MD;  Location: [facility];  Service: General;  Laterality: N/A;  repair, recurrent  . Insertion of mesh  03/04/2012    Procedure: INSERTION OF MESH;  Surgeon: Dalia Heading, MD;  Location: [facility];  Service: General;  Laterality: N/A;   Family History  Problem Relation Age of Onset  . Colon cancer Neg Hx    History  Substance Use Topics  . Smoking status: Current Every Day Smoker -- 1.00 packs/day for 15 years    Types: Cigarettes  . Smokeless tobacco: Never Used     Comment: Less than 1/2 pk a day. trying to quit.  03/04/12 1255 states "was doing good, but now up to about a pack per day"  . Alcohol Use: No    Review of Systems  Constitutional: Negative for fever and appetite change.  HENT: Negative for facial swelling.   Eyes: Positive for pain, redness and itching. Negative for visual disturbance.  Respiratory: Negative for cough.   Gastrointestinal: Negative for nausea and vomiting.  Musculoskeletal: Negative for myalgias.  Skin:  Negative for rash.  Psychiatric/Behavioral: The [name] is not nervous/anxious.     Allergies  Dexilant; Propoxyphene-acetaminophen; Zofran; and Zofran odt  Home Medications   Current Outpatient Rx  Name  Route  Sig  Dispense  Refill  . albuterol (PROVENTIL HFA;VENTOLIN HFA) 108 (90 BASE) MCG/ACT inhaler   Inhalation   Inhale 2 puffs into the lungs every 6 (six) hours as needed. Shortness of breath          . ALPRAZolam (XANAX) 1 MG tablet   Oral   Take 1 mg by mouth 4 (four) times daily.           Marland Kitchen HYDROcodone-acetaminophen (NORCO) 7.5-325 MG per tablet   Oral   Take 1 tablet by mouth every 6 (six) hours as needed for pain.          Marland Kitchen ibuprofen (ADVIL,MOTRIN) 800 MG tablet   Oral   Take 1 tablet (800 mg total) by mouth 3 (three) times daily.   21 tablet   0   . lisinopril-hydrochlorothiazide (PRINZIDE,ZESTORETIC) 20-25 MG per tablet   Oral   Take 1 tablet by mouth daily.           Marland Kitchen omeprazole (PRILOSEC) 40 MG capsule   Oral   Take 1 capsule (40 mg total) by mouth 2 (two) times daily.   60 capsule   5   . traZODone (DESYREL) 150 MG tablet   Oral   Take 150 mg by mouth at bedtime as needed for sleep.          BP 152/74  Pulse 77  Temp(Src) 98.7 F (37.1 C) (Oral)  Resp 20  Ht 5\' 9"  (1.753 m)  Wt 280 lb (127.007 kg)  BMI 41.33 kg/m2  SpO2 97% Physical Exam  Nursing note and vitals reviewed. Constitutional: He is oriented to person, place, and time. He appears well-developed and well-nourished. No distress.  HENT:  Head: Atraumatic.  Eyes: EOM are normal. Pupils are equal, round, and reactive to light. Right conjunctiva is injected.  Slit lamp exam:      The right eye shows corneal abrasion and fluorescein uptake.    Neck: Normal range of motion. Neck supple.  Cardiovascular: Normal rate.   Pulmonary/Chest: Effort normal.  Musculoskeletal: Normal range of motion.  Neurological: He is alert and oriented to person, place, and time. No cranial nerve deficit.  Skin: Skin is warm and dry.  Psychiatric: He has a normal mood and affect. His behavior is normal.    ED Course  Procedures  MDM  32 y.o. male with corneal abrasions to the right eye. Will treat with antibiotic eye drops. First dose of eye drops instilled here in the ED. Rx for  pain medication and follow up with opthalmology. Will update tetanus.  Discussed with the [name] and all questioned fully answered. He will return if any problems arise.    Medication List    STOP taking these medications       HYDROcodone-acetaminophen 7.5-325 MG per tablet  Commonly known as:  NORCO  Replaced by:  HYDROcodone-acetaminophen 5-325 MG  per tablet      TAKE these medications       HYDROcodone-acetaminophen 5-325 MG per tablet  Commonly known as:  NORCO/VICODIN  Take 1 tablet by mouth every 4 (four) hours as needed.      ASK your doctor about these medications       albuterol 108 (90 BASE) MCG/ACT inhaler  Commonly known as:  PROVENTIL HFA;VENTOLIN HFA  Inhale 2 puffs into the lungs every 6 (six) hours as needed. Shortness of breath     ALPRAZolam 1 MG tablet  Commonly known as:  XANAX  Take 1 mg by mouth 4 (four) times daily.     ibuprofen 800 MG tablet  Commonly known as:  ADVIL,MOTRIN  Take 1 tablet (800 mg total) by mouth 3 (three) times daily.     lisinopril-hydrochlorothiazide 20-25 MG per tablet  Commonly known as:  PRINZIDE,ZESTORETIC  Take 1 tablet by mouth daily.     omeprazole 40 MG capsule  Commonly known as:  PRILOSEC  Take 1 capsule (40 mg total) by mouth 2 (two) times daily.     traZODone 150 MG tablet  Commonly known as:  DESYREL  Take 150 mg by mouth at bedtime as needed for sleep.           [facility] Orlene Och, [place] 01/20/13 1404

## 2013-01-20 NOTE — ED Notes (Signed)
Pt has sawdust to right eye since yesterday.

## 2013-01-21 NOTE — ED Provider Notes (Signed)
Medical screening examination/treatment/procedure(s) were performed by non-physician practitioner and as supervising physician I was immediately available for consultation/collaboration.   Candyce Churn, MD 01/21/13 [phone]

## 2013-01-22 ENCOUNTER — Emergency Department (HOSPITAL_COMMUNITY)
Admission: EM | Admit: 2013-01-22 | Discharge: 2013-01-22 | Disposition: A | Discharge status: Home / Self Care | Payer: Medicaid Other | Attending: Emergency Medicine | Admitting: Emergency Medicine

## 2013-01-22 ENCOUNTER — Encounter (HOSPITAL_COMMUNITY): Payer: Self-pay | Admitting: *Deleted

## 2013-01-22 DIAGNOSIS — K219 Gastro-esophageal reflux disease without esophagitis: Secondary | ICD-10-CM | POA: Insufficient documentation

## 2013-01-22 DIAGNOSIS — I10 Essential (primary) hypertension: Secondary | ICD-10-CM | POA: Insufficient documentation

## 2013-01-22 DIAGNOSIS — F329 Major depressive disorder, single episode, unspecified: Secondary | ICD-10-CM | POA: Insufficient documentation

## 2013-01-22 DIAGNOSIS — H109 Unspecified conjunctivitis: Secondary | ICD-10-CM | POA: Insufficient documentation

## 2013-01-22 DIAGNOSIS — R1110 Vomiting, unspecified: Secondary | ICD-10-CM | POA: Insufficient documentation

## 2013-01-22 DIAGNOSIS — G4730 Sleep apnea, unspecified: Secondary | ICD-10-CM | POA: Insufficient documentation

## 2013-01-22 DIAGNOSIS — F17200 Nicotine dependence, unspecified, uncomplicated: Secondary | ICD-10-CM | POA: Insufficient documentation

## 2013-01-22 DIAGNOSIS — Z79899 Other long term (current) drug therapy: Secondary | ICD-10-CM | POA: Insufficient documentation

## 2013-01-22 DIAGNOSIS — G8929 Other chronic pain: Secondary | ICD-10-CM | POA: Insufficient documentation

## 2013-01-22 DIAGNOSIS — F411 Generalized anxiety disorder: Secondary | ICD-10-CM | POA: Insufficient documentation

## 2013-01-22 DIAGNOSIS — F3289 Other specified depressive episodes: Secondary | ICD-10-CM | POA: Insufficient documentation

## 2013-01-22 MED ORDER — CEPHALEXIN 500 MG PO CAPS: 500.0000 mg | ORAL_CAPSULE | Freq: Four times a day (QID) | ORAL | Status: DC

## 2013-01-22 MED ORDER — METHYLPREDNISOLONE SODIUM SUCC 125 MG IJ SOLR
125.0000 mg | Freq: Once | INTRAMUSCULAR | Status: AC
  Administered 2013-01-22: 125 mg via INTRAMUSCULAR

## 2013-01-22 MED ORDER — CEPHALEXIN 500 MG PO CAPS
500.0000 mg | ORAL_CAPSULE | Freq: Once | ORAL | Status: AC
  Administered 2013-01-22: 500 mg via ORAL
  Filled 2013-01-22: qty 1

## 2013-01-22 MED ORDER — METHYLPREDNISOLONE SODIUM SUCC 125 MG IJ SOLR
125.0000 mg | Freq: Once | INTRAMUSCULAR | Status: DC
  Filled 2013-01-22: qty 2

## 2013-01-22 NOTE — ED Provider Notes (Signed)
Medical screening examination/treatment/procedure(s) were performed by non-physician practitioner and as supervising physician I was immediately available for consultation/collaboration.   Shelda Jakes, MD 01/22/13 [phone]

## 2013-01-22 NOTE — ED Notes (Signed)
Sawdust in rt eye on 9/16 , seen here 9/17 .  Unable to see Md  Both eyes red now.

## 2013-01-22 NOTE — ED Provider Notes (Signed)
CSN: [id number]     Arrival date & time 01/22/13  1237 History   First MD Initiated Contact with Patient 01/22/13 1355     Chief Complaint  Patient presents with  . Eye Pain   (Consider location/radiation/quality/duration/timing/severity/associated sxs/prior Treatment) HPI Comments: Patient states that while working on September 16, he got" sawdust" in the right eye. The patient was seen in the emergency department. He was treated with tobramycin eyedrops and pain medication. The patient states that today it last evening he began noticing increasing redness of the right eye a nail into the left eye. He states he's noticing increasing tearing. His vision has been unaffected. He's not had any high fever. He's not had any other injury to the eye. He has stopped his tobramycin drops because he felt they were irritating him. He presents to the emergency apartment at this time for additional evaluation of the increased redness of both eyes.  The history is provided by the patient.    Past Medical History  Diagnosis Date  . GERD (gastroesophageal reflux disease)     Bravo study Nov 2009 on Prilosec 40 BID with adequate acid suppression  . Hypertension   . Anxiety   . Depression     increased over past several months  . Chronic abdominal pain     HIDA scan Sept 2010, EF 93%, s/p chole oct 2010; evaluated at [facility] for abdominal wall pain; Imipramine added May 2011  . Chronic vomiting     normal GES 2009  . S/P endoscopy     2008 Dr. Karilyn Cota: erosive reflux esophagitis, antral gastritis, H.pylori serologies neg, Nov 2009 Dr. Darrick Penna: gastritis, benign esophageal polyp, no H.pylori,  Feb 2011: [facility], Dr. Bubba Hales: normal esophagus, normal stomach, normal duodenum, path unremarkable  . Ventral hernia   . Shortness of breath     SOB even with no exertion  . Sleep apnea     supposed to sleep with CPAP  . Hiatal hernia    Past Surgical History  Procedure Laterality Date  . Ventral hernia repair   March 2012    Dr. Lovell Sheehan  . Knee surgery  2009  . Cholecystectomy  10/10  . Appendectomy  5/10  . Esophagogastroduodenoscopy  05/2006    H.pylori neg  . Inguinal hernia repair      child  . Esophagogastroduodenoscopy  03/23/2008    Fields-gastritis benign esophageal polyp, otherwise normal. Negative H. pylori he (propofol)  . Incisional hernia repair  03/04/2012    Procedure: LAPAROSCOPIC INCISIONAL HERNIA;  Surgeon: Dalia Heading, MD;  Location: [facility];  Service: General;  Laterality: N/A;  repair, recurrent  . Insertion of mesh  03/04/2012    Procedure: INSERTION OF MESH;  Surgeon: Dalia Heading, MD;  Location: [facility];  Service: General;  Laterality: N/A;   Family History  Problem Relation Age of Onset  . Colon cancer Neg Hx   . Diabetes Mother    History  Substance Use Topics  . Smoking status: Current Every Day Smoker -- 1.00 packs/day for 15 years    Types: Cigarettes  . Smokeless tobacco: Never Used     Comment: Less than 1/2 pk a day. trying to quit.  03/04/12 1255 states "was doing good, but now up to about a pack per day"  . Alcohol Use: No    Review of Systems  Constitutional: Negative for activity change.       All ROS Neg except as noted in HPI  HENT: Negative for  nosebleeds and neck pain.   Eyes: Positive for redness and itching. Negative for photophobia and discharge.  Respiratory: Negative for cough, shortness of breath and wheezing.   Cardiovascular: Negative for chest pain and palpitations.  Gastrointestinal: Negative for abdominal pain and blood in stool.  Genitourinary: Negative for dysuria, frequency and hematuria.  Musculoskeletal: Negative for back pain and arthralgias.  Skin: Negative.   Neurological: Negative for dizziness, seizures and speech difficulty.  Psychiatric/Behavioral: Negative for hallucinations and confusion.    Allergies  Dexilant; Propoxyphene-acetaminophen; Zofran; and Zofran odt  Home Medications   Current Outpatient Rx   Name  Route  Sig  Dispense  Refill  . albuterol (PROVENTIL HFA;VENTOLIN HFA) 108 (90 BASE) MCG/ACT inhaler   Inhalation   Inhale 2 puffs into the lungs every 6 (six) hours as needed. Shortness of breath          . ALPRAZolam (XANAX) 1 MG tablet   Oral   Take 1 mg by mouth 4 (four) times daily.           Marland Kitchen HYDROcodone-acetaminophen (NORCO/VICODIN) 5-325 MG per tablet   Oral   Take 1 tablet by mouth every 4 (four) hours as needed.   15 tablet   0   . ibuprofen (ADVIL,MOTRIN) 800 MG tablet   Oral   Take 1 tablet (800 mg total) by mouth 3 (three) times daily.   21 tablet   0   . lisinopril-hydrochlorothiazide (PRINZIDE,ZESTORETIC) 20-25 MG per tablet   Oral   Take 1 tablet by mouth daily.           Marland Kitchen omeprazole (PRILOSEC) 40 MG capsule   Oral   Take 1 capsule (40 mg total) by mouth 2 (two) times daily.   60 capsule   5   . traZODone (DESYREL) 150 MG tablet   Oral   Take 150 mg by mouth at bedtime as needed for sleep.          BP 133/69  Pulse 87  Temp(Src) 98 F (36.7 C) (Oral)  Resp 20  Ht 5\' 9"  (1.753 m)  Wt 280 lb (127.007 kg)  BMI 41.33 kg/m2  SpO2 97% Physical Exam  Nursing note and vitals reviewed. Constitutional: He is oriented to person, place, and time. He appears well-developed and well-nourished.  Non-toxic appearance.  HENT:  Head: Normocephalic.  Right Ear: Tympanic membrane and external ear normal.  Left Ear: Tympanic membrane and external ear normal.  Eyes: EOM and lids are normal. Pupils are equal, round, and reactive to light.  There is increased redness of right and left conjunctiva. Increased tearing of both eyes during examination. The anterior chamber is clear bilaterally. There is mild swelling of the upper and lower lids. There is no periorbital tenderness. The periorbital area is not hot.  Neck: Normal range of motion. Neck supple. Carotid bruit is not present.  Cardiovascular: Normal rate, regular rhythm, normal heart sounds,  intact distal pulses and normal pulses.   Pulmonary/Chest: Breath sounds normal. No respiratory distress.  Abdominal: Soft. Bowel sounds are normal. There is no tenderness. There is no guarding.  Musculoskeletal: Normal range of motion.  Lymphadenopathy:       Head (right side): No submandibular adenopathy present.       Head (left side): No submandibular adenopathy present.    He has no cervical adenopathy.  Neurological: He is alert and oriented to person, place, and time. He has normal strength. No cranial nerve deficit or sensory deficit.  Skin: Skin is  warm and dry.  Psychiatric: He has a normal mood and affect. His speech is normal.    ED Course  Procedures (including critical care time) Labs Review Labs Reviewed - No data to display Imaging Review No results found.  MDM  No diagnosis found. *I have reviewed nursing notes, vital signs, and all appropriate lab and imaging results for this patient.**  Patient's examination and history are consistent with conjunctivitis. I have discussed this with the patient in terms which he understands. I have also discussed the contagious nature of this problem.  I reviewed the records from September 16. The patient had 2 corneal scratches. We'll cover the patient with Keflex do to these scratches. The patient has been advised to stop his tobramycin eyedrops. He has been advised to use cool compresses to both eyes. He is to see ophthalmology specialist if not improving or return to the emergency department.  Kathie Dike, PA-C 01/22/13 1558

## 2013-03-08 ENCOUNTER — Ambulatory Visit (INDEPENDENT_AMBULATORY_CARE_PROVIDER_SITE_OTHER): Payer: Medicaid Other | Admitting: Family Medicine

## 2013-03-08 ENCOUNTER — Encounter: Payer: Self-pay | Admitting: Family Medicine

## 2013-03-08 VITALS — BP 170/98 | HR 82 | Temp 97.9°F | Resp 24 | Ht 66.0 in | Wt 288.0 lb

## 2013-03-08 DIAGNOSIS — F3189 Other bipolar disorder: Secondary | ICD-10-CM

## 2013-03-08 DIAGNOSIS — J45901 Unspecified asthma with (acute) exacerbation: Secondary | ICD-10-CM | POA: Insufficient documentation

## 2013-03-08 DIAGNOSIS — F17200 Nicotine dependence, unspecified, uncomplicated: Secondary | ICD-10-CM

## 2013-03-08 DIAGNOSIS — F3181 Bipolar II disorder: Secondary | ICD-10-CM

## 2013-03-08 DIAGNOSIS — Z79899 Other long term (current) drug therapy: Secondary | ICD-10-CM

## 2013-03-08 DIAGNOSIS — Z720 Tobacco use: Secondary | ICD-10-CM | POA: Insufficient documentation

## 2013-03-08 DIAGNOSIS — G4730 Sleep apnea, unspecified: Secondary | ICD-10-CM

## 2013-03-08 DIAGNOSIS — J309 Allergic rhinitis, unspecified: Secondary | ICD-10-CM

## 2013-03-08 DIAGNOSIS — J029 Acute pharyngitis, unspecified: Secondary | ICD-10-CM | POA: Insufficient documentation

## 2013-03-08 DIAGNOSIS — I10 Essential (primary) hypertension: Secondary | ICD-10-CM

## 2013-03-08 DIAGNOSIS — J4531 Mild persistent asthma with (acute) exacerbation: Secondary | ICD-10-CM

## 2013-03-08 MED ORDER — FLUTICASONE PROPIONATE 50 MCG/ACT NA SUSP: 2.0000 | Freq: Every day | NASAL | Status: DC

## 2013-03-08 MED ORDER — ALBUTEROL SULFATE HFA 108 (90 BASE) MCG/ACT IN AERS: 2.0000 | INHALATION_SPRAY | Freq: Four times a day (QID) | RESPIRATORY_TRACT | Status: DC | PRN

## 2013-03-08 MED ORDER — BECLOMETHASONE DIPROPIONATE 80 MCG/ACT IN AERS: 1.0000 | INHALATION_SPRAY | Freq: Two times a day (BID) | RESPIRATORY_TRACT | Status: DC

## 2013-03-08 MED ORDER — PREDNISONE 20 MG PO TABS: ORAL_TABLET | ORAL | Status: DC

## 2013-03-08 NOTE — Patient Instructions (Signed)
Manic Depression (Bipolar Disorder)  Bipolar disorder is also known as manic depressive illness. It is when the brain does not function properly and causes shifts in a person's moods, energy and ability to function in everyday life. These shifts are different from the normal ups and downs that everyone experiences. Instead the shifts are severe. If this goes untreated, the person's life becomes more and more disorderly. People with this disorder can be treated can lead full and productive lives. This disorder must be managed throughout life.   SYMPTOMS    Bipolar disorder causes dramatic mood swings. These mood swings go in cycles. They cycle from extreme "highs" and irritable to deep "lows" of sadness and hopelessness.   Between the extreme moods, there are usually periods of normal mood.   Along with the mood shifts, the person will have severe changes in energy and behavior. The periods of "highs" and "lows" are called episodes of mania and depression.  Signs of mania:   Lots of energy, activity and restlessness.   Extreme "high" or good mood.   Extreme irritability.   Racing thoughts and talking very fast.   Jumping from one idea to another.   Not able to focus, easily distracted.   Little need to sleep.   Grand beliefs in one's abilities and powers.   Spending sprees.   Increased sexual drive. This can result in many sexual partners.   Poor judgment.   Abuse of drugs, particularly cocaine, alcohol, and sleeping medication.   Aggressive or provocative behavior.   A lasting period of behavior that is different from usual.   Denial that anything is wrong.  *A manic episode is identified if a "high" mood happens with three or more of the other symptoms lasting most of the day, nearly everyday for a week or longer. If the mood is more irritable in nature, four additional symptoms must be present.  Signs of depression:   Lasting feelings of sadness, anxiety, or empty mood.   Feelings of  hopelessness with negative thoughts.   Feelings of guilt, worthlessness, or helplessness.   Loss of interest or pleasure in activities once enjoyed, including sex.   Feelings of fatigue or having less energy.   Trouble focusing, making decisions, remembering.   Feeling restless or irritable.   Sleeping too little or too much.   Change in eating with possible weight gain or loss.   Feeling ongoing pain that is not caused by physical illness or injury.   Thoughts of death or suicide or suicide attempts.  *A depressive episode is identified as having five or more of the above symptoms that last most of the day, nearly everyday for two weeks or longer.  CAUSES    Research shows that there is no single cause for the disorder. Many factors act together to produce the illness.   This can be passed down from family (hereditary).   Environment may play a part.  TREATMENT    Long-term treatment is strongly recommended because bipolar disorder is a repeated illness. This disorder is better controlled if treatment is ongoing than if it is off and on.   A combination of medication and talk therapy is best for managing the disorder over time.   Medication.   Medication can be prescribed by a doctor that is an expert in treating mental disorders (psychiatrists). Medications known as "mood stabilizers" are usually prescribed to help control the illness. Other medications can be added when needed. These medicines usually treat episodes   of treatment increases mood stability, decreases need for hospitalization and improves how they function society.  Electroconvulsive Therapy (ECT).  In extreme situations where the above treatments do not work or  work too slowly to relieve severe symptoms, ECT may be considered. Document Released: 07/29/2000 Document Revised: 07/15/2011 Document Reviewed: 03/20/2007 [facility] Patient Information 2014 [place], [place]. Albuterol inhalation aerosol What is this medicine? ALBUTEROL (al Gaspar Bidding) is a bronchodilator. It helps open up the airways in your lungs to make it easier to breathe. This medicine is used to treat and to prevent bronchospasm. This medicine may be used for other purposes; ask your health care provider or pharmacist if you have questions. What should I tell my health care provider before I take this medicine? They need to know if you have any of the following conditions: -diabetes -heart disease or irregular heartbeat -high blood pressure -pheochromocytoma -seizures -thyroid disease -an unusual or allergic reaction to albuterol, levalbuterol, sulfites, other medicines, foods, dyes, or preservatives -pregnant or trying to get pregnant -breast-feeding How should I use this medicine? This medicine is for inhalation through the mouth. Follow the directions on your prescription label. Take your medicine at regular intervals. Do not use more often than directed. Make sure that you are using your inhaler correctly. Ask you doctor or health care provider if you have any questions. Use this medicine before you use any other inhaler. Wait 5 minutes or more before between using different inhalers. Talk to your pediatrician regarding the use of this medicine in children. Special care may be needed. Overdosage: If you think you have taken too much of this medicine contact a poison control center or emergency room at once. NOTE: This medicine is only for you. Do not share this medicine with others. What if I miss a dose? If you miss a dose, use it as soon as you can. If it is almost time for your next dose, use only that dose. Do not use double or extra doses. What may interact with this  medicine? -anti-infectives like chloroquine and pentamidine -caffeine -cisapride -diuretics -medicines for colds -medicines for depression or for emotional or psychotic conditions -medicines for weight loss including some herbal products -methadone -some antibiotics like clarithromycin, erythromycin, levofloxacin, and linezolid -some heart medicines -steroid hormones like dexamethasone, cortisone, hydrocortisone -theophylline -thyroid hormones This list may not describe all possible interactions. Give your health care provider a list of all the medicines, herbs, non-prescription drugs, or dietary supplements you use. Also tell them if you smoke, drink alcohol, or use illegal drugs. Some items may interact with your medicine. What should I watch for while using this medicine? Tell your doctor or health care professional if your symptoms do not improve. Do not use extra albuterol. If your asthma or bronchitis gets worse while you are using this medicine, call your doctor right away. If your mouth gets dry try chewing sugarless gum or sucking hard candy. Drink water as directed. What side effects may I notice from receiving this medicine? Side effects that you should report to your doctor or health care professional as soon as possible: -allergic reactions like skin rash, itching or hives, swelling of the face, lips, or tongue -breathing problems -chest pain -feeling faint or lightheaded, falls -high blood pressure -irregular heartbeat -fever -muscle cramps or weakness -pain, tingling, numbness in the hands or feet -vomiting Side effects that usually do not require medical attention (report to your doctor or health care professional if they continue or are bothersome): -cough -  difficulty sleeping -headache -nervousness or trembling -stomach upset -stuffy or runny nose -throat irritation -unusual taste This list may not describe all possible side effects. Call your doctor for  medical advice about side effects. You may report side effects to FDA at 1-800-FDA-1088. Where should I keep my medicine? Keep out of the reach of children. Store at room temperature between 15 and 30 degrees C (59 and 86 degrees F). The contents are under pressure and may burst when exposed to heat or flame. Do not freeze. This medicine does not work as well if it is too cold. Throw away any unused medicine after the expiration date. Inhalers need to be thrown away after the labeled number of puffs have been used or by the expiration date; whichever comes first. Ventolin HFA should be thrown away 12 months after removing from foil pouch. Check the instructions that come with your medicine. NOTE: This sheet is a summary. It may not cover all possible information. If you have questions about this medicine, talk to your doctor, pharmacist, or health care provider.  2012, Elsevier/Gold Standard. (09/07/2010 11:00:52 AM)Beclomethasone inhalation aerosol What is this medicine? BECLOMETHASONE (be kloe METH a sone) is a corticosteroid. It helps decrease inflammation in your lungs. This medicine is used to treat the symptoms of asthma. Never use this medicine for an acute asthma attack. This medicine may be used for other purposes; ask your health care provider or pharmacist if you have questions. What should I tell my health care provider before I take this medicine? They need to know if you have any of these conditions: -infection, like tuberculosis, herpes, or fungal infection -recent surgery or injury of mouth or throat -taking corticosteroids by mouth -an unusual or allergic reaction to beclomethasone, other corticosteroids, other medicines, foods, dyes, or preservatives -pregnant or trying to get pregnant -breast-feeding How should I use this medicine? This medicine is for inhalation through the mouth. Follow the directions on your prescription label. This medicine works best if used regularly. Do not  use more than directed. Do not stop taking except on your doctor's advice. Make sure that you are using your inhaler correctly. Ask you doctor or health care provider if you have any questions. Use this medicine after you use any bronchodilator inhaler, like albuterol. Wait 5 minutes or more before using different inhalers. Talk to your pediatrician regarding the use of this medicine in children. While this drug may be prescribed for children as young as 34 years old for selected conditions, precautions do apply. Overdosage: If you think you have taken too much of this medicine contact a poison control center or emergency room at once. NOTE: This medicine is only for you. Do not share this medicine with others. What if I miss a dose? If you miss a dose, use it as soon as you remember. If it is almost time for your next dose, use only that dose and continue with your regular schedule, spacing doses evenly. Do not use double or extra doses. What may interact with this medicine? Interactions are not expected. This list may not describe all possible interactions. Give your health care provider a list of all the medicines, herbs, non-prescription drugs, or dietary supplements you use. Also tell them if you smoke, drink alcohol, or use illegal drugs. Some items may interact with your medicine. What should I watch for while using this medicine? Visit your doctor or health care professional for regular check ups. Use this medicine regularly. Tell your doctor if your  symptoms do not improve. Do not use extra medicine. If your asthma symptoms get worse while you are using this medicine, call your doctor right away. Do not come in contact with people who have chickenpox or the measles while you are taking this medicine. If you do, call your doctor right away. What side effects may I notice from receiving this medicine? Side effects that you should report to your doctor or health care professional as soon as  possible: -allergic reactions like skin rash, itching or hives, swelling of the face, lips, or tongue -changes in vision -chest pain, tightness -fever, infection -trouble breathing, wheezing -unusual swelling -white patches or sores in the mouth or throat Side effects that usually do not require medical attention (report to your doctor or health care professional if they continue or are bothersome): -burning, irritation in throat -cough -dry mouth -headache -unusual taste or smell This list may not describe all possible side effects. Call your doctor for medical advice about side effects. You may report side effects to FDA at 1-800-FDA-1088. Where should I keep my medicine? Keep out of the reach of children. Store at room temperature between 15 and 30 degrees C (59 and 86 degrees F). The effect of this medicine is less when the canister is cold. Do not puncture the canister or throw on a fire or incinerator. Throw away any unused medicine after the expiration date. NOTE: This sheet is a summary. It may not cover all possible information. If you have questions about this medicine, talk to your doctor, pharmacist, or health care provider.  2013, Elsevier/Gold Standard. (04/05/2008 11:18:29 AM)

## 2013-03-08 NOTE — Progress Notes (Signed)
Subjective:    Patient ID: [name], male    DOB: 1980-09-02, 32 y.o.   MRN: [id number]  HPI Comments: [name] is a 32 y.o WM here to establish care.   He has been seeing [facility] for many years. Medical chart reviewed and patient was on multiple psychiatric medications over the years. He's been on Paxil, Prozac, Zoloft which didn't work. His psychiatrist for 3 years, from 2010-2013, Dr. Betti Cruz, left and he saw someone else. Prior to leaving, they finally found a regimen that worked for him. He was diagnosed with bipolar 2 disorder and was on Topamax 100mg  BID, Trazodone 100mg  qhs, Celexa 40 mg qam, Tegretol 100mg  BID and 200mg  at bedtime along with Xanax 1 mg q.i.d.  He was seen by another psychiatrist and for some reason, most of his medications were stopped and he was being weaned off of the xanax. I'm unsure why this was done and it's unclear from the record.  He doesn't wish to return. He says he finally had medication that helped him with his mood disorder and now that these were stopped, he's going back to where he was where he sleeps all day and doesn't have the will to do anything. He also reports a custody battle over his daughter and he would like to get back on his medications before the next trial.    He also has a hx of right ankle pain. He is currently seeing Dr. Danella Deis for this and has management under his care. His last MRI of right ankle showed: Peroneus longus tendinopathy and potentially mild partial thickness tearing of the peroneus brevis below the lateral malleolus. Mild distal tibialis posterior tenosynovitis. Mild dorsal navicular spurring.  He has hx of HTN and has been on zestoretic. He says he takes this around 12 noon. He says most times, his blood pressure averages around 130s-160s.  He says he hasn't checked his blood pressure in months but he denies headaches, blurred vision or chest pains. He was noted to have lab work from his chart back in May. He was  noted to have hyperglycemia and hyponatremia. He says his labs have been checked since then and he has also been checked for diabetes, of which he doesn't have. He says this was done at [facility] in [place]. He has also a hx of apnea and snoring at night. He's had a sleep study done but didn't get to finish the second part because in the middle of the night, he had a panic attack while wearing the CPAP mask.  He says he's waiting to hear back from the results of the first part. He's also getting records sent here.    He has complaints of sore throat and he says it started yesterday. He says his daughter has been sick with a sore throat for the last week. He tried chloroseptic spray for the sore throat.   He says his daughter is well now but his sore throat started last night. He has tried throat spray. He denies ear aches and he says he's had a wheeze along with a cough. He doesn't have sputum production, SOB, nor fevers. He does use tobacco and says he has a hx of asthma. He's been on albuterol inhaler although he says he's been too tired to get up and go get the prescription from the pharmacy. He has been out of his inhaler for the last several days.  Sore Throat  This is a new problem. The current episode  started yesterday. The problem has been unchanged. The pain is worse on the right side. There has been no fever. The pain is at a severity of 4/10. The pain is mild. Associated symptoms include congestion and coughing. Pertinent negatives include no abdominal pain, diarrhea, drooling, ear discharge, ear pain, headaches, hoarse voice, plugged ear sensation, neck pain, shortness of breath, stridor, swollen glands, trouble swallowing or vomiting. He has tried gargles for the symptoms. The treatment provided mild relief.  Hypertension This is a chronic problem. The current episode started more than 1 year ago. The problem has been waxing and waning since onset. The problem is uncontrolled. Pertinent  negatives include no blurred vision, chest pain, headaches, malaise/fatigue, neck pain, orthopnea, palpitations, peripheral edema, PND, shortness of breath or sweats. Risk factors for coronary artery disease include stress, sedentary lifestyle, male gender and obesity. Past treatments include ACE inhibitors and diuretics. The current treatment provides moderate improvement. Compliance problems include exercise and psychosocial issues.     Past medical history:Bipolar 2 d/o, tobacco abuse, HTN, GERD, OSA not yet confirmed (going through sleep study now), right ankle joint injury Medications: Tegretol, Topamax, Trazodone, xaxax, celexa, zestoretic  Allergies: reviewed allergy section in the chart Social: he smokes 1.5 ppd and doesn't drink alcohol much because of his GERD. Lives in [place], [place] and lives with himself.    Review of Systems  Constitutional: Positive for activity change. Negative for chills, malaise/fatigue, appetite change, fatigue and unexpected weight change.  HENT: Positive for congestion, rhinorrhea, sinus pressure, sneezing and sore throat. Negative for drooling, ear discharge, ear pain, hoarse voice, postnasal drip, trouble swallowing and voice change.   Eyes: Negative for blurred vision and visual disturbance.  Respiratory: Positive for apnea, cough and wheezing. Negative for choking, chest tightness, shortness of breath and stridor.   Cardiovascular: Negative for chest pain, palpitations, orthopnea and PND.  Gastrointestinal: Negative for nausea, vomiting, abdominal pain, diarrhea and constipation.  Endocrine: Negative for cold intolerance, heat intolerance, polydipsia and polyuria.  Genitourinary: Negative for dysuria, urgency and difficulty urinating.  Musculoskeletal: Negative for arthralgias, back pain, gait problem and neck pain.  Skin: Negative for color change and rash.  Allergic/Immunologic: Negative for environmental allergies.  Neurological: Negative for  dizziness, seizures, syncope, weakness, numbness and headaches.  Hematological: Negative for adenopathy. Does not bruise/bleed easily.  Psychiatric/Behavioral: Positive for sleep disturbance. Negative for suicidal ideas, hallucinations, behavioral problems, confusion, self-injury and agitation. The patient is nervous/anxious. The patient is not hyperactive.        Objective:   Physical Exam  Nursing note and vitals reviewed. Constitutional: He is oriented to person, place, and time.  obesed WM in NAD  HENT:  Head: Normocephalic and atraumatic.  Right Ear: External ear normal.  Left Ear: External ear normal.  Mouth/Throat: Oropharynx is clear and moist.  Mallampati Score 4  Boggy nasal turbinates R>L with clear discharge  Eyes: Conjunctivae are normal. Pupils are equal, round, and reactive to light.  Neck: Normal range of motion. Neck supple. No tracheal deviation present. No thyromegaly present.  Cardiovascular: Normal rate, regular rhythm, normal heart sounds and intact distal pulses.   Pulmonary/Chest: Effort normal. No stridor. No respiratory distress. He has wheezes.  Abdominal: Soft. Bowel sounds are normal. He exhibits no distension. There is no tenderness.  Lymphadenopathy:    He has no cervical adenopathy.  Neurological: He is alert and oriented to person, place, and time.  Skin: Skin is warm and dry.  Psychiatric: He has a normal mood and affect.  His behavior is normal. Judgment and thought content normal.      Assessment & Plan:  Kyair was seen today for new patient and sore throat.  Diagnoses and associated orders for this visit:  Sore throat  Bipolar 2 disorder - Ambulatory referral to Psychiatry  Referred for management of medication therapy - Ambulatory referral to Psychiatry  Tobacco abuse  HTN (hypertension) - Cancel: Basic metabolic panel - Basic metabolic panel; Future  Asthma exacerbation, non-allergic, mild persistent - beclomethasone (QVAR) 80  MCG/ACT inhaler; Inhale 1 puff into the lungs 2 (two) times daily. - predniSONE (DELTASONE) 20 MG tablet; Take 2 tabs po daily for 3 days, take 1 tab po daily for 3 days, 1/2 tab po daily for 3 days then stop - albuterol (PROVENTIL HFA;VENTOLIN HFA) 108 (90 BASE) MCG/ACT inhaler; Inhale 2 puffs into the lungs every 6 (six) hours as needed for wheezing. Shortness of breath  Unspecified sleep apnea  Allergic rhinitis - fluticasone (FLONASE) 50 MCG/ACT nasal spray; Place 2 sprays into the nose daily.   Sore throat likely viral in nature. Instructed to continue with symptomatic care. Will add flonase with hx of allergies.   Refer to Psychiatry for management of Bipolar 2 disorder and medications.  Continue to counsel on smoking cessation  Sent in prednisone taper with Qvar BID and albuterol inhaler to use prn. Follow up in 1-2 weeks.  He is to finish and get records from sleep study from [facility]. When arrives, will refer to Dr. Gerilyn Pilgrim for continued management.  BP elevated today but he denies any symptoms and takes his medications around noon after eating. I've noted his last lab work to have low sodium and he says he's followed up since then but since on these medications i.e ace inhibitor and diuretic, he is to go to lab and get BMP.

## 2013-03-22 ENCOUNTER — Ambulatory Visit: Payer: Medicaid Other | Admitting: Family Medicine

## 2013-04-16 ENCOUNTER — Encounter: Payer: Self-pay | Admitting: Family Medicine

## 2013-04-16 ENCOUNTER — Ambulatory Visit (INDEPENDENT_AMBULATORY_CARE_PROVIDER_SITE_OTHER): Payer: Medicaid Other | Admitting: Family Medicine

## 2013-04-16 VITALS — BP 142/86 | HR 82 | Temp 98.0°F | Resp 20 | Ht 66.0 in | Wt 293.2 lb

## 2013-04-16 DIAGNOSIS — R55 Syncope and collapse: Secondary | ICD-10-CM | POA: Insufficient documentation

## 2013-04-16 DIAGNOSIS — K219 Gastro-esophageal reflux disease without esophagitis: Secondary | ICD-10-CM | POA: Insufficient documentation

## 2013-04-16 DIAGNOSIS — IMO0001 Reserved for inherently not codable concepts without codable children: Secondary | ICD-10-CM | POA: Insufficient documentation

## 2013-04-16 DIAGNOSIS — Z8659 Personal history of other mental and behavioral disorders: Secondary | ICD-10-CM | POA: Insufficient documentation

## 2013-04-16 DIAGNOSIS — G4733 Obstructive sleep apnea (adult) (pediatric): Secondary | ICD-10-CM

## 2013-04-16 DIAGNOSIS — R0600 Dyspnea, unspecified: Secondary | ICD-10-CM | POA: Insufficient documentation

## 2013-04-16 DIAGNOSIS — I10 Essential (primary) hypertension: Secondary | ICD-10-CM

## 2013-04-16 DIAGNOSIS — R0609 Other forms of dyspnea: Secondary | ICD-10-CM

## 2013-04-16 DIAGNOSIS — F17200 Nicotine dependence, unspecified, uncomplicated: Secondary | ICD-10-CM

## 2013-04-16 NOTE — Patient Instructions (Signed)
Hypertension As your heart beats, it forces blood through your arteries. This force is your blood pressure. If the pressure is too high, it is called hypertension (HTN) or high blood pressure. HTN is dangerous because you may have it and not know it. High blood pressure may mean that your heart has to work harder to pump blood. Your arteries may be narrow or stiff. The extra work puts you at risk for heart disease, stroke, and other problems.  Blood pressure consists of two numbers, a higher number over a lower, 110/72, for example. It is stated as "110 over 72." The ideal is below 120 for the top number (systolic) and under 80 for the bottom (diastolic). Write down your blood pressure today. You should pay close attention to your blood pressure if you have certain conditions such as:  Heart failure.  Prior heart attack.  Diabetes  Chronic kidney disease.  Prior stroke.  Multiple risk factors for heart disease. To see if you have HTN, your blood pressure should be measured while you are seated with your arm held at the level of the heart. It should be measured at least twice. A one-time elevated blood pressure reading (especially in the Emergency Department) does not mean that you need treatment. There may be conditions in which the blood pressure is different between your right and left arms. It is important to see your caregiver soon for a recheck. Most people have essential hypertension which means that there is not a specific cause. This type of high blood pressure may be lowered by changing lifestyle factors such as:  Stress.  Smoking.  Lack of exercise.  Excessive weight.  Drug/tobacco/alcohol use.  Eating less salt. Most people do not have symptoms from high blood pressure until it has caused damage to the body. Effective treatment can often prevent, delay or reduce that damage. TREATMENT  When a cause has been identified, treatment for high blood pressure is directed at the  cause. There are a large number of medications to treat HTN. These fall into several categories, and your caregiver will help you select the medicines that are best for you. Medications may have side effects. You should review side effects with your caregiver. If your blood pressure stays high after you have made lifestyle changes or started on medicines,   Your medication(s) may need to be changed.  Other problems may need to be addressed.  Be certain you understand your prescriptions, and know how and when to take your medicine.  Be sure to follow up with your caregiver within the time frame advised (usually within two weeks) to have your blood pressure rechecked and to review your medications.  If you are taking more than one medicine to lower your blood pressure, make sure you know how and at what times they should be taken. Taking two medicines at the same time can result in blood pressure that is too low. SEEK IMMEDIATE MEDICAL CARE IF:  You develop a severe headache, blurred or changing vision, or confusion.  You have unusual weakness or numbness, or a faint feeling.  You have severe chest or abdominal pain, vomiting, or breathing problems. MAKE SURE YOU:   Understand these instructions.  Will watch your condition.  Will get help right away if you are not doing well or get worse. Document Released: 04/22/2005 Document Revised: 07/15/2011 Document Reviewed: 12/11/2007 [employer] Patient Information 2014 [employer]. Smoking Cessation Quitting smoking is important to your health and has many advantages. However, it is   not always easy to quit since nicotine is a very addictive drug. Often times, people try 3 times or more before being able to quit. This document explains the best ways for you to prepare to quit smoking. Quitting takes hard work and a lot of effort, but you can do it. ADVANTAGES OF QUITTING SMOKING  You will live longer, feel better, and live better.  Your body  will feel the impact of quitting smoking almost immediately.  Within 20 minutes, blood pressure decreases. Your pulse returns to its normal level.  After 8 hours, carbon monoxide levels in the blood return to normal. Your oxygen level increases.  After 24 hours, the chance of having a heart attack starts to decrease. Your breath, hair, and body stop smelling like smoke.  After 48 hours, damaged nerve endings begin to recover. Your sense of taste and smell improve.  After 72 hours, the body is virtually free of nicotine. Your bronchial tubes relax and breathing becomes easier.  After 2 to 12 weeks, lungs can hold more air. Exercise becomes easier and circulation improves.  The risk of having a heart attack, stroke, cancer, or lung disease is greatly reduced.  After 1 year, the risk of coronary heart disease is cut in half.  After 5 years, the risk of stroke falls to the same as a nonsmoker.  After 10 years, the risk of lung cancer is cut in half and the risk of other cancers decreases significantly.  After 15 years, the risk of coronary heart disease drops, usually to the level of a nonsmoker.  If you are pregnant, quitting smoking will improve your chances of having a healthy baby.  The people you live with, especially any children, will be healthier.  You will have extra money to spend on things other than cigarettes. QUESTIONS TO THINK ABOUT BEFORE ATTEMPTING TO QUIT You may want to talk about your answers with your caregiver.  Why do you want to quit?  If you tried to quit in the past, what helped and what did not?  What will be the most difficult situations for you after you quit? How will you plan to handle them?  Who can help you through the tough times? Your family? Friends? A caregiver?  What pleasures do you get from smoking? What ways can you still get pleasure if you quit? Here are some questions to ask your caregiver:  How can you help me to be successful at  quitting?  What medicine do you think would be best for me and how should I take it?  What should I do if I need more help?  What is smoking withdrawal like? How can I get information on withdrawal? GET READY  Set a quit date.  Change your environment by getting rid of all cigarettes, ashtrays, matches, and lighters in your home, car, or work. Do not let people smoke in your home.  Review your past attempts to quit. Think about what worked and what did not. GET SUPPORT AND ENCOURAGEMENT You have a better chance of being successful if you have help. You can get support in many ways.  Tell your family, friends, and co-workers that you are going to quit and need their support. Ask them not to smoke around you.  Get individual, group, or telephone counseling and support. Programs are available at local hospitals and health centers. Call your local health department for information about programs in your area.  Spiritual beliefs and practices may help some smokers   quit.  Download a "quit meter" on your computer to keep track of quit statistics, such as how long you have gone without smoking, cigarettes not smoked, and money saved.  Get a self-help book about quitting smoking and staying off of tobacco. LEARN NEW SKILLS AND BEHAVIORS  Distract yourself from urges to smoke. Talk to someone, go for a walk, or occupy your time with a task.  Change your normal routine. Take a different route to work. Drink tea instead of coffee. Eat breakfast in a different place.  Reduce your stress. Take a hot bath, exercise, or read a book.  Plan something enjoyable to do every day. Reward yourself for not smoking.  Explore interactive web-based programs that specialize in helping you quit. GET MEDICINE AND USE IT CORRECTLY Medicines can help you stop smoking and decrease the urge to smoke. Combining medicine with the above behavioral methods and support can greatly increase your chances of  successfully quitting smoking.  Nicotine replacement therapy helps deliver nicotine to your body without the negative effects and risks of smoking. Nicotine replacement therapy includes nicotine gum, lozenges, inhalers, nasal sprays, and skin patches. Some may be available over-the-counter and others require a prescription.  Antidepressant medicine helps people abstain from smoking, but how this works is unknown. This medicine is available by prescription.  Nicotinic receptor partial agonist medicine simulates the effect of nicotine in your brain. This medicine is available by prescription. Ask your caregiver for advice about which medicines to use and how to use them based on your health history. Your caregiver will tell you what side effects to look out for if you choose to be on a medicine or therapy. Carefully read the information on the package. Do not use any other product containing nicotine while using a nicotine replacement product.  RELAPSE OR DIFFICULT SITUATIONS Most relapses occur within the first 3 months after quitting. Do not be discouraged if you start smoking again. Remember, most people try several times before finally quitting. You may have symptoms of withdrawal because your body is used to nicotine. You may crave cigarettes, be irritable, feel very hungry, cough often, get headaches, or have difficulty concentrating. The withdrawal symptoms are only temporary. They are strongest when you first quit, but they will go away within 10 14 days. To reduce the chances of relapse, try to:  Avoid drinking alcohol. Drinking lowers your chances of successfully quitting.  Reduce the amount of caffeine you consume. Once you quit smoking, the amount of caffeine in your body increases and can give you symptoms, such as a rapid heartbeat, sweating, and anxiety.  Avoid smokers because they can make you want to smoke.  Do not let weight gain distract you. Many smokers will gain weight when they  quit, usually less than 10 pounds. Eat a healthy diet and stay active. You can always lose the weight gained after you quit.  Find ways to improve your mood other than smoking. FOR MORE INFORMATION  www.smokefree.gov  Document Released: 04/16/2001 Document Revised: 10/22/2011 Document Reviewed: 08/01/2011 [employer] Patient Information 2014 [employer].  

## 2013-04-16 NOTE — Progress Notes (Signed)
Subjective:     Patient ID: [name], male   DOB: June 10, 1980, 32 y.o.   MRN: [id number]  HPI Comments: [name] is a 32 y.o WM here for follow up.  He was seen initially in November to establish care. He has a hx of HTN, OSA untreated, GERD, bipolar with psychiatric issues, asthma, tobacco abuse. At the initial visit, he had complaints of sore throat and cough with wheeze. He was given prednisone and refills on his albuterol as well as prescribed qvar for him. He was also given flonase. He was told to follow up here in 1-2 weeks but he never showed up.  He has HTN and was on zestoretic. At that visit, he was on his blood pressure medicine and was to go to the lab to get BMP. He didn't do this as well. His blood pressure is good today. He denies any chest pains or headaches.   For his psychiatric issues, he was referred to psychiatry. He says he has an appt on Jan 8 and he sees Dr. Betti Cruz, his previous psychiatrist at that time. He does mention to me 2 previous episodes on last week where he had a black out spell on Saturday and Tuesday.  On Saturday, he says that he was sitting  At home with his friends. He was talking and he just blacked out and started shaking. He says he doesn't remember what happened but he can recall waking up and his friends tell him he needed to go to the ER. He declined this because he felt better. He says the incident on Tuesday occurred after he had gotten home and he blacked out. He woke up on the floor but doesn't remember anything from that. He felt fine so he says he didn't go the the ER at that point either. He does live by himself. He denies any suicidal thoughts or attempts.     Review of Systems  Constitutional: Negative for activity change, appetite change, fatigue and unexpected weight change.  HENT: Negative for congestion, postnasal drip, rhinorrhea, sinus pressure, sore throat and voice change.   Eyes: Negative for pain and visual disturbance.  Respiratory:  Positive for cough, shortness of breath and wheezing. Negative for apnea and chest tightness.   Cardiovascular: Negative for chest pain and palpitations.  Gastrointestinal: Negative for nausea, vomiting, abdominal pain, diarrhea and constipation.  Endocrine: Negative for polydipsia and polyuria.  Genitourinary: Negative for dysuria.  Musculoskeletal: Negative for back pain and joint swelling.  Allergic/Immunologic: Negative for environmental allergies and immunocompromised state.  Neurological: Positive for syncope. Negative for dizziness, seizures, weakness, numbness and headaches.  Psychiatric/Behavioral: Positive for sleep disturbance and agitation. Negative for suicidal ideas, hallucinations and self-injury. The patient is nervous/anxious. The patient is not hyperactive.        Objective:   Physical Exam  Nursing note and vitals reviewed. Constitutional: He is oriented to person, place, and time. He appears well-developed and well-nourished.  HENT:  Head: Normocephalic and atraumatic.  Right Ear: External ear normal.  Left Ear: External ear normal.  Nose: Nose normal.  Mouth/Throat: Oropharynx is clear and moist.  Cardiovascular: Normal rate, regular rhythm and normal heart sounds.   Pulmonary/Chest: Effort normal. He has wheezes.  Abdominal: Soft. Bowel sounds are normal. He exhibits no distension.  Neurological: He is alert and oriented to person, place, and time.  Skin: Skin is warm and dry.  Psychiatric: He has a normal mood and affect.       Assessment:  [name] was seen today for follow-up.  Diagnoses and associated orders for this visit:  HTN (hypertension) - Basic metabolic panel; Future - Basic metabolic panel  Smoking  Blackout spell - EEG; Future - CBC with Differential; Future - TSH; Future - T4, free; Future - EEG - CBC with Differential - TSH - T4, free  Hx of bipolar disorder  GERD (gastroesophageal reflux disease)  OSA (obstructive sleep  apnea) - CBC with Differential; Future - TSH; Future - T4, free; Future - CBC with Differential - TSH - T4, free  Dyspnea - CBC with Differential; Future - TSH; Future - T4, free; Future - CBC with Differential - TSH - T4, free        Plan:     Counseled on smoking cessation. He does state that he has cut back but he does continue to smoke.  Getting BMP to check renal function and other labs due to these black out spells. Setting up EEG to rule out seizures.  See Psych as scheduled for management of bipolar disorder.  He was allergic to Dexilant and will avoid PPI. He seems unsure if he reacted to the Prilosec and plus he says there are times it doesn't work. Will avoid that and he says zantac didn't work.  He is to get sleep study report from [facility] so I can refer to Dr Gerilyn Pilgrim. He hasn't got CPAP yet and will get this as well as get the report for me to be faxed to me as well.

## 2013-04-22 ENCOUNTER — Ambulatory Visit (HOSPITAL_COMMUNITY)
Admission: RE | Admit: 2013-04-22 | Discharge: 2013-04-22 | Disposition: A | Discharge status: Home / Self Care | Payer: Medicaid Other | Source: Ambulatory Visit | Attending: Family Medicine | Admitting: Family Medicine

## 2013-04-22 DIAGNOSIS — R55 Syncope and collapse: Secondary | ICD-10-CM

## 2013-04-22 LAB — CBC WITH DIFFERENTIAL/PLATELET
Basophils Absolute: 0 10*3/uL (ref 0.0–0.1)
Eosinophils Relative: 1 % (ref 0–5)
HCT: 42.4 % (ref 39.0–52.0)
Lymphocytes Relative: 19 % (ref 12–46)
Lymphs Abs: 2.1 10*3/uL (ref 0.7–4.0)
MCV: 88.1 fL (ref 78.0–100.0)
Monocytes Absolute: 0.6 10*3/uL (ref 0.1–1.0)
Monocytes Relative: 5 % (ref 3–12)
Neutro Abs: 8.3 10*3/uL — ABNORMAL HIGH (ref 1.7–7.7)
RBC: 4.81 MIL/uL (ref 4.22–5.81)
WBC: 11.2 10*3/uL — ABNORMAL HIGH (ref 4.0–10.5)

## 2013-04-22 NOTE — Progress Notes (Signed)
Out patient EEG completed at [facility]; results pending

## 2013-04-23 LAB — BASIC METABOLIC PANEL
BUN: 12 mg/dL (ref 6–23)
Chloride: 107 mEq/L (ref 96–112)
Glucose, Bld: 111 mg/dL — ABNORMAL HIGH (ref 70–99)
Potassium: 3.8 mEq/L (ref 3.5–5.3)
Sodium: 139 mEq/L (ref 135–145)

## 2013-04-23 LAB — TSH: TSH: 0.857 u[IU]/mL (ref 0.350–4.500)

## 2013-04-27 ENCOUNTER — Other Ambulatory Visit: Payer: Self-pay | Admitting: Family Medicine

## 2013-04-27 ENCOUNTER — Encounter: Payer: Self-pay | Admitting: Family Medicine

## 2013-05-01 NOTE — Procedures (Signed)
[facility] Chizaram Latino A. Gerilyn Pilgrim, MD     [email or web address]        NAMETRINO, HIGINBOTHAM               ACCOUNT NO.:  [id number]  MEDICAL RECORD NO.:  [id number]  LOCATION:  EE                           FACILITY:  [facility]  PHYSICIAN:  Ja Pistole A. Gerilyn Pilgrim, M.D. DATE OF BIRTH:  [birth year]  DATE OF PROCEDURE: DATE OF DISCHARGE:  04/22/2013                             EEG INTERPRETATION   HISTORY:  The patient is a 32 year old who presents with recurrent spells of syncope, suspicious for possible seizures.  MEDICATIONS:  Albuterol, Xanax, Flonase, hydrocodone, Advil, lisinopril, hydrochlorothiazide, Prilosec, prednisone, trazodone.  ANALYSIS:  A 16-channel recording using standard 10/20 measurement was carried out for 22 minutes.  There is a well-formed posterior dominant rhythm of 10 Hz, which attenuates with eye opening.  There is beta activity observed in the frontal areas.  Awake and drowsy activities are recorded.  Photic stimulation is carried out without abnormal changes in the background activity.  There is no focal or lateralized slowing. There is no epileptiform activity observed.  IMPRESSION:  Normal recording of awake and drowsy states.     Madi Bonfiglio A. Gerilyn Pilgrim, M.D.     KAD/MEDQ  D:  05/01/2013  T:  05/01/2013  Job:  [id number]

## 2013-05-03 ENCOUNTER — Ambulatory Visit: Payer: Medicaid Other | Admitting: Family Medicine

## 2013-05-05 ENCOUNTER — Encounter: Payer: Self-pay | Admitting: Family Medicine

## 2013-05-05 ENCOUNTER — Ambulatory Visit (INDEPENDENT_AMBULATORY_CARE_PROVIDER_SITE_OTHER): Payer: Medicaid Other | Admitting: Family Medicine

## 2013-05-05 VITALS — BP 148/90 | HR 90 | Temp 98.7°F | Resp 16 | Ht 67.0 in | Wt 286.0 lb

## 2013-05-05 DIAGNOSIS — F17200 Nicotine dependence, unspecified, uncomplicated: Secondary | ICD-10-CM

## 2013-05-05 DIAGNOSIS — R739 Hyperglycemia, unspecified: Secondary | ICD-10-CM | POA: Insufficient documentation

## 2013-05-05 DIAGNOSIS — G4733 Obstructive sleep apnea (adult) (pediatric): Secondary | ICD-10-CM

## 2013-05-05 DIAGNOSIS — F341 Dysthymic disorder: Secondary | ICD-10-CM

## 2013-05-05 DIAGNOSIS — R7309 Other abnormal glucose: Secondary | ICD-10-CM

## 2013-05-05 DIAGNOSIS — Z720 Tobacco use: Secondary | ICD-10-CM

## 2013-05-05 DIAGNOSIS — R109 Unspecified abdominal pain: Secondary | ICD-10-CM

## 2013-05-05 DIAGNOSIS — K219 Gastro-esophageal reflux disease without esophagitis: Secondary | ICD-10-CM

## 2013-05-05 DIAGNOSIS — J209 Acute bronchitis, unspecified: Secondary | ICD-10-CM

## 2013-05-05 DIAGNOSIS — R55 Syncope and collapse: Secondary | ICD-10-CM

## 2013-05-05 DIAGNOSIS — F418 Other specified anxiety disorders: Secondary | ICD-10-CM

## 2013-05-05 DIAGNOSIS — J440 Chronic obstructive pulmonary disease with acute lower respiratory infection: Secondary | ICD-10-CM

## 2013-05-05 LAB — HEMOGLOBIN A1C
Hgb A1c MFr Bld: 6 % — ABNORMAL HIGH (ref <5.7)
Mean Plasma Glucose: 126 mg/dL — ABNORMAL HIGH (ref <117)

## 2013-05-05 MED ORDER — IPRATROPIUM BROMIDE HFA 17 MCG/ACT IN AERS: 2.0000 | INHALATION_SPRAY | Freq: Four times a day (QID) | RESPIRATORY_TRACT | Status: DC

## 2013-05-05 MED ORDER — DOXYCYCLINE HYCLATE 100 MG PO TABS: 100.0000 mg | ORAL_TABLET | Freq: Two times a day (BID) | ORAL | Status: DC

## 2013-05-05 MED ORDER — METOCLOPRAMIDE HCL 5 MG/5ML PO SOLN: 10.0000 mg | Freq: Three times a day (TID) | ORAL | Status: DC | PRN

## 2013-05-05 NOTE — Patient Instructions (Signed)
Helicobacter Pylori Antibodies Test This is a blood test which looks for a germ called Helicobacter pylori. This can also be diagnosed by a breath test or a microscopic examination of a portion (biopsy) of the small bowel. H. pylori is a germ that is found in the cells that line the stomach. It is a risk factor for stomach and small bowel ulcers, long standing inflammation of the lining of the stomach, or even ulcers that may occur in the esophagus (the canal that runs from the mouth to the stomach). This bacterium is also a factor in stomach cancer. The amount of the bacteria is found in about 10% of healthy persons younger than 33 years of age and the amount of the bacteria increases with age. Most persons with these bacteria have no symptoms; however, it is thought that when these bacteria cause ulcers, antibiotic medications can be used to help eliminate or reduce the problem.  PREPARATION FOR TEST No preparation or fasting is necessary. NORMAL FINDINGS Negative (H-pylori bacteria not present) Ranges for normal findingsmay vary among different laboratories and hospitals. You should always check with your doctor after having lab work or other tests done to discuss the meaning of your test results and whether or not your values are considered within normal limits. MEANING OF TEST  Your caregiver will go over the test results with you and discuss the importance and meaning of your results, as well as treatment options and the need for additional tests if necessary. It is your responsibility to obtain your test results. Ask the lab or department performing the test when and how you will get your results. OBTAINING THE TEST RESULTS It is your responsibility to obtain your test results. Ask the lab or department performing the test when and how you will get your results. Document Released: 05/16/2004 Document Revised: 07/15/2011 Document Reviewed: 04/02/2008 [facility] Patient Information 2014 [place],  [place].  Chronic Obstructive Pulmonary Disease Chronic obstructive pulmonary disease (COPD) is a condition in which airflow from the lungs is restricted. The lungs can never return to normal, but there are measures you can take which will improve them and make you feel better. CAUSES   Smoking.  Exposure to secondhand smoke.  Breathing in irritants such as air pollution, dust, cigarette smoke, strong odors, aerosol sprays, or paint fumes.  History of lung infections. SYMPTOMS   Deep, persistent (chronic) cough with a large amount of thick mucus.  Wheezing.  Shortness of breath, especially with physical activity.  Feeling like you cannot get enough air.  Difficulty breathing.  Rapid breaths (tachypnea).  Gray or bluish discoloration (cyanosis) of the skin, especially in fingers, toes, or lips.  Fatigue.  Weight loss.  Swelling in legs, ankles, or feet.  Fast heartbeat (tachycardia).  Frequent lung infections.   Chest tightness. DIAGNOSIS  Initial diagnosis may be based on your history, symptoms, and physical examination. Additional tests for COPD may include:  Chest X-ray.  Computed tomography (CT) scan.  Lung (pulmonary) function tests.  Blood tests. TREATMENT  Treatment focuses on making you comfortable (supportive care). Your caregiver may prescribe medicines (inhaled or pills) to help improve your breathing. Additional treatment options may include oxygen therapy and pulmonary rehabilitation. Treatment should also include reducing your exposure to known irritants and following a plan to stop smoking. HOME CARE INSTRUCTIONS   Take all medicines, including antibiotic medicines, as directed by your caregiver.  Use inhaled medicines as directed by your caregiver.  Avoid medicines or cough syrups that dry up  your airway (antihistamines) and slow down the elimination of secretions. This decreases respiratory capacity and may lead to infections.  If you smoke,  stop smoking.  Avoid exposure to smoke, chemicals, and fumes that aggravate your breathing.  Avoid contact with individuals that have a contagious illness.  Avoid extreme temperature and humidity changes.  Use humidifiers at home and at your bedside if they do not make breathing difficult.  Drink enough water and fluids to keep your urine clear or pale yellow. This loosens secretions.  Eat healthy foods. Eating smaller, more frequent meals and resting before meals may help you maintain your strength.  Ask your caregiver about the use of vitamins and mineral supplements.  Stay active. Exercise and physical activity will help maintain your ability to do things you want to do.  Balance activity with periods of rest.  Assume a position of comfort if you become short of breath.  Learn and use relaxation techniques.  Learn and use controlled breathing techniques as directed by your caregiver. Controlled breathing techniques include:  Pursed lip breathing. This breathing technique starts with breathing in (inhaling) through your nose for 1 second. Next, purse your lips as if you were going to whistle. Then breathe out (exhale) through the pursed lips for 2 seconds.  Diaphragmatic breathing. Start by putting one hand on your abdomen just above your waist. Inhale slowly through your nose. The hand on your abdomen should move out. Then exhale slowly through pursed lips. You should be able to feel the hand on your abdomen moving in as you exhale.  Learn and use controlled coughing to clear mucus from your lungs. Controlled coughing is a series of short, progressive coughs. The steps of controlled coughing are: 1. Lean your head slightly forward. 2. Breathe in deeply using diaphragmatic breathing. 3. Try to hold your breath for 3 seconds. 4. Keep your mouth slightly open while coughing twice. 5. Spit any mucus out into a tissue. 6. Rest and repeat the steps once or twice as needed.  Receive  all protective vaccines your caregiver suggests, especially pneumococcal and influenza vaccines.  Learn to manage stress.  Schedule and attend all follow-up appointments as directed by your caregiver. It is important to keep all your appointments.  Participate in pulmonary rehabilitation as directed by your caregiver.  Use home oxygen as suggested. SEEK MEDICAL CARE IF:   You are coughing up more mucus than usual.  There is a change in the color or thickness of the mucus.  Breathing is more labored than usual.  Your breathing is faster than usual.  Your skin color is more cyanotic than usual.  You are running out of the medicine you take for your breathing.  You are anxious, apprehensive, or restless.  You have a fever. SEEK IMMEDIATE MEDICAL CARE IF:   You have a rapid heart rate.  You have shortness of breath while you are resting.  You have shortness of breath that prevents you from being able to talk.  You have shortness of breath that prevents you from performing your usual physical activities.  You have chest pain lasting longer than 5 minutes.  You have a seizure.  Your family or friends notice that you are agitated or confused. MAKE SURE YOU:   Understand these instructions.  Will watch your condition.  Will get help right away if you are not doing well or get worse. Document Released: 01/30/2005 Document Revised: 01/15/2012 Document Reviewed: 12/17/2012 [facility] Patient Information 2014 [place], [place].

## 2013-05-05 NOTE — Progress Notes (Signed)
Subjective:    Patient ID: [name], male    DOB: 08-12-1980, 32 y.o.   MRN: [id number]  HPI Comments: GERD: Paitent complains of heartburn. This has been associated with abdominal bloating, heartburn, midespigastric pain, nausea, nocturnal burning, upper abdominal discomfort and wheezing.  He denies bilious reflux, chest pain, choking on food, deep pressure at base of neck, difficulty swallowing, dysphagia, early satiety, hematemesis, hoarseness, need to clear throat frequently, odynophagia, unexpected weight loss and waterbrash. Symptoms have been present for several years. He denies dysphagia.  He has not lost weight. He denies melena, hematochezia, hematemesis, and coffee ground emesis. Medical therapy in the past has included antacids, H2 antagonists and proton pump inhibitors without relief. He has also seen GI in the past, Dr Darrick Penna and [facility] in [place], [place].    COPD: Patient complains of cough, wheezing, increased sputum and colored sputum. Symptoms began 1 week ago. Symptoms wheezing does worsen with exertion. Sputum is yellow in moderate amounts. Fever has been chills. Patient uses 1 pillows at night. Patient can walk a good number of  feet before resting. Patient currently is not on home oxygen therapy.Marland Kitchen Respiratory history: asthma, frequent episodes of bronchitis and likely has COPD due to long hx of smoking. He was on Albuterol and Qvar and this hasn't helped.   Anxiety: Patient complains of anxiety disorder and bipolar disorder.  He has the following symptoms: insomnia, irritable, psychomotor agitation. Onset of symptoms was approximately several years ago, unchanged since that time. He denies current suicidal and homicidal ideation. Family history significant for anxiety and depression.Possible organic causes contributing are: none. Risk factors: positive family history in  father and mother and negative life event in which he is currently dealing with a custody battle  over his daughter. Previous treatment includes Xanax and individual therapy.  He complains of the following side effects from the treatment: none. He has gotten back in with Dr Betti Cruz, his previous psychiatrist. He saw him last week and was placed back on Xanax 1 mg TID. He says this has helped with anxiety and his sleep at night.   He also has OSA and has been through sleep study. He has been prescribed a CPAP machine but has not picked this up yet. He says he is afraid to try it. He does have anxiety and is afraid he won't tolerate the mask. He also says the lady at the pharmacist told him about getting it paid for by his insurance. He was told he would have to wear it at least 3-4 hours a night. He was confused by this, but I clarified with him today. The mask is paid for and he will need to be adherent and compliant with wearing it nightly.       Past Medical History  Diagnosis Date  . GERD (gastroesophageal reflux disease)     Bravo study Nov 2009 on Prilosec 40 BID with adequate acid suppression  . Hypertension   . Anxiety   . Depression     increased over past several months  . Chronic abdominal pain     HIDA scan Sept 2010, EF 93%, s/p chole oct 2010; evaluated at [facility] for abdominal wall pain; Imipramine added May 2011  . Chronic vomiting     normal GES 2009  . S/P endoscopy     2008 Dr. Karilyn Cota: erosive reflux esophagitis, antral gastritis, H.pylori serologies neg, Nov 2009 Dr. Darrick Penna: gastritis, benign esophageal polyp, no H.pylori,  Feb 2011: [facility], Dr.  Burggen: normal esophagus, normal stomach, normal duodenum, path unremarkable  . Ventral hernia   . Shortness of breath     SOB even with no exertion  . Sleep apnea     supposed to sleep with CPAP  . Hiatal hernia   . Bipolar 2 disorder    Past Surgical History  Procedure Laterality Date  . Ventral hernia repair  March 2012    Dr. Lovell Sheehan  . Knee surgery  2009  . Cholecystectomy  10/10  . Appendectomy  5/10  .  Esophagogastroduodenoscopy  05/2006    H.pylori neg  . Inguinal hernia repair      child  . Esophagogastroduodenoscopy  03/23/2008    Fields-gastritis benign esophageal polyp, otherwise normal. Negative H. pylori he (propofol)  . Incisional hernia repair  03/04/2012    Procedure: LAPAROSCOPIC INCISIONAL HERNIA;  Surgeon: Dalia Heading, MD;  Location: [facility];  Service: General;  Laterality: N/A;  repair, recurrent  . Insertion of mesh  03/04/2012    Procedure: INSERTION OF MESH;  Surgeon: Dalia Heading, MD;  Location: [facility];  Service: General;  Laterality: N/A;    Current Outpatient Prescriptions on File Prior to Visit  Medication Sig Dispense Refill  . ALPRAZolam (XANAX) 1 MG tablet Take 1 mg by mouth 4 (four) times daily.        . fluticasone (FLONASE) 50 MCG/ACT nasal spray Place 2 sprays into the nose daily.  16 g  3  . HYDROcodone-acetaminophen (NORCO/VICODIN) 5-325 MG per tablet Take 1 tablet by mouth every 4 (four) hours as needed.  15 tablet  0  . lisinopril-hydrochlorothiazide (PRINZIDE,ZESTORETIC) 20-25 MG per tablet Take 1 tablet by mouth daily.        . traZODone (DESYREL) 150 MG tablet Take 150 mg by mouth at bedtime as needed for sleep.       No current facility-administered medications on file prior to visit.     Review of Systems  Constitutional: Negative for activity change, appetite change, fatigue and unexpected weight change.  HENT: Negative for trouble swallowing.   Eyes: Negative for visual disturbance.  Respiratory: Positive for apnea, cough, shortness of breath and wheezing. Negative for choking and chest tightness.   Cardiovascular: Negative for palpitations.  Gastrointestinal: Positive for abdominal pain. Negative for nausea, vomiting, diarrhea, constipation and blood in stool.       Midepigastric pain/burning  Endocrine: Negative for cold intolerance, heat intolerance, polydipsia and polyuria.  Genitourinary: Negative for dysuria, urgency, flank pain,  decreased urine volume and difficulty urinating.  Musculoskeletal: Negative for gait problem and neck stiffness.  Skin: Negative for color change and wound.  Allergic/Immunologic: Positive for environmental allergies. Negative for immunocompromised state.  Neurological: Negative for dizziness, weakness, numbness and headaches.       Hx of blacking out spells x 2; has been referred for EEG. Pending results.   Psychiatric/Behavioral: Positive for sleep disturbance and agitation. Negative for suicidal ideas, hallucinations, behavioral problems, confusion, self-injury, dysphoric mood and decreased concentration. The patient is nervous/anxious. The patient is not hyperactive.        Objective:   Physical Exam  Nursing note and vitals reviewed. Constitutional: He is oriented to person, place, and time.  obesed WM in NAD, audible wheezing during encounter   HENT:  Head: Normocephalic and atraumatic.  Right Ear: External ear normal.  Left Ear: External ear normal.  Mouth/Throat: Oropharynx is clear and moist.  Eyes: Conjunctivae are normal. Pupils are equal, round, and reactive to light.  Neck: Normal range of motion.  Cardiovascular: Normal rate, regular rhythm, normal heart sounds and intact distal pulses.   Pulmonary/Chest: Effort normal. No respiratory distress. He has wheezes. He exhibits no tenderness.  Abdominal: Soft. Bowel sounds are normal. He exhibits no mass. There is no tenderness. There is no rebound and no guarding.  Surgical/lap scars to abdomen, with stria  Neurological: He is alert and oriented to person, place, and time.  Skin: Skin is warm and dry.  Psychiatric: He has a normal mood and affect. His behavior is normal. Judgment and thought content normal.      Assessment & Plan:  GERD (gastroesophageal reflux disease) - Plan: Helicobacter pylori abs-IgG+IgA, bld, metoCLOPramide (REGLAN) 5 MG/5ML solution -unable to order GI cocktail; will do a trial of Reglan. See back in  1 week. Per medical chart review, has extensive workup of his GI symptoms; has had EGD in the past. He did well with Prilosec BID in the past but now, he says it no longer is effective.   Abdominal cramping - Plan: Helicobacter pylori abs-IgG+IgA, bld  Tobacco abuse - Plan: Helicobacter pylori abs-IgG+IgA, bld Counseled on smoking cessation. He has also mentioned Chantix to Dr. Betti Cruz. Dr. Betti Cruz has deferred this until his anxiety is better controlled. My opinion is Chantix may not be suitable for this patient with a psychiatric history. He may do better with Wellbutrin which will help with weight loss, smoking cessation, and mood. Will follow up on this.  COPD (chronic obstructive pulmonary disease) with acute bronchitis - Plan: ipratropium (ATROVENT HFA) 17 MCG/ACT inhaler, doxycycline (VIBRA-TABS) 100 MG tablet  OSA (obstructive sleep apnea) -advised to get CPAP mask and begin wearing.  Depression with anxiety -continue med per Dr. Betti Cruz.  Hyperglycemia - Plan: Hemoglobin A1c  Black-out (not amnesia) -has advised against driving until EEG returns.  Results still pending. He says he no longer has any more episodes.   To see back in 1 week.

## 2013-05-07 ENCOUNTER — Encounter: Payer: Self-pay | Admitting: Family Medicine

## 2013-05-07 LAB — HELICOBACTER PYLORI ABS-IGG+IGA, BLD
H Pylori IgG: 0.78 {ISR}
HELICOBACTER PYLORI AB, IGA: 2 U/mL (ref <9.0)

## 2013-05-12 ENCOUNTER — Ambulatory Visit: Payer: Medicaid Other | Admitting: Family Medicine

## 2013-05-15 ENCOUNTER — Encounter (HOSPITAL_COMMUNITY): Payer: Self-pay | Admitting: Emergency Medicine

## 2013-05-15 ENCOUNTER — Emergency Department (HOSPITAL_COMMUNITY): Payer: Medicaid Other

## 2013-05-15 ENCOUNTER — Emergency Department (HOSPITAL_COMMUNITY)
Admission: EM | Admit: 2013-05-15 | Discharge: 2013-05-15 | Disposition: A | Discharge status: Home / Self Care | Payer: Medicaid Other | Attending: Emergency Medicine | Admitting: Emergency Medicine

## 2013-05-15 DIAGNOSIS — S9030XA Contusion of unspecified foot, initial encounter: Secondary | ICD-10-CM | POA: Insufficient documentation

## 2013-05-15 DIAGNOSIS — F329 Major depressive disorder, single episode, unspecified: Secondary | ICD-10-CM | POA: Insufficient documentation

## 2013-05-15 DIAGNOSIS — Y9389 Activity, other specified: Secondary | ICD-10-CM | POA: Insufficient documentation

## 2013-05-15 DIAGNOSIS — S8000XA Contusion of unspecified knee, initial encounter: Secondary | ICD-10-CM | POA: Insufficient documentation

## 2013-05-15 DIAGNOSIS — Z9889 Other specified postprocedural states: Secondary | ICD-10-CM | POA: Insufficient documentation

## 2013-05-15 DIAGNOSIS — S9031XA Contusion of right foot, initial encounter: Secondary | ICD-10-CM

## 2013-05-15 DIAGNOSIS — F411 Generalized anxiety disorder: Secondary | ICD-10-CM | POA: Insufficient documentation

## 2013-05-15 DIAGNOSIS — Y929 Unspecified place or not applicable: Secondary | ICD-10-CM | POA: Insufficient documentation

## 2013-05-15 DIAGNOSIS — I10 Essential (primary) hypertension: Secondary | ICD-10-CM | POA: Insufficient documentation

## 2013-05-15 DIAGNOSIS — Z792 Long term (current) use of antibiotics: Secondary | ICD-10-CM | POA: Insufficient documentation

## 2013-05-15 DIAGNOSIS — M19079 Primary osteoarthritis, unspecified ankle and foot: Secondary | ICD-10-CM | POA: Insufficient documentation

## 2013-05-15 DIAGNOSIS — W208XXA Other cause of strike by thrown, projected or falling object, initial encounter: Secondary | ICD-10-CM | POA: Insufficient documentation

## 2013-05-15 DIAGNOSIS — IMO0002 Reserved for concepts with insufficient information to code with codable children: Secondary | ICD-10-CM | POA: Insufficient documentation

## 2013-05-15 DIAGNOSIS — F3289 Other specified depressive episodes: Secondary | ICD-10-CM | POA: Insufficient documentation

## 2013-05-15 DIAGNOSIS — F17200 Nicotine dependence, unspecified, uncomplicated: Secondary | ICD-10-CM | POA: Insufficient documentation

## 2013-05-15 DIAGNOSIS — S8002XA Contusion of left knee, initial encounter: Secondary | ICD-10-CM

## 2013-05-15 DIAGNOSIS — Z8719 Personal history of other diseases of the digestive system: Secondary | ICD-10-CM | POA: Insufficient documentation

## 2013-05-15 DIAGNOSIS — Z79899 Other long term (current) drug therapy: Secondary | ICD-10-CM | POA: Insufficient documentation

## 2013-05-15 DIAGNOSIS — G8929 Other chronic pain: Secondary | ICD-10-CM | POA: Insufficient documentation

## 2013-05-15 MED ORDER — OXYCODONE-ACETAMINOPHEN 5-325 MG PO TABS
1.0000 | ORAL_TABLET | Freq: Once | ORAL | Status: AC
  Administered 2013-05-15: 1 via ORAL
  Filled 2013-05-15: qty 1

## 2013-05-15 MED ORDER — KNEE SLEEVE/KIDS LARGE MISC: 1.0000 | Status: DC | PRN

## 2013-05-15 NOTE — ED Provider Notes (Signed)
CSN: [id number]     Arrival date & time 05/15/13  1834 History   First MD Initiated Contact with Patient 05/15/13 1910     Chief Complaint  Patient presents with  . Foot Pain  . Knee Pain   (Consider location/radiation/quality/duration/timing/severity/associated sxs/prior Treatment) HPI  Patient presents to the ED for evaluation of injury to right foot and left knee. Patient was cutting a tree when a piece of large tree fell on his foot and hit his knee on the way down. He is limping, says it is very swollen, and bruising. He is concerned he may have broken it. 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 Denies head injury, neck injury, denies loc.  Past Medical History  Diagnosis Date  . GERD (gastroesophageal reflux disease)     Bravo study Nov 2009 on Prilosec 40 BID with adequate acid suppression  . Hypertension   . Anxiety   . Depression     increased over past several months  . Chronic abdominal pain     HIDA scan Sept 2010, EF 93%, s/p chole oct 2010; evaluated at [facility] for abdominal wall pain; Imipramine added May 2011  . Chronic vomiting     normal GES 2009  . S/P endoscopy     2008 Dr. Karilyn Cota: erosive reflux esophagitis, antral gastritis, H.pylori serologies neg, Nov 2009 Dr. Darrick Penna: gastritis, benign esophageal polyp, no H.pylori,  Feb 2011: [facility], Dr. Bubba Hales: normal esophagus, normal stomach, normal duodenum, path unremarkable  . Ventral hernia   . Shortness of breath     SOB even with no exertion  . Sleep apnea     supposed to sleep with CPAP  . Hiatal hernia   . Bipolar 2 disorder    Past Surgical History  Procedure Laterality Date  . Ventral hernia repair  March 2012    Dr. Lovell Sheehan  . Knee surgery  2009  . Cholecystectomy  10/10  . Appendectomy  5/10  . Esophagogastroduodenoscopy  05/2006    H.pylori neg  . Inguinal hernia repair      child  . Esophagogastroduodenoscopy  03/23/2008    Fields-gastritis benign esophageal  polyp, otherwise normal. Negative H. pylori he (propofol)  . Incisional hernia repair  03/04/2012    Procedure: LAPAROSCOPIC INCISIONAL HERNIA;  Surgeon: Dalia Heading, MD;  Location: [facility];  Service: General;  Laterality: N/A;  repair, recurrent  . Insertion of mesh  03/04/2012    Procedure: INSERTION OF MESH;  Surgeon: Dalia Heading, MD;  Location: [facility];  Service: General;  Laterality: N/A;   Family History  Problem Relation Age of Onset  . Colon cancer Neg Hx   . Diabetes Mother    History  Substance Use Topics  . Smoking status: Current Every Day Smoker -- 1.00 packs/day for 15 years    Types: Cigarettes  . Smokeless tobacco: Never Used     Comment: Less than 1/2 pk a day. trying to quit.  03/04/12 1255 states "was doing good, but now up to about a pack per day"  . Alcohol Use: No    Review of Systems The patient denies anorexia, fever, weight loss,, vision loss, decreased hearing, hoarseness, chest pain, syncope, dyspnea on exertion, peripheral edema, balance deficits, hemoptysis, abdominal pain, melena, hematochezia, severe indigestion/heartburn, hematuria, incontinence, genital sores, muscle weakness, suspicious skin lesions, transient blindness, difficulty walking, depression, unusual weight change, abnormal bleeding, enlarged lymph nodes, angioedema, and breast masses.  Allergies  Dexilant; Propoxyphene n-acetaminophen; Zofran;  and Zofran odt  Home Medications   Current Outpatient Rx  Name  Route  Sig  Dispense  Refill  . ALPRAZolam (XANAX) 1 MG tablet   Oral   Take 1 mg by mouth 3 (three) times daily.          Marland Kitchen doxycycline (VIBRA-TABS) 100 MG tablet   Oral   Take 1 tablet (100 mg total) by mouth 2 (two) times daily.   14 tablet   0   . HYDROcodone-acetaminophen (NORCO/VICODIN) 5-325 MG per tablet   Oral   Take 1 tablet by mouth every 4 (four) hours as needed.   15 tablet   0   . ibuprofen (ADVIL,MOTRIN) 200 MG tablet   Oral   Take 800 mg by mouth  daily as needed for headache, mild pain or moderate pain.         Marland Kitchen ipratropium (ATROVENT HFA) 17 MCG/ACT inhaler   Inhalation   Inhale 2 puffs into the lungs 4 (four) times daily.   1 Inhaler   2   . lisinopril-hydrochlorothiazide (PRINZIDE,ZESTORETIC) 20-25 MG per tablet   Oral   Take 1 tablet by mouth daily.           . metoCLOPramide (REGLAN) 5 MG/5ML solution   Oral   Take 10 mLs (10 mg total) by mouth every 8 (eight) hours as needed (GERD refractory to PPI/H2 blocker).   120 mL   0   . UNKNOWN TO PATIENT   Oral   Take 1 tablet by mouth at bedtime. SLEEP AID-NAME IS UNKNOWN (ODT TABLET)         . fluticasone (FLONASE) 50 MCG/ACT nasal spray   Nasal   Place 2 sprays into the nose daily.   16 g   3    BP 135/75  Pulse 82  Temp(Src) 98.6 F (37 C) (Oral)  Resp 20  Ht 5\' 9"  (1.753 m)  Wt 280 lb (127.007 kg)  BMI 41.33 kg/m2  SpO2 97% Physical Exam  Nursing note and vitals reviewed. Constitutional: He appears well-developed and well-nourished. No distress.  HENT:  Head: Normocephalic and atraumatic.  Eyes: Pupils are equal, round, and reactive to light.  Neck: Normal range of motion. Neck supple.  Cardiovascular: Normal rate and regular rhythm.   Pulmonary/Chest: Effort normal.  Abdominal: Soft.  Musculoskeletal:       Left knee: He exhibits decreased range of motion, swelling and ecchymosis. He exhibits no laceration. Tenderness found. Medial joint line and patellar tendon tenderness noted.       Legs:      Right foot: He exhibits decreased range of motion, tenderness, bony tenderness and swelling. He exhibits normal capillary refill, no crepitus, no deformity and no laceration.  Strong pedal pulses bilaterally  Neurological: He is alert.  Skin: Skin is warm and dry.    ED Course  Procedures (including critical care time) Labs Review Labs Reviewed - No data to display Imaging Review Dg Knee Complete 4 Views Left  05/15/2013   CLINICAL DATA:   Recent traumatic injury with pain  EXAM: LEFT KNEE - COMPLETE 4+ VIEW  COMPARISON:  None.  FINDINGS: There is no evidence of fracture, dislocation, or joint effusion. There is no evidence of arthropathy or other focal bone abnormality. Soft tissues are unremarkable.  IMPRESSION: No acute abnormality noted.   Electronically Signed   By: Alcide Clever M.D.   On: 05/15/2013 19:25   Dg Foot Complete Right  05/15/2013   CLINICAL DATA:  Recent  traumatic injury with swelling  EXAM: RIGHT FOOT COMPLETE - 3+ VIEW  COMPARISON:  None.  FINDINGS: No acute fracture or dislocation is noted. Considerable soft tissue swelling is noted in the midportion of the foot.  IMPRESSION: Soft tissue swelling without acute bony abnormality.   Electronically Signed   By: Alcide CleverMark  Lukens M.D.   On: 05/15/2013 19:24    EKG Interpretation   None       MDM   1. Foot contusion, right, initial encounter   2. Knee contusion, left, initial encounter    No boney fracture but significant contusions. Pt currently treated for other injuries by Dr. Hilda LiasKeeling. Is on Vicodin 7.5 at home. Will give 1 dose of Percocet in the ED. He needs to adhere strictly to RICE protocol.  He will follow-up with ortho, right cam-walker boot given, left knee sleeve, and crutches.  33 y.o.Roselyn BeringEdmund D Freeney's evaluation in the Emergency Department is complete. It has been determined that no acute conditions requiring further emergency intervention are present at this time. The patient/guardian have been advised of the diagnosis and plan. We have discussed signs and symptoms that warrant return to the ED, such as changes or worsening in symptoms.  Vital signs are stable at discharge. Filed Vitals:   05/15/13 1905  BP: 135/75  Pulse: 82  Temp: 98.6 F (37 C)  Resp: 20    Patient/guardian has voiced understanding and agreed to follow-up with the PCP or specialist.      Dorthula Matasiffany G Arlesia Kiel, PA-C 05/15/13 2016

## 2013-05-15 NOTE — Discharge Instructions (Signed)
Foot Contusion A foot contusion is a deep bruise to the foot. Contusions are the result of an injury that caused bleeding under the skin. The contusion may turn blue, purple, or yellow. Minor injuries will give you a painless contusion, but more severe contusions may stay painful and swollen for a few weeks. CAUSES  A foot contusion comes from a direct blow to that area, such as a heavy object falling on the foot. SYMPTOMS   Swelling of the foot.  Discoloration of the foot.  Tenderness or soreness of the foot. DIAGNOSIS  You will have a physical exam and will be asked about your history. You may need an X-ray of your foot to look for a broken bone (fracture).  TREATMENT  An elastic wrap may be recommended to support your foot. Resting, elevating, and applying cold compresses to your foot are often the best treatments for a foot contusion. Over-the-counter medicines may also be recommended for pain control. HOME CARE INSTRUCTIONS   Put ice on the injured area.  Put ice in a plastic bag.  Place a towel between your skin and the bag.  Leave the ice on for 15-20 minutes, 03-04 times a day.  Only take over-the-counter or prescription medicines for pain, discomfort, or fever as directed by your caregiver.  If told, use an elastic wrap as directed. This can help reduce swelling. You may remove the wrap for sleeping, showering, and bathing. If your toes become numb, cold, or blue, take the wrap off and reapply it more loosely.  Elevate your foot with pillows to reduce swelling.  Try to avoid standing or walking while the foot is painful. Do not resume use until instructed by your caregiver. Then, begin use gradually. If pain develops, decrease use. Gradually increase activities that do not cause discomfort until you have normal use of your foot.  See your caregiver as directed. It is very important to keep all follow-up appointments in order to avoid any lasting problems with your foot,  including long-term (chronic) pain. SEEK IMMEDIATE MEDICAL CARE IF:   You have increased redness, swelling, or pain in your foot.  Your swelling or pain is not relieved with medicines.  You have loss of feeling in your foot or are unable to move your toes.  Your foot turns cold or blue.  You have pain when you move your toes.  Your foot becomes warm to the touch.  Your contusion does not improve in 2 days. MAKE SURE YOU:   Understand these instructions.  Will watch your condition.  Will get help right away if you are not doing well or get worse. Document Released: 02/11/2006 Document Revised: 10/22/2011 Document Reviewed: 03/26/2011 [facility] Patient Information 2014 [place], [place].  Knee Effusion The medical term for having fluid in your knee is effusion. This is often due to an internal derangement of the knee. This means something is wrong inside the knee. Some of the causes of fluid in the knee may be torn cartilage, a torn ligament, or bleeding into the joint from an injury. Your knee is likely more difficult to bend and move. This is often because there is increased pain and pressure in the joint. The time it takes for recovery from a knee effusion depends on different factors, including:   Type of injury.  Your age.  Physical and medical conditions.  Rehabilitation Strategies. How long you will be away from your normal activities will depend on what kind of knee problem you have and how  much damage is present. Your knee has two types of cartilage. Articular cartilage covers the bone ends and lets your knee bend and move smoothly. Two menisci, thick pads of cartilage that form a rim inside the joint, help absorb shock and stabilize your knee. Ligaments bind the bones together and support your knee joint. Muscles move the joint, help support your knee, and take stress off the joint itself. CAUSES  Often an effusion in the knee is caused by an injury to one of the menisci.  This is often a tear in the cartilage. Recovery after a meniscus injury depends on how much meniscus is damaged and whether you have damaged other knee tissue. Small tears may heal on their own with conservative treatment. Conservative means rest, limited weight bearing activity and muscle strengthening exercises. Your recovery may take up to 6 weeks.  TREATMENT  Larger tears may require surgery. Meniscus injuries may be treated during arthroscopy. Arthroscopy is a procedure in which your surgeon uses a small telescope like instrument to look in your knee. Your caregiver can make a more accurate diagnosis (learning what is wrong) by performing an arthroscopic procedure. If your injury is on the inner margin of the meniscus, your surgeon may trim the meniscus back to a smooth rim. In other cases your surgeon will try to repair a damaged meniscus with stitches (sutures). This may make rehabilitation take longer, but may provide better long term result by helping your knee keep its shock absorption capabilities. Ligaments which are completely torn usually require surgery for repair. HOME CARE INSTRUCTIONS  Use crutches as instructed.  If a brace is applied, use as directed.  Once you are home, an ice pack applied to your swollen knee may help with discomfort and help decrease swelling.  Keep your knee raised (elevated) when you are not up and around or on crutches.  Only take over-the-counter or prescription medicines for pain, discomfort, or fever as directed by your caregiver.  Your caregivers will help with instructions for rehabilitation of your knee. This often includes strengthening exercises.  You may resume a normal diet and activities as directed. SEEK MEDICAL CARE IF:   There is increased swelling in your knee.  You notice redness, swelling, or increasing pain in your knee.  An unexplained oral temperature above 102 F (38.9 C) develops. SEEK IMMEDIATE MEDICAL CARE IF:   You  develop a rash.  You have difficulty breathing.  You have any allergic reactions from medications you may have been given.  There is severe pain with any motion of the knee. MAKE SURE YOU:   Understand these instructions.  Will watch your condition.  Will get help right away if you are not doing well or get worse. Document Released: 07/13/2003 Document Revised: 07/15/2011 Document Reviewed: 09/16/2007 [facility] Patient Information 2014 [place]

## 2013-05-15 NOTE — ED Notes (Signed)
Pt reports was cutting a tree down and large branch hit r foot and left knee.  Bruise noted to left knee.

## 2013-05-16 NOTE — ED Provider Notes (Signed)
Medical screening examination/treatment/procedure(s) were performed by non-physician practitioner and as supervising physician I was immediately available for consultation/collaboration.  EKG Interpretation   None        Jeffrey HornJohn M Neddie Steedman, MD 05/16/13 1246

## 2013-06-02 ENCOUNTER — Other Ambulatory Visit: Payer: Self-pay | Admitting: Family Medicine

## 2013-06-02 NOTE — Telephone Encounter (Signed)
Pt was seen and started on this medication on 05/05/2013, he was supposed to f/u in one week. He no showed his f/u appointment. Do you want me to refill this medication?

## 2013-06-02 NOTE — Telephone Encounter (Signed)
I need to see him for follow up first before I refill. I don't know if this medicine works or not.

## 2013-06-02 NOTE — Telephone Encounter (Signed)
OK, thanks

## 2013-06-21 ENCOUNTER — Ambulatory Visit: Payer: Medicaid Other | Admitting: Family Medicine

## 2013-06-23 ENCOUNTER — Encounter: Payer: Self-pay | Admitting: Family Medicine

## 2013-06-23 ENCOUNTER — Ambulatory Visit (INDEPENDENT_AMBULATORY_CARE_PROVIDER_SITE_OTHER): Payer: Medicaid Other | Admitting: Family Medicine

## 2013-06-23 VITALS — BP 142/90 | HR 84 | Temp 98.4°F | Resp 20 | Ht 66.0 in | Wt 285.2 lb

## 2013-06-23 DIAGNOSIS — Z7189 Other specified counseling: Secondary | ICD-10-CM

## 2013-06-23 DIAGNOSIS — R059 Cough, unspecified: Secondary | ICD-10-CM

## 2013-06-23 DIAGNOSIS — F17200 Nicotine dependence, unspecified, uncomplicated: Secondary | ICD-10-CM

## 2013-06-23 DIAGNOSIS — Z716 Tobacco abuse counseling: Secondary | ICD-10-CM | POA: Insufficient documentation

## 2013-06-23 DIAGNOSIS — K219 Gastro-esophageal reflux disease without esophagitis: Secondary | ICD-10-CM

## 2013-06-23 DIAGNOSIS — I10 Essential (primary) hypertension: Secondary | ICD-10-CM

## 2013-06-23 DIAGNOSIS — G4733 Obstructive sleep apnea (adult) (pediatric): Secondary | ICD-10-CM

## 2013-06-23 DIAGNOSIS — R05 Cough: Secondary | ICD-10-CM

## 2013-06-23 MED ORDER — BENZONATATE 100 MG PO CAPS: 100.0000 mg | ORAL_CAPSULE | Freq: Three times a day (TID) | ORAL | Status: DC | PRN

## 2013-06-23 MED ORDER — METOCLOPRAMIDE HCL 5 MG/5ML PO SOLN: 10.0000 mg | Freq: Three times a day (TID) | ORAL | Status: DC

## 2013-06-23 MED ORDER — LISINOPRIL-HYDROCHLOROTHIAZIDE 20-25 MG PO TABS: 1.0000 | ORAL_TABLET | Freq: Every day | ORAL | Status: DC

## 2013-06-23 NOTE — Patient Instructions (Addendum)
Smoking Cessation Quitting smoking is important to your health and has many advantages. However, it is not always easy to quit since nicotine is a very addictive drug. Often times, people try 3 times or more before being able to quit. This document explains the best ways for you to prepare to quit smoking. Quitting takes hard work and a lot of effort, but you can do it. ADVANTAGES OF QUITTING SMOKING  You will live longer, feel better, and live better.  Your body will feel the impact of quitting smoking almost immediately.  Within 20 minutes, blood pressure decreases. Your pulse returns to its normal level.  After 8 hours, carbon monoxide levels in the blood return to normal. Your oxygen level increases.  After 24 hours, the chance of having a heart attack starts to decrease. Your breath, hair, and body stop smelling like smoke.  After 48 hours, damaged nerve endings begin to recover. Your sense of taste and smell improve.  After 72 hours, the body is virtually free of nicotine. Your bronchial tubes relax and breathing becomes easier.  After 2 to 12 weeks, lungs can hold more air. Exercise becomes easier and circulation improves.  The risk of having a heart attack, stroke, cancer, or lung disease is greatly reduced.  After 1 year, the risk of coronary heart disease is cut in half.  After 5 years, the risk of stroke falls to the same as a nonsmoker.  After 10 years, the risk of lung cancer is cut in half and the risk of other cancers decreases significantly.  After 15 years, the risk of coronary heart disease drops, usually to the level of a nonsmoker.  If you are pregnant, quitting smoking will improve your chances of having a healthy baby.  The people you live with, especially any children, will be healthier.  You will have extra money to spend on things other than cigarettes. QUESTIONS TO THINK ABOUT BEFORE ATTEMPTING TO QUIT You may want to talk about your answers with your  caregiver.  Why do you want to quit?  If you tried to quit in the past, what helped and what did not?  What will be the most difficult situations for you after you quit? How will you plan to handle them?  Who can help you through the tough times? Your family? Friends? A caregiver?  What pleasures do you get from smoking? What ways can you still get pleasure if you quit? Here are some questions to ask your caregiver:  How can you help me to be successful at quitting?  What medicine do you think would be best for me and how should I take it?  What should I do if I need more help?  What is smoking withdrawal like? How can I get information on withdrawal? GET READY  Set a quit date.  Change your environment by getting rid of all cigarettes, ashtrays, matches, and lighters in your home, car, or work. Do not let people smoke in your home.  Review your past attempts to quit. Think about what worked and what did not. GET SUPPORT AND ENCOURAGEMENT You have a better chance of being successful if you have help. You can get support in many ways.  Tell your family, friends, and co-workers that you are going to quit and need their support. Ask them not to smoke around you.  Get individual, group, or telephone counseling and support. Programs are available at local hospitals and health centers. Call your local health department for   information about programs in your area.  Spiritual beliefs and practices may help some smokers quit.  Download a "quit meter" on your computer to keep track of quit statistics, such as how long you have gone without smoking, cigarettes not smoked, and money saved.  Get a self-help book about quitting smoking and staying off of tobacco. LEARN NEW SKILLS AND BEHAVIORS  Distract yourself from urges to smoke. Talk to someone, go for a walk, or occupy your time with a task.  Change your normal routine. Take a different route to work. Drink tea instead of coffee.  Eat breakfast in a different place.  Reduce your stress. Take a hot bath, exercise, or read a book.  Plan something enjoyable to do every day. Reward yourself for not smoking.  Explore interactive web-based programs that specialize in helping you quit. GET MEDICINE AND USE IT CORRECTLY Medicines can help you stop smoking and decrease the urge to smoke. Combining medicine with the above behavioral methods and support can greatly increase your chances of successfully quitting smoking.  Nicotine replacement therapy helps deliver nicotine to your body without the negative effects and risks of smoking. Nicotine replacement therapy includes nicotine gum, lozenges, inhalers, nasal sprays, and skin patches. Some may be available over-the-counter and others require a prescription.  Antidepressant medicine helps people abstain from smoking, but how this works is unknown. This medicine is available by prescription.  Nicotinic receptor partial agonist medicine simulates the effect of nicotine in your brain. This medicine is available by prescription. Ask your caregiver for advice about which medicines to use and how to use them based on your health history. Your caregiver will tell you what side effects to look out for if you choose to be on a medicine or therapy. Carefully read the information on the package. Do not use any other product containing nicotine while using a nicotine replacement product.  RELAPSE OR DIFFICULT SITUATIONS Most relapses occur within the first 3 months after quitting. Do not be discouraged if you start smoking again. Remember, most people try several times before finally quitting. You may have symptoms of withdrawal because your body is used to nicotine. You may crave cigarettes, be irritable, feel very hungry, cough often, get headaches, or have difficulty concentrating. The withdrawal symptoms are only temporary. They are strongest when you first quit, but they will go away within  10 14 days. To reduce the chances of relapse, try to:  Avoid drinking alcohol. Drinking lowers your chances of successfully quitting.  Reduce the amount of caffeine you consume. Once you quit smoking, the amount of caffeine in your body increases and can give you symptoms, such as a rapid heartbeat, sweating, and anxiety.  Avoid smokers because they can make you want to smoke.  Do not let weight gain distract you. Many smokers will gain weight when they quit, usually less than 10 pounds. Eat a healthy diet and stay active. You can always lose the weight gained after you quit.  Find ways to improve your mood other than smoking. FOR MORE INFORMATION  www.smokefree.gov  Document Released: 04/16/2001 Document Revised: 10/22/2011 Document Reviewed: 08/01/2011 [facility] Patient Information 2014 [place], [place]. Sleep Apnea Sleep apnea is disorder that affects a person's sleep. A person with sleep apnea has abnormal pauses in their breathing when they sleep. It is hard for them to get a good sleep. This makes a person tired during the day. It also can lead to other physical problems. There are three types of sleep apnea.  One type is when breathing stops for a short time because your airway is blocked (obstructive sleep apnea). Another type is when the brain sometimes fails to give the normal signal to breathe to the muscles that control your breathing (central sleep apnea). The third type is a combination of the other two types. HOME CARE  Do not sleep on your back. Try to sleep on your side.  Take all medicine as told by your doctor.  Avoid alcohol, calming medicines (sedatives), and depressant drugs.  Try to lose weight if you are overweight. Talk to your doctor about a healthy weight goal. Your doctor may have you use a device that helps to open your airway. It can help you get the air that you need. It is called a positive airway pressure (PAP) device. There are three types of PAP  devices:  Continuous positive airway pressure (CPAP) device.  Nasal expiratory positive airway pressure (EPAP) device.  Bilevel positive airway pressure (BPAP) device. MAKE SURE YOU:  Understand these instructions.  Will watch your condition.  Will get help right away if you are not doing well or get worse. Document Released: 01/30/2008 Document Revised: 04/08/2012 Document Reviewed: 08/24/2011 [facility] Patient Information 2014 [place].

## 2013-06-23 NOTE — Progress Notes (Signed)
Subjective:     Patient ID: [name], male   DOB: 27-Dec-1980, 33 y.o.   MRN: [id number]  Cough This is a chronic problem. The current episode started more than 1 year ago. The problem has been waxing and waning. The problem occurs hourly. The cough is non-productive. Associated symptoms include heartburn and nasal congestion. Pertinent negatives include no chest pain, chills, ear congestion, ear pain, fever, headaches, hemoptysis, myalgias, postnasal drip, rhinorrhea, sore throat, shortness of breath or wheezing. The symptoms are aggravated by cold air. Risk factors for lung disease include smoking/tobacco exposure. He has tried a beta-agonist inhaler (atrovent inhaler was prescribed last visit in December) for the symptoms. The treatment provided moderate (he says the inhaler helped with his wheezing and cough somewhat but made his chest feel tight ) relief.  Hypertension This is a chronic problem. The current episode started more than 1 year ago. The problem is unchanged. The problem is controlled. Pertinent negatives include no anxiety, blurred vision, chest pain, headaches, malaise/fatigue, orthopnea, palpitations, peripheral edema or shortness of breath. There are no associated agents to hypertension. Risk factors for coronary artery disease include smoking/tobacco exposure. Past treatments include ACE inhibitors and diuretics. The current treatment provides significant improvement. There are no compliance problems.  Identifiable causes of hypertension include sleep apnea.  Gastrophageal Reflux He complains of coughing and heartburn. He reports no abdominal pain, no belching, no chest pain, no choking, no dysphagia, no early satiety, no globus sensation, no hoarse voice, no nausea, no sore throat, no stridor or no wheezing. This is a chronic problem. The current episode started more than 1 year ago. The problem occurs rarely. The problem has been gradually improving. The heartburn is located in  the substernum. The heartburn is of mild intensity. The heartburn does not wake him from sleep. The heartburn does not limit his activity. The heartburn doesn't change with position. The symptoms are aggravated by caffeine, certain foods and lying down. Risk factors include smoking/tobacco exposure and lack of exercise. He has tried a pro-motility drug for the symptoms. The treatment provided significant relief. Past procedures include an abdominal ultrasound, an EGD, H. pylori antibody titer and a UGI. he has had work up in the past which has revealed gastritis, esophageal polyp that's benign.  I recently did H pylori titer and was negative..   Past Medical History  Diagnosis Date  . GERD (gastroesophageal reflux disease)     Bravo study Nov 2009 on Prilosec 40 BID with adequate acid suppression  . Hypertension   . Anxiety   . Depression     increased over past several months  . Chronic abdominal pain     HIDA scan Sept 2010, EF 93%, s/p chole oct 2010; evaluated at [facility] for abdominal wall pain; Imipramine added May 2011  . Chronic vomiting     normal GES 2009  . S/P endoscopy     2008 Dr. Karilyn Cota: erosive reflux esophagitis, antral gastritis, H.pylori serologies neg, Nov 2009 Dr. Darrick Penna: gastritis, benign esophageal polyp, no H.pylori,  Feb 2011: [facility], Dr. Bubba Hales: normal esophagus, normal stomach, normal duodenum, path unremarkable  . Ventral hernia   . Shortness of breath     SOB even with no exertion  . Sleep apnea     supposed to sleep with CPAP  . Hiatal hernia   . Bipolar 2 disorder    Current Outpatient Prescriptions on File Prior to Visit  Medication Sig Dispense Refill  . ALPRAZolam (XANAX) 1 MG tablet Take  1 mg by mouth 3 (three) times daily.       . fluticasone (FLONASE) 50 MCG/ACT nasal spray Place 2 sprays into the nose daily.  16 g  3  . HYDROcodone-acetaminophen (NORCO/VICODIN) 5-325 MG per tablet Take 1 tablet by mouth every 4 (four) hours as needed.  15 tablet  0   . ibuprofen (ADVIL,MOTRIN) 200 MG tablet Take 800 mg by mouth daily as needed for headache, mild pain or moderate pain.      Clinical research associate Bandages & Supports (KNEE SLEEVE/KIDS LARGE) MISC 1 Device by Does not apply route as needed.  1 each  0   No current facility-administered medications on file prior to visit.     Review of Systems  Constitutional: Negative for fever, chills, malaise/fatigue, activity change and appetite change.  HENT: Negative for ear pain, hoarse voice, postnasal drip, rhinorrhea and sore throat.   Eyes: Negative for blurred vision and visual disturbance.  Respiratory: Positive for cough and chest tightness. Negative for hemoptysis, choking, shortness of breath and wheezing.   Cardiovascular: Negative for chest pain, palpitations and orthopnea.  Gastrointestinal: Positive for heartburn. Negative for dysphagia, nausea, vomiting, abdominal pain, diarrhea and constipation.       Heartburn symptoms are better after trial of Reglan   Genitourinary: Negative for dysuria and flank pain.  Musculoskeletal: Negative for myalgias.  Neurological: Negative for seizures, syncope, speech difficulty, weakness, numbness and headaches.  Hematological: Negative for adenopathy.  Psychiatric/Behavioral: Negative for behavioral problems. The patient is not nervous/anxious.        Objective:   Physical Exam  Nursing note and vitals reviewed. Constitutional: He is oriented to person, place, and time.  obesed WM in NAD  HENT:  Head: Normocephalic and atraumatic.  Right Ear: External ear normal.  Left Ear: External ear normal.  Nose: Nose normal.  Mouth/Throat: Oropharynx is clear and moist.  Eyes: Conjunctivae are normal. Pupils are equal, round, and reactive to light.  Neck: Normal range of motion. Neck supple.  Cardiovascular: Normal rate, regular rhythm and normal heart sounds.   Pulmonary/Chest: Effort normal and breath sounds normal. No respiratory distress. He has no wheezes.  He exhibits no tenderness.  Abdominal: Soft. Bowel sounds are normal. He exhibits no distension and no mass. There is no tenderness. There is no rebound and no guarding.  Neurological: He is alert and oriented to person, place, and time.  Skin: Skin is warm and dry.  Psychiatric: He has a normal mood and affect. His behavior is normal. Judgment and thought content normal.       Assessment:     Habib was seen today for follow-up.  Diagnoses and associated orders for this visit:  GERD (gastroesophageal reflux disease) - metoCLOPramide (REGLAN) 5 MG/5ML solution; Take 10 mLs (10 mg total) by mouth 4 (four) times daily -  before meals and at bedtime.  HTN (hypertension) - lisinopril-hydrochlorothiazide (PRINZIDE,ZESTORETIC) 20-25 MG per tablet; Take 1 tablet by mouth daily. - Basic metabolic panel; Future - Basic metabolic panel  Cough - benzonatate (TESSALON) 100 MG capsule; Take 1 capsule (100 mg total) by mouth 3 (three) times daily as needed for cough.  Tobacco abuse counseling - benzonatate (TESSALON) 100 MG capsule; Take 1 capsule (100 mg total) by mouth 3 (three) times daily as needed for cough.  OSA (obstructive sleep apnea)       Plan:     He requested refills on the Reglan and his zestoretic for HTN. These were sent in . Will get  BMP at some point in the near future when the weather is better for him to get to the lab.  Will follow up in 6 months if lab results are normal.  He was instructed to get in to get his CPAP and begin wearing. He was given a handout on outcomes for untreated OSA.  Counseled on smoking cessation. To readdress and have given tessalon perles for trial to be used for cough prn. To continue the atrovent.    Have counseled on exercise as this will help with weight loss, GERD symptoms, and OSA symptoms.

## 2013-08-10 ENCOUNTER — Other Ambulatory Visit: Payer: Self-pay | Admitting: Family Medicine

## 2013-08-30 ENCOUNTER — Emergency Department (HOSPITAL_COMMUNITY): Payer: Medicaid Other

## 2013-08-30 ENCOUNTER — Emergency Department (HOSPITAL_COMMUNITY)
Admission: EM | Admit: 2013-08-30 | Discharge: 2013-08-31 | Disposition: A | Discharge status: Home / Self Care | Payer: Medicaid Other | Attending: Emergency Medicine | Admitting: Emergency Medicine

## 2013-08-30 ENCOUNTER — Encounter (HOSPITAL_COMMUNITY): Payer: Self-pay | Admitting: Emergency Medicine

## 2013-08-30 DIAGNOSIS — F411 Generalized anxiety disorder: Secondary | ICD-10-CM | POA: Insufficient documentation

## 2013-08-30 DIAGNOSIS — G609 Hereditary and idiopathic neuropathy, unspecified: Secondary | ICD-10-CM | POA: Insufficient documentation

## 2013-08-30 DIAGNOSIS — G629 Polyneuropathy, unspecified: Secondary | ICD-10-CM

## 2013-08-30 DIAGNOSIS — Z791 Long term (current) use of non-steroidal anti-inflammatories (NSAID): Secondary | ICD-10-CM | POA: Insufficient documentation

## 2013-08-30 DIAGNOSIS — F3189 Other bipolar disorder: Secondary | ICD-10-CM | POA: Insufficient documentation

## 2013-08-30 DIAGNOSIS — Z9981 Dependence on supplemental oxygen: Secondary | ICD-10-CM | POA: Insufficient documentation

## 2013-08-30 DIAGNOSIS — Z9089 Acquired absence of other organs: Secondary | ICD-10-CM | POA: Insufficient documentation

## 2013-08-30 DIAGNOSIS — K219 Gastro-esophageal reflux disease without esophagitis: Secondary | ICD-10-CM | POA: Insufficient documentation

## 2013-08-30 DIAGNOSIS — Z9889 Other specified postprocedural states: Secondary | ICD-10-CM | POA: Insufficient documentation

## 2013-08-30 DIAGNOSIS — R209 Unspecified disturbances of skin sensation: Secondary | ICD-10-CM | POA: Insufficient documentation

## 2013-08-30 DIAGNOSIS — R630 Anorexia: Secondary | ICD-10-CM | POA: Insufficient documentation

## 2013-08-30 DIAGNOSIS — R109 Unspecified abdominal pain: Secondary | ICD-10-CM | POA: Insufficient documentation

## 2013-08-30 DIAGNOSIS — F17200 Nicotine dependence, unspecified, uncomplicated: Secondary | ICD-10-CM | POA: Insufficient documentation

## 2013-08-30 DIAGNOSIS — G4730 Sleep apnea, unspecified: Secondary | ICD-10-CM | POA: Insufficient documentation

## 2013-08-30 DIAGNOSIS — I10 Essential (primary) hypertension: Secondary | ICD-10-CM | POA: Insufficient documentation

## 2013-08-30 DIAGNOSIS — G8929 Other chronic pain: Secondary | ICD-10-CM | POA: Insufficient documentation

## 2013-08-30 DIAGNOSIS — Z79899 Other long term (current) drug therapy: Secondary | ICD-10-CM | POA: Insufficient documentation

## 2013-08-30 LAB — HEPATIC FUNCTION PANEL
ALK PHOS: 106 U/L (ref 39–117)
ALT: 23 U/L (ref 0–53)
AST: 18 U/L (ref 0–37)
Albumin: 4.3 g/dL (ref 3.5–5.2)
BILIRUBIN TOTAL: 0.6 mg/dL (ref 0.3–1.2)
Bilirubin, Direct: <0.2 mg/dL (ref 0.0–0.3)
Total Protein: 8.5 g/dL — ABNORMAL HIGH (ref 6.0–8.3)

## 2013-08-30 LAB — CBC WITH DIFFERENTIAL/PLATELET
BASOS PCT: 0 % (ref 0–1)
Basophils Absolute: 0 10*3/uL (ref 0.0–0.1)
EOS ABS: 0.1 10*3/uL (ref 0.0–0.7)
Eosinophils Relative: 1 % (ref 0–5)
HCT: 47.6 % (ref 39.0–52.0)
HEMOGLOBIN: 16.8 g/dL (ref 13.0–17.0)
Lymphocytes Relative: 21 % (ref 12–46)
Lymphs Abs: 2.3 10*3/uL (ref 0.7–4.0)
MCH: 31.9 pg (ref 26.0–34.0)
MCHC: 35.3 g/dL (ref 30.0–36.0)
MCV: 90.3 fL (ref 78.0–100.0)
MONOS PCT: 6 % (ref 3–12)
Monocytes Absolute: 0.6 10*3/uL (ref 0.1–1.0)
Neutro Abs: 7.9 10*3/uL — ABNORMAL HIGH (ref 1.7–7.7)
Neutrophils Relative %: 72 % (ref 43–77)
Platelets: 272 10*3/uL (ref 150–400)
RBC: 5.27 MIL/uL (ref 4.22–5.81)
RDW: 12.6 % (ref 11.5–15.5)
WBC: 11 10*3/uL — ABNORMAL HIGH (ref 4.0–10.5)

## 2013-08-30 LAB — BASIC METABOLIC PANEL
BUN: 12 mg/dL (ref 6–23)
CO2: 26 mEq/L (ref 19–32)
CREATININE: 0.96 mg/dL (ref 0.50–1.35)
Calcium: 10.3 mg/dL (ref 8.4–10.5)
Chloride: 96 mEq/L (ref 96–112)
GFR calc non Af Amer: >90 mL/min (ref >90)
Glucose, Bld: 121 mg/dL — ABNORMAL HIGH (ref 70–99)
Potassium: 4.3 mEq/L (ref 3.7–5.3)
Sodium: 137 mEq/L (ref 137–147)

## 2013-08-30 LAB — TROPONIN I: Troponin I: <0.3 ng/mL (ref <0.30)

## 2013-08-30 LAB — LIPASE, BLOOD: Lipase: 19 U/L (ref 11–59)

## 2013-08-30 MED ORDER — IBUPROFEN 800 MG PO TABS
800.0000 mg | ORAL_TABLET | Freq: Once | ORAL | Status: AC
  Administered 2013-08-30: 800 mg via ORAL
  Filled 2013-08-30: qty 1

## 2013-08-30 MED ORDER — ALPRAZOLAM 0.5 MG PO TABS
1.0000 mg | ORAL_TABLET | Freq: Once | ORAL | Status: AC
  Administered 2013-08-30: 1 mg via ORAL
  Filled 2013-08-30: qty 2

## 2013-08-30 NOTE — ED Notes (Addendum)
Patient reports abdominal pain and chest pain x 3 days, history of hernia and GERD. Reports pain and tingling to feet bilaterally when walking.

## 2013-08-31 MED ORDER — OXYCODONE-ACETAMINOPHEN 5-325 MG PO TABS
2.0000 | ORAL_TABLET | Freq: Once | ORAL | Status: AC
  Administered 2013-08-31: 2 via ORAL
  Filled 2013-08-31: qty 2

## 2013-08-31 MED ORDER — OXYCODONE-ACETAMINOPHEN 5-325 MG PO TABS: 1.0000 | ORAL_TABLET | ORAL | Status: DC | PRN

## 2013-08-31 NOTE — ED Provider Notes (Signed)
Patient seen/examined in the Emergency Department in conjunction with Midlevel Provider Idol Patient reports abd pain and foot pain Exam : pt sleeping on my evaluation.  abd soft and non tender Pt is ambulatory, no distress noted,distal pulses intact X2 in feet Plan: d/c home   Joya Gaskins, MD 08/31/13 [id number]

## 2013-08-31 NOTE — ED Provider Notes (Signed)
CSN: [id number]     Arrival date & time 08/30/13  1859 History   First MD Initiated Contact with Patient 08/30/13 2224     Chief Complaint  Patient presents with  . Peripheral Neuropathy  . Abdominal Pain     (Consider location/radiation/quality/duration/timing/severity/associated sxs/prior Treatment) The history is provided by the patient.    [name] is a 33 y.o. male presenting with a 3 day history of upper abdominal pain which reminds him of the pain he experienced when he was recovering from his ventral hernia surgery 3 years ago. He states it has never felt "right" and for the past 3 days has had increased aching pain.  He denies nausea, vomiting, fevers, shortness of breath, diarrhea or constipation, his last bm was yesterday and normal. He does have anorexia but has been able to maintain fluid intake.  He denies abdominal distention but reports when he stands he can feel a fullness in his upper abdomen.  He does have a history of gerd which is unchanged.  He has found no alleviators for his pain. His pain radiates into his mid chest.   Secondly, he describes burning pain and intermittent episodes of numbness in his feet, also present for the past 3 days.  He has a history of occasional low back pain which is not worsened today. He denies dysuria, hematuria and loss of bowel or bladder control.  Walking makes this symptom worse.     Past Medical History  Diagnosis Date  . GERD (gastroesophageal reflux disease)     Bravo study Nov 2009 on Prilosec 40 BID with adequate acid suppression  . Hypertension   . Anxiety   . Depression     increased over past several months  . Chronic abdominal pain     HIDA scan Sept 2010, EF 93%, s/p chole oct 2010; evaluated at [facility] for abdominal wall pain; Imipramine added May 2011  . Chronic vomiting     normal GES 2009  . S/P endoscopy     2008 Dr. Karilyn Cota: erosive reflux esophagitis, antral gastritis, H.pylori serologies neg, Nov 2009  Dr. Darrick Penna: gastritis, benign esophageal polyp, no H.pylori,  Feb 2011: [facility], Dr. Bubba Hales: normal esophagus, normal stomach, normal duodenum, path unremarkable  . Ventral hernia   . Shortness of breath     SOB even with no exertion  . Sleep apnea     supposed to sleep with CPAP  . Hiatal hernia   . Bipolar 2 disorder    Past Surgical History  Procedure Laterality Date  . Ventral hernia repair  March 2012    Dr. Lovell Sheehan  . Knee surgery  2009  . Cholecystectomy  10/10  . Appendectomy  5/10  . Esophagogastroduodenoscopy  05/2006    H.pylori neg  . Inguinal hernia repair      child  . Esophagogastroduodenoscopy  03/23/2008    Fields-gastritis benign esophageal polyp, otherwise normal. Negative H. pylori he (propofol)  . Incisional hernia repair  03/04/2012    Procedure: LAPAROSCOPIC INCISIONAL HERNIA;  Surgeon: Dalia Heading, MD;  Location: [facility];  Service: General;  Laterality: N/A;  repair, recurrent  . Insertion of mesh  03/04/2012    Procedure: INSERTION OF MESH;  Surgeon: Dalia Heading, MD;  Location: [facility];  Service: General;  Laterality: N/A;   Family History  Problem Relation Age of Onset  . Colon cancer Neg Hx   . Diabetes Mother    History  Substance Use Topics  . Smoking  status: Current Every Day Smoker -- 1.00 packs/day for 15 years    Types: Cigarettes  . Smokeless tobacco: Never Used     Comment: Less than 1/2 pk a day. trying to quit.  03/04/12 1255 states "was doing good, but now up to about a pack per day"  . Alcohol Use: No    Review of Systems  Constitutional: Negative for fever and chills.  HENT: Negative for congestion and sore throat.   Eyes: Negative.   Respiratory: Negative for chest tightness and shortness of breath.   Cardiovascular: Negative for chest pain.  Gastrointestinal: Positive for abdominal pain. Negative for nausea.  Genitourinary: Negative.   Musculoskeletal: Positive for arthralgias. Negative for joint swelling and neck pain.   Skin: Negative.  Negative for rash and wound.  Neurological: Positive for numbness. Negative for dizziness, weakness, light-headedness and headaches.  Psychiatric/Behavioral: Negative.       Allergies  Dexilant; Propoxyphene n-acetaminophen; Zofran; and Zofran odt  Home Medications   Prior to Admission medications   Medication Sig Start Date End Date Taking? Authorizing Provider  ALPRAZolam Prudy Feeler(XANAX) 1 MG tablet Take 1 mg by mouth 3 (three) times daily.    Yes Historical Provider, MD  HYDROcodone-acetaminophen (NORCO/VICODIN) 5-325 MG per tablet Take 1 tablet by mouth every 6 (six) hours as needed for moderate pain.   Yes Historical Provider, MD  ibuprofen (ADVIL,MOTRIN) 200 MG tablet Take 800 mg by mouth daily as needed for headache, mild pain or moderate pain.   Yes Historical Provider, MD  lisinopril-hydrochlorothiazide (PRINZIDE,ZESTORETIC) 20-25 MG per tablet Take 1 tablet by mouth daily. 06/23/13  Yes Kela MillinAlethea Y Barrino, MD  omeprazole (PRILOSEC) 40 MG capsule Take 40 mg by mouth 2 (two) times daily.   Yes Historical Provider, MD  oxyCODONE-acetaminophen (PERCOCET/ROXICET) 5-325 MG per tablet Take 1 tablet by mouth every 4 (four) hours as needed for severe pain. 08/31/13   Burgess AmorJulie Ailis Rigaud, PA-C   BP 107/60  Pulse 90  Temp(Src) 98.8 F (37.1 C) (Oral)  Resp 20  Ht 5\' 9"  (1.753 m)  Wt 270 lb (122.471 kg)  BMI 39.85 kg/m2  SpO2 90% Physical Exam  Nursing note and vitals reviewed. Constitutional: He is oriented to person, place, and time. He appears well-developed and well-nourished.  HENT:  Head: Normocephalic and atraumatic.  Eyes: Conjunctivae are normal.  Neck: Normal range of motion.  Cardiovascular: Normal rate, regular rhythm, normal heart sounds and intact distal pulses.   Pulmonary/Chest: Effort normal and breath sounds normal. He has no wheezes.  Abdominal: Soft. Bowel sounds are normal. He exhibits no distension and no mass. There is no tenderness. There is no rebound and  no guarding.  Musculoskeletal: Normal range of motion. He exhibits no edema and no tenderness.  Neurological: He is alert and oriented to person, place, and time. A sensory deficit is present. He exhibits normal muscle tone. Gait normal. He displays no Babinski's sign on the right side. He displays no Babinski's sign on the left side.  Reflex Scores:      Patellar reflexes are 1+ on the right side and 1+ on the left side. Decreased sensation to fine touch in bilateral dorsal feet.  Pedal pulses intact.  Decreased but equal resisted strength with plantar and dorsiflexion of ankles, poor effort.  He is ambulatory without foot drop.   Skin: Skin is warm and dry.  Psychiatric: He has a normal mood and affect.    ED Course  Procedures (including critical care time) Labs Review Labs Reviewed  CBC WITH DIFFERENTIAL - Abnormal; Notable for the following:    WBC 11.0 (*)    Neutro Abs 7.9 (*)    All other components within normal limits  BASIC METABOLIC PANEL - Abnormal; Notable for the following:    Glucose, Bld 121 (*)    All other components within normal limits  HEPATIC FUNCTION PANEL - Abnormal; Notable for the following:    Total Protein 8.5 (*)    All other components within normal limits  TROPONIN I  LIPASE, BLOOD    Imaging Review Dg Chest 2 View  08/30/2013   CLINICAL DATA:  Chest pain since yesterday.  EXAM: CHEST  2 VIEW  COMPARISON:  09/21/2012  FINDINGS: The heart size and mediastinal contours are within normal limits. Both lungs are clear. The visualized skeletal structures are unremarkable.  IMPRESSION: No active cardiopulmonary disease.   Electronically Signed   By: Richarda Overlie M.D.   On: 08/30/2013 20:12     EKG Interpretation   Date/Time:  Monday August 30 2013 19:14:59 EDT Ventricular Rate:  108 PR Interval:  154 QRS Duration: 92 QT Interval:  318 QTC Calculation: 426 R Axis:   77 Text Interpretation:  Sinus tachycardia Possible Left atrial enlargement  Borderline  ECG When compared with ECG of 21-Sep-2012 14:06, No significant  change was found Confirmed by COOK  MD, BRIAN ([id number]) on 08/30/2013 7:21:48  PM      MDM   Final diagnoses:  Abdominal pain  Peripheral neuropathy    Pt was also seen by Dr. Bebe Shaggy during this visit.  He was encouraged to f/u with his surgeon for further evaluation of his abdominal pain if sx persist.  Also discussed recheck if peripheral sx progress in any way. May need neuro eval. - referral given to Dr. Gerilyn Pilgrim prn.  The patient appears reasonably screened and/or stabilized for discharge and I doubt any other medical condition or other [facility] requiring further screening, evaluation, or treatment in the ED at this time prior to discharge.     Burgess Amor, PA-C 09/01/13 1126

## 2013-08-31 NOTE — Discharge Instructions (Signed)
Abdominal Pain, Adult Many things can cause abdominal pain. Usually, abdominal pain is not caused by a disease and will improve without treatment. It can often be observed and treated at home. Your health care provider will do a physical exam and possibly order blood tests and X-rays to help determine the seriousness of your pain. However, in many cases, more time must pass before a clear cause of the pain can be found. Before that point, your health care provider may not know if you need more testing or further treatment. HOME CARE INSTRUCTIONS  Monitor your abdominal pain for any changes. The following actions may help to alleviate any discomfort you are experiencing:  Only take over-the-counter or prescription medicines as directed by your health care provider.  Do not take laxatives unless directed to do so by your health care provider.  Try a clear liquid diet (broth, tea, or water) as directed by your health care provider. Slowly move to a bland diet as tolerated. SEEK MEDICAL CARE IF:  You have unexplained abdominal pain.  You have abdominal pain associated with nausea or diarrhea.  You have pain when you urinate or have a bowel movement.  You experience abdominal pain that wakes you in the night.  You have abdominal pain that is worsened or improved by eating food.  You have abdominal pain that is worsened with eating fatty foods. SEEK IMMEDIATE MEDICAL CARE IF:   Your pain does not go away within 2 hours.  You have a fever.  You keep throwing up (vomiting).  Your pain is felt only in portions of the abdomen, such as the right side or the left lower portion of the abdomen.  You pass bloody or black tarry stools. MAKE SURE YOU:  Understand these instructions.   Will watch your condition.   Will get help right away if you are not doing well or get worse.  Document Released: 01/30/2005 Document Revised: 02/10/2013 Document Reviewed: 12/30/2012 [facility] Patient  Information 2014 [place], [place].  Peripheral Neuropathy Peripheral neuropathy is a type of nerve damage. It affects nerves that carry signals between the spinal cord and other parts of the body. These are called peripheral nerves. With peripheral neuropathy, one nerve or a group of nerves may be damaged.  CAUSES  Many things can damage peripheral nerves. For some people with peripheral neuropathy, the cause is unknown. Some causes include:  Diabetes. This is the most common cause of peripheral neuropathy.  Injury to a nerve.  Pressure or stress on a nerve that lasts a long time.  Too little vitamin B. Alcoholism can lead to this.  Infections.  Autoimmune diseases, such as multiple sclerosis and systemic lupus erythematosus.  Inherited nerve diseases.  Some medicines, such as cancer drugs.  Toxic substances, such as lead and mercury.  Too little blood flowing to the legs.  Kidney disease.  Thyroid disease. SIGNS AND SYMPTOMS  Different people have different symptoms. The symptoms you have will depend on which of your nerves is damaged. Common symptoms include:  Loss of feeling (numbness) in the feet and hands.  Tingling in the feet and hands.  Pain that burns.  Very sensitive skin.  Weakness.  Not being able to move a part of the body (paralysis).  Muscle twitching.  Clumsiness or poor coordination.  Loss of balance.  Not being able to control your bladder.  Feeling dizzy.  Sexual problems. DIAGNOSIS  Peripheral neuropathy is a symptom, not a disease. Finding the cause of peripheral neuropathy can  be hard. To figure that out, your health care provider will take a medical history and do a physical exam. A neurological exam will also be done. This involves checking things affected by your brain, spinal cord, and nerves (nervous system). For example, your health care provider will check your reflexes, how you move, and what you can feel.  Other types of tests  may also be ordered, such as:  Blood tests.  A test of the fluid in your spinal cord.  Imaging tests, such as CT scans or an MRI.  Electromyography (EMG). This test checks the nerves that control muscles.  Nerve conduction velocity tests. These tests check how fast messages pass through your nerves.  Nerve biopsy. A small piece of nerve is removed. It is then checked under a microscope. TREATMENT   Medicine is often used to treat peripheral neuropathy. Medicines may include:  Pain-relieving medicines. Prescription or over-the-counter medicine may be suggested.  Antiseizure medicine. This may be used for pain.  Antidepressants. These also may help ease pain from neuropathy.  Lidocaine. This is a numbing medicine. You might wear a patch or be given a shot.  Mexiletine. This medicine is typically used to help control irregular heart rhythms.  Surgery. Surgery may be needed to relieve pressure on a nerve or to destroy a nerve that is causing pain.  Physical therapy to help movement.  Assistive devices to help movement. HOME CARE INSTRUCTIONS   Only take over-the-counter or prescription medicines as directed by your health care provider. Follow the instructions carefully for any given medicines. Do not take any other medicines without first getting approval from your health care provider.  If you have diabetes, work closely with your health care provider to keep your blood sugar under control.  If you have numbness in your feet:  Check every day for signs of injury or infection. Watch for redness, warmth, and swelling.  Wear padded socks and comfortable shoes. These help protect your feet.  Do not do things that put pressure on your damaged nerve.  Do not smoke. Smoking keeps blood from getting to damaged nerves.  Avoid or limit alcohol. Too much alcohol can cause a lack of B vitamins. These vitamins are needed for healthy nerves.  Develop a good support system. Coping  with peripheral neuropathy can be stressful. Talk to a mental health specialist or join a support group if you are struggling.  Follow up with your health care provider as directed. SEEK MEDICAL CARE IF:   You have new signs or symptoms of peripheral neuropathy.  You are struggling emotionally from dealing with peripheral neuropathy.  You have a fever. SEEK IMMEDIATE MEDICAL CARE IF:   You have an injury or infection that is not healing.  You feel very dizzy or begin vomiting.  You have chest pain.  You have trouble breathing. Document Released: 04/12/2002 Document Revised: 01/02/2011 Document Reviewed: 12/28/2012 [facility] Patient Information 2014 [place], [place].

## 2013-09-02 NOTE — ED Provider Notes (Signed)
Medical screening examination/treatment/procedure(s) were conducted as a shared visit with non-physician practitioner(s) and myself.  I personally evaluated the patient during the encounter.   EKG Interpretation   Date/Time:  Monday August 30 2013 19:14:59 EDT Ventricular Rate:  108 PR Interval:  154 QRS Duration: 92 QT Interval:  318 QTC Calculation: 426 R Axis:   77 Text Interpretation:  Sinus tachycardia Possible Left atrial enlargement  Borderline ECG When compared with ECG of 21-Sep-2012 14:06, No significant  change was found Confirmed by Adriana SimasOOK  MD, BRIAN ([id number]) on 08/30/2013 7:21:48  PM        Joya Gaskinsonald W Danaya Geddis, MD 09/02/13 [phone]

## 2013-12-02 ENCOUNTER — Encounter (HOSPITAL_COMMUNITY): Payer: Self-pay | Admitting: Emergency Medicine

## 2013-12-02 ENCOUNTER — Emergency Department (HOSPITAL_COMMUNITY)
Admission: EM | Admit: 2013-12-02 | Discharge: 2013-12-03 | Disposition: A | Discharge status: Home / Self Care | Payer: Medicaid Other | Attending: Emergency Medicine | Admitting: Emergency Medicine

## 2013-12-02 DIAGNOSIS — F3289 Other specified depressive episodes: Secondary | ICD-10-CM | POA: Insufficient documentation

## 2013-12-02 DIAGNOSIS — Z791 Long term (current) use of non-steroidal anti-inflammatories (NSAID): Secondary | ICD-10-CM | POA: Diagnosis not present

## 2013-12-02 DIAGNOSIS — R1013 Epigastric pain: Secondary | ICD-10-CM | POA: Insufficient documentation

## 2013-12-02 DIAGNOSIS — F329 Major depressive disorder, single episode, unspecified: Secondary | ICD-10-CM | POA: Diagnosis not present

## 2013-12-02 DIAGNOSIS — Z79899 Other long term (current) drug therapy: Secondary | ICD-10-CM | POA: Diagnosis not present

## 2013-12-02 DIAGNOSIS — G4730 Sleep apnea, unspecified: Secondary | ICD-10-CM | POA: Diagnosis not present

## 2013-12-02 DIAGNOSIS — K439 Ventral hernia without obstruction or gangrene: Secondary | ICD-10-CM | POA: Diagnosis not present

## 2013-12-02 DIAGNOSIS — Z9981 Dependence on supplemental oxygen: Secondary | ICD-10-CM | POA: Diagnosis not present

## 2013-12-02 DIAGNOSIS — F411 Generalized anxiety disorder: Secondary | ICD-10-CM | POA: Diagnosis not present

## 2013-12-02 DIAGNOSIS — I10 Essential (primary) hypertension: Secondary | ICD-10-CM | POA: Diagnosis not present

## 2013-12-02 DIAGNOSIS — Z9089 Acquired absence of other organs: Secondary | ICD-10-CM | POA: Insufficient documentation

## 2013-12-02 DIAGNOSIS — Z9889 Other specified postprocedural states: Secondary | ICD-10-CM | POA: Diagnosis not present

## 2013-12-02 DIAGNOSIS — G8929 Other chronic pain: Secondary | ICD-10-CM | POA: Insufficient documentation

## 2013-12-02 DIAGNOSIS — K219 Gastro-esophageal reflux disease without esophagitis: Secondary | ICD-10-CM | POA: Insufficient documentation

## 2013-12-02 DIAGNOSIS — F17200 Nicotine dependence, unspecified, uncomplicated: Secondary | ICD-10-CM | POA: Insufficient documentation

## 2013-12-02 LAB — URINALYSIS, ROUTINE W REFLEX MICROSCOPIC
BILIRUBIN URINE: NEGATIVE
GLUCOSE, UA: NEGATIVE mg/dL
Leukocytes, UA: NEGATIVE
Nitrite: NEGATIVE
PROTEIN: NEGATIVE mg/dL
Specific Gravity, Urine: 1.015 (ref 1.005–1.030)
Urobilinogen, UA: 0.2 mg/dL (ref 0.0–1.0)
pH: 6 (ref 5.0–8.0)

## 2013-12-02 LAB — URINE MICROSCOPIC-ADD ON

## 2013-12-02 MED ORDER — METOCLOPRAMIDE HCL 10 MG PO TABS
10.0000 mg | ORAL_TABLET | Freq: Once | ORAL | Status: AC
  Administered 2013-12-02: 10 mg via ORAL
  Filled 2013-12-02: qty 1

## 2013-12-02 MED ORDER — OXYCODONE-ACETAMINOPHEN 5-325 MG PO TABS
1.0000 | ORAL_TABLET | Freq: Once | ORAL | Status: AC
  Administered 2013-12-03: 1 via ORAL
  Filled 2013-12-02: qty 1

## 2013-12-02 NOTE — ED Notes (Signed)
abd pain "knots in my stomach", nausea,  No diarreha.  No fever.

## 2013-12-03 MED ORDER — PROMETHAZINE HCL 25 MG PO TABS: 25.0000 mg | ORAL_TABLET | Freq: Four times a day (QID) | ORAL | Status: DC | PRN

## 2013-12-03 MED ORDER — OXYCODONE-ACETAMINOPHEN 5-325 MG PO TABS: 1.0000 | ORAL_TABLET | ORAL | Status: DC | PRN

## 2013-12-03 NOTE — Discharge Instructions (Signed)

## 2013-12-03 NOTE — ED Provider Notes (Signed)
Medical screening examination/treatment/procedure(s) were conducted as a shared visit with non-physician practitioner(s) and myself.  I personally evaluated the patient during the encounter.   EKG Interpretation None        Joya Gaskins, MD 12/03/13 [phone]

## 2013-12-03 NOTE — ED Provider Notes (Signed)
CSN: [id number]     Arrival date & time 12/02/13  2118 History   First MD Initiated Contact with Patient 12/02/13 2222     Chief Complaint  Patient presents with  . Abdominal Pain     (Consider location/radiation/quality/duration/timing/severity/associated sxs/prior Treatment) Patient is a 33 y.o. male presenting with abdominal pain. The history is provided by the patient. No language interpreter was used.  Abdominal Pain Pain location:  Epigastric Pain quality: sharp   Pain radiates to:  Epigastric region Pain severity:  Moderate Associated symptoms: nausea and vomiting   Associated symptoms: no chest pain, no chills, no diarrhea, no fever and no shortness of breath   Associated symptoms comment:  He presents for evaluation of recurrent epigastric abdominal pain. He has a history significant for GERD as well as abdominal wall hernias requiring repair. No fever. He has had nausea with vomiting but denies hematemesis. No blood with normal bowel movements. No chest pain. He reports his epigastric pain is worse with eating.    Past Medical History  Diagnosis Date  . GERD (gastroesophageal reflux disease)     Bravo study Nov 2009 on Prilosec 40 BID with adequate acid suppression  . Hypertension   . Anxiety   . Depression     increased over past several months  . Chronic abdominal pain     HIDA scan Sept 2010, EF 93%, s/p chole oct 2010; [facility]; Imipramine added May 2011  . Chronic vomiting     normal GES 2009  . S/P endoscopy     2008 Dr. Karilyn Cotaehman: erosive reflux esophagitis, antral gastritis, H.pylori serologies neg, Nov 2009 Dr. Darrick PennaFields: gastritis, benign esophageal polyp, no H.pylori,  Feb 2011: [facility], Dr. Bubba HalesBurggen: normal esophagus, normal stomach, normal duodenum, path unremarkable  . Ventral hernia   . Shortness of breath     SOB even with no exertion  . Sleep apnea     supposed to sleep with CPAP  . Hiatal hernia   . Bipolar 2 disorder     Past Surgical History  Procedure Laterality Date  . Ventral hernia repair  March 2012    Dr. Lovell SheehanJenkins  . Knee surgery  2009  . Cholecystectomy  10/10  . Appendectomy  5/10  . Esophagogastroduodenoscopy  05/2006    H.pylori neg  . Inguinal hernia repair      child  . Esophagogastroduodenoscopy  03/23/2008    Fields-gastritis benign esophageal polyp, otherwise normal. Negative H. pylori he (propofol)  . Incisional hernia repair  03/04/2012    Procedure: LAPAROSCOPIC INCISIONAL HERNIA;  Surgeon: Dalia HeadingMark A Jenkins, MD;  Location: [facility];  Service: General;  Laterality: N/A;  repair, recurrent  . Insertion of mesh  03/04/2012    Procedure: INSERTION OF MESH;  Surgeon: Dalia HeadingMark A Jenkins, MD;  Location: [facility];  Service: General;  Laterality: N/A;   Family History  Problem Relation Age of Onset  . Colon cancer Neg Hx   . Diabetes Mother    History  Substance Use Topics  . Smoking status: Current Every Day Smoker -- 1.00 packs/day for 15 years    Types: Cigarettes  . Smokeless tobacco: Never Used     Comment: Less than 1/2 pk a day. trying to quit.  03/04/12 1255 states "was doing good, but now up to about a pack per day"  . Alcohol Use: No    Review of Systems  Constitutional: Negative for fever and chills.  Respiratory: Negative.  Negative for  shortness of breath.   Cardiovascular: Negative.  Negative for chest pain.  Gastrointestinal: Positive for nausea, vomiting and abdominal pain. Negative for diarrhea and blood in stool.  Musculoskeletal: Negative.   Skin: Negative.   Neurological: Negative.       Allergies  Dexilant; Propoxyphene n-acetaminophen; Zofran; and Zofran odt  Home Medications   Prior to Admission medications   Medication Sig Start Date End Date Taking? Authorizing Provider  ALPRAZolam Prudy Feeler) 1 MG tablet Take 1 mg by mouth 4 (four) times daily.    Yes Historical Provider, MD  HYDROcodone-acetaminophen (NORCO/VICODIN) 5-325 MG per tablet Take 1 tablet by  mouth every 6 (six) hours as needed for moderate pain.   Yes Historical Provider, MD  ibuprofen (ADVIL,MOTRIN) 800 MG tablet Take 800 mg by mouth every 8 (eight) hours as needed for mild pain or moderate pain.   Yes Historical Provider, MD  lisinopril-hydrochlorothiazide (PRINZIDE,ZESTORETIC) 20-25 MG per tablet Take 1 tablet by mouth daily. 06/23/13  Yes Kela Millin, MD  omeprazole (PRILOSEC) 40 MG capsule Take 40 mg by mouth 2 (two) times daily.   Yes Historical Provider, MD   BP 154/98  Pulse 84  Temp(Src) 98.9 F (37.2 C) (Oral)  Resp 20  Ht 5\' 9"  (1.753 m)  Wt 280 lb (127.007 kg)  BMI 41.33 kg/m2  SpO2 98% Physical Exam  Constitutional: He is oriented to person, place, and time. He appears well-developed and well-nourished.  HENT:  Head: Normocephalic.  Neck: Normal range of motion. Neck supple.  Cardiovascular: Normal rate and regular rhythm.   Pulmonary/Chest: Effort normal and breath sounds normal.  Abdominal: Soft. Bowel sounds are normal. There is tenderness. There is no rebound and no guarding.  Epigastric tenderness without mass, rebound or guarding.   Musculoskeletal: Normal range of motion.  Neurological: He is alert and oriented to person, place, and time.  Skin: Skin is warm and dry. No rash noted.  Psychiatric: He has a normal mood and affect.    ED Course  Procedures (including critical care time) Labs Review Labs Reviewed  URINALYSIS, ROUTINE W REFLEX MICROSCOPIC - Abnormal; Notable for the following:    Hgb urine dipstick SMALL (*)    Ketones, ur TRACE (*)    All other components within normal limits  URINE MICROSCOPIC-ADD ON    Imaging Review No results found.   EKG Interpretation None      MDM   Final diagnoses:  None    1. Abdominal wall hernia 2. GERD  Reglan given for nausea, Percocet for pain. The patient is examined by Dr. Bebe Shaggy and is felt stable for discharge with referral to surgery for elective ventral hernia repair.      Arnoldo Hooker, PA-C 12/03/13 0015

## 2013-12-03 NOTE — ED Provider Notes (Signed)
Patient seen/examined in the Emergency Department in conjunction with Midlevel Provider Upstill Patient reports abd pain.  Exam : awake/alert, watching TV, no distress, mild epigastric tenderness.  He has reducible midline hernia when he stands Plan: advised f/u outpatient with general surgery Discussed strict return precautions   Joya Gaskinsonald W Dniyah Grant, MD 12/03/13 [phone]

## 2014-01-06 ENCOUNTER — Encounter (HOSPITAL_COMMUNITY): Payer: Self-pay | Admitting: Pharmacy Technician

## 2014-01-06 NOTE — H&P (Signed)
NTS SOAP Note  Vital Signs:  Vitals as of: 01/06/2014: Systolic 159: Diastolic 96: Heart Rate 74: Temp 97.49F: Height 43ft 9in: Weight 277Lbs 0 Ounces: Pain Level 7: BMI 40.91  BMI : 40.91 kg/m2  Subjective: This 33 year old male presents for of epigastric pain and swelling just below epigastric trochar site.  Made worse with movement.  Has been present for many months now.  Review of Symptoms:  Constitutional:unremarkable   Head:unremarkable Eyes:unremarkable   Nose/Mouth/Throat:unremarkable Cardiovascular:  unremarkable Respiratory:unremarkable Gastrointestinabdominal pain, nausea, heartburn Genitourinary:unremarkable   Musculoskeletal:unremarkable Skin:unremarkable Hematolgic/Lymphatic:unremarkable   Allergic/Immunologic:unremarkable   Past Medical History:  Reviewed  Past Medical History  Surgical History: incisional herniorrhaphy with mesh 2012,  lap cholecystectomy,  inguinal herniorrhaphy as a child,  appendectomy Medical Problems: HTN,  reflux Allergies: nkda Medications: lisinopril,  xanax,  omeprazole    Social History:Reviewed  Social History  Preferred Language: English ([place]) Race:  White Ethnicity: Not Hispanic / Latino Age: 33 Years 9 Months Marital Status:  S Alcohol: ?   Smoking Status: Current every day smoker reviewed on 01/06/2014 Started Date:  Packs per day: 2.00 Functional Status reviewed on 01/06/2014 ------------------------------------------------ Bathing: Normal Cooking: Normal Dressing: Normal Driving: Normal Eating: Normal Managing Meds: Normal Oral Care: Normal Shopping: Normal Toileting: Normal Transferring: Normal Walking: Normal Cognitive Status reviewed on 01/06/2014 ------------------------------------------------ Attention: Normal Decision Making: Normal Language: Normal Memory: Normal Motor: Normal Perception: Normal Problem Solving: Normal Visual and Spatial:  Normal   Family History:Reviewed  Family Health History Mother, Healthy;  Father, Living; Healthy; healthy    Objective Information: Neck:Supple without lymphadenopathy.  Heart:RRR, no murmur or gallop.  Normal S1, S2.  No S3, S4.  Lungs:  CTA bilaterally, no wheezes, rhonchi, rales.  Breathing unlabored. Abdomen:Soft, NT/ND, no HSM, no masses.  Small reducible hernia just below epigastric incision site.  Assessment:Incisional hernia  Diagnoses: 553.21  K43.2 Incisional hernia (Incisional hernia without obstruction or gangrene)  Procedures: 09983 - OFFICE OUTPATIENT VISIT 25 MINUTES    Plan:  Scheduled for incisional herniorrhaphy with mesh on 01/12/14.   Patient Education:Alternative treatments to surgery were discussed with patient (and family).  Risks and benefits  of procedure including bleeding,  infection,  and recurrence of the hernia were fully explained to the patient (and family) who gave informed consent. Patient/family questions were addressed.  Follow-up:Pending Surgery

## 2014-01-07 ENCOUNTER — Encounter (HOSPITAL_COMMUNITY)
Admission: RE | Admit: 2014-01-07 | Discharge: 2014-01-07 | Disposition: A | Discharge status: Home / Self Care | Payer: Medicaid Other | Source: Ambulatory Visit | Attending: General Surgery | Admitting: General Surgery

## 2014-01-07 ENCOUNTER — Encounter (HOSPITAL_COMMUNITY): Payer: Self-pay

## 2014-01-07 DIAGNOSIS — Z01818 Encounter for other preprocedural examination: Secondary | ICD-10-CM | POA: Insufficient documentation

## 2014-01-07 DIAGNOSIS — K432 Incisional hernia without obstruction or gangrene: Secondary | ICD-10-CM | POA: Diagnosis not present

## 2014-01-07 LAB — BASIC METABOLIC PANEL
Anion gap: 12 (ref 5–15)
BUN: 13 mg/dL (ref 6–23)
CHLORIDE: 103 meq/L (ref 96–112)
CO2: 25 meq/L (ref 19–32)
Calcium: 9.2 mg/dL (ref 8.4–10.5)
Creatinine, Ser: 0.96 mg/dL (ref 0.50–1.35)
GFR calc non Af Amer: >90 mL/min (ref >90)
Glucose, Bld: 106 mg/dL — ABNORMAL HIGH (ref 70–99)
POTASSIUM: 4 meq/L (ref 3.7–5.3)
Sodium: 140 mEq/L (ref 137–147)

## 2014-01-07 LAB — CBC WITH DIFFERENTIAL/PLATELET
Basophils Absolute: 0 10*3/uL (ref 0.0–0.1)
Basophils Relative: 0 % (ref 0–1)
Eosinophils Absolute: 0.3 10*3/uL (ref 0.0–0.7)
Eosinophils Relative: 4 % (ref 0–5)
HEMATOCRIT: 44.3 % (ref 39.0–52.0)
HEMOGLOBIN: 15.6 g/dL (ref 13.0–17.0)
LYMPHS ABS: 2.6 10*3/uL (ref 0.7–4.0)
Lymphocytes Relative: 33 % (ref 12–46)
MCH: 32 pg (ref 26.0–34.0)
MCHC: 35.2 g/dL (ref 30.0–36.0)
MCV: 90.8 fL (ref 78.0–100.0)
Monocytes Absolute: 0.6 10*3/uL (ref 0.1–1.0)
Monocytes Relative: 8 % (ref 3–12)
NEUTROS PCT: 55 % (ref 43–77)
Neutro Abs: 4.3 10*3/uL (ref 1.7–7.7)
Platelets: 231 10*3/uL (ref 150–400)
RBC: 4.88 MIL/uL (ref 4.22–5.81)
RDW: 12.9 % (ref 11.5–15.5)
WBC: 7.8 10*3/uL (ref 4.0–10.5)

## 2014-01-07 MED ORDER — CHLORHEXIDINE GLUCONATE 4 % EX LIQD: 1.0000 "application " | Freq: Once | CUTANEOUS | Status: DC

## 2014-01-07 NOTE — Patient Instructions (Signed)
[name]  01/07/2014   Your procedure is scheduled on:  Wednesday, 01/12/14  Report to [name] at 0940 AM.  Call this number if you have problems the morning of surgery: [phone]   Remember:   Do not eat food or drink liquids after midnight.   Take these medicines the morning of surgery with A SIP OF WATER: Xanax, vicodin (if needed), zestoretic, omeprazole, fetzima   Do not wear jewelry, make-up or nail polish.  Do not wear lotions, powders, or perfumes. You may wear deodorant.  Do not shave 48 hours prior to surgery. Men may shave face and neck.  Do not bring valuables to the hospital.  [facility] is not responsible                  for any belongings or valuables.               Contacts, dentures or bridgework may not be worn into surgery.  Leave suitcase in the car. After surgery it may be brought to your room.  For patients admitted to the hospital, discharge time is determined by your                treatment team.               Patients discharged the day of surgery will not be allowed to drive  home.  Name and phone number of your driver: Father, Ed  Special Instructions: Shower using CHG 2 nights before surgery and the night before surgery.  If you shower the day of surgery use CHG.  Use special wash - you have one bottle of CHG for all showers.  You should use approximately 1/3 of the bottle for each shower.   Please read over the following fact sheets that you were given: Pain Booklet, Coughing and Deep Breathing, Surgical Site Infection Prevention and Anesthesia Post-op Instructions   Hernia Repair Care After These instructions give you information on caring for yourself after your procedure. Your doctor may also give you more specific instructions. Call your doctor if you have any problems or questions after your procedure. HOME CARE   You may have changes in your poops (bowel movements).  You may have loose or watery poop (diarrhea).  You may be not able  to poop.  Your bowels will slowly get back to normal.  Do not eat any food that makes you sick to your stomach (nauseous). Eat small meals 4 to 6 times a day instead of 3 large ones.  Do not drink pop. It will give you gas.  Do not drink alcohol.  Do not lift anything heavier than 10 pounds. This is about the weight of a gallon of milk.  Do not do anything that makes you very tired for at least 6 weeks.  Do not get your wound wet for 2 days.  You may take a sponge bath during this time.  After 2 days you may take a shower. Gently pat your surgical cut (incision) dry with a towel. Do not rub it.  For men: You may have been given an athletic supporter (scrotal support) before you left the hospital. It holds your scrotum and testicles closer to your body so there is no strain on your wound. Wear the supporter until your doctor tells you that you do not need it anymore. GET HELP RIGHT AWAY IF:  You have watery poop, or cannot poop for more than 3 days.  You  feel sick to your stomach or throw up (vomit) more than 2 or 3 times.  You have temperature by mouth above 102 F (38.9 C).  You see redness or puffiness (swelling) around your wound.  You see yellowish white fluid (pus) coming from your wound.  You see a bulge or bump in your lower belly (abdomen) or near your groin.  You develop a rash, trouble breathing, or any other symptoms from medicines taken. MAKE SURE YOU:  Understand these instructions.  Will watch your condition.  Will get help right away if your are not doing well or get worse. Document Released: 04/04/2008 Document Revised: 07/15/2011 Document Reviewed: 04/04/2008 [facility] Patient Information 2015 [place], [place]. This information is not intended to replace advice given to you by your health care provider. Make sure you discuss any questions you have with your health care provider.   General Anesthesia, Care After Refer to this sheet in the next few weeks.  These instructions provide you with information on caring for yourself after your procedure. Your health care provider may also give you more specific instructions. Your treatment has been planned according to current medical practices, but problems sometimes occur. Call your health care provider if you have any problems or questions after your procedure. WHAT TO EXPECT AFTER THE PROCEDURE After the procedure, it is typical to experience:  Sleepiness.  Nausea and vomiting. HOME CARE INSTRUCTIONS  For the first 24 hours after general anesthesia:  Have a responsible person with you.  Do not drive a car. If you are alone, do not take public transportation.  Do not drink alcohol.  Do not take medicine that has not been prescribed by your health care provider.  Do not sign important papers or make important decisions.  You may resume a normal diet and activities as directed by your health care provider.  Change bandages (dressings) as directed.  If you have questions or problems that seem related to general anesthesia, call the hospital and ask for the anesthetist or anesthesiologist on call. SEEK MEDICAL CARE IF:  You have nausea and vomiting that continue the day after anesthesia.  You develop a rash. SEEK IMMEDIATE MEDICAL CARE IF:   You have difficulty breathing.  You have chest pain.  You have any allergic problems. Document Released: 07/29/2000 Document Revised: 04/27/2013 Document Reviewed: 11/05/2012 [facility] Patient Information 2015 [place], [place]. This information is not intended to replace advice given to you by your health care provider. Make sure you discuss any questions you have with your health care provider.

## 2014-01-12 ENCOUNTER — Encounter (HOSPITAL_COMMUNITY): Payer: Self-pay | Admitting: *Deleted

## 2014-01-12 ENCOUNTER — Ambulatory Visit (HOSPITAL_COMMUNITY)
Admission: RE | Admit: 2014-01-12 | Discharge: 2014-01-12 | Disposition: A | Discharge status: Home / Self Care | Payer: Medicaid Other | Source: Ambulatory Visit | Attending: General Surgery | Admitting: General Surgery

## 2014-01-12 ENCOUNTER — Ambulatory Visit (HOSPITAL_COMMUNITY): Payer: Medicaid Other | Admitting: Anesthesiology

## 2014-01-12 ENCOUNTER — Encounter (HOSPITAL_COMMUNITY)
Admission: RE | Disposition: A | Discharge status: Home / Self Care | Payer: Self-pay | Source: Ambulatory Visit | Attending: General Surgery

## 2014-01-12 ENCOUNTER — Encounter (HOSPITAL_COMMUNITY): Payer: Medicaid Other | Admitting: Anesthesiology

## 2014-01-12 DIAGNOSIS — F341 Dysthymic disorder: Secondary | ICD-10-CM | POA: Diagnosis not present

## 2014-01-12 DIAGNOSIS — J449 Chronic obstructive pulmonary disease, unspecified: Secondary | ICD-10-CM | POA: Insufficient documentation

## 2014-01-12 DIAGNOSIS — I10 Essential (primary) hypertension: Secondary | ICD-10-CM | POA: Diagnosis not present

## 2014-01-12 DIAGNOSIS — Z79899 Other long term (current) drug therapy: Secondary | ICD-10-CM | POA: Insufficient documentation

## 2014-01-12 DIAGNOSIS — K432 Incisional hernia without obstruction or gangrene: Secondary | ICD-10-CM | POA: Insufficient documentation

## 2014-01-12 DIAGNOSIS — K219 Gastro-esophageal reflux disease without esophagitis: Secondary | ICD-10-CM | POA: Diagnosis not present

## 2014-01-12 DIAGNOSIS — F17200 Nicotine dependence, unspecified, uncomplicated: Secondary | ICD-10-CM | POA: Diagnosis not present

## 2014-01-12 DIAGNOSIS — J4489 Other specified chronic obstructive pulmonary disease: Secondary | ICD-10-CM | POA: Insufficient documentation

## 2014-01-12 HISTORY — PX: INCISIONAL HERNIA REPAIR: SHX193

## 2014-01-12 HISTORY — PX: INSERTION OF MESH: SHX5868

## 2014-01-12 SURGERY — REPAIR, HERNIA, INCISIONAL
Anesthesia: General | Site: Abdomen

## 2014-01-12 MED ORDER — FENTANYL CITRATE 0.05 MG/ML IJ SOLN
INTRAMUSCULAR | Status: AC
  Filled 2014-01-12: qty 2

## 2014-01-12 MED ORDER — ENOXAPARIN SODIUM 40 MG/0.4ML ~~LOC~~ SOLN
40.0000 mg | Freq: Once | SUBCUTANEOUS | Status: AC
  Administered 2014-01-12: 40 mg via SUBCUTANEOUS
  Filled 2014-01-12: qty 0.4

## 2014-01-12 MED ORDER — BUPIVACAINE HCL (PF) 0.5 % IJ SOLN
INTRAMUSCULAR | Status: AC
  Filled 2014-01-12: qty 30

## 2014-01-12 MED ORDER — KETOROLAC TROMETHAMINE 30 MG/ML IJ SOLN
INTRAMUSCULAR | Status: AC
  Filled 2014-01-12: qty 1

## 2014-01-12 MED ORDER — MIDAZOLAM HCL 2 MG/2ML IJ SOLN
INTRAMUSCULAR | Status: AC
  Filled 2014-01-12: qty 2

## 2014-01-12 MED ORDER — ONDANSETRON HCL 4 MG/2ML IJ SOLN: INTRAMUSCULAR | Status: DC | PRN

## 2014-01-12 MED ORDER — NEOSTIGMINE METHYLSULFATE 10 MG/10ML IV SOLN
INTRAVENOUS | Status: DC | PRN
  Administered 2014-01-12: 2.5 mg via INTRAVENOUS

## 2014-01-12 MED ORDER — ONDANSETRON HCL 4 MG/2ML IJ SOLN
INTRAMUSCULAR | Status: DC | PRN
  Administered 2014-01-12: 4 mg via INTRAVENOUS

## 2014-01-12 MED ORDER — ROCURONIUM BROMIDE 50 MG/5ML IV SOLN
INTRAVENOUS | Status: AC
  Filled 2014-01-12: qty 1

## 2014-01-12 MED ORDER — LACTATED RINGERS IV SOLN
INTRAVENOUS | Status: DC
Start: 2014-01-12 — End: 2014-01-12
  Administered 2014-01-12: 1000 mL via INTRAVENOUS

## 2014-01-12 MED ORDER — GLYCOPYRROLATE 0.2 MG/ML IJ SOLN
0.2000 mg | Freq: Once | INTRAMUSCULAR | Status: AC
  Administered 2014-01-12: 0.2 mg via INTRAVENOUS

## 2014-01-12 MED ORDER — CEFAZOLIN SODIUM-DEXTROSE 2-3 GM-% IV SOLR
2.0000 g | INTRAVENOUS | Status: AC
Start: 2014-01-12 — End: 2014-01-12
  Administered 2014-01-12: 2 g via INTRAVENOUS
  Filled 2014-01-12: qty 50

## 2014-01-12 MED ORDER — POVIDONE-IODINE 10 % OINT PACKET
TOPICAL_OINTMENT | CUTANEOUS | Status: DC | PRN
  Administered 2014-01-12: 1 via TOPICAL

## 2014-01-12 MED ORDER — ONDANSETRON HCL 4 MG/2ML IJ SOLN
INTRAMUSCULAR | Status: AC
  Filled 2014-01-12: qty 2

## 2014-01-12 MED ORDER — OXYCODONE-ACETAMINOPHEN 7.5-325 MG PO TABS: 1.0000 | ORAL_TABLET | ORAL | Status: DC | PRN

## 2014-01-12 MED ORDER — DEXAMETHASONE SODIUM PHOSPHATE 4 MG/ML IJ SOLN
INTRAMUSCULAR | Status: AC
  Filled 2014-01-12: qty 1

## 2014-01-12 MED ORDER — DEXTROSE 5 % IV SOLN: 3.0000 g | INTRAVENOUS | Status: DC

## 2014-01-12 MED ORDER — PROPOFOL 10 MG/ML IV EMUL
INTRAVENOUS | Status: AC
  Filled 2014-01-12: qty 20

## 2014-01-12 MED ORDER — GLYCOPYRROLATE 0.2 MG/ML IJ SOLN
INTRAMUSCULAR | Status: AC
  Filled 2014-01-12: qty 2

## 2014-01-12 MED ORDER — DEXAMETHASONE SODIUM PHOSPHATE 4 MG/ML IJ SOLN
4.0000 mg | Freq: Once | INTRAMUSCULAR | Status: AC
  Administered 2014-01-12: 4 mg via INTRAVENOUS

## 2014-01-12 MED ORDER — KETOROLAC TROMETHAMINE 30 MG/ML IJ SOLN
30.0000 mg | Freq: Once | INTRAMUSCULAR | Status: AC
  Administered 2014-01-12: 30 mg via INTRAVENOUS

## 2014-01-12 MED ORDER — FENTANYL CITRATE 0.05 MG/ML IJ SOLN
INTRAMUSCULAR | Status: DC | PRN
  Administered 2014-01-12: 100 ug via INTRAVENOUS
  Administered 2014-01-12 (×3): 50 ug via INTRAVENOUS

## 2014-01-12 MED ORDER — GLYCOPYRROLATE 0.2 MG/ML IJ SOLN
INTRAMUSCULAR | Status: AC
  Filled 2014-01-12: qty 1

## 2014-01-12 MED ORDER — GLYCOPYRROLATE 0.2 MG/ML IJ SOLN
INTRAMUSCULAR | Status: DC | PRN
  Administered 2014-01-12: .4 mg via INTRAVENOUS

## 2014-01-12 MED ORDER — PROPOFOL 10 MG/ML IV BOLUS
INTRAVENOUS | Status: DC | PRN
  Administered 2014-01-12: 200 mg via INTRAVENOUS

## 2014-01-12 MED ORDER — SUCCINYLCHOLINE CHLORIDE 20 MG/ML IJ SOLN
INTRAMUSCULAR | Status: AC
  Filled 2014-01-12: qty 1

## 2014-01-12 MED ORDER — BUPIVACAINE LIPOSOME 1.3 % IJ SUSP
INTRAMUSCULAR | Status: DC | PRN
  Administered 2014-01-12: 20 mL

## 2014-01-12 MED ORDER — FENTANYL CITRATE 0.05 MG/ML IJ SOLN
25.0000 ug | INTRAMUSCULAR | Status: DC | PRN
  Administered 2014-01-12 (×4): 50 ug via INTRAVENOUS
  Filled 2014-01-12: qty 2

## 2014-01-12 MED ORDER — LIDOCAINE HCL 1 % IJ SOLN
INTRAMUSCULAR | Status: DC | PRN
  Administered 2014-01-12: 50 mg via INTRADERMAL

## 2014-01-12 MED ORDER — POVIDONE-IODINE 10 % EX OINT
TOPICAL_OINTMENT | CUTANEOUS | Status: AC
  Filled 2014-01-12: qty 2

## 2014-01-12 MED ORDER — ROCURONIUM BROMIDE 100 MG/10ML IV SOLN
INTRAVENOUS | Status: DC | PRN
  Administered 2014-01-12: 8 mg via INTRAVENOUS
  Administered 2014-01-12: 15 mg via INTRAVENOUS

## 2014-01-12 MED ORDER — LIDOCAINE HCL (PF) 1 % IJ SOLN
INTRAMUSCULAR | Status: AC
  Filled 2014-01-12: qty 5

## 2014-01-12 MED ORDER — MIDAZOLAM HCL 2 MG/2ML IJ SOLN
1.0000 mg | INTRAMUSCULAR | Status: DC | PRN
  Administered 2014-01-12 (×2): 2 mg via INTRAVENOUS

## 2014-01-12 MED ORDER — CEFAZOLIN SODIUM 1-5 GM-% IV SOLN
1.0000 g | INTRAVENOUS | Status: AC
  Administered 2014-01-12: 1 g via INTRAVENOUS
  Filled 2014-01-12: qty 50

## 2014-01-12 MED ORDER — DEXAMETHASONE SODIUM PHOSPHATE 10 MG/ML IJ SOLN
INTRAMUSCULAR | Status: DC | PRN
  Administered 2014-01-12: 4 mg via INTRAVENOUS

## 2014-01-12 MED ORDER — SODIUM CHLORIDE 0.9 % IR SOLN
Status: DC | PRN
  Administered 2014-01-12: 1000 mL

## 2014-01-12 MED ORDER — BUPIVACAINE LIPOSOME 1.3 % IJ SUSP
INTRAMUSCULAR | Status: AC
  Filled 2014-01-12: qty 20

## 2014-01-12 SURGICAL SUPPLY — 45 items
BAG HAMPER (MISCELLANEOUS) ×4 IMPLANT
CLOTH BEACON ORANGE TIMEOUT ST (SAFETY) ×4 IMPLANT
COVER LIGHT HANDLE STERIS (MISCELLANEOUS) ×8 IMPLANT
DECANTER SPIKE VIAL GLASS SM (MISCELLANEOUS) IMPLANT
DURAPREP 26ML APPLICATOR (WOUND CARE) ×4 IMPLANT
ELECT REM PT RETURN 9FT ADLT (ELECTROSURGICAL) ×4
ELECTRODE REM PT RTRN 9FT ADLT (ELECTROSURGICAL) ×2 IMPLANT
FORMALIN 10 PREFIL 480ML (MISCELLANEOUS) ×2 IMPLANT
GAUZE SPONGE 4X4 12PLY STRL (GAUZE/BANDAGES/DRESSINGS) ×3 IMPLANT
GLOVE BIOGEL M 7.0 STRL (GLOVE) ×2 IMPLANT
GLOVE BIOGEL PI IND STRL 7.0 (GLOVE) IMPLANT
GLOVE BIOGEL PI IND STRL 7.5 (GLOVE) IMPLANT
GLOVE BIOGEL PI INDICATOR 7.0 (GLOVE) ×2
GLOVE BIOGEL PI INDICATOR 7.5 (GLOVE) ×2
GLOVE EXAM NITRILE MD LF STRL (GLOVE) ×2 IMPLANT
GLOVE SURG SS PI 7.5 STRL IVOR (GLOVE) ×6 IMPLANT
GOWN STRL REUS W/TWL LRG LVL3 (GOWN DISPOSABLE) ×12 IMPLANT
INST SET MAJOR GENERAL (KITS) ×4 IMPLANT
KIT ROOM TURNOVER APOR (KITS) ×4 IMPLANT
MANIFOLD NEPTUNE II (INSTRUMENTS) ×4 IMPLANT
MESH VENTRALEX ST 1-7/10 CRC S (Mesh General) ×2 IMPLANT
NDL HYPO 21X1.5 SAFETY (NEEDLE) IMPLANT
NDL HYPO 25X1 1.5 SAFETY (NEEDLE) ×2 IMPLANT
NEEDLE HYPO 21X1.5 SAFETY (NEEDLE) ×4 IMPLANT
NEEDLE HYPO 25X1 1.5 SAFETY (NEEDLE) ×4 IMPLANT
NS IRRIG 1000ML POUR BTL (IV SOLUTION) ×4 IMPLANT
PACK ABDOMINAL MAJOR (CUSTOM PROCEDURE TRAY) ×4 IMPLANT
PAD ARMBOARD 7.5X6 YLW CONV (MISCELLANEOUS) ×4 IMPLANT
SET BASIN LINEN APH (SET/KITS/TRAYS/PACK) ×4 IMPLANT
SPONGE GAUZE 4X4 12PLY (GAUZE/BANDAGES/DRESSINGS) ×2 IMPLANT
STAPLER VISISTAT (STAPLE) ×2 IMPLANT
SUT ETHIBOND NAB MO 7 #0 18IN (SUTURE) IMPLANT
SUT NOVA NAB GS-21 1 T12 (SUTURE) IMPLANT
SUT NOVA NAB GS-22 2 2-0 T-19 (SUTURE) IMPLANT
SUT NOVA NAB GS-26 0 60 (SUTURE) IMPLANT
SUT PROLENE 0 CT 1 CR/8 (SUTURE) IMPLANT
SUT SILK 2 0 (SUTURE)
SUT SILK 2-0 18XBRD TIE 12 (SUTURE) IMPLANT
SUT VIC AB 2-0 CT1 27 (SUTURE) ×4
SUT VIC AB 2-0 CT1 TAPERPNT 27 (SUTURE) ×2 IMPLANT
SUT VIC AB 3-0 SH 27 (SUTURE) ×4
SUT VIC AB 3-0 SH 27X BRD (SUTURE) ×2 IMPLANT
SUT VIC AB 4-0 PS2 27 (SUTURE) IMPLANT
SYR 20CC LL (SYRINGE) ×4 IMPLANT
TAPE CLOTH SURG 4X10 WHT LF (GAUZE/BANDAGES/DRESSINGS) ×2 IMPLANT

## 2014-01-12 NOTE — Anesthesia Preprocedure Evaluation (Addendum)
Anesthesia Evaluation  Patient identified by MRN, date of birth, ID band Patient awake    Reviewed: Allergy & Precautions, H&P , NPO status , Patient's Chart, lab work & pertinent test results  History of Anesthesia Complications Negative for: history of anesthetic complications  Airway Mallampati: I TM Distance: >3 FB     Dental  (+) Teeth Intact   Pulmonary shortness of breath and with exertion, sleep apnea , COPDCurrent Smoker,  breath sounds clear to auscultation        Cardiovascular hypertension, Pt. on medications Rhythm:Regular Rate:Normal     Neuro/Psych PSYCHIATRIC DISORDERS Anxiety Depression    GI/Hepatic GERD-  Medicated,(+)     substance abuse  alcohol use,   Endo/Other  Morbid obesity  Renal/GU      Musculoskeletal   Abdominal   Peds  Hematology   Anesthesia Other Findings   Reproductive/Obstetrics                           Anesthesia Physical Anesthesia Plan  ASA: III  Anesthesia Plan: General   Post-op Pain Management:    Induction: Intravenous, Rapid sequence and Cricoid pressure planned  Airway Management Planned: Oral ETT  Additional Equipment:   Intra-op Plan:   Post-operative Plan: Extubation in OR  Informed Consent: I have reviewed the patients History and Physical, chart, labs and discussed the procedure including the risks, benefits and alternatives for the proposed anesthesia with the patient or authorized representative who has indicated his/her understanding and acceptance.     Plan Discussed with:   Anesthesia Plan Comments:         Anesthesia Quick Evaluation

## 2014-01-12 NOTE — Transfer of Care (Signed)
Immediate Anesthesia Transfer of Care Note  Patient: [name]  Procedure(s) Performed: Procedure(s): HERNIA REPAIR INCISIONAL WITH MESH (N/A) INSERTION OF MESH (N/A)  Patient Location: PACU  Anesthesia Type:General  Level of Consciousness: awake, alert , oriented, patient cooperative and responds to stimulation  Airway & Oxygen Therapy: Patient Spontanous Breathing and Patient connected to nasal cannula oxygen  Post-op Assessment: Report given to PACU RN  Post vital signs: Reviewed and stable  Complications: No apparent anesthesia complications

## 2014-01-12 NOTE — Transfer of Care (Signed)
Immediate Anesthesia Transfer of Care Note  Patient: [name]  Procedure(s) Performed: Procedure(s): HERNIA REPAIR INCISIONAL WITH MESH (N/A) INSERTION OF MESH (N/A)  Patient Location: PACU  Anesthesia Type:General  Level of Consciousness: awake, alert , oriented, patient cooperative and responds to stimulation  Airway & Oxygen Therapy: Patient Spontanous Breathing and Patient connected to nasal cannula oxygen  Post-op Assessment: Report given to PACU RN, Post -op Vital signs reviewed and stable and Patient moving all extremities  Post vital signs: Reviewed and stable  Complications: No apparent anesthesia complications

## 2014-01-12 NOTE — Interval H&P Note (Signed)
History and Physical Interval Note:  01/12/2014 11:24 AM  [name]  has presented today for surgery, with the diagnosis of incisional hernia  The various methods of treatment have been discussed with the patient and family. After consideration of risks, benefits and other options for treatment, the patient has consented to  Procedure(s): HERNIA REPAIR INCISIONAL WITH MESH (N/A) as a surgical intervention .  The patient's history has been reviewed, patient examined, no change in status, stable for surgery.  I have reviewed the patient's chart and labs.  Questions were answered to the patient's satisfaction.     Franky Macho A

## 2014-01-12 NOTE — Op Note (Signed)
Patient:  [name]  DOB:  11-Jun-1980  MRN:  [id number]   Preop Diagnosis:  Incisional hernia  Postop Diagnosis:  Same  Procedure:  Incisional herniorrhaphy with mesh  Surgeon:  Franky Macho, M.D.  Anes:  General endotracheal  Indications:  Patient is a 33 year old white male who has had laparoscopic surgery in the past who now presents with an incisional hernia at the epigastric trocar site. The risks and benefits of the procedure including bleeding, infection, and recurrence of the hernia were fully explained to the patient, who gave informed consent.  Procedure note:  The patient is placed the supine position. After induction of general endotracheal anesthesia, the abdomen was prepped and draped using usual sterile technique with DuraPrep. Surgical site confirmation was performed.  The epigastric transverse incision was extended laterally. The subcutaneous tissue was then bluntly dissected down to the fascia. A 1 cm hernia defect was noted. The epiploic fat was then excised and removed. It was disposed of. The underside of the fascia was inspected digitally and noted to be clear. A small 4.3 cm Ventralax ST hernia patch was inserted and secured to the fascia using 0 Ethibond interrupted sutures. The overlying fascia was then reapproximated longitudinally using 0 Ethibond interrupted sutures. The subcutaneous was reapproximated using a 2-0 Vicryl interrupted suture. The skin was closed using staples. Exparel was instilled in the surrounding wound. Betadine ointment and a dry sterile dressing were applied.  All tape and needle counts were correct at the end of the procedure. Patient was extubated in the operating room and transferred to PACU in stable condition.  Complications:  None  EBL:  Minimal  Specimen:  None

## 2014-01-12 NOTE — Discharge Instructions (Signed)
Open Hernia Repair Open hernia repair is surgery to fix a hernia. A hernia occurs when an internal organ or tissue pushes out through a weak spot in the abdominal wall muscles. Hernias commonly occur in the groin and around the navel. Most hernias tend to get worse over time. Surgery is often done to prevent the hernia from getting bigger, becoming uncomfortable, or becoming an emergency. Emergency surgery may be needed if abdominal contents get stuck in the opening (incarcerated hernia) or the blood supply gets cut off (strangulated hernia). In an open repair, a large cut (incision) is made in the abdomen to perform the surgery. LET [facility] CARE PROVIDER KNOW ABOUT:  Any allergies you have.  All medicines you are taking, including vitamins, herbs, eye drops, creams, and over-the-counter medicines.  Previous problems you or members of your family have had with the use of anesthetics.  Any blood disorders you have.  Previous surgeries you have had.  Medical conditions you have. RISKS AND COMPLICATIONS Generally, this is a safe procedure. However, as with any procedure, complications can occur. Possible complications include:  Infection.  Bleeding.  Nerve injury.  Chronic pain.  The hernia can come back.  Injury to the intestines. BEFORE THE PROCEDURE  Ask your health care provider about changing or stopping any regular medicines. Avoid taking aspirin or blood thinners as directed by your health care provider.  Do noteat or drink anything after midnight the night before surgery.  If you smoke, do not smoke for at least 2 weeks before your surgery.  Do not drink alcohol the day before your surgery.  Let your health care provider know if you develop a cold or any infection before your surgery.  Arrange for someone to drive you home after the procedure or after your hospital stay. Also arrange for someone to help you with activities during recovery. PROCEDURE   Small  monitors will be put on your body. They are used to check your heart, blood pressure, and oxygen level.   An IV access tube will be put into one of your veins. Medicine will be able to flow directly into your body through this IV tube.   You might be given a medicine to help you relax (sedative).   You will be given a medicine to make you sleep (general anesthetic). A breathing tube may be placed into your lungs during the procedure.  A cut (incision) is made over the hernia defect, and the contents are pushed back into the abdomen.  If the hernia is small, stitches may be used to bring the muscle edges back together.  Typically, a surgeon will place a mesh patch made of man-made material (synthetic) to cover the defect. The mesh is sewn to healthy muscle. This reduces the risk of the hernia coming back.  The tissue and skin over the hernia are then closed with stitches or staples.  If the hernia was large, a drain may be left in place to collect excess fluid where the hernia used to be.  Bandages (dressings) are used to cover the incision. AFTER THE PROCEDURE  You will be taken to a recovery area where your progress will be monitored.  If the hernia was small or in the groin (inguinal) region, you will likely be allowed to go home once you are awake, stable, and taking fluids well.  If the hernia was large, you may have to wait for your bowel function to return. You may need to stay in the hospital  for 2-3 days until you can eat and your pain is controlled. A drain may be left in place for 5-7 days. You will be taught how to care for the drain. Document Released: 10/16/2000 Document Revised: 02/10/2013 Document Reviewed: 12/02/2012 [facility] Patient Information 2015 [place], [place]. This information is not intended to replace advice given to you by your health care provider. Make sure you discuss any questions you have with your health care provider.

## 2014-01-12 NOTE — Transfer of Care (Signed)
Immediate Anesthesia Transfer of Care Note  Patient: [name]  Procedure(s) Performed: Procedure(s): HERNIA REPAIR INCISIONAL WITH MESH (N/A) INSERTION OF MESH (N/A)  Patient Location: PACU  Anesthesia Type:General  Level of Consciousness: awake, alert , oriented and patient cooperative  Airway & Oxygen Therapy: Patient Spontanous Breathing and Patient connected to nasal cannula oxygen  Post-op Assessment: Report given to PACU RN, Post -op Vital signs reviewed and stable and Patient moving all extremities  Post vital signs: Reviewed and stable  Complications: No apparent anesthesia complications 

## 2014-01-12 NOTE — Transfer of Care (Addendum)
Immediate Anesthesia Transfer of Care Note  Patient: [name]  Procedure(s) Performed: Procedure(s): HERNIA REPAIR INCISIONAL WITH MESH (N/A) INSERTION OF MESH (N/A)  Patient Location: PACU  Anesthesia Type:General  Level of Consciousness: awake, alert , oriented, patient cooperative and responds to stimulation  Airway & Oxygen Therapy: Patient Spontanous Breathing and Patient connected to nasal cannula oxygen  Post-op Assessment: Report given to PACU RN, Post -op Vital signs reviewed and stable and Patient moving all extremities  Post vital signs: Reviewed and stable  Complications: No apparent anesthesia complications 

## 2014-01-12 NOTE — Anesthesia Postprocedure Evaluation (Signed)
  Anesthesia Post-op Note  Patient: [name]  Procedure(s) Performed: Procedure(s): HERNIA REPAIR INCISIONAL WITH MESH (N/A) INSERTION OF MESH (N/A)  Patient Location: PACU  Anesthesia Type:General  Level of Consciousness: awake, alert , oriented, patient cooperative and responds to stimulation  Airway and Oxygen Therapy: Patient Spontanous Breathing  Post-op Pain: none  Post-op Assessment: Post-op Vital signs reviewed, Patient's Cardiovascular Status Stable, Respiratory Function Stable, Patent Airway and Pain level controlled  Post-op Vital Signs: Reviewed and stable  Last Vitals:  Filed Vitals:   01/12/14 1245  BP:   Pulse: 81  Temp:   Resp: 19    Complications: No apparent anesthesia complications

## 2014-01-12 NOTE — Transfer of Care (Signed)
Immediate Anesthesia Transfer of Care Note  Patient: [name]  Procedure(s) Performed: Procedure(s): HERNIA REPAIR INCISIONAL WITH MESH (N/A) INSERTION OF MESH (N/A)  Patient Location: PACU  Anesthesia Type:General  Level of Consciousness: awake, alert , oriented and patient cooperative  Airway & Oxygen Therapy: Patient Spontanous Breathing and Patient connected to nasal cannula oxygen  Post-op Assessment: Report given to PACU RN, Post -op Vital signs reviewed and stable and Patient moving all extremities  Post vital signs: Reviewed and stable  Complications: No apparent anesthesia complications

## 2014-01-12 NOTE — Anesthesia Procedure Notes (Signed)
Procedure Name: Intubation Date/Time: 01/12/2014 11:45 AM Performed by: Glynn Octave E Pre-anesthesia Checklist: Patient identified, Patient being monitored, Timeout performed, Emergency Drugs available and Suction available Patient Re-evaluated:Patient Re-evaluated prior to inductionOxygen Delivery Method: Circle System Utilized Preoxygenation: Pre-oxygenation with 100% oxygen Intubation Type: IV induction, Rapid sequence and Cricoid Pressure applied Ventilation: Mask ventilation without difficulty Laryngoscope Size: Mac and 3 Grade View: Grade I Tube type: Oral Tube size: 7.0 mm Number of attempts: 1 Airway Equipment and Method: stylet Placement Confirmation: ETT inserted through vocal cords under direct vision,  positive ETCO2 and breath sounds checked- equal and bilateral Secured at: 21 cm Tube secured with: Tape Dental Injury: Teeth and Oropharynx as per pre-operative assessment

## 2014-01-13 ENCOUNTER — Encounter (HOSPITAL_COMMUNITY): Payer: Self-pay | Admitting: General Surgery

## 2014-04-30 ENCOUNTER — Emergency Department (HOSPITAL_COMMUNITY): Payer: Medicaid Other

## 2014-04-30 ENCOUNTER — Encounter (HOSPITAL_COMMUNITY): Payer: Self-pay | Admitting: Emergency Medicine

## 2014-04-30 ENCOUNTER — Emergency Department (HOSPITAL_COMMUNITY)
Admission: EM | Admit: 2014-04-30 | Discharge: 2014-04-30 | Disposition: A | Discharge status: Home / Self Care | Payer: Medicaid Other | Attending: Emergency Medicine | Admitting: Emergency Medicine

## 2014-04-30 DIAGNOSIS — G8929 Other chronic pain: Secondary | ICD-10-CM | POA: Diagnosis not present

## 2014-04-30 DIAGNOSIS — S20211A Contusion of right front wall of thorax, initial encounter: Secondary | ICD-10-CM | POA: Diagnosis not present

## 2014-04-30 DIAGNOSIS — S300XXA Contusion of lower back and pelvis, initial encounter: Secondary | ICD-10-CM | POA: Insufficient documentation

## 2014-04-30 DIAGNOSIS — Z79899 Other long term (current) drug therapy: Secondary | ICD-10-CM | POA: Insufficient documentation

## 2014-04-30 DIAGNOSIS — I10 Essential (primary) hypertension: Secondary | ICD-10-CM | POA: Diagnosis not present

## 2014-04-30 DIAGNOSIS — F319 Bipolar disorder, unspecified: Secondary | ICD-10-CM | POA: Diagnosis not present

## 2014-04-30 DIAGNOSIS — W108XXA Fall (on) (from) other stairs and steps, initial encounter: Secondary | ICD-10-CM | POA: Insufficient documentation

## 2014-04-30 DIAGNOSIS — Y998 Other external cause status: Secondary | ICD-10-CM | POA: Insufficient documentation

## 2014-04-30 DIAGNOSIS — Z720 Tobacco use: Secondary | ICD-10-CM | POA: Insufficient documentation

## 2014-04-30 DIAGNOSIS — Y9389 Activity, other specified: Secondary | ICD-10-CM | POA: Insufficient documentation

## 2014-04-30 DIAGNOSIS — K219 Gastro-esophageal reflux disease without esophagitis: Secondary | ICD-10-CM | POA: Insufficient documentation

## 2014-04-30 DIAGNOSIS — F419 Anxiety disorder, unspecified: Secondary | ICD-10-CM | POA: Insufficient documentation

## 2014-04-30 DIAGNOSIS — Y9289 Other specified places as the place of occurrence of the external cause: Secondary | ICD-10-CM | POA: Diagnosis not present

## 2014-04-30 DIAGNOSIS — W19XXXA Unspecified fall, initial encounter: Secondary | ICD-10-CM

## 2014-04-30 DIAGNOSIS — Z8669 Personal history of other diseases of the nervous system and sense organs: Secondary | ICD-10-CM | POA: Insufficient documentation

## 2014-04-30 DIAGNOSIS — S299XXA Unspecified injury of thorax, initial encounter: Secondary | ICD-10-CM | POA: Diagnosis present

## 2014-04-30 MED ORDER — OXYCODONE-ACETAMINOPHEN 7.5-325 MG PO TABS: 1.0000 | ORAL_TABLET | ORAL | Status: DC | PRN

## 2014-04-30 MED ORDER — OXYCODONE-ACETAMINOPHEN 5-325 MG PO TABS
2.0000 | ORAL_TABLET | Freq: Once | ORAL | Status: AC
  Administered 2014-04-30: 2 via ORAL
  Filled 2014-04-30: qty 2

## 2014-04-30 MED ORDER — KETOROLAC TROMETHAMINE 60 MG/2ML IM SOLN
60.0000 mg | Freq: Once | INTRAMUSCULAR | Status: AC
  Administered 2014-04-30: 60 mg via INTRAMUSCULAR
  Filled 2014-04-30: qty 2

## 2014-04-30 NOTE — ED Notes (Signed)
Fell down 8 steps last night, initially hitting R lateral chest and then onto lower back.  Horizontal bruising just above gluteal crease w/point tenderness in lumbar region.  Tenderness at lower R rib margin w/out crepitus.  Denies SOB.  Has tried ice and NSAIDs w/out relief.

## 2014-04-30 NOTE — ED Notes (Addendum)
Pt states that he fell down 6 steps yesterday. Pt denies head injury. Pt reports pain in his ribs, back and buttocks. Pt able to ambulate

## 2014-04-30 NOTE — Discharge Instructions (Signed)
Chest Contusion A contusion is a deep bruise. Bruises happen when an injury causes bleeding under the skin. Signs of bruising include pain, puffiness (swelling), and discolored skin. The bruise may turn blue, purple, or yellow.  HOME CARE  Put ice on the injured area.  Put ice in a plastic bag.  Place a towel between the skin and the bag.  Leave the ice on for 15-20 minutes at a time, 03-04 times a day for the first 48 hours.  Only take medicine as told by your doctor.  Rest.  Take deep breaths (deep-breathing exercises) as told by your doctor.  Stop smoking if you smoke.  Do not lift objects over 5 pounds (2.3 kilograms) for 3 days or longer if told by your doctor. GET HELP RIGHT AWAY IF:   You have more bruising or puffiness.  You have pain that gets worse.  You have trouble breathing.  You are dizzy, weak, or pass out (faint).  You have blood in your pee (urine) or poop (stool).  You cough up or throw up (vomit) blood.  Your puffiness or pain is not helped with medicines. MAKE SURE YOU:   Understand these instructions.  Will watch your condition.  Will get help right away if you are not doing well or get worse. Document Released: 10/09/2007 Document Revised: 01/15/2012 Document Reviewed: 10/14/2011 [facility] Patient Information 2015 [place], [place]. This information is not intended to replace advice given to you by your health care provider. Make sure you discuss any questions you have with your health care provider.  Tailbone Injury The tailbone is the small bone at the lower end of the backbone (spine). You may have stretched tissues, bruises, or a broken bone (fracture). Most tailbone injuries get better on their own after 4 to 6 weeks. HOME CARE  Put ice on the injured area.  Put ice in a plastic bag.  Place a towel between your skin and the bag.  Leave the ice on for 15-20 minutes. Do this every hour while you are awake for 1 to 2 days.  Sit on a  large, rubber or inflated ring or cushion to lessen pain. Lean forward when you sit to help lessen pain.  Avoid sitting in one place for a long time.  Increase your activity as the pain allows.  Only take medicines as told by your doctor.  You can take medicine to help you poop (stool softeners) if it is painful to poop.  Eat foods with plenty of fiber.  Keep all doctor visits as told. GET HELP RIGHT AWAY IF:  Your pain gets worse.  Pooping causes you pain.  You cannot poop (constipation).  You have a fever. MAKE SURE YOU:  Understand these instructions.  Will watch your condition.  Will get help right away if you are not doing well or get worse. Document Released: 05/25/2010 Document Revised: 07/15/2011 Document Reviewed: 11/15/2010 [facility] Patient Information 2015 [place], [place]. This information is not intended to replace advice given to you by your health care provider. Make sure you discuss any questions you have with your health care provider.

## 2014-05-02 NOTE — ED Provider Notes (Signed)
CSN: [id number]     Arrival date & time 04/30/14  1149 History   First MD Initiated Contact with Patient 04/30/14 1301     Chief Complaint  Patient presents with  . Fall     (Consider location/radiation/quality/duration/timing/severity/associated sxs/prior Treatment) HPI  [name] is a 33 y.o. male who presents to the Emergency Department complaining of right rib and low back pain after a fall.  He states the injury occurred on the evening prior to ed arrival.  Reports slipping on wet steps and landing on his back.  He c/o pain with walking and movement including deep breathing or coughing.  He describes the pain as sharp and throbbing.  He reports pain is worse after waking up prior to ed arrival.  He denies head injury, LOC, vomiting, abd pain, shortness of breath, numbness or weakness of the extremities.  He has taken his prescription medication without relief.     Past Medical History  Diagnosis Date  . GERD (gastroesophageal reflux disease)     Bravo study Nov 2009 on Prilosec 40 BID with adequate acid suppression  . Hypertension   . Anxiety   . Depression     increased over past several months  . Chronic abdominal pain     HIDA scan Sept 2010, EF 93%, s/p chole oct 2010; [facility] at [facility] wall pain; Imipramine added May 2011  . Chronic vomiting     normal GES 2009  . S/P endoscopy     2008 Dr. Karilyn Cotaehman: erosive reflux esophagitis, antral gastritis, H.pylori serologies neg, Nov 2009 Dr. Darrick PennaFields: gastritis, benign esophageal polyp, no H.pylori,  Feb 2011: [facility], Dr. Bubba HalesBurggen: normal esophagus, normal stomach, normal duodenum, path unremarkable  . Ventral hernia   . Shortness of breath     SOB even with no exertion  . Sleep apnea     supposed to sleep with CPAP  . Hiatal hernia   . Bipolar 2 disorder    Past Surgical History  Procedure Laterality Date  . Ventral hernia repair  March 2012    Dr. Lovell SheehanJenkins  . Knee surgery Right 2009  . Cholecystectomy   10/10  . Appendectomy  5/10  . Esophagogastroduodenoscopy  05/2006    H.pylori neg  . Inguinal hernia repair      child  . Esophagogastroduodenoscopy  03/23/2008    Fields-gastritis benign esophageal polyp, otherwise normal. Negative H. pylori he (propofol)  . Incisional hernia repair  03/04/2012    Procedure: LAPAROSCOPIC INCISIONAL HERNIA;  Surgeon: Dalia HeadingMark A Jenkins, MD;  Location: [facility];  Service: General;  Laterality: N/A;  repair, recurrent  . Insertion of mesh  03/04/2012    Procedure: INSERTION OF MESH;  Surgeon: Dalia HeadingMark A Jenkins, MD;  Location: [facility];  Service: General;  Laterality: N/A;  . Incisional hernia repair N/A 01/12/2014    Procedure: HERNIA REPAIR INCISIONAL WITH MESH;  Surgeon: Dalia HeadingMark A Jenkins, MD;  Location: [facility];  Service: General;  Laterality: N/A;  . Insertion of mesh N/A 01/12/2014    Procedure: INSERTION OF MESH;  Surgeon: Dalia HeadingMark A Jenkins, MD;  Location: [facility];  Service: General;  Laterality: N/A;   Family History  Problem Relation Age of Onset  . Colon cancer Neg Hx   . Diabetes Mother    History  Substance Use Topics  . Smoking status: Current Every Day Smoker -- 1.00 packs/day for 15 years    Types: Cigarettes  . Smokeless tobacco: Never Used     Comment:  Less than 1/2 pk a day. trying to quit.  03/04/12 1255 states "was doing good, but now up to about a pack per day"  . Alcohol Use: No    Review of Systems  Constitutional: Negative for fever.  Eyes: Negative for visual disturbance.  Respiratory: Negative for shortness of breath.   Cardiovascular: Positive for chest pain (right rib pain).  Gastrointestinal: Negative for nausea, vomiting, abdominal pain and constipation.  Genitourinary: Negative for dysuria, hematuria, flank pain, decreased urine volume and difficulty urinating.       Low back pain  Musculoskeletal: Positive for back pain. Negative for joint swelling.  Skin: Negative for rash.  Neurological: Negative for syncope, facial asymmetry,  weakness, numbness and headaches.  All other systems reviewed and are negative.     Allergies  Dexilant; Propoxyphene n-acetaminophen; Zofran; and Zofran odt  Home Medications   Prior to Admission medications   Medication Sig Start Date End Date Taking? Authorizing Provider  ALPRAZolam Prudy Feeler) 1 MG tablet Take 1 mg by mouth 4 (four) times daily.    Yes Historical Provider, MD  HYDROcodone-acetaminophen (NORCO/VICODIN) 5-325 MG per tablet Take 1 tablet by mouth every 6 (six) hours as needed for moderate pain.   Yes Historical Provider, MD  ibuprofen (ADVIL,MOTRIN) 800 MG tablet Take 800 mg by mouth every 8 (eight) hours as needed for mild pain or moderate pain.   Yes Historical Provider, MD  Levomilnacipran HCl ER (FETZIMA) 80 MG CP24 Take 1 capsule by mouth daily.   Yes Historical Provider, MD  lisinopril-hydrochlorothiazide (PRINZIDE,ZESTORETIC) 20-25 MG per tablet Take 1 tablet by mouth daily. 06/23/13  Yes Kela Millin, MD  omeprazole (PRILOSEC) 40 MG capsule Take 40 mg by mouth 2 (two) times daily.   Yes Historical Provider, MD  oxyCODONE-acetaminophen (PERCOCET) 7.5-325 MG per tablet Take 1 tablet by mouth every 4 (four) hours as needed for pain. 04/30/14   [name] [name], PA-C   BP 112/69 mmHg  Pulse 68  Temp(Src) 98.1 F (36.7 C) (Oral)  Resp 16  Ht 5\' 9"  (1.753 m)  Wt 285 lb (129.275 kg)  BMI 42.07 kg/m2  SpO2 98% Physical Exam  Constitutional: He is oriented to person, place, and time. He appears well-developed and well-nourished. No distress.  HENT:  Head: Normocephalic and atraumatic.  Neck: Normal range of motion. Neck supple.  Cardiovascular: Normal rate, regular rhythm, normal heart sounds and intact distal pulses.   No murmur heard. Pulmonary/Chest: Effort normal and breath sounds normal. No respiratory distress. He has no wheezes. He has no rales. He exhibits tenderness (right lateral chest wall tenderness.  no edema, brusiing, abrasions or crepitus  noted.).  Abdominal: Soft. He exhibits no distension. There is no tenderness. There is no rebound and no guarding.  Musculoskeletal: He exhibits tenderness. He exhibits no edema.       Lumbar back: He exhibits tenderness and pain. He exhibits normal range of motion, no swelling, no deformity, no laceration and normal pulse.  Diffuse ttp of the lumbar spine and bilateral paraspinal muscles. Abrasion present just superior to the upper gluteal cleft.  DP pulses are brisk and symmetrical.  Distal sensation intact.  Hip Flexors/Extensors are intact.  Pt has 5/5 strength against resistance of bilateral lower extremities.     Neurological: He is alert and oriented to person, place, and time. He has normal strength. No sensory deficit. He exhibits normal muscle tone. Coordination and gait normal.  Reflex Scores:      Patellar reflexes are 2+  on the right side and 2+ on the left side.      Achilles reflexes are 2+ on the right side and 2+ on the left side. Skin: Skin is warm and dry. No rash noted.  Psychiatric: His behavior is normal. Thought content normal.  Nursing note and vitals reviewed.   ED Course  Procedures (including critical care time) Labs Review Labs Reviewed - No data to display  Imaging Review Dg Ribs Unilateral W/chest Right  04/30/2014   CLINICAL DATA:  Status post fall on stairs with back pain and hematoma and right lower lateral and posterior rib pain. Initial encounter.  EXAM: RIGHT RIBS AND CHEST - 3+ VIEW  COMPARISON:  PA and lateral chest 08/30/2013.  FINDINGS: The lungs are clear. No pneumothorax or pleural effusion Heart size is normal. No rib fracture is identified.  IMPRESSION: Negative exam.   Electronically Signed   By: Drusilla Kanner M.D.   On: 04/30/2014 13:30   Dg Lumbar Spine Complete  04/30/2014   CLINICAL DATA:  Fall down stairs, acute back injury and pain earlier today  EXAM: LUMBAR SPINE - COMPLETE 4+ VIEW  COMPARISON:  09/21/2012 CT reconstructions.   FINDINGS: Postop changes from ventral hernia repair. Normal lumbar spine alignment. No acute compression fracture, wedge-shaped deformity or focal kyphosis. L4 and L5 bilateral pars defects noted, better demonstrated on the CT comparison. No associated anterior slippage. Normal SI joints. Prior cholecystectomy noted. Normal bowel gas pattern.  IMPRESSION: No acute osseous finding.  L4 and L5 pars defects.   Electronically Signed   By: Ruel Favors M.D.   On: 04/30/2014 13:34     EKG Interpretation None      MDM   Final diagnoses:  Chest wall contusion, right, initial encounter  Lumbar contusion, initial encounter    Pt is well appearing, VSS.  No splinting on exam.  No focal neuro deficits on exam.  Ambulates with a steady gait.  No concerning symptoms for emergent neurological or infectious process.  abd remains soft, NT.  No cervical tenderness. Pt agrees to symptomatic tx and close f/u with his PMD in 2-3 days for recheck and advised to return here if the sx's worsen.       Abie Killian L. Trisha Mangle, PA-C 05/02/14 1433  Flint Melter, MD 05/02/14 [phone]

## 2014-05-04 ENCOUNTER — Emergency Department (HOSPITAL_COMMUNITY)
Admission: EM | Admit: 2014-05-04 | Discharge: 2014-05-04 | Disposition: A | Discharge status: Home / Self Care | Payer: Medicaid Other | Attending: Emergency Medicine | Admitting: Emergency Medicine

## 2014-05-04 ENCOUNTER — Encounter (HOSPITAL_COMMUNITY): Payer: Self-pay

## 2014-05-04 ENCOUNTER — Emergency Department (HOSPITAL_COMMUNITY): Payer: Medicaid Other

## 2014-05-04 DIAGNOSIS — G4730 Sleep apnea, unspecified: Secondary | ICD-10-CM | POA: Insufficient documentation

## 2014-05-04 DIAGNOSIS — G8929 Other chronic pain: Secondary | ICD-10-CM | POA: Diagnosis not present

## 2014-05-04 DIAGNOSIS — R109 Unspecified abdominal pain: Secondary | ICD-10-CM | POA: Insufficient documentation

## 2014-05-04 DIAGNOSIS — W19XXXS Unspecified fall, sequela: Secondary | ICD-10-CM | POA: Insufficient documentation

## 2014-05-04 DIAGNOSIS — Z720 Tobacco use: Secondary | ICD-10-CM | POA: Insufficient documentation

## 2014-05-04 DIAGNOSIS — M545 Low back pain: Secondary | ICD-10-CM | POA: Diagnosis present

## 2014-05-04 DIAGNOSIS — F329 Major depressive disorder, single episode, unspecified: Secondary | ICD-10-CM | POA: Diagnosis not present

## 2014-05-04 DIAGNOSIS — I10 Essential (primary) hypertension: Secondary | ICD-10-CM | POA: Diagnosis not present

## 2014-05-04 DIAGNOSIS — Z79899 Other long term (current) drug therapy: Secondary | ICD-10-CM | POA: Insufficient documentation

## 2014-05-04 DIAGNOSIS — K219 Gastro-esophageal reflux disease without esophagitis: Secondary | ICD-10-CM | POA: Insufficient documentation

## 2014-05-04 DIAGNOSIS — Z9049 Acquired absence of other specified parts of digestive tract: Secondary | ICD-10-CM | POA: Insufficient documentation

## 2014-05-04 DIAGNOSIS — S300XXS Contusion of lower back and pelvis, sequela: Secondary | ICD-10-CM | POA: Diagnosis not present

## 2014-05-04 DIAGNOSIS — F419 Anxiety disorder, unspecified: Secondary | ICD-10-CM | POA: Insufficient documentation

## 2014-05-04 DIAGNOSIS — Z9981 Dependence on supplemental oxygen: Secondary | ICD-10-CM | POA: Diagnosis not present

## 2014-05-04 DIAGNOSIS — S20221S Contusion of right back wall of thorax, sequela: Secondary | ICD-10-CM

## 2014-05-04 DIAGNOSIS — W19XXXA Unspecified fall, initial encounter: Secondary | ICD-10-CM

## 2014-05-04 MED ORDER — OXYCODONE-ACETAMINOPHEN 7.5-325 MG PO TABS: 1.0000 | ORAL_TABLET | ORAL | Status: DC | PRN

## 2014-05-04 MED ORDER — KETOROLAC TROMETHAMINE 60 MG/2ML IM SOLN
60.0000 mg | Freq: Once | INTRAMUSCULAR | Status: AC
  Administered 2014-05-04: 60 mg via INTRAMUSCULAR
  Filled 2014-05-04: qty 2

## 2014-05-04 MED ORDER — OXYCODONE-ACETAMINOPHEN 5-325 MG PO TABS
2.0000 | ORAL_TABLET | Freq: Once | ORAL | Status: AC
  Administered 2014-05-04: 2 via ORAL
  Filled 2014-05-04: qty 2

## 2014-05-04 NOTE — ED Provider Notes (Signed)
CSN: [id number]     Arrival date & time 05/04/14  1223 History   First MD Initiated Contact with Patient 05/04/14 1333     Chief Complaint  Patient presents with  . Back Pain     (Consider location/radiation/quality/duration/timing/severity/associated sxs/prior Treatment) Patient is a 33 y.o. male presenting with back pain. The history is provided by the patient. No language interpreter was used.  Back Pain Location:  Lumbar spine Quality:  Aching Pain severity:  Severe Timing:  Constant Progression:  Worsening Chronicity:  New Relieved by:  Nothing Worsened by:  Nothing tried Ineffective treatments:  None tried Associated symptoms: numbness     Past Medical History  Diagnosis Date  . GERD (gastroesophageal reflux disease)     Bravo study Nov 2009 on Prilosec 40 BID with adequate acid suppression  . Hypertension   . Anxiety   . Depression     increased over past several months  . Chronic abdominal pain     HIDA scan Sept 2010, EF 93%, s/p chole oct 2010; evaluated at [facility] for abdominal wall pain; Imipramine added May 2011  . Chronic vomiting     normal GES 2009  . S/P endoscopy     2008 Dr. Karilyn Cota: erosive reflux esophagitis, antral gastritis, H.pylori serologies neg, Nov 2009 Dr. Darrick Penna: gastritis, benign esophageal polyp, no H.pylori,  Feb 2011: [facility], Dr. Bubba Hales: normal esophagus, normal stomach, normal duodenum, path unremarkable  . Ventral hernia   . Shortness of breath     SOB even with no exertion  . Sleep apnea     supposed to sleep with CPAP  . Hiatal hernia   . Bipolar 2 disorder    Past Surgical History  Procedure Laterality Date  . Ventral hernia repair  March 2012    Dr. Lovell Sheehan  . Knee surgery Right 2009  . Cholecystectomy  10/10  . Appendectomy  5/10  . Esophagogastroduodenoscopy  05/2006    H.pylori neg  . Inguinal hernia repair      child  . Esophagogastroduodenoscopy  03/23/2008    Fields-gastritis benign esophageal polyp, otherwise  normal. Negative H. pylori he (propofol)  . Incisional hernia repair  03/04/2012    Procedure: LAPAROSCOPIC INCISIONAL HERNIA;  Surgeon: Dalia Heading, MD;  Location: [facility];  Service: General;  Laterality: N/A;  repair, recurrent  . Insertion of mesh  03/04/2012    Procedure: INSERTION OF MESH;  Surgeon: Dalia Heading, MD;  Location: [facility];  Service: General;  Laterality: N/A;  . Incisional hernia repair N/A 01/12/2014    Procedure: HERNIA REPAIR INCISIONAL WITH MESH;  Surgeon: Dalia Heading, MD;  Location: [facility];  Service: General;  Laterality: N/A;  . Insertion of mesh N/A 01/12/2014    Procedure: INSERTION OF MESH;  Surgeon: Dalia Heading, MD;  Location: [facility];  Service: General;  Laterality: N/A;   Family History  Problem Relation Age of Onset  . Colon cancer Neg Hx   . Diabetes Mother    History  Substance Use Topics  . Smoking status: Current Every Day Smoker -- 1.00 packs/day for 15 years    Types: Cigarettes  . Smokeless tobacco: Never Used     Comment: Less than 1/2 pk a day. trying to quit.  03/04/12 1255 states "was doing good, but now up to about a pack per day"  . Alcohol Use: No    Review of Systems  Genitourinary: Positive for flank pain.  Musculoskeletal: Positive for back pain.  Neurological:  Positive for numbness.  All other systems reviewed and are negative.     Allergies  Dexilant; Propoxyphene n-acetaminophen; Zofran; and Zofran odt  Home Medications   Prior to Admission medications   Medication Sig Start Date End Date Taking? Authorizing Provider  ALPRAZolam Prudy Feeler(XANAX) 1 MG tablet Take 1 mg by mouth 4 (four) times daily.    Yes Historical Provider, MD  ibuprofen (ADVIL,MOTRIN) 800 MG tablet Take 800 mg by mouth every 8 (eight) hours as needed for mild pain or moderate pain.   Yes Historical Provider, MD  Levomilnacipran HCl ER (FETZIMA) 80 MG CP24 Take 1 capsule by mouth daily.   Yes Historical Provider, MD  lisinopril-hydrochlorothiazide  (PRINZIDE,ZESTORETIC) 20-25 MG per tablet Take 1 tablet by mouth daily. 06/23/13  Yes Kela MillinAlethea Y Barrino, MD  omeprazole (PRILOSEC) 40 MG capsule Take 40 mg by mouth 2 (two) times daily.   Yes Historical Provider, MD  oxyCODONE-acetaminophen (PERCOCET) 7.5-325 MG per tablet Take 1 tablet by mouth every 4 (four) hours as needed for pain. 04/30/14  Yes Tammy L. Triplett, PA-C   BP 133/77 mmHg  Pulse 85  Temp(Src) 98 F (36.7 C) (Oral)  Resp 17  Ht 5\' 9"  (1.753 m)  Wt 280 lb (127.007 kg)  BMI 41.33 kg/m2  SpO2 98% Physical Exam  Constitutional: He is oriented to person, place, and time. He appears well-developed and well-nourished.  HENT:  Head: Normocephalic and atraumatic.  Eyes: Conjunctivae and EOM are normal. Pupils are equal, round, and reactive to light.  Neck: Normal range of motion. Neck supple.  Cardiovascular: Normal rate and regular rhythm.   Pulmonary/Chest: Effort normal and breath sounds normal.  Abdominal: Soft.  Musculoskeletal: He exhibits tenderness.  Bruised right low back  Tender to palpation  Neurological: He is alert and oriented to person, place, and time. He has normal reflexes.  Skin: Skin is warm.  Psychiatric: He has a normal mood and affect.  Nursing note and vitals reviewed.   ED Course  Procedures (including critical care time) Labs Review Labs Reviewed - No data to display  Imaging Review No results found.   EKG Interpretation None      MDM Pt reports he was told taht he might have a fracture that did not show up on xray.   Radiologist had advised ct if continued pain.   Pt given torodol and 2 percocet.   Ct shows no fracture, no herniation.    Final diagnoses:  Fall  Contusion, back, right, sequela   Pt given rx for percocet.  I advvised lidocaine otc patches ibuprofen Pt advised primary care follow up if needed.     Elson AreasLeslie K Sofia, PA-C 05/04/14 [address]1555  Leslie K Prairie RidgeSofia, [place] JerseyPA-C 05/04/14 1556  Linwood DibblesJon Knapp, MD 05/06/14 [phone]

## 2014-05-04 NOTE — ED Notes (Signed)
Fell down steps on 12/25, seen here for same. Cont to have pain rt buttock contusions present.

## 2014-05-04 NOTE — ED Notes (Signed)
Pt states he fell on Christmas Day and injured his low back. States he was evaluated here. Complain of back still hurting

## 2014-05-04 NOTE — Discharge Instructions (Signed)
Contusion °A contusion is a deep bruise. Contusions are the result of an injury that caused bleeding under the skin. The contusion may turn blue, purple, or yellow. Minor injuries will give you a painless contusion, but more severe contusions may stay painful and swollen for a few weeks.  °CAUSES  °A contusion is usually caused by a blow, trauma, or direct force to an area of the body. °SYMPTOMS  °· Swelling and redness of the injured area. °· Bruising of the injured area. °· Tenderness and soreness of the injured area. °· Pain. °DIAGNOSIS  °The diagnosis can be made by taking a history and physical exam. An X-ray, CT scan, or MRI may be needed to determine if there were any associated injuries, such as fractures. °TREATMENT  °Specific treatment will depend on what area of the body was injured. In general, the best treatment for a contusion is resting, icing, elevating, and applying cold compresses to the injured area. Over-the-counter medicines may also be recommended for pain control. Ask your caregiver what the best treatment is for your contusion. °HOME CARE INSTRUCTIONS  °· Put ice on the injured area. °¨ Put ice in a plastic bag. °¨ Place a towel between your skin and the bag. °¨ Leave the ice on for 15-20 minutes, 3-4 times a day, or as directed by your health care provider. °· Only take over-the-counter or prescription medicines for pain, discomfort, or fever as directed by your caregiver. Your caregiver may recommend avoiding anti-inflammatory medicines (aspirin, ibuprofen, and naproxen) for 48 hours because these medicines may increase bruising. °· Rest the injured area. °· If possible, elevate the injured area to reduce swelling. °SEEK IMMEDIATE MEDICAL CARE IF:  °· You have increased bruising or swelling. °· You have pain that is getting worse. °· Your swelling or pain is not relieved with medicines. °MAKE SURE YOU:  °· Understand these instructions. °· Will watch your condition. °· Will get help right  away if you are not doing well or get worse. °Document Released: 01/30/2005 Document Revised: 04/27/2013 Document Reviewed: 02/25/2011 °[employer]® Patient Information ©2015 [employer]. This information is not intended to replace advice given to you by your health care provider. Make sure you discuss any questions you have with your health care provider. ° °

## 2014-06-01 ENCOUNTER — Emergency Department (HOSPITAL_COMMUNITY)
Admission: EM | Admit: 2014-06-01 | Discharge: 2014-06-01 | Disposition: A | Discharge status: Home / Self Care | Payer: Medicaid Other | Attending: Emergency Medicine | Admitting: Emergency Medicine

## 2014-06-01 ENCOUNTER — Encounter (HOSPITAL_COMMUNITY): Payer: Self-pay | Admitting: Cardiology

## 2014-06-01 DIAGNOSIS — K219 Gastro-esophageal reflux disease without esophagitis: Secondary | ICD-10-CM | POA: Diagnosis not present

## 2014-06-01 DIAGNOSIS — Z720 Tobacco use: Secondary | ICD-10-CM | POA: Diagnosis not present

## 2014-06-01 DIAGNOSIS — G8929 Other chronic pain: Secondary | ICD-10-CM | POA: Diagnosis not present

## 2014-06-01 DIAGNOSIS — F419 Anxiety disorder, unspecified: Secondary | ICD-10-CM | POA: Diagnosis not present

## 2014-06-01 DIAGNOSIS — Z7952 Long term (current) use of systemic steroids: Secondary | ICD-10-CM | POA: Diagnosis not present

## 2014-06-01 DIAGNOSIS — Z9981 Dependence on supplemental oxygen: Secondary | ICD-10-CM | POA: Insufficient documentation

## 2014-06-01 DIAGNOSIS — Z79899 Other long term (current) drug therapy: Secondary | ICD-10-CM | POA: Diagnosis not present

## 2014-06-01 DIAGNOSIS — F3181 Bipolar II disorder: Secondary | ICD-10-CM | POA: Insufficient documentation

## 2014-06-01 DIAGNOSIS — I10 Essential (primary) hypertension: Secondary | ICD-10-CM | POA: Insufficient documentation

## 2014-06-01 DIAGNOSIS — H7291 Unspecified perforation of tympanic membrane, right ear: Secondary | ICD-10-CM | POA: Diagnosis not present

## 2014-06-01 DIAGNOSIS — H9201 Otalgia, right ear: Secondary | ICD-10-CM | POA: Diagnosis present

## 2014-06-01 MED ORDER — AMOXICILLIN 500 MG PO CAPS: 500.0000 mg | ORAL_CAPSULE | Freq: Three times a day (TID) | ORAL | Status: DC

## 2014-06-01 MED ORDER — OXYCODONE-ACETAMINOPHEN 5-325 MG PO TABS
1.0000 | ORAL_TABLET | Freq: Once | ORAL | Status: AC
  Administered 2014-06-01: 1 via ORAL
  Filled 2014-06-01: qty 1

## 2014-06-01 MED ORDER — HYDROCODONE-ACETAMINOPHEN 5-325 MG PO TABS: 1.0000 | ORAL_TABLET | ORAL | Status: DC | PRN

## 2014-06-01 MED ORDER — NEOMYCIN-POLYMYXIN-HC 3.5-10000-1 OT SUSP: 3.0000 [drp] | Freq: Three times a day (TID) | OTIC | Status: DC

## 2014-06-01 NOTE — ED Provider Notes (Signed)
CSN: [id number]     Arrival date & time 06/01/14  1524 History   First MD Initiated Contact with Patient 06/01/14 1534     Chief Complaint  Patient presents with  . Otalgia     (Consider location/radiation/quality/duration/timing/severity/associated sxs/prior Treatment) Patient is a 34 y.o. male presenting with ear pain. The history is provided by the patient.  Otalgia Location:  Right Quality:  Aching Duration:  1 day Timing:  Constant Progression:  Worsening Chronicity:  New  STERLING KEARNEY is a 34 y.o. male who presents to the ED with right ear pain. The pain started yesterday. He states that he had some drainage and cleaned his ear with a Q-tip and the pain got worse. He has had drainage from the ear.  Past Medical History  Diagnosis Date  . GERD (gastroesophageal reflux disease)     Bravo study Nov 2009 on Prilosec 40 BID with adequate acid suppression  . Hypertension   . Anxiety   . Depression     increased over past several months  . Chronic abdominal pain     HIDA scan Sept 2010, EF 93%, s/p chole oct 2010; evaluated at [facility] for abdominal wall pain; Imipramine added May 2011  . Chronic vomiting     normal GES 2009  . S/P endoscopy     2008 Dr. Karilyn Cota: erosive reflux esophagitis, antral gastritis, H.pylori serologies neg, Nov 2009 Dr. Darrick Penna: gastritis, benign esophageal polyp, no H.pylori,  Feb 2011: [facility], Dr. Bubba Hales: normal esophagus, normal stomach, normal duodenum, path unremarkable  . Ventral hernia   . Shortness of breath     SOB even with no exertion  . Sleep apnea     supposed to sleep with CPAP  . Hiatal hernia   . Bipolar 2 disorder    Past Surgical History  Procedure Laterality Date  . Ventral hernia repair  March 2012    Dr. Lovell Sheehan  . Knee surgery Right 2009  . Cholecystectomy  10/10  . Appendectomy  5/10  . Esophagogastroduodenoscopy  05/2006    H.pylori neg  . Inguinal hernia repair      child  . Esophagogastroduodenoscopy  03/23/2008     Fields-gastritis benign esophageal polyp, otherwise normal. Negative H. pylori he (propofol)  . Incisional hernia repair  03/04/2012    Procedure: LAPAROSCOPIC INCISIONAL HERNIA;  Surgeon: Dalia Heading, MD;  Location: [facility];  Service: General;  Laterality: N/A;  repair, recurrent  . Insertion of mesh  03/04/2012    Procedure: INSERTION OF MESH;  Surgeon: Dalia Heading, MD;  Location: [facility];  Service: General;  Laterality: N/A;  . Incisional hernia repair N/A 01/12/2014    Procedure: HERNIA REPAIR INCISIONAL WITH MESH;  Surgeon: Dalia Heading, MD;  Location: [facility];  Service: General;  Laterality: N/A;  . Insertion of mesh N/A 01/12/2014    Procedure: INSERTION OF MESH;  Surgeon: Dalia Heading, MD;  Location: [facility];  Service: General;  Laterality: N/A;   Family History  Problem Relation Age of Onset  . Colon cancer Neg Hx   . Diabetes Mother    History  Substance Use Topics  . Smoking status: Current Every Day Smoker -- 1.00 packs/day for 15 years    Types: Cigarettes  . Smokeless tobacco: Never Used     Comment: Less than 1/2 pk a day. trying to quit.  03/04/12 1255 states "was doing good, but now up to about a pack per day"  . Alcohol Use: No  Review of Systems  HENT: Positive for ear pain.   all other systems negative    Allergies  Dexilant; Propoxyphene n-acetaminophen; Zofran; and Zofran odt  Home Medications   Prior to Admission medications   Medication Sig Start Date End Date Taking? Authorizing Provider  ALPRAZolam Prudy Feeler) 1 MG tablet Take 1 mg by mouth 4 (four) times daily.     Historical Provider, MD  amoxicillin (AMOXIL) 500 MG capsule Take 1 capsule (500 mg total) by mouth 3 (three) times daily. 06/01/14   Hope Orlene Och, NP  HYDROcodone-acetaminophen (NORCO/VICODIN) 5-325 MG per tablet Take 1 tablet by mouth every 4 (four) hours as needed. 06/01/14   Hope Orlene Och, NP  ibuprofen (ADVIL,MOTRIN) 800 MG tablet Take 800 mg by mouth every 8 (eight) hours as needed  for mild pain or moderate pain.    Historical Provider, MD  Levomilnacipran HCl ER (FETZIMA) 80 MG CP24 Take 1 capsule by mouth daily.    Historical Provider, MD  lisinopril-hydrochlorothiazide (PRINZIDE,ZESTORETIC) 20-25 MG per tablet Take 1 tablet by mouth daily. 06/23/13   Kela Millin, MD  neomycin-polymyxin-hydrocortisone (CORTISPORIN) 3.5-10000-1 otic suspension Place 3 drops into the right ear 3 (three) times daily. 06/01/14   Hope Orlene Och, NP  omeprazole (PRILOSEC) 40 MG capsule Take 40 mg by mouth 2 (two) times daily.    Historical Provider, MD   BP 151/83 mmHg  Pulse 84  Temp(Src) 99.5 F (37.5 C) (Oral)  Resp 18  Ht  (1.753 m)  Wt 280 lb (127.007 kg)  BMI 41.33 kg/m2  SpO2 100% Physical Exam  Constitutional: He is oriented to person, place, and time. He appears well-developed and well-nourished. No distress.  HENT:  Head: Normocephalic.  Right Ear: Tympanic membrane is perforated. Decreased hearing is noted.  Left Ear: Tympanic membrane normal.  Nose: Nose normal.  Mouth/Throat: Uvula is midline, oropharynx is clear and moist and mucous membranes are normal.  Eyes: Conjunctivae and EOM are normal.  Neck: Normal range of motion. Neck supple.  Cardiovascular: Normal rate and regular rhythm.   Pulmonary/Chest: Effort normal and breath sounds normal.  Musculoskeletal: Normal range of motion.  Neurological: He is alert and oriented to person, place, and time. No cranial nerve deficit.  Skin: Skin is warm and dry.  Psychiatric: He has a normal mood and affect. His behavior is normal.  Nursing note and vitals reviewed.   ED Course  Procedures (including critical care time) Labs Review  MDM  34 y.o. male with right ear pain and drainage. Stable for d/c without mastoid tenderness. Will treat with antibiotics and pain medication and he will follow up with ENT. Discussed with the patient and all questioned fully answered. He will return here if any problems arise.   Final diagnoses:  Tympanic membrane perforation, nontraumatic, right      [facility], NP 06/02/14 2122  Benny Lennert, MD 06/21/14 [phone]

## 2014-06-01 NOTE — Discharge Instructions (Signed)
Eardrum Perforation °The eardrum is a thin, round tissue inside the ear that separates the ear canal from the middle ear. This is the tissue that detects sound and enables you to hear. The eardrum can be punctured or torn (perforated). Eardrums generally heal without help and with little or no permanent hearing loss. °CAUSES  °· Sudden pressure changes that happen in situations like scuba diving or flying in an airplane. °· Foreign objects in the ear. °· Inserting a cotton-tipped swab in the ear. °· Loud noise. °· Trauma to the ear. °SYMPTOMS  °· Hearing loss. °· Ear pain. °· Ringing in the ears. °· Discharge or bleeding from the ear. °· Dizziness. °· Vomiting. °· Facial paralysis. °HOME CARE INSTRUCTIONS  °· Keep your ear dry, as this improves healing. Swimming, diving, and showers are not allowed until healing is complete. While bathing, protect the ear by placing a piece of cotton covered with petroleum jelly in the outer ear canal. °· Only take over-the-counter or prescription medicines for pain, discomfort, or fever as directed by your caregiver. °· Blow your nose gently. Forceful blowing increases the pressure in the middle ear and may cause further injury or delay healing. °· Resume normal activities, such as showering, when the perforation has healed. Your caregiver can let you know when this has occurred. °· Talk to your caregiver before flying on an airplane. Air travel is generally allowed with a perforated eardrum. °· If your caregiver has given you a follow-up appointment, it is very important to keep that appointment. Failure to keep the appointment could result in a chronic or permanent injury, pain, hearing loss, and disability. °SEEK IMMEDIATE MEDICAL CARE IF:  °· You have bleeding or pus coming from your ear. °· You have problems with balance, dizziness, nausea, or vomiting. °· You develop increased pain. °· You have a fever. °MAKE SURE YOU:  °· Understand these instructions. °· Will watch your  condition. °· Will get help right away if you are not doing well or get worse. °Document Released: 04/19/2000 Document Revised: 07/15/2011 Document Reviewed: 04/21/2008 °ExitCare® Patient Information ©2015 [employer]. This information is not intended to replace advice given to you by your health care provider. Make sure you discuss any questions you have with your health care provider. ° °

## 2014-06-01 NOTE — ED Notes (Signed)
Right ear pain since yesterday ( cleaned ear with a q-tip)

## 2014-06-03 ENCOUNTER — Emergency Department (HOSPITAL_COMMUNITY)
Admission: EM | Admit: 2014-06-03 | Discharge: 2014-06-03 | Disposition: A | Discharge status: Home / Self Care | Payer: Medicaid Other | Attending: Emergency Medicine | Admitting: Emergency Medicine

## 2014-06-03 ENCOUNTER — Encounter (HOSPITAL_COMMUNITY): Payer: Self-pay

## 2014-06-03 DIAGNOSIS — Z79899 Other long term (current) drug therapy: Secondary | ICD-10-CM | POA: Insufficient documentation

## 2014-06-03 DIAGNOSIS — F419 Anxiety disorder, unspecified: Secondary | ICD-10-CM | POA: Diagnosis not present

## 2014-06-03 DIAGNOSIS — K219 Gastro-esophageal reflux disease without esophagitis: Secondary | ICD-10-CM | POA: Diagnosis not present

## 2014-06-03 DIAGNOSIS — H6503 Acute serous otitis media, bilateral: Secondary | ICD-10-CM | POA: Diagnosis not present

## 2014-06-03 DIAGNOSIS — Z792 Long term (current) use of antibiotics: Secondary | ICD-10-CM | POA: Diagnosis not present

## 2014-06-03 DIAGNOSIS — G8929 Other chronic pain: Secondary | ICD-10-CM | POA: Diagnosis not present

## 2014-06-03 DIAGNOSIS — R000 Tachycardia, unspecified: Secondary | ICD-10-CM | POA: Insufficient documentation

## 2014-06-03 DIAGNOSIS — Z720 Tobacco use: Secondary | ICD-10-CM | POA: Diagnosis not present

## 2014-06-03 DIAGNOSIS — Z9981 Dependence on supplemental oxygen: Secondary | ICD-10-CM | POA: Diagnosis not present

## 2014-06-03 DIAGNOSIS — I10 Essential (primary) hypertension: Secondary | ICD-10-CM | POA: Insufficient documentation

## 2014-06-03 DIAGNOSIS — G4730 Sleep apnea, unspecified: Secondary | ICD-10-CM | POA: Diagnosis not present

## 2014-06-03 DIAGNOSIS — F3181 Bipolar II disorder: Secondary | ICD-10-CM | POA: Diagnosis not present

## 2014-06-03 DIAGNOSIS — H7291 Unspecified perforation of tympanic membrane, right ear: Secondary | ICD-10-CM | POA: Insufficient documentation

## 2014-06-03 DIAGNOSIS — H9203 Otalgia, bilateral: Secondary | ICD-10-CM | POA: Diagnosis present

## 2014-06-03 MED ORDER — CEFTRIAXONE SODIUM 250 MG IJ SOLR
250.0000 mg | Freq: Once | INTRAMUSCULAR | Status: AC
  Administered 2014-06-03: 250 mg via INTRAMUSCULAR
  Filled 2014-06-03: qty 250

## 2014-06-03 MED ORDER — AMOXICILLIN-POT CLAVULANATE 875-125 MG PO TABS: 1.0000 | ORAL_TABLET | Freq: Two times a day (BID) | ORAL | Status: DC

## 2014-06-03 MED ORDER — PROMETHAZINE HCL 12.5 MG PO TABS
12.5000 mg | ORAL_TABLET | Freq: Once | ORAL | Status: AC
  Administered 2014-06-03: 12.5 mg via ORAL
  Filled 2014-06-03: qty 1

## 2014-06-03 MED ORDER — HYDROCODONE-ACETAMINOPHEN 5-325 MG PO TABS: 1.0000 | ORAL_TABLET | ORAL | Status: DC | PRN

## 2014-06-03 MED ORDER — PROMETHAZINE HCL 12.5 MG PO TABS: 12.5000 mg | ORAL_TABLET | Freq: Four times a day (QID) | ORAL | Status: DC | PRN

## 2014-06-03 MED ORDER — LIDOCAINE HCL (PF) 1 % IJ SOLN
INTRAMUSCULAR | Status: AC
  Administered 2014-06-03: 2 mL
  Filled 2014-06-03: qty 5

## 2014-06-03 MED ORDER — IBUPROFEN 400 MG PO TABS
600.0000 mg | ORAL_TABLET | Freq: Once | ORAL | Status: AC
  Administered 2014-06-03: 600 mg via ORAL
  Filled 2014-06-03: qty 2

## 2014-06-03 MED ORDER — OXYCODONE-ACETAMINOPHEN 5-325 MG PO TABS
1.0000 | ORAL_TABLET | Freq: Once | ORAL | Status: AC
  Administered 2014-06-03: 1 via ORAL
  Filled 2014-06-03: qty 1

## 2014-06-03 NOTE — ED Notes (Signed)
Pt c/o pain in r ear since Jan 27th.  Reports punctured his ear drum with a qtip.  Reports was referred to an ENT but hasn't been able to see them yet because has to have a referral from his pcp for medicaid.

## 2014-06-03 NOTE — ED Provider Notes (Addendum)
CSN: [id number]     Arrival date & time 06/03/14  1257 History   First MD Initiated Contact with Patient 06/03/14 1306     Chief Complaint  Patient presents with  . Otalgia     (Consider location/radiation/quality/duration/timing/severity/associated sxs/prior Treatment) Patient is a 34 y.o. male presenting with ear pain. The history is provided by the patient.  Otalgia Location:  Right Severity:  Severe Onset quality:  Gradual Duration:  3 days Timing:  Constant Progression:  Worsening Relieved by:  Nothing Ineffective treatments: amoxil   [name] is a 34 y.o. male who presents to the ED with bilateral ear pain. He was evaluated here 2 days ago for right ear pain and started on Amoxicillin. The pain has increased and now the left ear is hurting as well. He tried to follow up with ENT but they would not take without referral from his PCP. He could not get appointment with his PCP until 2/1. Patient states that the Amoxil makes his feel nauseated. He did not get his pain medication filled. He last took ibuprofen last night for pain.   Past Medical History  Diagnosis Date  . GERD (gastroesophageal reflux disease)     Bravo study Nov 2009 on Prilosec 40 BID with adequate acid suppression  . Hypertension   . Anxiety   . Depression     increased over past several months  . Chronic abdominal pain     HIDA scan Sept 2010, EF 93%, s/p chole oct 2010; evaluated at [facility] for abdominal wall pain; Imipramine added May 2011  . Chronic vomiting     normal GES 2009  . S/P endoscopy     2008 Dr. Karilyn Cota: erosive reflux esophagitis, antral gastritis, H.pylori serologies neg, Nov 2009 Dr. Darrick Penna: gastritis, benign esophageal polyp, no H.pylori,  Feb 2011: [facility], Dr. Bubba Hales: normal esophagus, normal stomach, normal duodenum, path unremarkable  . Ventral hernia   . Shortness of breath     SOB even with no exertion  . Sleep apnea     supposed to sleep with CPAP  . Hiatal hernia   .  Bipolar 2 disorder    Past Surgical History  Procedure Laterality Date  . Ventral hernia repair  March 2012    Dr. Lovell Sheehan  . Knee surgery Right 2009  . Cholecystectomy  10/10  . Appendectomy  5/10  . Esophagogastroduodenoscopy  05/2006    H.pylori neg  . Inguinal hernia repair      child  . Esophagogastroduodenoscopy  03/23/2008    Fields-gastritis benign esophageal polyp, otherwise normal. Negative H. pylori he (propofol)  . Incisional hernia repair  03/04/2012    Procedure: LAPAROSCOPIC INCISIONAL HERNIA;  Surgeon: Dalia Heading, MD;  Location: [facility];  Service: General;  Laterality: N/A;  repair, recurrent  . Insertion of mesh  03/04/2012    Procedure: INSERTION OF MESH;  Surgeon: Dalia Heading, MD;  Location: [facility];  Service: General;  Laterality: N/A;  . Incisional hernia repair N/A 01/12/2014    Procedure: HERNIA REPAIR INCISIONAL WITH MESH;  Surgeon: Dalia Heading, MD;  Location: [facility];  Service: General;  Laterality: N/A;  . Insertion of mesh N/A 01/12/2014    Procedure: INSERTION OF MESH;  Surgeon: Dalia Heading, MD;  Location: [facility];  Service: General;  Laterality: N/A;   Family History  Problem Relation Age of Onset  . Colon cancer Neg Hx   . Diabetes Mother    History  Substance Use Topics  .  Smoking status: Current Every Day Smoker -- 1.00 packs/day for 15 years    Types: Cigarettes  . Smokeless tobacco: Never Used     Comment: Less than 1/2 pk a day. trying to quit.  03/04/12 1255 states "was doing good, but now up to about a pack per day"  . Alcohol Use: No    Review of Systems  HENT: Positive for ear pain.   all other systems negative.    Allergies  Dexilant; Propoxyphene n-acetaminophen; Zofran; and Zofran odt  Home Medications   Prior to Admission medications   Medication Sig Start Date End Date Taking? Authorizing Provider  ALPRAZolam Prudy Feeler) 1 MG tablet Take 1 mg by mouth 4 (four) times daily.    Yes Historical Provider, MD  ibuprofen  (ADVIL,MOTRIN) 800 MG tablet Take 800 mg by mouth every 8 (eight) hours as needed for mild pain or moderate pain.   Yes Historical Provider, MD  Levomilnacipran HCl ER (FETZIMA) 80 MG CP24 Take 1 capsule by mouth daily.   Yes Historical Provider, MD  neomycin-polymyxin-hydrocortisone (CORTISPORIN) 3.5-10000-1 otic suspension Place 3 drops into the right ear 3 (three) times daily. 06/01/14  Yes Hope Orlene Och, NP  omeprazole (PRILOSEC) 40 MG capsule Take 40 mg by mouth 2 (two) times daily.   Yes Historical Provider, MD  amoxicillin-clavulanate (AUGMENTIN) 875-125 MG per tablet Take 1 tablet by mouth 2 (two) times daily. 06/03/14   Hope Orlene Och, NP  lisinopril-hydrochlorothiazide (PRINZIDE,ZESTORETIC) 20-25 MG per tablet Take 1 tablet by mouth daily. 06/23/13   Kela Millin, MD  promethazine (PHENERGAN) 12.5 MG tablet Take 1 tablet (12.5 mg total) by mouth every 6 (six) hours as needed for nausea or vomiting. 06/03/14   Hope Orlene Och, NP   BP 145/85 mmHg  Pulse 103  Temp(Src) 98 F (36.7 C) (Oral)  Resp 20  Ht  (1.753 m)  Wt 280 lb (127.007 kg)  BMI 41.33 kg/m2  SpO2 98% Physical Exam  Constitutional: He is oriented to person, place, and time. He appears well-developed and well-nourished. No distress.  HENT:  Head: Normocephalic and atraumatic.  Right Ear: There is tenderness. Tympanic membrane is perforated.  Left Ear: Tympanic membrane is erythematous.  Minimal mastoid tenderness right.   Eyes: Conjunctivae and EOM are normal.  Neck: Neck supple.  Cardiovascular: Tachycardia present.   Pulmonary/Chest: Effort normal.  Abdominal: Soft. There is no tenderness.  Musculoskeletal: Normal range of motion.  Neurological: He is alert and oriented to person, place, and time. No cranial nerve deficit.  Skin: Skin is warm and dry.  Psychiatric: He has a normal mood and affect. His behavior is normal.  Nursing note and vitals reviewed.   ED Course  Procedures   MDM  34 y.o. male with  continued pain bilateral ears after starting Amoxil 2 days ago. Will change to Augmentin. Rocephin 250 mg given IM prior to d/c. Will give Rx for pain medication (although patient did not fill his Rx for pain on last visit). He will follow up with his PCP in 4 days as scheduled. He will return here as needed for worsening symptoms. Patient stable for discharge without fever and does not appear toxic.  Final diagnoses:  Perforation of right tympanic membrane  Bilateral acute serous otitis media, recurrence not specified   Medical screening examination/treatment/procedure(s) were performed by non-physician practitioner and as supervising physician I was immediately available for consultation/collaboration.   EKG Interpretation None        [facility],  NP 06/03/14 1437  Vida Roller, MD 06/03/14 1606  Benny Lennert, MD 06/21/14 [phone]

## 2014-06-03 NOTE — Discharge Instructions (Signed)
Eardrum Perforation °The eardrum is a thin, round tissue inside the ear that separates the ear canal from the middle ear. This is the tissue that detects sound and enables you to hear. The eardrum can be punctured or torn (perforated). Eardrums generally heal without help and with little or no permanent hearing loss. °CAUSES  °· Sudden pressure changes that happen in situations like scuba diving or flying in an airplane. °· Foreign objects in the ear. °· Inserting a cotton-tipped swab in the ear. °· Loud noise. °· Trauma to the ear. °SYMPTOMS  °· Hearing loss. °· Ear pain. °· Ringing in the ears. °· Discharge or bleeding from the ear. °· Dizziness. °· Vomiting. °· Facial paralysis. °HOME CARE INSTRUCTIONS  °· Keep your ear dry, as this improves healing. Swimming, diving, and showers are not allowed until healing is complete. While bathing, protect the ear by placing a piece of cotton covered with petroleum jelly in the outer ear canal. °· Only take over-the-counter or prescription medicines for pain, discomfort, or fever as directed by your caregiver. °· Blow your nose gently. Forceful blowing increases the pressure in the middle ear and may cause further injury or delay healing. °· Resume normal activities, such as showering, when the perforation has healed. Your caregiver can let you know when this has occurred. °· Talk to your caregiver before flying on an airplane. Air travel is generally allowed with a perforated eardrum. °· If your caregiver has given you a follow-up appointment, it is very important to keep that appointment. Failure to keep the appointment could result in a chronic or permanent injury, pain, hearing loss, and disability. °SEEK IMMEDIATE MEDICAL CARE IF:  °· You have bleeding or pus coming from your ear. °· You have problems with balance, dizziness, nausea, or vomiting. °· You develop increased pain. °· You have a fever. °MAKE SURE YOU:  °· Understand these instructions. °· Will watch your  condition. °· Will get help right away if you are not doing well or get worse. °Document Released: 04/19/2000 Document Revised: 07/15/2011 Document Reviewed: 04/21/2008 °ExitCare® Patient Information ©2015 [employer]. This information is not intended to replace advice given to you by your health care provider. Make sure you discuss any questions you have with your health care provider. ° °

## 2014-06-03 NOTE — ED Notes (Signed)
Pain rt ear , seen here for same. And referred to ENT.   Says his ear is no better with the meds given and has to have referral from his MD to ENT.

## 2014-06-09 ENCOUNTER — Ambulatory Visit (INDEPENDENT_AMBULATORY_CARE_PROVIDER_SITE_OTHER): Payer: Medicaid Other | Admitting: Otolaryngology

## 2014-06-09 DIAGNOSIS — H60333 Swimmer's ear, bilateral: Secondary | ICD-10-CM

## 2014-06-16 ENCOUNTER — Ambulatory Visit (INDEPENDENT_AMBULATORY_CARE_PROVIDER_SITE_OTHER): Payer: Medicaid Other | Admitting: Otolaryngology

## 2014-11-02 ENCOUNTER — Other Ambulatory Visit (HOSPITAL_COMMUNITY): Payer: Self-pay | Admitting: Orthopaedic Surgery

## 2014-11-02 DIAGNOSIS — R52 Pain, unspecified: Secondary | ICD-10-CM

## 2014-11-15 ENCOUNTER — Ambulatory Visit (HOSPITAL_COMMUNITY)
Admission: RE | Admit: 2014-11-15 | Discharge: 2014-11-15 | Disposition: A | Discharge status: Home / Self Care | Payer: Medicaid Other | Source: Ambulatory Visit | Attending: Orthopaedic Surgery | Admitting: Orthopaedic Surgery

## 2014-11-15 DIAGNOSIS — M25571 Pain in right ankle and joints of right foot: Secondary | ICD-10-CM | POA: Diagnosis present

## 2014-11-15 DIAGNOSIS — X58XXXD Exposure to other specified factors, subsequent encounter: Secondary | ICD-10-CM | POA: Insufficient documentation

## 2014-11-15 DIAGNOSIS — R262 Difficulty in walking, not elsewhere classified: Secondary | ICD-10-CM | POA: Insufficient documentation

## 2014-11-15 DIAGNOSIS — S96811D Strain of other specified muscles and tendons at ankle and foot level, right foot, subsequent encounter: Secondary | ICD-10-CM | POA: Diagnosis not present

## 2014-11-15 DIAGNOSIS — R52 Pain, unspecified: Secondary | ICD-10-CM

## 2015-01-11 ENCOUNTER — Encounter (INDEPENDENT_AMBULATORY_CARE_PROVIDER_SITE_OTHER): Payer: Medicaid Other

## 2015-01-11 DIAGNOSIS — I868 Varicose veins of other specified sites: Secondary | ICD-10-CM

## 2015-01-23 ENCOUNTER — Emergency Department (HOSPITAL_COMMUNITY)
Admission: EM | Admit: 2015-01-23 | Discharge: 2015-01-23 | Disposition: A | Discharge status: Home / Self Care | Payer: Medicaid Other | Attending: Emergency Medicine | Admitting: Emergency Medicine

## 2015-01-23 ENCOUNTER — Emergency Department (HOSPITAL_COMMUNITY): Payer: Medicaid Other

## 2015-01-23 ENCOUNTER — Encounter (HOSPITAL_COMMUNITY): Payer: Self-pay

## 2015-01-23 DIAGNOSIS — I10 Essential (primary) hypertension: Secondary | ICD-10-CM | POA: Insufficient documentation

## 2015-01-23 DIAGNOSIS — S93402A Sprain of unspecified ligament of left ankle, initial encounter: Secondary | ICD-10-CM | POA: Diagnosis not present

## 2015-01-23 DIAGNOSIS — Z8669 Personal history of other diseases of the nervous system and sense organs: Secondary | ICD-10-CM | POA: Diagnosis not present

## 2015-01-23 DIAGNOSIS — Y9389 Activity, other specified: Secondary | ICD-10-CM | POA: Diagnosis not present

## 2015-01-23 DIAGNOSIS — S301XXA Contusion of abdominal wall, initial encounter: Secondary | ICD-10-CM | POA: Diagnosis not present

## 2015-01-23 DIAGNOSIS — Z720 Tobacco use: Secondary | ICD-10-CM | POA: Insufficient documentation

## 2015-01-23 DIAGNOSIS — Z79899 Other long term (current) drug therapy: Secondary | ICD-10-CM | POA: Insufficient documentation

## 2015-01-23 DIAGNOSIS — Y998 Other external cause status: Secondary | ICD-10-CM | POA: Diagnosis not present

## 2015-01-23 DIAGNOSIS — K219 Gastro-esophageal reflux disease without esophagitis: Secondary | ICD-10-CM | POA: Insufficient documentation

## 2015-01-23 DIAGNOSIS — G8929 Other chronic pain: Secondary | ICD-10-CM | POA: Diagnosis not present

## 2015-01-23 DIAGNOSIS — S3991XA Unspecified injury of abdomen, initial encounter: Secondary | ICD-10-CM | POA: Diagnosis present

## 2015-01-23 DIAGNOSIS — Y9289 Other specified places as the place of occurrence of the external cause: Secondary | ICD-10-CM | POA: Diagnosis not present

## 2015-01-23 DIAGNOSIS — F419 Anxiety disorder, unspecified: Secondary | ICD-10-CM | POA: Insufficient documentation

## 2015-01-23 DIAGNOSIS — F329 Major depressive disorder, single episode, unspecified: Secondary | ICD-10-CM | POA: Diagnosis not present

## 2015-01-23 LAB — CBC WITH DIFFERENTIAL/PLATELET
Basophils Absolute: 0 10*3/uL (ref 0.0–0.1)
Basophils Relative: 0 %
Eosinophils Absolute: 0.4 10*3/uL (ref 0.0–0.7)
Eosinophils Relative: 6 %
HCT: 41.6 % (ref 39.0–52.0)
Hemoglobin: 13.7 g/dL (ref 13.0–17.0)
LYMPHS ABS: 2.3 10*3/uL (ref 0.7–4.0)
LYMPHS PCT: 31 %
MCH: 31.7 pg (ref 26.0–34.0)
MCHC: 32.9 g/dL (ref 30.0–36.0)
MCV: 96.3 fL (ref 78.0–100.0)
MONO ABS: 0.5 10*3/uL (ref 0.1–1.0)
MONOS PCT: 7 %
Neutro Abs: 4 10*3/uL (ref 1.7–7.7)
Neutrophils Relative %: 56 %
Platelets: 185 10*3/uL (ref 150–400)
RBC: 4.32 MIL/uL (ref 4.22–5.81)
RDW: 13.2 % (ref 11.5–15.5)
WBC: 7.2 10*3/uL (ref 4.0–10.5)

## 2015-01-23 LAB — BASIC METABOLIC PANEL
Anion gap: 4 — ABNORMAL LOW (ref 5–15)
BUN: 9 mg/dL (ref 6–20)
CO2: 28 mmol/L (ref 22–32)
CREATININE: 0.88 mg/dL (ref 0.61–1.24)
Calcium: 8.2 mg/dL — ABNORMAL LOW (ref 8.9–10.3)
Chloride: 107 mmol/L (ref 101–111)
GFR calc Af Amer: >60 mL/min (ref >60)
GLUCOSE: 101 mg/dL — AB (ref 65–99)
POTASSIUM: 4.1 mmol/L (ref 3.5–5.1)
Sodium: 139 mmol/L (ref 135–145)

## 2015-01-23 MED ORDER — PROMETHAZINE HCL 25 MG/ML IJ SOLN
12.5000 mg | Freq: Once | INTRAMUSCULAR | Status: AC
Start: 2015-01-23 — End: 2015-01-23
  Administered 2015-01-23: 12.5 mg via INTRAVENOUS
  Filled 2015-01-23: qty 1

## 2015-01-23 MED ORDER — MORPHINE SULFATE (PF) 4 MG/ML IV SOLN
4.0000 mg | INTRAVENOUS | Status: DC | PRN
  Administered 2015-01-23 (×2): 4 mg via INTRAVENOUS
  Filled 2015-01-23 (×2): qty 1

## 2015-01-23 MED ORDER — OXYCODONE-ACETAMINOPHEN 5-325 MG PO TABS: 20.0000 | ORAL_TABLET | ORAL | Status: DC | PRN

## 2015-01-23 MED ORDER — IOHEXOL 300 MG/ML  SOLN
100.0000 mL | Freq: Once | INTRAMUSCULAR | Status: AC | PRN
  Administered 2015-01-23: 100 mL via INTRAVENOUS

## 2015-01-23 MED ORDER — MORPHINE SULFATE (PF) 4 MG/ML IV SOLN
4.0000 mg | Freq: Once | INTRAVENOUS | Status: AC
Start: 2015-01-23 — End: 2015-01-23
  Administered 2015-01-23: 4 mg via INTRAVENOUS
  Filled 2015-01-23: qty 1
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
## 2015-01-23 NOTE — Discharge Instructions (Signed)
Ankle Sprain  An ankle sprain is an injury to the strong, fibrous tissues (ligaments) that hold the bones of your ankle joint together.   CAUSES  An ankle sprain is usually caused by a fall or by twisting your ankle. Ankle sprains most commonly occur when you step on the outer edge of your foot, and your ankle turns inward. People who participate in sports are more prone to these types of injuries.   SYMPTOMS    Pain in your ankle. The pain may be present at rest or only when you are trying to stand or walk.   Swelling.   Bruising. Bruising may develop immediately or within 1 to 2 days after your injury.   Difficulty standing or walking, particularly when turning corners or changing directions.  DIAGNOSIS   Your caregiver will ask you details about your injury and perform a physical exam of your ankle to determine if you have an ankle sprain. During the physical exam, your caregiver will press on and apply pressure to specific areas of your foot and ankle. Your caregiver will try to move your ankle in certain ways. An X-ray exam may be done to be sure a bone was not broken or a ligament did not separate from one of the bones in your ankle (avulsion fracture).   TREATMENT   Certain types of braces can help stabilize your ankle. Your caregiver can make a recommendation for this. Your caregiver may recommend the use of medicine for pain. If your sprain is severe, your caregiver may refer you to a surgeon who helps to restore function to parts of your skeletal system (orthopedist) or a physical therapist.  HOME CARE INSTRUCTIONS    Apply ice to your injury for 1-2 days or as directed by your caregiver. Applying ice helps to reduce inflammation and pain.   Put ice in a plastic bag.   Place a towel between your skin and the bag.   Leave the ice on for 15-20 minutes at a time, every 2 hours while you are awake.   Only take over-the-counter or prescription medicines for pain, discomfort, or fever as directed by  your caregiver.   Elevate your injured ankle above the level of your heart as much as possible for 2-3 days.   If your caregiver recommends crutches, use them as instructed. Gradually put weight on the affected ankle. Continue to use crutches or a cane until you can walk without feeling pain in your ankle.   If you have a plaster splint, wear the splint as directed by your caregiver. Do not rest it on anything harder than a pillow for the first 24 hours. Do not put weight on it. Do not get it wet. You may take it off to take a shower or bath.   You may have been given an elastic bandage to wear around your ankle to provide support. If the elastic bandage is too tight (you have numbness or tingling in your foot or your foot becomes cold and blue), adjust the bandage to make it comfortable.   If you have an air splint, you may blow more air into it or let air out to make it more comfortable. You may take your splint off at night and before taking a shower or bath. Wiggle your toes in the splint several times per day to decrease swelling.  SEEK MEDICAL CARE IF:    You have rapidly increasing bruising or swelling.   Your toes feel extremely cold or   you lose feeling in your foot.   Your pain is not relieved with medicine.  SEEK IMMEDIATE MEDICAL CARE IF:   Your toes are numb or blue.   You have severe pain that is increasing.  MAKE SURE YOU:    Understand these instructions.   Will watch your condition.   Will get help right away if you are not doing well or get worse.  Document Released: 04/22/2005 Document Revised: 01/15/2012 Document Reviewed: 05/04/2011  ExitCare Patient Information 2015 [employer]. This information is not intended to replace advice given to you by your health care provider. Make sure you discuss any questions you have with your health care provider.  Contusion  A contusion is a deep bruise. Contusions are the result of an injury that caused bleeding under the skin. The contusion  may turn blue, purple, or yellow. Minor injuries will give you a painless contusion, but more severe contusions may stay painful and swollen for a few weeks.   CAUSES   A contusion is usually caused by a blow, trauma, or direct force to an area of the body.  SYMPTOMS    Swelling and redness of the injured area.   Bruising of the injured area.   Tenderness and soreness of the injured area.   Pain.  DIAGNOSIS   The diagnosis can be made by taking a history and physical exam. An X-ray, CT scan, or MRI may be needed to determine if there were any associated injuries, such as fractures.  TREATMENT   Specific treatment will depend on what area of the body was injured. In general, the best treatment for a contusion is resting, icing, elevating, and applying cold compresses to the injured area. Over-the-counter medicines may also be recommended for pain control. Ask your caregiver what the best treatment is for your contusion.  HOME CARE INSTRUCTIONS    Put ice on the injured area.   Put ice in a plastic bag.   Place a towel between your skin and the bag.   Leave the ice on for 15-20 minutes, 3-4 times a day, or as directed by your health care provider.   Only take over-the-counter or prescription medicines for pain, discomfort, or fever as directed by your caregiver. Your caregiver may recommend avoiding anti-inflammatory medicines (aspirin, ibuprofen, and naproxen) for 48 hours because these medicines may increase bruising.   Rest the injured area.   If possible, elevate the injured area to reduce swelling.  SEEK IMMEDIATE MEDICAL CARE IF:    You have increased bruising or swelling.   You have pain that is getting worse.   Your swelling or pain is not relieved with medicines.  MAKE SURE YOU:    Understand these instructions.   Will watch your condition.   Will get help right away if you are not doing well or get worse.  Document Released: 01/30/2005 Document Revised: 04/27/2013 Document Reviewed:  02/25/2011  Exit[employer] Patient Information 2015 [employer]. This information is not intended to replace advice given to you by your health care provider. Make sure you discuss any questions you have with your health care provider.
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
## 2015-01-25 ENCOUNTER — Encounter (HOSPITAL_COMMUNITY): Payer: Self-pay | Admitting: Emergency Medicine

## 2015-01-25 ENCOUNTER — Emergency Department (HOSPITAL_COMMUNITY)
Admission: EM | Admit: 2015-01-25 | Discharge: 2015-01-25 | Disposition: A | Discharge status: Home / Self Care | Payer: Medicaid Other | Attending: Emergency Medicine | Admitting: Emergency Medicine

## 2015-01-25 DIAGNOSIS — I10 Essential (primary) hypertension: Secondary | ICD-10-CM | POA: Diagnosis not present

## 2015-01-25 DIAGNOSIS — S30811D Abrasion of abdominal wall, subsequent encounter: Secondary | ICD-10-CM | POA: Insufficient documentation

## 2015-01-25 DIAGNOSIS — Z9889 Other specified postprocedural states: Secondary | ICD-10-CM | POA: Diagnosis not present

## 2015-01-25 DIAGNOSIS — G8929 Other chronic pain: Secondary | ICD-10-CM | POA: Diagnosis not present

## 2015-01-25 DIAGNOSIS — F419 Anxiety disorder, unspecified: Secondary | ICD-10-CM | POA: Insufficient documentation

## 2015-01-25 DIAGNOSIS — Z720 Tobacco use: Secondary | ICD-10-CM | POA: Insufficient documentation

## 2015-01-25 DIAGNOSIS — K219 Gastro-esophageal reflux disease without esophagitis: Secondary | ICD-10-CM | POA: Diagnosis not present

## 2015-01-25 DIAGNOSIS — S301XXD Contusion of abdominal wall, subsequent encounter: Secondary | ICD-10-CM | POA: Diagnosis not present

## 2015-01-25 DIAGNOSIS — E669 Obesity, unspecified: Secondary | ICD-10-CM | POA: Diagnosis not present

## 2015-01-25 DIAGNOSIS — Z79899 Other long term (current) drug therapy: Secondary | ICD-10-CM | POA: Insufficient documentation

## 2015-01-25 DIAGNOSIS — F3181 Bipolar II disorder: Secondary | ICD-10-CM | POA: Diagnosis not present

## 2015-01-25 DIAGNOSIS — R1030 Lower abdominal pain, unspecified: Secondary | ICD-10-CM | POA: Diagnosis present

## 2015-01-25 DIAGNOSIS — Z8719 Personal history of other diseases of the digestive system: Secondary | ICD-10-CM | POA: Insufficient documentation

## 2015-01-25 MED ORDER — HYDROCODONE-ACETAMINOPHEN 7.5-325 MG PO TABS: 1.0000 | ORAL_TABLET | Freq: Four times a day (QID) | ORAL | Status: DC | PRN

## 2015-01-25 MED ORDER — NAPROXEN 500 MG PO TABS: 500.0000 mg | ORAL_TABLET | Freq: Two times a day (BID) | ORAL | Status: DC

## 2015-01-25 NOTE — Discharge Instructions (Signed)
Contusion °A contusion is a deep bruise. Contusions are the result of an injury that caused bleeding under the skin. The contusion may turn blue, purple, or yellow. Minor injuries will give you a painless contusion, but more severe contusions may stay painful and swollen for a few weeks.  °CAUSES  °A contusion is usually caused by a blow, trauma, or direct force to an area of the body. °SYMPTOMS  °· Swelling and redness of the injured area. °· Bruising of the injured area. °· Tenderness and soreness of the injured area. °· Pain. °DIAGNOSIS  °The diagnosis can be made by taking a history and physical exam. An X-ray, CT scan, or MRI may be needed to determine if there were any associated injuries, such as fractures. °TREATMENT  °Specific treatment will depend on what area of the body was injured. In general, the best treatment for a contusion is resting, icing, elevating, and applying cold compresses to the injured area. Over-the-counter medicines may also be recommended for pain control. Ask your caregiver what the best treatment is for your contusion. °HOME CARE INSTRUCTIONS  °· Put ice on the injured area. °¨ Put ice in a plastic bag. °¨ Place a towel between your skin and the bag. °¨ Leave the ice on for 15-20 minutes, 3-4 times a day, or as directed by your health care provider. °· Only take over-the-counter or prescription medicines for pain, discomfort, or fever as directed by your caregiver. Your caregiver may recommend avoiding anti-inflammatory medicines (aspirin, ibuprofen, and naproxen) for 48 hours because these medicines may increase bruising. °· Rest the injured area. °· If possible, elevate the injured area to reduce swelling. °SEEK IMMEDIATE MEDICAL CARE IF:  °· You have increased bruising or swelling. °· You have pain that is getting worse. °· Your swelling or pain is not relieved with medicines. °MAKE SURE YOU:  °· Understand these instructions. °· Will watch your condition. °· Will get help right  away if you are not doing well or get worse. °Document Released: 01/30/2005 Document Revised: 04/27/2013 Document Reviewed: 02/25/2011 °[employer]® Patient Information ©2015 [employer]. This information is not intended to replace advice given to you by your health care provider. Make sure you discuss any questions you have with your health care provider. ° °
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
## 2015-03-03 ENCOUNTER — Ambulatory Visit (HOSPITAL_COMMUNITY): Payer: Medicaid Other | Attending: Orthopaedic Surgery | Admitting: Physical Therapy

## 2015-03-03 DIAGNOSIS — M6281 Muscle weakness (generalized): Secondary | ICD-10-CM | POA: Insufficient documentation

## 2015-03-03 DIAGNOSIS — M25672 Stiffness of left ankle, not elsewhere classified: Secondary | ICD-10-CM | POA: Diagnosis present

## 2015-03-03 DIAGNOSIS — R262 Difficulty in walking, not elsewhere classified: Secondary | ICD-10-CM | POA: Insufficient documentation

## 2015-03-03 DIAGNOSIS — R531 Weakness: Secondary | ICD-10-CM

## 2015-03-03 DIAGNOSIS — M25571 Pain in right ankle and joints of right foot: Secondary | ICD-10-CM | POA: Diagnosis not present

## 2015-03-03 NOTE — Therapy (Signed)
[facility] [address] [place], [place], [address] Phone: [phone]   Fax:  [phone]  Physical Therapy Evaluation  Patient Details  Name: [name] MRN: [id number] Date of Birth: Oct 22, 1980 No Data Recorded  Encounter Date: 03/03/2015      PT End of Session - 03/03/15 1428    Visit Number 1   Number of Visits 1   Authorization Type medicaid   PT Start Time 1350   PT Stop Time 1430   PT Time Calculation (min) 40 min   Activity Tolerance Patient tolerated treatment well      Past Medical History  Diagnosis Date  . GERD (gastroesophageal reflux disease)     Bravo study Nov 2009 on Prilosec 40 BID with adequate acid suppression  . Hypertension   . Anxiety   . Depression     increased over past several months  . Chronic abdominal pain     HIDA scan Sept 2010, EF 93%, s/p chole oct 2010; evaluated at [facility] for abdominal wall pain; Imipramine added May 2011  . Chronic vomiting     normal GES 2009  . S/P endoscopy     2008 Dr. Karilyn Cota: erosive reflux esophagitis, antral gastritis, H.pylori serologies neg, Nov 2009 Dr. Darrick Penna: gastritis, benign esophageal polyp, no H.pylori,  Feb 2011: [facility], Dr. Bubba Hales: normal esophagus, normal stomach, normal duodenum, path unremarkable  . Ventral hernia   . Shortness of breath     SOB even with no exertion  . Sleep apnea     supposed to sleep with CPAP  . Hiatal hernia   . Bipolar 2 disorder     Past Surgical History  Procedure Laterality Date  . Ventral hernia repair  March 2012    Dr. Lovell Sheehan  . Knee surgery Right 2009  . Cholecystectomy  10/10  . Appendectomy  5/10  . Esophagogastroduodenoscopy  05/2006    H.pylori neg  . Inguinal hernia repair      child  . Esophagogastroduodenoscopy  03/23/2008    Fields-gastritis benign esophageal polyp, otherwise normal. Negative H. pylori he (propofol)  . Incisional hernia repair  03/04/2012    Procedure: LAPAROSCOPIC  INCISIONAL HERNIA;  Surgeon: Dalia Heading, MD;  Location: [facility];  Service: General;  Laterality: N/A;  repair, recurrent  . Insertion of mesh  03/04/2012    Procedure: INSERTION OF MESH;  Surgeon: Dalia Heading, MD;  Location: [facility];  Service: General;  Laterality: N/A;  . Incisional hernia repair N/A 01/12/2014    Procedure: HERNIA REPAIR INCISIONAL WITH MESH;  Surgeon: Dalia Heading, MD;  Location: [facility];  Service: General;  Laterality: N/A;  . Insertion of mesh N/A 01/12/2014    Procedure: INSERTION OF MESH;  Surgeon: Dalia Heading, MD;  Location: [facility];  Service: General;  Laterality: N/A;    There were no vitals filed for this visit.  Visit Diagnosis:  Pain in joint, ankle and foot, right - Plan: PT plan of care cert/re-cert  Stiffness of ankle joint, left - Plan: PT plan of care cert/re-cert  Difficulty walking - Plan: PT plan of care cert/re-cert  Decreased strength - Plan: PT plan of care cert/re-cert      Subjective Assessment - 03/03/15 1354    Subjective [name] states that he dropped a 5 lb. bucket of water on the side of his ankle.  He had therapy but it has gotten worse.  He  states that the pain is continuous.  He states that he has continued to ice his ankle and he tries to walk but this causes his ankle to hurt more.  He has been referred to outpatient PT to instruct him in a HEP     How long can you sit comfortably? no problem    How long can you stand comfortably? Tends to stand on his Lt foot.  Pt can stand for 10-15 minute.    How long can you walk comfortably? sometimes he can walk for less than five minutes other times he can walk for 15 minutes.    Currently in Pain? Yes   Pain Score 8    Pain Location Ankle   Pain Orientation Right;Lateral   Pain Type Chronic pain   Pain Onset More than a month ago   Pain Frequency Other (Comment)   Aggravating Factors  activity             [facility] PT Assessment - 03/03/15 0001    Assessment   Medical Diagnosis  Rt ankle sprain   Onset Date/Surgical Date 02/04/12   Next MD Visit 06/02/15   Prior Therapy yes    Precautions   Precautions None   Balance Screen   Has the patient fallen in the past 6 months No   Has the patient had a decrease in activity level because of a fear of falling?  No   Is the patient reluctant to leave their home because of a fear of falling?  No   Prior Function   Level of Independence Independent   Vocation On disability   Leisure jet ski, fishing    Cognition   Overall Cognitive Status Within Functional Limits for tasks assessed   Functional Tests   Functional tests Single leg stance   Single Leg Stance   Comments Rt LE unable    ROM / Strength   AROM / PROM / Strength AROM;Strength   AROM   AROM Assessment Site Ankle   Right/Left Ankle Right   Right Ankle Dorsiflexion -10   Right Ankle Plantar Flexion 45   Right Ankle Inversion 20  AAROM   Right Ankle Eversion 10  AAROM   Strength   Strength Assessment Site Ankle   Right/Left Ankle Right   Right Ankle Inversion --  Pt reluctant to even go through AROM    Right Ankle Eversion --  Pt reluctant to go through AROM                           PT Education - 03/03/15 1427    Education provided Yes   Person(s) Educated Patient   Methods Explanation;Handout   Comprehension Verbalized understanding          PT Short Term Goals - 03/03/15 1432    PT SHORT TERM GOAL #1   Title I HEP   Time 1   Period Days   Status Achieved                  Plan - 03/03/15 1429    Clinical Impression Statement [name] is a 34 yo who has a chronic ankle sprain.  He has been referred to skilled physical therapy for a HEP.  Examination demonstrates decreased balance, decreased ROM and decreased strength,(pt needed assistance with full inversion/everison,and dorsiflexion) but was able to drive self to therapy.Pt insurance will only cover one visit. Pt was given a HEP for ROM, strengthening  and improved proprioception.  Pt will benefit from skilled therapeutic intervention in order to improve on the following deficits Abnormal gait;Decreased activity tolerance;Decreased balance;Pain;Decreased range of motion;Difficulty walking;Decreased strength   PT Frequency 1x / week   PT Duration --  1 wee   PT Next Visit Plan D/C to HEP    PT Home Exercise Plan given    Consulted and Agree with Plan of Care Patient         Problem List Patient Active Problem List   Diagnosis Date Noted  . Cough 06/23/2013  . Tobacco abuse counseling 06/23/2013  . Abdominal cramping 05/05/2013  . COPD (chronic obstructive pulmonary disease) with acute bronchitis (HCC) 05/05/2013  . Syncope 05/05/2013  . Depression with anxiety 05/05/2013  . Hyperglycemia 05/05/2013  . Black-out (not amnesia) 05/05/2013  . Smoking 04/16/2013  . Blackout spell 04/16/2013  . Hx of bipolar disorder 04/16/2013  . GERD (gastroesophageal reflux disease) 04/16/2013  . OSA (obstructive sleep apnea) 04/16/2013  . Dyspnea 04/16/2013  . Bipolar 2 disorder (HCC) 03/08/2013  . HTN (hypertension) 03/08/2013  . Tobacco abuse 03/08/2013  . Referred for management of medication therapy 03/08/2013  . Asthma exacerbation, non-allergic 03/08/2013  . Sore throat 03/08/2013  . Unspecified sleep apnea 03/08/2013  . Pain in joint, ankle and foot 11/25/2012  . Hyperbilirubinemia 01/10/2012  . Hernia 11/12/2011  . Obesity 11/12/2011  . Abdominal wall pain 03/06/2011  . NAUSEA WITH VOMITING 05/15/2009  . WEIGHT GAIN, ABNORMAL 03/21/2009  . EPIGASTRIC PAIN, CHRONIC 03/21/2009  . 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 SMOKER 03/20/2009  . DEPRESSION 03/20/2009  . HYPERTENSION 03/20/2009  . GERD 03/20/2009  . HEMATEMESIS 03/20/2009  . SHOULDER PAIN, RIGHT 03/20/2009  . ANOREXIA 03/20/2009  . ABDOMINAL PAIN, GENERALIZED 03/20/2009  . CANNABIS ABUSE, HX OF 03/20/2009   Virgina Organ, PT CLT [phone] 03/03/2015, 2:39 PM  [facility] [address] [place], [place], [address] Phone: [phone]   Fax:  [phone]  Name: [name] MRN: [id number] Date of Birth: 1981/04/21

## 2015-03-03 NOTE — Patient Instructions (Addendum)
Ankle Alphabet    Using left ankle and foot only, trace the letters of the alphabet. Perform A to Z. Repeat __1__ times per set. Do __1__ sets per session. Do __2__ sessions per day.  http://orth.exer.us/16   Copyright  VHI. All rights reserved.  Ankle Pump    With left leg elevated, gently flex and extend ankle. Move through full range of motion. Avoid pain. Repeat __10__ times per set. Do __1__ sets per session. Do _3___ sessions per day.  http://orth.exer.us/32   Copyright  VHI. All rights reserved.  ROM: Inversion / Eversion    With left leg relaxed, gently turn ankle and foot in and out. Move through full range of motion. Avoid pain. Repeat __10__ times per set. Do __1__ sets per session. Do ____ sessions per day. 3 http://orth.exer.us/36   Copyright  VHI. All rights reserved.  Heel Raise: Bilateral (Standing)    Rise on balls of feet. Repeat _10___ times per set. Do __1__ sets per session. Do __3__ sessions per day.  http://orth.exer.us/38   Copyright  VHI. All rights reserved.  Toe Raise (Standing)    Rock back on heels. Repeat 10____ times per set. Do __1__ sets per session. Do __2__ sessions per day.  http://orth.exer.us/42   Copyright  VHI. All rights reserved.  Dorsiflexion: Resisted    Facing anchor, tubing around left foot, pull toward face.  Repeat __10__ times per set. Do ____ sets per session. Do 2____ sessions per day. 1 http://orth.exer.us/8   Copyright  VHI. All rights reserved.  Eversion: Resisted    With right foot in tubing loop, hold tubing around other foot to resist and turn foot out. Repeat __10__ times per set. Do __1__ sets per session. Do 2____ sessions per day.  http://orth.exer.us/14   Copyright  VHI. All rights reserved.  Inversion: Resisted    Cross legs with right leg underneath, foot in tubing loop. Hold tubing around other foot to resist and turn foot in. Repeat ___10_ times per set. Do _1___ sets  per session. Do ___2_ sessions per day.  http://orth.exer.us/12   Copyright  VHI. All rights reserved.  Gastroc Stretch    Stand with right foot back, leg straight, forward leg bent. Keeping heel on floor, turned slightly out, lean into wall until stretch is felt in calf. Hold ___30_ seconds. Repeat ___3 times per set. Do __1__ sets per session. Do ___3_ sessions per day.  http://orth.exer.us/26   Copyright  VHI. All rights reserved.

## 2015-04-25 ENCOUNTER — Encounter (HOSPITAL_COMMUNITY): Payer: Self-pay | Admitting: Emergency Medicine

## 2015-04-25 ENCOUNTER — Emergency Department (HOSPITAL_COMMUNITY)
Admission: EM | Admit: 2015-04-25 | Discharge: 2015-04-25 | Disposition: A | Discharge status: Home / Self Care | Payer: Medicaid Other | Attending: Emergency Medicine | Admitting: Emergency Medicine

## 2015-04-25 DIAGNOSIS — G8929 Other chronic pain: Secondary | ICD-10-CM | POA: Insufficient documentation

## 2015-04-25 DIAGNOSIS — Z8719 Personal history of other diseases of the digestive system: Secondary | ICD-10-CM | POA: Diagnosis not present

## 2015-04-25 DIAGNOSIS — R062 Wheezing: Secondary | ICD-10-CM | POA: Insufficient documentation

## 2015-04-25 DIAGNOSIS — M545 Low back pain, unspecified: Secondary | ICD-10-CM

## 2015-04-25 DIAGNOSIS — G4489 Other headache syndrome: Secondary | ICD-10-CM | POA: Diagnosis not present

## 2015-04-25 DIAGNOSIS — Z79899 Other long term (current) drug therapy: Secondary | ICD-10-CM | POA: Insufficient documentation

## 2015-04-25 DIAGNOSIS — I10 Essential (primary) hypertension: Secondary | ICD-10-CM | POA: Insufficient documentation

## 2015-04-25 DIAGNOSIS — M542 Cervicalgia: Secondary | ICD-10-CM | POA: Diagnosis not present

## 2015-04-25 DIAGNOSIS — F17210 Nicotine dependence, cigarettes, uncomplicated: Secondary | ICD-10-CM | POA: Insufficient documentation

## 2015-04-25 DIAGNOSIS — F419 Anxiety disorder, unspecified: Secondary | ICD-10-CM | POA: Insufficient documentation

## 2015-04-25 DIAGNOSIS — F329 Major depressive disorder, single episode, unspecified: Secondary | ICD-10-CM | POA: Diagnosis not present

## 2015-04-25 DIAGNOSIS — R51 Headache: Secondary | ICD-10-CM | POA: Diagnosis present

## 2015-04-25 LAB — CBC WITH DIFFERENTIAL/PLATELET
BASOS ABS: 0 10*3/uL (ref 0.0–0.1)
Basophils Relative: 0 %
EOS ABS: 0.3 10*3/uL (ref 0.0–0.7)
EOS PCT: 4 %
HCT: 44.2 % (ref 39.0–52.0)
Hemoglobin: 15 g/dL (ref 13.0–17.0)
LYMPHS ABS: 1.8 10*3/uL (ref 0.7–4.0)
LYMPHS PCT: 25 %
MCH: 32.1 pg (ref 26.0–34.0)
MCHC: 33.9 g/dL (ref 30.0–36.0)
MCV: 94.4 fL (ref 78.0–100.0)
MONO ABS: 0.7 10*3/uL (ref 0.1–1.0)
Monocytes Relative: 9 %
Neutro Abs: 4.5 10*3/uL (ref 1.7–7.7)
Neutrophils Relative %: 62 %
PLATELETS: 203 10*3/uL (ref 150–400)
RBC: 4.68 MIL/uL (ref 4.22–5.81)
RDW: 13 % (ref 11.5–15.5)
WBC: 7.3 10*3/uL (ref 4.0–10.5)

## 2015-04-25 LAB — COMPREHENSIVE METABOLIC PANEL
ALT: 157 U/L — ABNORMAL HIGH (ref 17–63)
ANION GAP: 7 (ref 5–15)
AST: 210 U/L — AB (ref 15–41)
Albumin: 4.2 g/dL (ref 3.5–5.0)
Alkaline Phosphatase: 98 U/L (ref 38–126)
BUN: 13 mg/dL (ref 6–20)
CHLORIDE: 98 mmol/L — AB (ref 101–111)
CO2: 32 mmol/L (ref 22–32)
Calcium: 8.9 mg/dL (ref 8.9–10.3)
Creatinine, Ser: 1.3 mg/dL — ABNORMAL HIGH (ref 0.61–1.24)
GFR calc Af Amer: >60 mL/min (ref >60)
GFR calc non Af Amer: >60 mL/min (ref >60)
GLUCOSE: 125 mg/dL — AB (ref 65–99)
POTASSIUM: 4.1 mmol/L (ref 3.5–5.1)
SODIUM: 137 mmol/L (ref 135–145)
Total Bilirubin: 0.8 mg/dL (ref 0.3–1.2)
Total Protein: 7.2 g/dL (ref 6.5–8.1)

## 2015-04-25 LAB — URINE MICROSCOPIC-ADD ON

## 2015-04-25 LAB — URINALYSIS, ROUTINE W REFLEX MICROSCOPIC
Bilirubin Urine: NEGATIVE
GLUCOSE, UA: NEGATIVE mg/dL
Ketones, ur: NEGATIVE mg/dL
LEUKOCYTES UA: NEGATIVE
Nitrite: NEGATIVE
PROTEIN: NEGATIVE mg/dL
SPECIFIC GRAVITY, URINE: 1.02 (ref 1.005–1.030)
pH: 6 (ref 5.0–8.0)

## 2015-04-25 LAB — TROPONIN I

## 2015-04-25 MED ORDER — KETOROLAC TROMETHAMINE 30 MG/ML IJ SOLN
60.0000 mg | Freq: Once | INTRAMUSCULAR | Status: AC
  Administered 2015-04-25: 60 mg via INTRAMUSCULAR
  Filled 2015-04-25: qty 2

## 2015-04-25 NOTE — ED Notes (Signed)
C/o back pain, denies injury.  Rates pain 8/10.  C/o SOB and chest pain after started taking Norvasc 5 mg 04/18/15.  Pt says chest pain "comes and goes", but no chest pain now.

## 2015-04-25 NOTE — ED Provider Notes (Signed)
CSN: [id number]     Arrival date & time 04/25/15  1655 History   First MD Initiated Contact with Patient 04/25/15 1913     Chief Complaint  Patient presents with  . Back Pain  . Chest Pain  . Shortness of Breath  . Headache     (Consider location/radiation/quality/duration/timing/severity/associated sxs/prior Treatment) HPI   [name] is a 34 y.o. male resents for evaluation of persistent headache and low back pain, both of which started after seeing a new medication for high blood pressure, amlodipine. No history of similar back pain or headache. [name]'s also been awakened the last 2 nights, by chest pain which radiates to the left arm is a tingling sensation. [name] feels like it is caused by [name] reflux esophagitis. [name] denies diaphoresis, nausea, vomiting, fever, sinus congestion or cough. [name]'s using [name] usual medications, without relief. There are no other known modifying factors.   Past Medical History  Diagnosis Date  . GERD (gastroesophageal reflux disease)     Bravo study Nov 2009 on Prilosec 40 BID with adequate acid suppression  . Hypertension   . Anxiety   . Depression     increased over past several months  . Chronic abdominal pain     HIDA scan Sept 2010, EF 93%, s/p chole oct 2010; evaluated at [facility] for abdominal wall pain; Imipramine added May 2011  . Chronic vomiting     normal GES 2009  . S/P endoscopy     2008 Dr. Karilyn Cota: erosive reflux esophagitis, antral gastritis, H.pylori serologies neg, Nov 2009 Dr. Darrick Penna: gastritis, benign esophageal polyp, no H.pylori,  Feb 2011: [facility], Dr. Bubba Hales: normal esophagus, normal stomach, normal duodenum, path unremarkable  . Ventral hernia   . Shortness of breath     SOB even with no exertion  . Sleep apnea     supposed to sleep with CPAP  . Hiatal hernia   . Bipolar 2 disorder [facility])    Past Surgical History  Procedure Laterality Date  . Ventral hernia repair  March 2012    Dr. Lovell Sheehan  . Knee surgery Right 2009   . Cholecystectomy  10/10  . Appendectomy  5/10  . Esophagogastroduodenoscopy  05/2006    H.pylori neg  . Inguinal hernia repair      child  . Esophagogastroduodenoscopy  03/23/2008    Fields-gastritis benign esophageal polyp, otherwise normal. Negative H. pylori [name] (propofol)  . Incisional hernia repair  03/04/2012    Procedure: LAPAROSCOPIC INCISIONAL HERNIA;  Surgeon: Dalia Heading, MD;  Location: [facility];  Service: General;  Laterality: N/A;  repair, recurrent  . Insertion of mesh  03/04/2012    Procedure: INSERTION OF MESH;  Surgeon: Dalia Heading, MD;  Location: [facility];  Service: General;  Laterality: N/A;  . Incisional hernia repair N/A 01/12/2014    Procedure: HERNIA REPAIR INCISIONAL WITH MESH;  Surgeon: Dalia Heading, MD;  Location: [facility];  Service: General;  Laterality: N/A;  . Insertion of mesh N/A 01/12/2014    Procedure: INSERTION OF MESH;  Surgeon: Dalia Heading, MD;  Location: [facility];  Service: General;  Laterality: N/A;   Family History  Problem Relation Age of Onset  . Colon cancer Neg Hx   . Diabetes Mother    Social History  Substance Use Topics  . Smoking status: Current Every Day Smoker -- 1.00 packs/day for 15 years    Types: Cigarettes  . Smokeless tobacco: Never Used     Comment: Less than  1/2 pk a day. trying to quit.  03/04/12 1255 states "was doing good, but now up to about a pack per day"  . Alcohol Use: No    Review of Systems  All other systems reviewed and are negative.     Allergies  Dexilant; Propoxyphene n-acetaminophen; Zofran; and Zofran odt  Home Medications   Prior to Admission medications   Medication Sig Start Date End Date Taking? Authorizing Provider  ALPRAZolam Prudy Feeler) 1 MG tablet Take 1 mg by mouth 4 (four) times daily.    Yes Historical Provider, MD  amLODipine (NORVASC) 5 MG tablet Take 5 mg by mouth daily.   Yes Historical Provider, MD  HYDROcodone-acetaminophen (NORCO) 7.5-325 MG per tablet Take 1 tablet by mouth every 6  (six) hours as needed for moderate pain. 01/25/15  Yes Linwood Dibbles, MD  ibuprofen (ADVIL,MOTRIN) 800 MG tablet Take 800 mg by mouth every 8 (eight) hours as needed for mild pain or moderate pain.   Yes Historical Provider, MD  lisinopril-hydrochlorothiazide (PRINZIDE,ZESTORETIC) 20-25 MG per tablet Take 1 tablet by mouth daily. 06/23/13  Yes Kela Millin, MD  omeprazole (PRILOSEC) 40 MG capsule Take 40 mg by mouth 2 (two) times daily.   Yes Historical Provider, MD  naproxen (NAPROSYN) 500 MG tablet Take 1 tablet (500 mg total) by mouth 2 (two) times daily. Patient not taking: Reported on 04/25/2015 01/25/15   Linwood Dibbles, MD   BP 114/76 mmHg  Pulse 107  Temp(Src) 98 F (36.7 C) (Oral)  Resp 16  Ht 5\' 9"  (1.753 m)  Wt 300 lb (136.079 kg)  BMI 44.28 kg/m2  SpO2 97% Physical Exam  Constitutional: [name] is oriented to person, place, and time. [name] appears well-developed. No distress.  Morbidly obese  HENT:  Head: Normocephalic and atraumatic.  Right Ear: External ear normal.  Left Ear: External ear normal.  Eyes: Conjunctivae and EOM are normal. Pupils are equal, round, and reactive to light.  Neck: Normal range of motion and phonation normal. Neck supple.  Cardiovascular: Normal rate, regular rhythm and normal heart sounds.   Pulmonary/Chest: Effort normal. No respiratory distress. [name] has wheezes (few, scattered.). [name] exhibits no bony tenderness.  Abdominal: Soft. There is no tenderness.  Musculoskeletal: Normal range of motion.  Mild upper lumbar tenderness bilaterally without deformity or swelling.  Neurological: [name] is alert and oriented to person, place, and time. No cranial nerve deficit or sensory deficit. [name] exhibits normal muscle tone. Coordination normal.  Skin: Skin is warm, dry and intact.  Psychiatric: [name] has a normal mood and affect. [name] behavior is normal. Judgment and thought content normal.  Nursing note and vitals reviewed.   ED Course  Procedures (including critical care  time)  Medications  ketorolac (TORADOL) 30 MG/ML injection 60 mg (60 mg Intramuscular Given 04/25/15 2010)    Patient Vitals for the past 24 hrs:  BP Temp Temp src Pulse Resp SpO2 Height Weight  04/25/15 1704 114/76 mmHg 98 F (36.7 C) Oral 107 16 97 % 5\' 9"  (1.753 m) 300 lb (136.079 kg)    At discharge- Reevaluation with update and discussion. After initial assessment and treatment, an updated evaluation reveals [name] is comfortable, no further complaints. Findings discussed with the patient and all questions were answered. Hodge Stachnik L    Labs Review Labs Reviewed  COMPREHENSIVE METABOLIC PANEL - Abnormal; Notable for the following:    Chloride 98 (*)    Glucose, Bld 125 (*)    Creatinine, Ser 1.30 (*)  AST 210 (*)    ALT 157 (*)    All other components within normal limits  URINALYSIS, ROUTINE W REFLEX MICROSCOPIC (NOT AT [facility]) - Abnormal; Notable for the following:    Hgb urine dipstick MODERATE (*)    All other components within normal limits  URINE MICROSCOPIC-ADD ON - Abnormal; Notable for the following:    Squamous Epithelial / LPF 0-5 (*)    Bacteria, UA FEW (*)    All other components within normal limits  URINE CULTURE  CBC WITH DIFFERENTIAL/PLATELET  TROPONIN I    Imaging Review No results found. I have personally reviewed and evaluated these images and lab results as part of my medical decision-making.   EKG Interpretation   Date/Time:  Tuesday April 25 2015 17:11:12 EST Ventricular Rate:  103 PR Interval:  152 QRS Duration: 88 QT Interval:  338 QTC Calculation: 442 R Axis:   72 Text Interpretation:  Sinus tachycardia Otherwise normal ECG Since last  tracing of earlier today No significant change was found Confirmed by  Danyel Griess  MD, Nayelis Bonito ([id number]) on 04/25/2015 5:56:40 PM      MDM   Final diagnoses:  Bilateral low back pain without sciatica  Other headache syndrome    Nonspecific back pain and headache. Patient to supply these started  after initiation of amlodipine therapy. [name] was arrested 10% chance that these symptoms can be related to amlodipine. I doubt acute CNS abnormality, fracture, herniated disc or lumbar radiculopathy.  Nursing Notes Reviewed/ Care Coordinated Applicable Imaging Reviewed Interpretation of Laboratory Data incorporated into ED treatment  The patient appears reasonably screened and/or stabilized for discharge and I doubt any other medical condition or other [facility] requiring further screening, evaluation, or treatment in the ED at this time prior to discharge.  Plan: Home Medications- usual; Home Treatments- rest; return here if the recommended treatment, does not improve the symptoms; Recommended follow up- PCP 1 week     Mancel Bale, MD 04/25/15 2124

## 2015-04-25 NOTE — Discharge Instructions (Signed)
Back Pain, Adult °Back pain is very common in adults. The cause of back pain is rarely dangerous and the pain often gets better over time. The cause of your back pain may not be known. Some common causes of back pain include: °· Strain of the muscles or ligaments supporting the spine. °· Wear and tear (degeneration) of the spinal disks. °· Arthritis. °· Direct injury to the back. °For many people, back pain may return. Since back pain is rarely dangerous, most people can learn to manage this condition on their own. °HOME CARE INSTRUCTIONS °Watch your back pain for any changes. The following actions may help to lessen any discomfort you are feeling: °· Remain active. It is stressful on your back to sit or stand in one place for long periods of time. Do not sit, drive, or stand in one place for more than 30 minutes at a time. Take short walks on even surfaces as soon as you are able. Try to increase the length of time you walk each day. °· Exercise regularly as directed by your health care provider. Exercise helps your back heal faster. It also helps avoid future injury by keeping your muscles strong and flexible. °· Do not stay in bed. Resting more than 1-2 days can delay your recovery. °· Pay attention to your body when you bend and lift. The most comfortable positions are those that put less stress on your recovering back. Always use proper lifting techniques, including: °¨ Bending your knees. °¨ Keeping the load close to your body. °¨ Avoiding twisting. °· Find a comfortable position to sleep. Use a firm mattress and lie on your side with your knees slightly bent. If you lie on your back, put a pillow under your knees. °· Avoid feeling anxious or stressed. Stress increases muscle tension and can worsen back pain. It is important to recognize when you are anxious or stressed and learn ways to manage it, such as with exercise. °· Take medicines only as directed by your health care provider. Over-the-counter  medicines to reduce pain and inflammation are often the most helpful. Your health care provider may prescribe muscle relaxant drugs. These medicines help dull your pain so you can more quickly return to your normal activities and healthy exercise. °· Apply ice to the injured area: °¨ Put ice in a plastic bag. °¨ Place a towel between your skin and the bag. °¨ Leave the ice on for 20 minutes, 2-3 times a day for the first 2-3 days. After that, ice and heat may be alternated to reduce pain and spasms. °· Maintain a healthy weight. Excess weight puts extra stress on your back and makes it difficult to maintain good posture. °SEEK MEDICAL CARE IF: °· You have pain that is not relieved with rest or medicine. °· You have increasing pain going down into the legs or buttocks. °· You have pain that does not improve in one week. °· You have night pain. °· You lose weight. °· You have a fever or chills. °SEEK IMMEDIATE MEDICAL CARE IF:  °· You develop new bowel or bladder control problems. °· You have unusual weakness or numbness in your arms or legs. °· You develop nausea or vomiting. °· You develop abdominal pain. °· You feel faint. °  °This information is not intended to replace advice given to you by your health care provider. Make sure you discuss any questions you have with your health care provider. °  °Document Released: 04/22/2005 Document Revised: 05/13/2014 Document Reviewed: 08/24/2013 °Elsevier Interactive Patient Education ©2016 Elsevier   Inc[employer] °General Headache Without Cause °A headache is pain or discomfort felt around the head or neck area. The specific cause of a headache may not be found. There are many causes and types of headaches. A few common ones are: °· Tension headaches. °· Migraine headaches. °· Cluster headaches. °· Chronic daily headaches. °HOME CARE INSTRUCTIONS  °Watch your condition for any changes. Take these steps to help with your condition: °Managing Pain °· Take over-the-counter and  prescription medicines only as told by your health care provider. °· Lie down in a dark, quiet room when you have a headache. °· If directed, apply ice to the head and neck area: °¨ Put ice in a plastic bag. °¨ Place a towel between your skin and the bag. °¨ Leave the ice on for 20 minutes, 2-3 times per day. °· Use a heating pad or hot shower to apply heat to the head and neck area as told by your health care provider. °· Keep lights dim if bright lights bother you or make your headaches worse. °Eating and Drinking °· Eat meals on a regular schedule. °· Limit alcohol use. °· Decrease the amount of caffeine you drink, or stop drinking caffeine. °General Instructions °· Keep all follow-up visits as told by your health care provider. This is important. °· Keep a headache journal to help find out what may trigger your headaches. For example, write down: °¨ What you eat and drink. °¨ How much sleep you get. °¨ Any change to your diet or medicines. °· Try massage or other relaxation techniques. °· Limit stress. °· Sit up straight, and do not tense your muscles. °· Do not use tobacco products, including cigarettes, chewing tobacco, or e-cigarettes. If you need help quitting, ask your health care provider. °· Exercise regularly as told by your health care provider. °· Sleep on a regular schedule. Get 7-9 hours of sleep, or the amount recommended by your health care provider. °SEEK MEDICAL CARE IF:  °· Your symptoms are not helped by medicine. °· You have a headache that is different from the usual headache. °· You have nausea or you vomit. °· You have a fever. °SEEK IMMEDIATE MEDICAL CARE IF:  °· Your headache becomes severe. °· You have repeated vomiting. °· You have a stiff neck. °· You have a loss of vision. °· You have problems with speech. °· You have pain in the eye or ear. °· You have muscular weakness or loss of muscle control. °· You lose your balance or have trouble walking. °· You feel faint or pass out. °· You  have confusion. °  °This information is not intended to replace advice given to you by your health care provider. Make sure you discuss any questions you have with your health care provider. °  °Document Released: 04/22/2005 Document Revised: 01/11/2015 Document Reviewed: 08/15/2014 °Elsevier Interactive Patient Education ©2016 Elsevier Inc. ° °

## 2015-04-25 NOTE — ED Notes (Signed)
Pt continues to add complaints during and after triage.  Says he is also having problems voiding and told by friends he may have bladder infection, so he has been drinking cranberry juice.

## 2015-04-27 ENCOUNTER — Inpatient Hospital Stay (HOSPITAL_COMMUNITY)
Admission: EM | Admit: 2015-04-27 | Discharge: 2015-04-28 | DRG: 392 | Disposition: A | Discharge status: Home / Self Care | Payer: Medicaid Other | Attending: Internal Medicine | Admitting: Internal Medicine

## 2015-04-27 ENCOUNTER — Encounter (HOSPITAL_COMMUNITY): Payer: Self-pay | Admitting: *Deleted

## 2015-04-27 ENCOUNTER — Emergency Department (HOSPITAL_COMMUNITY): Payer: Medicaid Other

## 2015-04-27 DIAGNOSIS — R197 Diarrhea, unspecified: Secondary | ICD-10-CM | POA: Diagnosis not present

## 2015-04-27 DIAGNOSIS — R112 Nausea with vomiting, unspecified: Secondary | ICD-10-CM | POA: Diagnosis not present

## 2015-04-27 DIAGNOSIS — K219 Gastro-esophageal reflux disease without esophagitis: Secondary | ICD-10-CM | POA: Diagnosis present

## 2015-04-27 DIAGNOSIS — G4730 Sleep apnea, unspecified: Secondary | ICD-10-CM | POA: Diagnosis present

## 2015-04-27 DIAGNOSIS — A084 Viral intestinal infection, unspecified: Principal | ICD-10-CM | POA: Diagnosis present

## 2015-04-27 DIAGNOSIS — F3181 Bipolar II disorder: Secondary | ICD-10-CM | POA: Diagnosis present

## 2015-04-27 DIAGNOSIS — R7401 Elevation of levels of liver transaminase levels: Secondary | ICD-10-CM | POA: Diagnosis present

## 2015-04-27 DIAGNOSIS — Z833 Family history of diabetes mellitus: Secondary | ICD-10-CM

## 2015-04-27 DIAGNOSIS — I10 Essential (primary) hypertension: Secondary | ICD-10-CM | POA: Diagnosis present

## 2015-04-27 DIAGNOSIS — R740 Nonspecific elevation of levels of transaminase and lactic acid dehydrogenase [LDH]: Secondary | ICD-10-CM | POA: Diagnosis not present

## 2015-04-27 DIAGNOSIS — R109 Unspecified abdominal pain: Secondary | ICD-10-CM | POA: Diagnosis not present

## 2015-04-27 DIAGNOSIS — Z9049 Acquired absence of other specified parts of digestive tract: Secondary | ICD-10-CM

## 2015-04-27 DIAGNOSIS — F17210 Nicotine dependence, cigarettes, uncomplicated: Secondary | ICD-10-CM | POA: Diagnosis present

## 2015-04-27 LAB — COMPREHENSIVE METABOLIC PANEL
ALBUMIN: 4.5 g/dL (ref 3.5–5.0)
ALT: 109 U/L — AB (ref 17–63)
AST: 75 U/L — AB (ref 15–41)
Alkaline Phosphatase: 79 U/L (ref 38–126)
Anion gap: 10 (ref 5–15)
BUN: 15 mg/dL (ref 6–20)
CHLORIDE: 100 mmol/L — AB (ref 101–111)
CO2: 25 mmol/L (ref 22–32)
CREATININE: 0.98 mg/dL (ref 0.61–1.24)
Calcium: 9.4 mg/dL (ref 8.9–10.3)
GFR calc Af Amer: >60 mL/min (ref >60)
GLUCOSE: 111 mg/dL — AB (ref 65–99)
POTASSIUM: 3.8 mmol/L (ref 3.5–5.1)
Sodium: 135 mmol/L (ref 135–145)
Total Bilirubin: 1.6 mg/dL — ABNORMAL HIGH (ref 0.3–1.2)
Total Protein: 7.7 g/dL (ref 6.5–8.1)

## 2015-04-27 LAB — URINALYSIS, ROUTINE W REFLEX MICROSCOPIC
GLUCOSE, UA: NEGATIVE mg/dL
LEUKOCYTES UA: NEGATIVE
Nitrite: NEGATIVE
PH: 6.5 (ref 5.0–8.0)
PROTEIN: 100 mg/dL — AB
Specific Gravity, Urine: 1.02 (ref 1.005–1.030)

## 2015-04-27 LAB — CBC
HEMATOCRIT: 45.6 % (ref 39.0–52.0)
Hemoglobin: 15.9 g/dL (ref 13.0–17.0)
MCH: 32.4 pg (ref 26.0–34.0)
MCHC: 34.9 g/dL (ref 30.0–36.0)
MCV: 93.1 fL (ref 78.0–100.0)
PLATELETS: 224 10*3/uL (ref 150–400)
RBC: 4.9 MIL/uL (ref 4.22–5.81)
RDW: 12.5 % (ref 11.5–15.5)
WBC: 10.5 10*3/uL (ref 4.0–10.5)

## 2015-04-27 LAB — URINE MICROSCOPIC-ADD ON

## 2015-04-27 LAB — LIPASE, BLOOD: Lipase: 17 U/L (ref 11–51)

## 2015-04-27 MED ORDER — PROMETHAZINE HCL 25 MG/ML IJ SOLN
12.5000 mg | Freq: Four times a day (QID) | INTRAMUSCULAR | Status: DC | PRN
  Administered 2015-04-28 (×2): 12.5 mg via INTRAVENOUS
  Filled 2015-04-27 (×3): qty 1

## 2015-04-27 MED ORDER — AMLODIPINE BESYLATE 5 MG PO TABS
5.0000 mg | ORAL_TABLET | Freq: Every day | ORAL | Status: DC
  Administered 2015-04-27 – 2015-04-28 (×2): 5 mg via ORAL
  Filled 2015-04-27 (×2): qty 1

## 2015-04-27 MED ORDER — PROMETHAZINE HCL 25 MG/ML IJ SOLN
25.0000 mg | Freq: Once | INTRAMUSCULAR | Status: AC
  Administered 2015-04-27: 25 mg via INTRAVENOUS
  Filled 2015-04-27: qty 1

## 2015-04-27 MED ORDER — SODIUM CHLORIDE 0.9 % IV BOLUS (SEPSIS)
1000.0000 mL | Freq: Once | INTRAVENOUS | Status: AC
  Administered 2015-04-27: 1000 mL via INTRAVENOUS

## 2015-04-27 MED ORDER — SODIUM CHLORIDE 0.9 % IV SOLN
INTRAVENOUS | Status: DC
  Administered 2015-04-27 – 2015-04-28 (×2): via INTRAVENOUS

## 2015-04-27 MED ORDER — HYDRALAZINE HCL 20 MG/ML IJ SOLN: 10.0000 mg | INTRAMUSCULAR | Status: DC | PRN

## 2015-04-27 MED ORDER — MORPHINE SULFATE (PF) 4 MG/ML IV SOLN
4.0000 mg | Freq: Once | INTRAVENOUS | Status: AC
  Administered 2015-04-27: 4 mg via INTRAVENOUS
  Filled 2015-04-27: qty 1

## 2015-04-27 MED ORDER — ALPRAZOLAM 1 MG PO TABS
1.0000 mg | ORAL_TABLET | Freq: Four times a day (QID) | ORAL | Status: DC
  Administered 2015-04-27 – 2015-04-28 (×3): 1 mg via ORAL
  Filled 2015-04-27 (×2): qty 1
  Filled 2015-04-27: qty 2
  Filled 2015-04-27: qty 1

## 2015-04-27 MED ORDER — MORPHINE SULFATE (PF) 2 MG/ML IV SOLN
2.0000 mg | INTRAVENOUS | Status: DC | PRN
  Administered 2015-04-28: 2 mg via INTRAVENOUS
  Filled 2015-04-27: qty 1

## 2015-04-27 MED ORDER — HEPARIN SODIUM (PORCINE) 5000 UNIT/ML IJ SOLN
5000.0000 [IU] | Freq: Three times a day (TID) | INTRAMUSCULAR | Status: DC
  Administered 2015-04-27 – 2015-04-28 (×3): 5000 [IU] via SUBCUTANEOUS
  Filled 2015-04-27 (×4): qty 1

## 2015-04-27 MED ORDER — KETOROLAC TROMETHAMINE 30 MG/ML IJ SOLN
30.0000 mg | Freq: Once | INTRAMUSCULAR | Status: AC
  Administered 2015-04-27: 30 mg via INTRAVENOUS
  Filled 2015-04-27: qty 1

## 2015-04-27 MED ORDER — ONDANSETRON HCL 4 MG/2ML IJ SOLN
4.0000 mg | Freq: Once | INTRAMUSCULAR | Status: AC
  Administered 2015-04-27: 4 mg via INTRAVENOUS
  Filled 2015-04-27: qty 2

## 2015-04-27 NOTE — ED Notes (Signed)
Patient tolerated PO fluid without vomiting

## 2015-04-27 NOTE — ED Provider Notes (Signed)
CSN: [id number]     Arrival date & time 04/27/15  1535 History   First MD Initiated Contact with Patient 04/27/15 1554     Chief Complaint  Patient presents with  . Emesis     (Consider location/radiation/quality/duration/timing/severity/associated sxs/prior Treatment) Patient is a 34 y.o. male presenting with vomiting. The history is provided by the patient.  Emesis Associated symptoms: diarrhea and headaches   Associated symptoms: no chills    patient presents with nausea vomiting diarrhea. States that began a couple days ago. States he been seen for back pain was severe. States does not normally get back pain. States that he was seen in the ER and that it may have been his medication. States he went to his primary care doctor today who states he did come to the ER for IV fluids. CT stone of about 10 times and had diarrhea about 10 times. The pain is in his back. Worse with movements. No dysuria. Has some known hernias states her. If normal. No emesis with blood or blood in the stool. No fevers or chills.  Past Medical History  Diagnosis Date  . GERD (gastroesophageal reflux disease)     Bravo study Nov 2009 on Prilosec 40 BID with adequate acid suppression  . Hypertension   . Anxiety   . Depression     increased over past several months  . Chronic abdominal pain     HIDA scan Sept 2010, EF 93%, s/p chole oct 2010; evaluated at [facility] for abdominal wall pain; Imipramine added May 2011  . Chronic vomiting     normal GES 2009  . S/P endoscopy     2008 Dr. Karilyn Cota: erosive reflux esophagitis, antral gastritis, H.pylori serologies neg, Nov 2009 Dr. Darrick Penna: gastritis, benign esophageal polyp, no H.pylori,  Feb 2011: [facility], Dr. Bubba Hales: normal esophagus, normal stomach, normal duodenum, path unremarkable  . Ventral hernia   . Shortness of breath     SOB even with no exertion  . Sleep apnea     supposed to sleep with CPAP  . Hiatal hernia   . Bipolar 2 disorder [facility])    Past  Surgical History  Procedure Laterality Date  . Ventral hernia repair  March 2012    Dr. Lovell Sheehan  . Knee surgery Right 2009  . Cholecystectomy  10/10  . Appendectomy  5/10  . Esophagogastroduodenoscopy  05/2006    H.pylori neg  . Inguinal hernia repair      child  . Esophagogastroduodenoscopy  03/23/2008    Fields-gastritis benign esophageal polyp, otherwise normal. Negative H. pylori he (propofol)  . Incisional hernia repair  03/04/2012    Procedure: LAPAROSCOPIC INCISIONAL HERNIA;  Surgeon: Dalia Heading, MD;  Location: [facility];  Service: General;  Laterality: N/A;  repair, recurrent  . Insertion of mesh  03/04/2012    Procedure: INSERTION OF MESH;  Surgeon: Dalia Heading, MD;  Location: [facility];  Service: General;  Laterality: N/A;  . Incisional hernia repair N/A 01/12/2014    Procedure: HERNIA REPAIR INCISIONAL WITH MESH;  Surgeon: Dalia Heading, MD;  Location: [facility];  Service: General;  Laterality: N/A;  . Insertion of mesh N/A 01/12/2014    Procedure: INSERTION OF MESH;  Surgeon: Dalia Heading, MD;  Location: [facility];  Service: General;  Laterality: N/A;   Family History  Problem Relation Age of Onset  . Colon cancer Neg Hx   . Diabetes Mother    Social History  Substance Use Topics  . Smoking  status: Current Every Day Smoker -- 1.00 packs/day for 15 years    Types: Cigarettes  . Smokeless tobacco: Never Used     Comment: Less than 1/2 pk a day. trying to quit.  03/04/12 1255 states "was doing good, but now up to about a pack per day"  . Alcohol Use: No    Review of Systems  Constitutional: Positive for appetite change. Negative for fever and chills.  Respiratory: Negative for shortness of breath.   Cardiovascular: Negative for chest pain.  Gastrointestinal: Positive for nausea, vomiting and diarrhea.  Endocrine: Negative for polydipsia.  Genitourinary: Negative for dysuria.  Musculoskeletal: Positive for back pain.  Skin: Negative for rash.  Neurological: Positive for  headaches.  Hematological: Negative for adenopathy.      Allergies  Dexilant; Propoxyphene n-acetaminophen; Zofran; and Zofran odt  Home Medications   Prior to Admission medications   Medication Sig Start Date End Date Taking? Authorizing Provider  ALPRAZolam Prudy Feeler) 1 MG tablet Take 1 mg by mouth 4 (four) times daily.    Yes Historical Provider, MD  amLODipine (NORVASC) 5 MG tablet Take 5 mg by mouth daily.   Yes Historical Provider, MD  HYDROcodone-acetaminophen (NORCO) 7.5-325 MG per tablet Take 1 tablet by mouth every 6 (six) hours as needed for moderate pain. 01/25/15  Yes Linwood Dibbles, MD  lisinopril-hydrochlorothiazide (PRINZIDE,ZESTORETIC) 20-25 MG per tablet Take 1 tablet by mouth daily. 06/23/13  Yes Kela Millin, MD  omeprazole (PRILOSEC) 40 MG capsule Take 40 mg by mouth 2 (two) times daily.   Yes Historical Provider, MD  ibuprofen (ADVIL,MOTRIN) 800 MG tablet Take 800 mg by mouth every 8 (eight) hours as needed for mild pain or moderate pain.    Historical Provider, MD   BP 120/84 mmHg  Pulse 83  Temp(Src) 98.3 F (36.8 C) (Oral)  Resp 18  SpO2 97% Physical Exam  Constitutional: He appears well-developed.  HENT:  Head: Atraumatic.  Cardiovascular: Normal rate.   Pulmonary/Chest: Effort normal.  Abdominal: Soft. There is no rebound and no guarding.  Some palpable ventral hernias, reducible  Musculoskeletal: He exhibits tenderness.  Tenderness over lumbar area and lumbar paraspinal area. No change in color. No deformity  Neurological: He is alert.  Skin: Skin is warm.  Vitals reviewed.   ED Course  Procedures (including critical care time) Labs Review Labs Reviewed  COMPREHENSIVE METABOLIC PANEL - Abnormal; Notable for the following:    Chloride 100 (*)    Glucose, Bld 111 (*)    AST 75 (*)    ALT 109 (*)    Total Bilirubin 1.6 (*)    All other components within normal limits  URINALYSIS, ROUTINE W REFLEX MICROSCOPIC (NOT AT [facility]) - Abnormal; Notable for  the following:    Hgb urine dipstick SMALL (*)    Bilirubin Urine MODERATE (*)    Ketones, ur >80 (*)    Protein, ur 100 (*)    All other components within normal limits  URINE MICROSCOPIC-ADD ON - Abnormal; Notable for the following:    Squamous Epithelial / LPF 0-5 (*)    Bacteria, UA RARE (*)    Casts HYALINE CASTS (*)    All other components within normal limits  CBC  LIPASE, BLOOD  HEPATITIS PANEL, ACUTE    Imaging Review Dg Abd 2 Views  04/27/2015  CLINICAL DATA:  Right-sided abdominal pain with nausea, vomiting and diarrhea 3 days. Previous appendectomy. Previous hernia repair. EXAM: ABDOMEN - 2 VIEW COMPARISON:  CT 01/23/2015 FINDINGS:  Examination demonstrates a nonobstructive bowel gas pattern. There are surgical clips over the right upper quadrant. Postsurgical change compatible with previous ventral hernia repair. There are a few pelvic phleboliths. Remaining bones soft tissues are within normal. IMPRESSION: Nonobstructive bowel gas pattern. Electronically Signed   By: Elberta Fortis M.D.   On: 04/27/2015 17:05   I have personally reviewed and evaluated these images and lab results as part of my medical decision-making.   EKG Interpretation None      MDM   Final diagnoses:  Abdominal pain    Patient with nausea vomiting and diarrhea. Has not tolerated orals well here. Has grade 80 ketones. LFTs mildly elevated. Also has back pain of unknown cause. Will admit to internal medicine.    Benjiman Core, MD 04/27/15 2145

## 2015-04-27 NOTE — ED Notes (Signed)
Pt states he was told by Dr. Felecia Shelling to come to the ED for fluids. Here two days ago dx with lower back pain and states no relief in pain. States vomiting and diarrhea x 2 days ago. States he woke up during the night, sweating. NAD.

## 2015-04-27 NOTE — ED Notes (Signed)
[place] given at this time.

## 2015-04-27 NOTE — ED Notes (Signed)
Pt advised again of need for urine sample.

## 2015-04-27 NOTE — ED Notes (Signed)
Pt. C/o nausea and abd pain.

## 2015-04-27 NOTE — ED Notes (Addendum)
[name], [name]  home: [phone] Cell:  [phone]

## 2015-04-27 NOTE — H&P (Addendum)
[facility] History and Physical  [name] WUJ:[id number] DOB: 09/27/1980 DOA: 04/27/2015  Referring physician: EDP PCP: No PCP Per Patient   Chief Complaint: N/V/D   HPI: [name] is a 35 y.o. male who presents to the ED with c/o N/V/D onset 2 days ago.  He was seen here in the ED 2 days ago for severe low back pain.  Since that time has developed severe nausea and vomiting and several episodes of diarrhea.  Also having subjective fever and chills at home with sweating.  Despite PMH of chronic abdominal pain and chronic vomiting back in 2010, today's symptoms are new and unrelated he states.  Was unable to tolerate POs in the ED so being admitted.  Review of Systems: Systems reviewed.  As above, otherwise negative  Past Medical History  Diagnosis Date  . GERD (gastroesophageal reflux disease)     Bravo study Nov 2009 on Prilosec 40 BID with adequate acid suppression  . Hypertension   . Anxiety   . Depression     increased over past several months  . Chronic abdominal pain     HIDA scan Sept 2010, EF 93%, s/p chole oct 2010; evaluated at [facility] for abdominal wall pain; Imipramine added May 2011  . Chronic vomiting     normal GES 2009  . S/P endoscopy     2008 Dr. Karilyn Cota: erosive reflux esophagitis, antral gastritis, H.pylori serologies neg, Nov 2009 Dr. Darrick Penna: gastritis, benign esophageal polyp, no H.pylori,  Feb 2011: [facility], Dr. Bubba Hales: normal esophagus, normal stomach, normal duodenum, path unremarkable  . Ventral hernia   . Shortness of breath     SOB even with no exertion  . Sleep apnea     supposed to sleep with CPAP  . Hiatal hernia   . Bipolar 2 disorder [facility])    Past Surgical History  Procedure Laterality Date  . Ventral hernia repair  March 2012    Dr. Lovell Sheehan  . Knee surgery Right 2009  . Cholecystectomy  10/10  . Appendectomy  5/10  . Esophagogastroduodenoscopy  05/2006    H.pylori neg  . Inguinal hernia repair      child  .  Esophagogastroduodenoscopy  03/23/2008    Fields-gastritis benign esophageal polyp, otherwise normal. Negative H. pylori he (propofol)  . Incisional hernia repair  03/04/2012    Procedure: LAPAROSCOPIC INCISIONAL HERNIA;  Surgeon: Dalia Heading, MD;  Location: [facility];  Service: General;  Laterality: N/A;  repair, recurrent  . Insertion of mesh  03/04/2012    Procedure: INSERTION OF MESH;  Surgeon: Dalia Heading, MD;  Location: [facility];  Service: General;  Laterality: N/A;  . Incisional hernia repair N/A 01/12/2014    Procedure: HERNIA REPAIR INCISIONAL WITH MESH;  Surgeon: Dalia Heading, MD;  Location: [facility];  Service: General;  Laterality: N/A;  . Insertion of mesh N/A 01/12/2014    Procedure: INSERTION OF MESH;  Surgeon: Dalia Heading, MD;  Location: [facility];  Service: General;  Laterality: N/A;   Social History:  reports that he has been smoking Cigarettes.  He has a 15 pack-year smoking history. He has never used smokeless tobacco. He reports that he does not drink alcohol or use illicit drugs.  Allergies  Allergen Reactions  . Dexilant [Dexlansoprazole] Other (See Comments)    Pass out  . Propoxyphene N-Acetaminophen Other (See Comments)    Unknown   . Zofran Odt [Ondansetron]     vomit  . Zofran [Ondansetron Hcl]  Nausea And Vomiting    Family History  Problem Relation Age of Onset  . Colon cancer Neg Hx   . Diabetes Mother      Prior to Admission medications   Medication Sig Start Date End Date Taking? Authorizing Provider  ALPRAZolam Prudy Feeler) 1 MG tablet Take 1 mg by mouth 4 (four) times daily.    Yes Historical Provider, MD  amLODipine (NORVASC) 5 MG tablet Take 5 mg by mouth daily.   Yes Historical Provider, MD  HYDROcodone-acetaminophen (NORCO) 7.5-325 MG per tablet Take 1 tablet by mouth every 6 (six) hours as needed for moderate pain. 01/25/15  Yes Linwood Dibbles, MD  lisinopril-hydrochlorothiazide (PRINZIDE,ZESTORETIC) 20-25 MG per tablet Take 1 tablet by mouth daily. 06/23/13   Yes Kela Millin, MD  omeprazole (PRILOSEC) 40 MG capsule Take 40 mg by mouth 2 (two) times daily.   Yes Historical Provider, MD  ibuprofen (ADVIL,MOTRIN) 800 MG tablet Take 800 mg by mouth every 8 (eight) hours as needed for mild pain or moderate pain.    Historical Provider, MD   Physical Exam: Filed Vitals:   04/27/15 1851 04/27/15 2105  BP: 127/77 120/84  Pulse: 81 83  Temp:    Resp: 18 18    BP 120/84 mmHg  Pulse 83  Temp(Src) 98.3 F (36.8 C) (Oral)  Resp 18  SpO2 97%  General Appearance:    Alert, oriented, no distress, appears stated age  Head:    Normocephalic, atraumatic  Eyes:    PERRL, EOMI, sclera non-icteric        Nose:   Nares without drainage or epistaxis. Mucosa, turbinates normal  Throat:   Moist mucous membranes. Oropharynx without erythema or exudate.  Neck:   Supple. No carotid bruits.  No thyromegaly.  No lymphadenopathy.   Back:     No CVA tenderness, no spinal tenderness  Lungs:     Clear to auscultation bilaterally, without wheezes, rhonchi or rales  Chest wall:    No tenderness to palpitation  Heart:    Regular rate and rhythm without murmurs, gallops, rubs  Abdomen:     Soft, epigastric tenderness, nondistended, normal bowel sounds, no organomegaly  Genitalia:    deferred  Rectal:    deferred  Extremities:   No clubbing, cyanosis or edema.  Pulses:   2+ and symmetric all extremities  Skin:   Skin color, texture, turgor normal, no rashes or lesions  Lymph nodes:   Cervical, supraclavicular, and axillary nodes normal  Neurologic:   CNII-XII intact. Normal strength, sensation and reflexes      throughout    Labs on Admission:  Basic Metabolic Panel:  Recent Labs Lab 04/25/15 1846 04/27/15 1612  NA 137 135  K 4.1 3.8  CL 98* 100*  CO2 32 25  GLUCOSE 125* 111*  BUN 13 15  CREATININE 1.30* 0.98  CALCIUM 8.9 9.4   Liver Function Tests:  Recent Labs Lab 04/25/15 1846 04/27/15 1612  AST 210* 75*  ALT 157* 109*  ALKPHOS 98 79   BILITOT 0.8 1.6*  PROT 7.2 7.7  ALBUMIN 4.2 4.5    Recent Labs Lab 04/27/15 1612  LIPASE 17   No results for input(s): AMMONIA in the last 168 hours. CBC:  Recent Labs Lab 04/25/15 1846 04/27/15 1612  WBC 7.3 10.5  NEUTROABS 4.5  --   HGB 15.0 15.9  HCT 44.2 45.6  MCV 94.4 93.1  PLT 203 224   Cardiac Enzymes:  Recent Labs Lab 04/25/15 1846  TROPONINI <0.03    BNP (last 3 results) No results for input(s): PROBNP in the last 8760 hours. CBG: No results for input(s): GLUCAP in the last 168 hours.  Radiological Exams on Admission: Dg Abd 2 Views  04/27/2015  CLINICAL DATA:  Right-sided abdominal pain with nausea, vomiting and diarrhea 3 days. Previous appendectomy. Previous hernia repair. EXAM: ABDOMEN - 2 VIEW COMPARISON:  CT 01/23/2015 FINDINGS: Examination demonstrates a nonobstructive bowel gas pattern. There are surgical clips over the right upper quadrant. Postsurgical change compatible with previous ventral hernia repair. There are a few pelvic phleboliths. Remaining bones soft tissues are within normal. IMPRESSION: Nonobstructive bowel gas pattern. Electronically Signed   By: Elberta Fortis M.D.   On: 04/27/2015 17:05    EKG: Independently reviewed.  Assessment/Plan Principal Problem:   Nausea vomiting and diarrhea Active Problems:   Abdominal cramping   Transaminitis   1. N/V/D and abdominal cramping - viral gastroenteritis seems likely 1. Pain and nausea control 2. IVF 1. Holding HCTZ, lisinopril 2. Adding PRN hydralazine if needed 3. Repeat labs in AM 2. Transaminitis - perhaps related to his gastroenteritis? 1. Will go ahead and get abdominal US, but have noted that he did have cholecystectomy in 2010 2. Acute hepatitis pnl pending 3. Liver enzymes have actually trended down since 2 days ago before symptoms reportedly started. 4. Repeating CMP in AM    Code Status: Full  Family Communication: Family at bedside Disposition Plan: Admit to  obs   Time spent: 70 min  Crystal Ellwood M. [facility] Pager [phone]  If 7AM-7PM, please contact the day team taking care of the patient [email or web address] Password [facility] 04/27/2015, 10:12 PM

## 2015-04-27 NOTE — ED Notes (Signed)
Urine sample collected, tea-colored in appearance.

## 2015-04-28 ENCOUNTER — Observation Stay (HOSPITAL_COMMUNITY): Payer: Medicaid Other

## 2015-04-28 DIAGNOSIS — R112 Nausea with vomiting, unspecified: Secondary | ICD-10-CM

## 2015-04-28 DIAGNOSIS — I10 Essential (primary) hypertension: Secondary | ICD-10-CM | POA: Diagnosis present

## 2015-04-28 DIAGNOSIS — Z9049 Acquired absence of other specified parts of digestive tract: Secondary | ICD-10-CM | POA: Diagnosis not present

## 2015-04-28 DIAGNOSIS — R109 Unspecified abdominal pain: Secondary | ICD-10-CM

## 2015-04-28 DIAGNOSIS — R740 Nonspecific elevation of levels of transaminase and lactic acid dehydrogenase [LDH]: Secondary | ICD-10-CM | POA: Diagnosis not present

## 2015-04-28 DIAGNOSIS — A084 Viral intestinal infection, unspecified: Secondary | ICD-10-CM | POA: Diagnosis present

## 2015-04-28 DIAGNOSIS — R197 Diarrhea, unspecified: Secondary | ICD-10-CM | POA: Diagnosis not present

## 2015-04-28 DIAGNOSIS — F3181 Bipolar II disorder: Secondary | ICD-10-CM | POA: Diagnosis present

## 2015-04-28 DIAGNOSIS — F17210 Nicotine dependence, cigarettes, uncomplicated: Secondary | ICD-10-CM | POA: Diagnosis present

## 2015-04-28 DIAGNOSIS — K219 Gastro-esophageal reflux disease without esophagitis: Secondary | ICD-10-CM | POA: Diagnosis present

## 2015-04-28 DIAGNOSIS — Z833 Family history of diabetes mellitus: Secondary | ICD-10-CM | POA: Diagnosis not present

## 2015-04-28 DIAGNOSIS — G4730 Sleep apnea, unspecified: Secondary | ICD-10-CM | POA: Diagnosis present

## 2015-04-28 LAB — CBC WITH DIFFERENTIAL/PLATELET
Basophils Absolute: 0 10*3/uL (ref 0.0–0.1)
Basophils Relative: 1 %
EOS ABS: 0.3 10*3/uL (ref 0.0–0.7)
Eosinophils Relative: 3 %
HCT: 40.4 % (ref 39.0–52.0)
HEMOGLOBIN: 13.5 g/dL (ref 13.0–17.0)
LYMPHS ABS: 3.4 10*3/uL (ref 0.7–4.0)
Lymphocytes Relative: 40 %
MCH: 31.7 pg (ref 26.0–34.0)
MCHC: 33.4 g/dL (ref 30.0–36.0)
MCV: 94.8 fL (ref 78.0–100.0)
MONO ABS: 0.8 10*3/uL (ref 0.1–1.0)
MONOS PCT: 10 %
NEUTROS PCT: 46 %
Neutro Abs: 3.9 10*3/uL (ref 1.7–7.7)
Platelets: 190 10*3/uL (ref 150–400)
RBC: 4.26 MIL/uL (ref 4.22–5.81)
RDW: 12.8 % (ref 11.5–15.5)
WBC: 8.5 10*3/uL (ref 4.0–10.5)

## 2015-04-28 LAB — COMPREHENSIVE METABOLIC PANEL
ALBUMIN: 3.6 g/dL (ref 3.5–5.0)
ALK PHOS: 60 U/L (ref 38–126)
ALT: 84 U/L — AB (ref 17–63)
AST: 53 U/L — AB (ref 15–41)
Anion gap: 7 (ref 5–15)
BUN: 18 mg/dL (ref 6–20)
CHLORIDE: 104 mmol/L (ref 101–111)
CO2: 27 mmol/L (ref 22–32)
CREATININE: 1.07 mg/dL (ref 0.61–1.24)
Calcium: 8.3 mg/dL — ABNORMAL LOW (ref 8.9–10.3)
GFR calc non Af Amer: >60 mL/min (ref >60)
GLUCOSE: 89 mg/dL (ref 65–99)
Potassium: 3.6 mmol/L (ref 3.5–5.1)
SODIUM: 138 mmol/L (ref 135–145)
Total Bilirubin: 1.7 mg/dL — ABNORMAL HIGH (ref 0.3–1.2)
Total Protein: 6.2 g/dL — ABNORMAL LOW (ref 6.5–8.1)

## 2015-04-28 LAB — URINE CULTURE
CULTURE: NO GROWTH
Special Requests: NORMAL

## 2015-04-28 MED ORDER — LISINOPRIL 10 MG PO TABS: 40.0000 mg | ORAL_TABLET | Freq: Every day | ORAL | Status: DC

## 2015-04-28 MED ORDER — LISINOPRIL 40 MG PO TABS: 40.0000 mg | ORAL_TABLET | Freq: Every day | ORAL | Status: DC

## 2015-04-28 MED ORDER — FAMOTIDINE 20 MG PO TABS
20.0000 mg | ORAL_TABLET | Freq: Two times a day (BID) | ORAL | Status: DC
  Administered 2015-04-28: 20 mg via ORAL
  Filled 2015-04-28: qty 1

## 2015-04-28 MED ORDER — OXYCODONE HCL 5 MG PO TABS
10.0000 mg | ORAL_TABLET | Freq: Three times a day (TID) | ORAL | Status: DC
  Administered 2015-04-28: 10 mg via ORAL
  Filled 2015-04-28: qty 2

## 2015-04-28 NOTE — Discharge Summary (Signed)
Physician Discharge Summary  [name] AES:[id number] DOB: 12/01/1980 DOA: 04/27/2015  PCP: No PCP Per Patient  Admit date: 04/27/2015 Discharge date: 04/28/2015  Time spent: 35 minutes  Recommendations for Outpatient Follow-up:  1. Follow up with your PCP next week.    Discharge Diagnoses:  Principal Problem:   Nausea vomiting and diarrhea Active Problems:   Abdominal cramping   Transaminitis   Discharge Condition: markedly improved.  Able to ambulate.  Able to eat regular food.   Diet recommendation: Low fat diet.   Filed Weights   04/27/15 2229  Weight: 140.479 kg (309 lb 11.2 oz)    History of present illness: Patient was admitted for nausea, vomiting, diarrhea and back pain by Dr Alcario Drought on Dec 22, 16.  As per his H and P:  " [name] is a 34 y.o. male who presents to the ED with c/o N/V/D onset 2 days ago. He was seen here in the ED 2 days ago for severe low back pain. Since that time has developed severe nausea and vomiting and several episodes of diarrhea. Also having subjective fever and chills at home with sweating. Despite PMH of chronic abdominal pain and chronic vomiting back in 2010, today's symptoms are new and unrelated he states.  Was unable to tolerate POs in the ED so being admitted.  Hospital Course: Patient was admitted and made NPO, given IVF and IV antiemetics.  His back pain persisted, but improved.  His LFTs were repeated, and also went back to normal.  The alk phos was not elevated.  [place] of his abdomen was rather normal.  It was thought that his new medication " Norvasc" was causing his back pain, but I don't think so.  Nevertheless, his BP was well controlled in the hospital as in 118/70.  I suspect he should increase his Lisinopril to 32m per day, and d/c his HCTZ.  He may not need Norvasc.  He was explained that his back pain was likely Lumbago, and to have him follow up with his PCP.  Neither his abdomen nor his low back had any " red  flag" elements.  He was advanced and tolerated his regular diet.  He wishes to go home, and will be discharged home today.  I discourage his high dose NSAIDS use.  He will continue with his PPI.  Thank you and Good Day.    Procedures:  [place]Q    Normal.   Consultations:  None.   Discharge Exam: Filed Vitals:   04/28/15 0900 04/28/15 1430  BP: 118/76 137/88  Pulse: 59 63  Temp: 98.5 F (36.9 C) 98.1 F (36.7 C)  Resp: 16 12   Discharge Instructions   Discharge Instructions    Diet - low sodium heart healthy    Complete by:  As directed      Discharge instructions    Complete by:  As directed   Take your medicine as directed.  See your primary care in follow up about your back pain.     Increase activity slowly    Complete by:  As directed           Current Discharge Medication List    START taking these medications   Details  lisinopril (PRINIVIL,ZESTRIL) 40 MG tablet Take 1 tablet (40 mg total) by mouth daily. Qty: 30 tablet, Refills: 1      CONTINUE these medications which have NOT CHANGED   Details  ALPRAZolam (XANAX) 1 MG tablet Take 1  mg by mouth 4 (four) times daily.     HYDROcodone-acetaminophen (NORCO) 7.5-325 MG per tablet Take 1 tablet by mouth every 6 (six) hours as needed for moderate pain. Qty: 12 tablet, Refills: 0    omeprazole (PRILOSEC) 40 MG capsule Take 40 mg by mouth 2 (two) times daily.      STOP taking these medications     amLODipine (NORVASC) 5 MG tablet      lisinopril-hydrochlorothiazide (PRINZIDE,ZESTORETIC) 20-25 MG per tablet      ibuprofen (ADVIL,MOTRIN) 800 MG tablet        Allergies  Allergen Reactions  . Dexilant [Dexlansoprazole] Other (See Comments)    Pass out  . Propoxyphene N-Acetaminophen Other (See Comments)    Unknown   . Zofran Odt [Ondansetron]     vomit  . Zofran [Ondansetron Hcl] Nausea And Vomiting      The results of significant diagnostics from this hospitalization (including imaging,  microbiology, ancillary and laboratory) are listed below for reference.    Significant Diagnostic Studies: US Abdomen Complete  04/28/2015  CLINICAL DATA:  Elevated liver function tests EXAM: ABDOMEN ULTRASOUND COMPLETE COMPARISON:  01/23/2015 FINDINGS: Gallbladder: Surgically removed Common bile duct: Diameter: 4.8 mm Liver: No focal lesion identified. Within normal limits in parenchymal echogenicity. IVC: No abnormality visualized. Pancreas: Not well visualized due to overlying bowel gas. Spleen: Size and appearance within normal limits. Right Kidney: Length: 11.9 cm. Echogenicity within normal limits. No mass or hydronephrosis visualized. Left Kidney: Length: 12.2 cm. Echogenicity within normal limits. No mass or hydronephrosis visualized. Abdominal aorta: No aneurysm visualized. Other findings: None. IMPRESSION: Status post cholecystectomy. No acute abnormality noted. Electronically Signed   By: Inez Catalina M.D.   On: 04/28/2015 09:49   Dg Abd 2 Views  04/27/2015  CLINICAL DATA:  Right-sided abdominal pain with nausea, vomiting and diarrhea 3 days. Previous appendectomy. Previous hernia repair. EXAM: ABDOMEN - 2 VIEW COMPARISON:  CT 01/23/2015 FINDINGS: Examination demonstrates a nonobstructive bowel gas pattern. There are surgical clips over the right upper quadrant. Postsurgical change compatible with previous ventral hernia repair. There are a few pelvic phleboliths. Remaining bones soft tissues are within normal. IMPRESSION: Nonobstructive bowel gas pattern. Electronically Signed   By: Marin Olp M.D.   On: 04/27/2015 17:05    Microbiology: Recent Results (from the past 240 hour(s))  Urine culture     Status: None   Collection Time: 04/25/15  8:55 PM  Result Value Ref Range Status   Specimen Description URINE, CLEAN CATCH  Final   Special Requests Normal  Final   Culture   Final    NO GROWTH 2 DAYS Performed at [facility]    Report Status 04/28/2015 FINAL  Final      Labs: Basic Metabolic Panel:  Recent Labs Lab 04/25/15 1846 04/27/15 1612 04/28/15 0501  NA 137 135 138  K 4.1 3.8 3.6  CL 98* 100* 104  CO2 32 25 27  GLUCOSE 125* 111* 89  BUN _0 CREATININE 1.30* 0.98 1.07  CALCIUM 8.9 9.4 8.3*   Liver Function Tests:  Recent Labs Lab 04/25/15 1846 04/27/15 1612 04/28/15 0501  AST 210* 75* 53*  ALT 157* 109* 84*  ALKPHOS 98 79 60  BILITOT 0.8 1.6* 1.7*  PROT 7.2 7.7 6.2*  ALBUMIN 4.2 4.5 3.6    Recent Labs Lab 04/27/15 1612  LIPASE 17   CBC:  Recent Labs Lab 04/25/15 1846 04/27/15 1612 04/28/15 0501  WBC 7.3 10.5 8.5  NEUTROABS  4.5  --  3.9  HGB 15.0 15.9 13.5  HCT 44.2 45.6 40.4  MCV 94.4 93.1 94.8  PLT 203 224 190   Cardiac Enzymes:  Recent Labs Lab 04/25/15 1846  TROPONINI <0.03   Signed:  Orvan Falconer MD  FACP  [facility] 04/28/2015, 5:30 PM

## 2015-04-28 NOTE — Progress Notes (Signed)
Pt complained of having nausea - relieved with phenergen.Marland Kitchen  He is fully clothed in bed stated "I would rather leave my clothes on".

## 2015-04-28 NOTE — Progress Notes (Signed)
Pt IV removed, tolerated well.  Reviewed discharge instructions with pt and answered all questions at this time.  

## 2015-04-28 NOTE — Care Management Note (Signed)
Case Management Note  Patient Details  Name: [name] MRN: [id number] Date of Birth: May 25, 1980  Subjective/Objective:                  Pt admitted from home with nausea and vomiting. Pt lives alone and will return home at discharge. Pt is independent with ADL's.  Action/Plan: No CM needs noted.  Expected Discharge Date:                  Expected Discharge Plan:  Home/Self Care  In-House Referral:  NA  Discharge planning Services  CM Consult  Post Acute Care Choice:  NA Choice offered to:  NA  DME Arranged:    DME Agency:     HH Arranged:    HH Agency:     Status of Service:  Completed, signed off  Medicare Important Message Given:    Date Medicare IM Given:    Medicare IM give by:    Date Additional Medicare IM Given:    Additional Medicare Important Message give by:     If discussed at Long Length of Stay Meetings, dates discussed:    Additional Comments:  Cheryl Flash, RN 04/28/2015, 10:28 AM

## 2015-04-29 LAB — HEPATITIS PANEL, ACUTE
HEP B S AG: NEGATIVE
Hep A IgM: NEGATIVE
Hep B C IgM: NEGATIVE

## 2015-06-27 ENCOUNTER — Other Ambulatory Visit: Payer: Self-pay | Admitting: *Deleted

## 2015-06-27 ENCOUNTER — Ambulatory Visit (INDEPENDENT_AMBULATORY_CARE_PROVIDER_SITE_OTHER): Payer: Medicaid Other | Admitting: Orthopaedic Surgery

## 2015-06-27 ENCOUNTER — Encounter: Payer: Self-pay | Admitting: Orthopaedic Surgery

## 2015-06-27 VITALS — BP 145/77 | HR 87 | Temp 97.3°F | Ht 69.0 in | Wt 328.2 lb

## 2015-06-27 DIAGNOSIS — K2100 Gastro-esophageal reflux disease with esophagitis, without bleeding: Secondary | ICD-10-CM

## 2015-06-27 DIAGNOSIS — M25571 Pain in right ankle and joints of right foot: Secondary | ICD-10-CM

## 2015-06-27 DIAGNOSIS — K219 Gastro-esophageal reflux disease without esophagitis: Secondary | ICD-10-CM

## 2015-06-27 MED ORDER — HYDROCODONE-ACETAMINOPHEN 7.5-325 MG PO TABS: 1.0000 | ORAL_TABLET | ORAL | Status: DC | PRN

## 2015-06-27 NOTE — Progress Notes (Signed)
Patient [name], male DOB:[birth year], 35 y.o. QVZ:[id number]  Chief Complaint  Patient presents with  . Follow-up    Right ankle pain    HPI  [name] is a 35 y.o. male who has chronic right ankle pain. He has no new fracture no redness.  He has no change in shoe wear.  His pain is laterally.  He is taking his medicine.   Ankle Pain  The incident occurred more than 1 week ago. The pain is present in the right ankle. The quality of the pain is described as aching and cramping. The pain is at a severity of 4/10. The pain is moderate. The pain has been fluctuating since onset. Associated symptoms include an inability to bear weight and a loss of motion. Pertinent negatives include no loss of sensation, muscle weakness, numbness or tingling. The symptoms are aggravated by weight bearing. He has tried elevation, immobilization, ice, rest and NSAIDs for the symptoms. The treatment provided mild relief.    Body mass index is 48.44 kg/(m^2).  Review of Systems  Constitutional:       Patient does not have Diabetes Mellitus. Patient has hypertension. Patient does have COPD or shortness of breath. Patient has BMI > 35. Patient has current smoking history.   Respiratory: Positive for cough, shortness of breath and wheezing.        Sleep apnea  Gastrointestinal: Positive for nausea and vomiting.       GERD  Neurological: Negative for tingling and numbness.  Psychiatric/Behavioral: Positive for sleep disturbance. The patient is nervous/anxious.     Past Medical History  Diagnosis Date  . GERD (gastroesophageal reflux disease)     Bravo study Nov 2009 on Prilosec 40 BID with adequate acid suppression  . Hypertension   . Anxiety   . Depression     increased over past several months  . Chronic abdominal pain     HIDA scan Sept 2010, EF 93%, s/p chole oct 2010; evaluated at [facility] for abdominal wall pain; Imipramine added May 2011  . Chronic vomiting     normal GES 2009   . S/P endoscopy     2008 Dr. Karilyn Cota: erosive reflux esophagitis, antral gastritis, H.pylori serologies neg, Nov 2009 Dr. Darrick Penna: gastritis, benign esophageal polyp, no H.pylori,  Feb 2011: [facility], Dr. Bubba Hales: normal esophagus, normal stomach, normal duodenum, path unremarkable  . Ventral hernia   . Shortness of breath     SOB even with no exertion  . Sleep apnea     supposed to sleep with CPAP  . Hiatal hernia   . Bipolar 2 disorder [facility])     Past Surgical History  Procedure Laterality Date  . Ventral hernia repair  March 2012    Dr. Lovell Sheehan  . Knee surgery Right 2009  . Cholecystectomy  10/10  . Appendectomy  5/10  . Esophagogastroduodenoscopy  05/2006    H.pylori neg  . Inguinal hernia repair      child  . Esophagogastroduodenoscopy  03/23/2008    Fields-gastritis benign esophageal polyp, otherwise normal. Negative H. pylori he (propofol)  . Incisional hernia repair  03/04/2012    Procedure: LAPAROSCOPIC INCISIONAL HERNIA;  Surgeon: Dalia Heading, MD;  Location: [facility];  Service: General;  Laterality: N/A;  repair, recurrent  . Insertion of mesh  03/04/2012    Procedure: INSERTION OF MESH;  Surgeon: Dalia Heading, MD;  Location: [facility];  Service: General;  Laterality: N/A;  . Incisional hernia repair N/A 01/12/2014  Procedure: HERNIA REPAIR INCISIONAL WITH MESH;  Surgeon: Dalia Heading, MD;  Location: [facility];  Service: General;  Laterality: N/A;  . Insertion of mesh N/A 01/12/2014    Procedure: INSERTION OF MESH;  Surgeon: Dalia Heading, MD;  Location: [facility];  Service: General;  Laterality: N/A;    Family History  Problem Relation Age of Onset  . Colon cancer Neg Hx   . Diabetes Mother     Social History Social History  Substance Use Topics  . Smoking status: Current Every Day Smoker -- 1.00 packs/day for 15 years    Types: Cigarettes  . Smokeless tobacco: Never Used     Comment: Less than 1/2 pk a day. trying to quit.  03/04/12 1255 states "was doing good, but  now up to about a pack per day"  . Alcohol Use: No    Allergies  Allergen Reactions  . Dexilant [Dexlansoprazole] Other (See Comments)    Pass out  . Propoxyphene N-Acetaminophen Other (See Comments)    Unknown   . Zofran Odt [Ondansetron]     vomit  . Zofran [Ondansetron Hcl] Nausea And Vomiting    Current Outpatient Prescriptions  Medication Sig Dispense Refill  . ALPRAZolam (XANAX) 1 MG tablet Take 1 mg by mouth 4 (four) times daily.     Marland Kitchen lisinopril (PRINIVIL,ZESTRIL) 40 MG tablet Take 1 tablet (40 mg total) by mouth daily. 30 tablet 1  . omeprazole (PRILOSEC) 40 MG capsule Take 40 mg by mouth 2 (two) times daily.    Marland Kitchen topiramate (TOPAMAX) 100 MG tablet Take 100 mg by mouth 2 (two) times daily.    Marland Kitchen HYDROcodone-acetaminophen (NORCO) 7.5-325 MG tablet Take 1 tablet by mouth every 4 (four) hours as needed for moderate pain (Must last 30 days.  Do not drive or operate machinery while taking this medicine.). 120 tablet 0   No current facility-administered medications for this visit.     Physical Exam  Blood pressure 145/77, pulse 87, temperature 97.3 F (36.3 C), height 5\' 9"  (1.753 m), weight 328 lb 3.2 oz (148.871 kg).  Constitutional: overall normal hygiene, normal nutrition, well developed, normal grooming, normal body habitus. Assistive device:none  Musculoskeletal: gait and station Limp to the right, muscle tone and strength are normal, no tremors or atrophy is present.  .  Neurological: coordination overall normal.  Deep tendon reflex/nerve stretch intact.  Sensation normal.  Cranial nerves II-XII intact.   Skin:   normal overall no scars, lesions, ulcers or rashes. No psoriasis.  Psychiatric: Alert and oriented x 3.  Recent memory intact, remote memory unclear.  Normal mood and affect. Well groomed.  Good eye contact.  Cardiovascular: overall no swelling, no varicosities, no edema bilaterally, normal temperatures of the legs and arms, no clubbing, cyanosis and good  capillary refill.  Lymphatic: palpation is normal.   Extremities:his right ankle has swelling and pain and tenderness laterally.  He has pain over the anterior talofibular ligament.  Color is normal.  NV is intact.  The left ankle is normal.  The upper extremities are normal. Inspection swelling right ankle laterally, normal upper extremities and of the left ankle Strength and tone normal upper and lower extremities Range of motion slight decrease dorsiflexion and plantar flexion of the right ankle, normal of upper extremities of of the left ankle  Additional services performed: review of ER visit and then hospitalization in December at [facility]. I read the H&P and studies done.  He had elevated liver functions on  admission but they went to normal after hydration and before discharge.  I am concerned about his use of acetomorphine in his pain medicine and with the transient rise of liver functions.  I will have him see a gastroenterologist about this.  He has GERD rather significantly already.   The patient has been educated about the nature of the problem(s) and counseled on treatment options.  The patient appeared to understand what I have discussed and is in agreement with it.  PLAN Call if any problems.  Precautions discussed.  Continue current medications.   Return to clinic three months

## 2015-06-27 NOTE — Patient Instructions (Addendum)
Referral to gastro doctor

## 2015-06-28 ENCOUNTER — Ambulatory Visit: Payer: Medicaid Other | Admitting: Orthopaedic Surgery

## 2015-07-06 ENCOUNTER — Telehealth: Payer: Self-pay | Admitting: *Deleted

## 2015-07-06 NOTE — Telephone Encounter (Signed)
Appointment with Verlon Au at [facility] for 07/25/15 at 8:00 am. Patient aware.

## 2015-07-25 ENCOUNTER — Other Ambulatory Visit: Payer: Self-pay

## 2015-07-25 ENCOUNTER — Encounter: Payer: Self-pay | Admitting: Gastroenterology

## 2015-07-25 ENCOUNTER — Ambulatory Visit (INDEPENDENT_AMBULATORY_CARE_PROVIDER_SITE_OTHER): Payer: Medicaid Other | Admitting: Gastroenterology

## 2015-07-25 VITALS — BP 158/95 | HR 98 | Temp 97.6°F | Ht 68.0 in | Wt 322.2 lb

## 2015-07-25 DIAGNOSIS — R7401 Elevation of levels of liver transaminase levels: Secondary | ICD-10-CM

## 2015-07-25 DIAGNOSIS — K432 Incisional hernia without obstruction or gangrene: Secondary | ICD-10-CM

## 2015-07-25 DIAGNOSIS — K219 Gastro-esophageal reflux disease without esophagitis: Secondary | ICD-10-CM

## 2015-07-25 DIAGNOSIS — R6881 Early satiety: Secondary | ICD-10-CM | POA: Insufficient documentation

## 2015-07-25 DIAGNOSIS — R740 Nonspecific elevation of levels of transaminase and lactic acid dehydrogenase [LDH]: Secondary | ICD-10-CM | POA: Diagnosis not present

## 2015-07-25 DIAGNOSIS — R1013 Epigastric pain: Secondary | ICD-10-CM | POA: Diagnosis not present

## 2015-07-25 NOTE — Assessment & Plan Note (Addendum)
Elevation of transaminases, back in December when he presented with acute illness. Abdominal ultrasound at that time unremarkable. CT back in September 2016, liver unremarkable. He has gained about 50 pounds in the past 6 months. Cannot rule out underlying fatty liver, other etiologies need to be excluded as well. He does have a history of chronic intermittent hyperbilirubinemia. Repeat LFTs at this time along with first line labs are abnormal LFTs. There is concern about his acetaminophen use with his narcotics. As long as patient is taking his medication appropriately would not suspect toxicity related to acetaminophen use. Further recommendations to follow.

## 2015-07-25 NOTE — Patient Instructions (Signed)
Please have your lab work done.  Upper endoscopy as scheduled. Please see separate instructions.  Referral to Dr. Lovell Sheehan to recheck incisional hernia.  Gastroesophageal Reflux Disease, Adult Normally, food travels down the esophagus and stays in the stomach to be digested. However, when a person has gastroesophageal reflux disease (GERD), food and stomach acid move back up into the esophagus. When this happens, the esophagus becomes sore and inflamed. Over time, GERD can create small holes (ulcers) in the lining of the esophagus.  CAUSES This condition is caused by a problem with the muscle between the esophagus and the stomach (lower esophageal sphincter, or LES). Normally, the LES muscle closes after food passes through the esophagus to the stomach. When the LES is weakened or abnormal, it does not close properly, and that allows food and stomach acid to go back up into the esophagus. The LES can be weakened by certain dietary substances, medicines, and medical conditions, including:  Tobacco use.  Pregnancy.  Having a hiatal hernia.  Heavy alcohol use.  Certain foods and beverages, such as coffee, chocolate, onions, and peppermint. RISK FACTORS This condition is more likely to develop in:  People who have an increased body weight.  People who have connective tissue disorders.  People who use NSAID medicines. SYMPTOMS Symptoms of this condition include:  Heartburn.  Difficult or painful swallowing.  The feeling of having a lump in the throat.  Abitter taste in the mouth.  Bad breath.  Having a large amount of saliva.  Having an upset or bloated stomach.  Belching.  Chest pain.  Shortness of breath or wheezing.  Ongoing (chronic) cough or a night-time cough.  Wearing away of tooth enamel.  Weight loss. Different conditions can cause chest pain. Make sure to see your health care provider if you experience chest pain. DIAGNOSIS Your health care provider  will take a medical history and perform a physical exam. To determine if you have mild or severe GERD, your health care provider may also monitor how you respond to treatment. You may also have other tests, including:  An endoscopy toexamine your stomach and esophagus with a small camera.  A test thatmeasures the acidity level in your esophagus.  A test thatmeasures how much pressure is on your esophagus.  A barium swallow or modified barium swallow to show the shape, size, and functioning of your esophagus. TREATMENT The goal of treatment is to help relieve your symptoms and to prevent complications. Treatment for this condition may vary depending on how severe your symptoms are. Your health care provider may recommend:  Changes to your diet.  Medicine.  Surgery. HOME CARE INSTRUCTIONS Diet  Follow a diet as recommended by your health care provider. This may involve avoiding foods and drinks such as:  Coffee and tea (with or without caffeine).  Drinks that containalcohol.  Energy drinks and sports drinks.  Carbonated drinks or sodas.  Chocolate and cocoa.  Peppermint and mint flavorings.  Garlic and onions.  Horseradish.  Spicy and acidic foods, including peppers, chili powder, curry powder, vinegar, hot sauces, and barbecue sauce.  Citrus fruit juices and citrus fruits, such as oranges, lemons, and limes.  Tomato-based foods, such as red sauce, chili, salsa, and pizza with red sauce.  Fried and fatty foods, such as donuts, french fries, potato chips, and high-fat dressings.  High-fat meats, such as hot dogs and fatty cuts of red and white meats, such as rib eye steak, sausage, ham, and bacon.  High-fat dairy items,  such as whole milk, butter, and cream cheese.  Eat small, frequent meals instead of large meals.  Avoid drinking large amounts of liquid with your meals.  Avoid eating meals during the 2-3 hours before bedtime.  Avoid lying down right after you  eat.  Do not exercise right after you eat. General Instructions  Pay attention to any changes in your symptoms.  Take over-the-counter and prescription medicines only as told by your health care provider. Do not take aspirin, ibuprofen, or other NSAIDs unless your health care provider told you to do so.  Do not use any tobacco products, including cigarettes, chewing tobacco, and e-cigarettes. If you need help quitting, ask your health care provider.  Wear loose-fitting clothing. Do not wear anything tight around your waist that causes pressure on your abdomen.  Raise (elevate) the head of your bed 6 inches (15cm).  Try to reduce your stress, such as with yoga or meditation. If you need help reducing stress, ask your health care provider.  If you are overweight, reduce your weight to an amount that is healthy for you. Ask your health care provider for guidance about a safe weight loss goal.  Keep all follow-up visits as told by your health care provider. This is important. SEEK MEDICAL CARE IF:  You have new symptoms.  You have unexplained weight loss.  You have difficulty swallowing, or it hurts to swallow.  You have wheezing or a persistent cough.  Your symptoms do not improve with treatment.  You have a hoarse voice. SEEK IMMEDIATE MEDICAL CARE IF:  You have pain in your arms, neck, jaw, teeth, or back.  You feel sweaty, dizzy, or light-headed.  You have chest pain or shortness of breath.  You vomit and your vomit looks like blood or coffee grounds.  You faint.  Your stool is bloody or black.  You cannot swallow, drink, or eat.   This information is not intended to replace advice given to you by your health care provider. Make sure you discuss any questions you have with your health care provider.   Document Released: 01/30/2005 Document Revised: 01/11/2015 Document Reviewed: 08/17/2014 Elsevier Interactive Patient Education [employer]

## 2015-07-25 NOTE — Assessment & Plan Note (Addendum)
35 year old gentleman with chronic GERD, chronic intermittent epigastric pain, bump in LFTs as outlined above several months ago. For the most part typical heartburn is well-controlled on omeprazole 40 mg twice daily. He states he does not want to change this regimen because it seems to work fairly well for him. He does however have intermittent nausea, vomiting, ongoing epigastric discomfort, new symptom of early satiety, abdominal swelling. Significant weight gain as outlined. Significant chronic NSAID use. Cannot exclude delayed gastric emptying in the setting of chronic narcotics, gastritis, peptic ulcer disease. Recommend upper endoscopy for further evaluation, deep sedation in the OR given polypharmacy.  I have discussed the risks, alternatives, benefits with regards to but not limited to the risk of reaction to medication, bleeding, infection, perforation and the patient is agreeable to proceed. Written consent to be obtained.  Patient complains of possible recurrent incisional hernia.Marland Kitchen Umbilical hernias noted on CT. He has tenderness with palpation at site of his most recent incisional hernia repair with slight bulging. He will follow-up with Dr. Lovell Sheehan.

## 2015-07-25 NOTE — Progress Notes (Signed)
Primary Care Physician:  Avon Gully, MD  Primary Gastroenterologist:  Jonette Eva, MD   Chief Complaint  Patient presents with  . Gastroesophageal Reflux  . Elevated Hepatic Enzymes    HPI:  [name] is a 35 y.o. male here at the request of Dr. Darreld Mclean for further evaluation of gastric soft reflux disease. Patient also has a history of abnormal LFTs.  Patient was hospitalized back in December with nausea, vomiting, diarrhea, back pain. During the hospitalization he was noted to have mildly elevated LFTs. Abdominal ultrasound was unremarkable. Viral markers were negative for hepatitis B and C. Outpatient referral was initiated at that time that we never saw the patient. Recently patient saw Dr. Hilda Lias in follow-up. Dr. Hilda Lias noted that his LFTs were elevated back in December and had concerns about ongoing acetaminophen use in his pain medications. He also wanted the patient to be evaluated for refractory GERD.  During hospitalization his AST was 210, ALT 157, total bilirubin 0.8 on admission. At time of discharge his AST was 53, ALT 84, total bilirubin 1.7.   Patient complains of poor appetite. He states he eats once a day. Complains of early satiety. Worried about weight gain. Weight is up 50 pounds in the past 6 months. Tells me that his back pain, vomiting and diarrhea resolved after the hospitalization. He continues to have frequent regurgitation but denies heartburn. Does not want to switch her omeprazole because it seems to be working for the heartburn. Bowel movements are regular. No melena rectal bleeding. He complains of frequent abdominal swelling. States his pants fit from day-to-day. May change a couple sizes overnight. Takes ibuprofen 800 mg every 4-6 hours, Vicodin every 4-6 hours. Denies dysphagia. Complains of epigastric pain.    PCP getting labs done today.   Previously evaluated at [facility] 2014.  Current Outpatient Prescriptions  Medication Sig Dispense  Refill  . ALPRAZolam (XANAX) 1 MG tablet Take 1 mg by mouth 4 (four) times daily.     Marland Kitchen HYDROcodone-acetaminophen (NORCO) 7.5-325 MG tablet Take 1 tablet by mouth every 4 (four) hours.    Marland Kitchen ibuprofen (ADVIL,MOTRIN) 800 MG tablet Take 800 mg by mouth every 6 (six) hours.    Marland Kitchen lisinopril (PRINIVIL,ZESTRIL) 40 MG tablet Take 1 tablet (40 mg total) by mouth daily. 30 tablet 1  . omeprazole (PRILOSEC) 40 MG capsule Take 40 mg by mouth 2 (two) times daily.    Marland Kitchen topiramate (TOPAMAX) 100 MG tablet Take 100 mg by mouth 2 (two) times daily.     No current facility-administered medications for this visit.    Allergies as of 07/25/2015 - Review Complete 07/25/2015  Allergen Reaction Noted  . Dexilant [dexlansoprazole] Other (See Comments) 01/09/2012  . Propoxyphene n-acetaminophen Other (See Comments)   . Zofran odt [ondansetron]  03/08/2012  . Zofran [ondansetron hcl] Nausea And Vomiting 01/21/2012    Past Medical History  Diagnosis Date  . GERD (gastroesophageal reflux disease)     Bravo study Nov 2009 on Prilosec 40 BID with adequate acid suppression  . Hypertension   . Anxiety   . Depression     increased over past several months  . Chronic abdominal pain     HIDA scan Sept 2010, EF 93%, s/p chole oct 2010; evaluated at [facility] for abdominal wall pain; Imipramine added May 2011  . Chronic vomiting     normal GES 2009  . S/P endoscopy     2008 Dr. Karilyn Cota: erosive reflux esophagitis, antral gastritis, H.pylori serologies neg,  Nov 2009 Dr. Darrick Penna: gastritis, benign esophageal polyp, no H.pylori,  Feb 2011: [facility], Dr. Bubba Hales: normal esophagus, normal stomach, normal duodenum, path unremarkable  . Ventral hernia   . Shortness of breath     SOB even with no exertion  . Sleep apnea     supposed to sleep with CPAP  . Hiatal hernia   . Bipolar 2 disorder [facility])     Past Surgical History  Procedure Laterality Date  . Ventral hernia repair  March 2012    Dr. Lovell Sheehan  . Knee surgery Right  2009  . Cholecystectomy  10/10  . Appendectomy  5/10  . Esophagogastroduodenoscopy  05/2006    H.pylori neg  . Inguinal hernia repair      child  . Esophagogastroduodenoscopy  03/23/2008    Fields-gastritis benign esophageal polyp, otherwise normal. Negative H. pylori he (propofol)  . Incisional hernia repair  03/04/2012    Procedure: LAPAROSCOPIC INCISIONAL HERNIA;  Surgeon: Dalia Heading, MD;  Location: [facility];  Service: General;  Laterality: N/A;  repair, recurrent  . Insertion of mesh  03/04/2012    Procedure: INSERTION OF MESH;  Surgeon: Dalia Heading, MD;  Location: [facility];  Service: General;  Laterality: N/A;  . Incisional hernia repair N/A 01/12/2014    Procedure: HERNIA REPAIR INCISIONAL WITH MESH;  Surgeon: Dalia Heading, MD;  Location: [facility];  Service: General;  Laterality: N/A;  . Insertion of mesh N/A 01/12/2014    Procedure: INSERTION OF MESH;  Surgeon: Dalia Heading, MD;  Location: [facility];  Service: General;  Laterality: N/A;    Family History  Problem Relation Age of Onset  . Colon cancer Neg Hx   . Diabetes Mother     Social History   Social History  . Marital Status: Single    Spouse Name: N/A  . Number of Children: 1  . Years of Education: N/A   Occupational History  . disabled     applying for disability, depression  .     Social History Main Topics  . Smoking status: Current Every Day Smoker -- 1.00 packs/day for 15 years    Types: Cigarettes  . Smokeless tobacco: Never Used     Comment: Less than 1/2 pk a day. trying to quit.  03/04/12 1255 states "was doing good, but now up to about a pack per day"  . Alcohol Use: No  . Drug Use: No  . Sexual Activity: No   Other Topics Concern  . Not on file   Social History Narrative   Lives alone      ROS:  General: Negative for anorexia, weight loss, fever, chills, fatigue, weakness. Eyes: Negative for vision changes.  ENT: Negative for hoarseness, difficulty swallowing , nasal congestion. CV:  Negative for chest pain, angina, palpitations, dyspnea on exertion, peripheral edema.  Respiratory: Negative for dyspnea at rest, dyspnea on exertion, cough, sputum, wheezing.  GI: See history of present illness. GU:  Negative for dysuria, hematuria, urinary incontinence, urinary frequency, nocturnal urination.  MS: Negative for joint pain, low back pain.  Derm: Negative for rash or itching.  Neuro: Negative for weakness, abnormal sensation, seizure, frequent headaches, memory loss, confusion.  Psych: Negative for anxiety, depression, suicidal ideation, hallucinations.  Endo: Negative for unusual weight change.  Heme: Negative for bruising or bleeding. Allergy: Negative for rash or hives.    Physical Examination:  BP 158/95 mmHg  Pulse 98  Temp(Src) 97.6 F (36.4 C) (Oral)  Ht 5\' 8"  (  1.727 m)  Wt 322 lb 3.2 oz (146.149 kg)  BMI 49.00 kg/m2   General: Well-nourished, well-developed in no acute distress.  Head: Normocephalic, atraumatic.   Eyes: Conjunctiva pink, no icterus. Mouth: Oropharyngeal mucosa moist and pink , no lesions erythema or exudate. Neck: Supple without thyromegaly, masses, or lymphadenopathy.  Lungs: Clear to auscultation bilaterally.  Heart: Regular rate and rhythm, no murmurs rubs or gallops.  Abdomen: Bowel sounds are normal, epigastric tenderness, tenderness noted at site of previous incisional hernia with questionable small defect. Exam limited by body habitus. hepatosplenomegaly or masses, no abdominal bruits or    hernia , no rebound or guarding.   Rectal: Not performed Extremities: No lower extremity edema. No clubbing or deformities.  Neuro: Alert and oriented x 4 , grossly normal neurologically.  Skin: Warm and dry, no rash or jaundice.   Psych: Alert and cooperative, normal mood and affect.  Labs: Lab Results  Component Value Date   CREATININE 1.07 04/28/2015   BUN 18 04/28/2015   NA 138 04/28/2015   K 3.6 04/28/2015   CL 104 04/28/2015   CO2  27 04/28/2015   Lab Results  Component Value Date   ALT 84* 04/28/2015   AST 53* 04/28/2015   ALKPHOS 60 04/28/2015   BILITOT 1.7* 04/28/2015   Lab Results  Component Value Date   HEPAIGM Negative 04/27/2015   HEPBIGM Negative 04/27/2015   Lab Results  Component Value Date   LIPASE 17 04/27/2015   Lab Results  Component Value Date   WBC 8.5 04/28/2015   HGB 13.5 04/28/2015   HCT 40.4 04/28/2015   MCV 94.8 04/28/2015   PLT 190 04/28/2015   Hep C Ab negative  Imaging Studies: No results found.  abd u/s 04/2015: unremarkable  CT of the abdomen and pelvis with contrast in September 2016, liver unremarkable. Small periumbilical hernias containing fat. Mild anterior right pelvic subcutaneous inflammation likely representing contusion changes. 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

## 2015-07-26 NOTE — Progress Notes (Signed)
CC'D TO PCP °

## 2015-07-27 ENCOUNTER — Telehealth: Payer: Self-pay | Admitting: Orthopaedic Surgery

## 2015-07-27 MED ORDER — HYDROCODONE-ACETAMINOPHEN 7.5-325 MG PO TABS: 1.0000 | ORAL_TABLET | ORAL | Status: DC | PRN

## 2015-07-27 NOTE — Telephone Encounter (Signed)
Patient requesting refill of Norco 7.5/325mg    Qty 120 Tablets

## 2015-07-27 NOTE — Telephone Encounter (Signed)
Rx done. 

## 2015-08-04 ENCOUNTER — Encounter (HOSPITAL_COMMUNITY): Payer: Self-pay

## 2015-08-09 NOTE — Patient Instructions (Addendum)
[name]  08/09/2015     @   Your procedure is scheduled on  08/15/2015   Report to [name] at  645  A.M.  Call this number if you have problems the morning of surgery:  [phone]   Remember:  Do not eat food or drink liquids after midnight.  Take these medicines the morning of surgery with A SIP OF WATER  Xanax, hydrocodone, lisinopril, prilosec, topamax.   Do not wear jewelry, make-up or nail polish.  Do not wear lotions, powders, or perfumes.  You may wear deodorant.  Do not shave 48 hours prior to surgery.  Men may shave face and neck.  Do not bring valuables to the hospital.  [facility] is not responsible for any belongings or valuables.  Contacts, dentures or bridgework may not be worn into surgery.  Leave your suitcase in the car.  After surgery it may be brought to your room.  For patients admitted to the hospital, discharge time will be determined by your treatment team.  Patients discharged the day of surgery will not be allowed to drive home.   Name and phone number of your driver:   family Special instructions:  Follow the diet instructions given to you by Dr Evelina Dun office.  Please read over the following fact sheets that you were given. Coughing and Deep Breathing, Surgical Site Infection Prevention, Anesthesia Post-op Instructions and Care and Recovery After Surgery      Esophagogastroduodenoscopy Esophagogastroduodenoscopy (EGD) is a procedure that is used to examine the lining of the esophagus, stomach, and first part of the small intestine (duodenum). A long, flexible, lighted tube with a camera attached (endoscope) is inserted down the throat to view these organs. This procedure is done to detect problems or abnormalities, such as inflammation, bleeding, ulcers, or growths, in order to treat them. The procedure lasts 5-20 minutes. It is usually an outpatient procedure, but it may need to be performed in a hospital  in emergency cases. LET [name] CARE PROVIDER KNOW ABOUT:  Any allergies you have.  All medicines you are taking, including vitamins, herbs, eye drops, creams, and over-the-counter medicines.  Previous problems you or members of your family have had with the use of anesthetics.  Any blood disorders you have.  Previous surgeries you have had.  Medical conditions you have. RISKS AND COMPLICATIONS Generally, this is a safe procedure. However, problems can occur and include:  Infection.  Bleeding.  Tearing (perforation) of the esophagus, stomach, or duodenum.  Difficulty breathing or not being able to breathe.  Excessive sweating.  Spasms of the larynx.  Slowed heartbeat.  Low blood pressure. BEFORE THE PROCEDURE  Do not eat or drink anything after midnight on the night before the procedure or as directed by your health care provider.  Do not take your regular medicines before the procedure if your health care provider asks you not to. Ask your health care provider about changing or stopping those medicines.  If you wear dentures, be prepared to remove them before the procedure.  Arrange for someone to drive you home after the procedure. PROCEDURE  A numbing medicine (local anesthetic) may be sprayed in your throat for comfort and to stop you from gagging or coughing.  You will have an IV tube inserted in a vein in your hand or arm. You will receive medicines and fluids through this tube.  YCREWE HEATHMANiven a medicine to relax  you (sedative).  A pain reliever will be given through the IV tube.  A mouth guard may be placed in your mouth to protect your teeth and to keep you from biting on the endoscope.  You will be asked to lie on your left side.  The endoscope will be inserted down your throat and into your esophagus, stomach, and duodenum.  Air will be put through the endoscope to allow your health care provider to clearly view the lining of your  esophagus.  The lining of your esophagus, stomach, and duodenum will be examined. During the exam, your health care provider may:  Remove tissue to be examined under a microscope (biopsy) for inflammation, infection, or other medical problems.  Remove growths.  Remove objects (foreign bodies) that are stuck.  Treat any bleeding with medicines or other devices that stop tissues from bleeding (hot cautery, clipping devices).  Widen (dilate) or stretch narrowed areas of your esophagus and stomach.  The endoscope will be withdrawn. AFTER THE PROCEDURE  You will be taken to a recovery area for observation. Your blood pressure, heart rate, breathing rate, and blood oxygen level will be monitored often until the medicines you were given have worn off.  Do not eat or drink anything until the numbing medicine has worn off and your gag reflex has returned. You may choke.  Your health care provider should be able to discuss his or her findings with you. It will take longer to discuss the test results if any biopsies were taken.   This information is not intended to replace advice given to you by your health care provider. Make sure you discuss any questions you have with your health care provider.   Document Released: 08/23/2004 Document Revised: 05/13/2014 Document Reviewed: 03/25/2012 Elsevier Interactive Patient Education 2016 [place]. Esophagogastroduodenoscopy, Care After Refer to this sheet in the next few weeks. These instructions provide you with information about caring for yourself after your procedure. Your health care provider may also give you more specific instructions. Your treatment has been planned according to current medical practices, but problems sometimes occur. Call your health care provider if you have any problems or questions after your procedure. WHAT TO EXPECT AFTER THE PROCEDURE After your procedure, it is typical to feel:  Soreness in your throat.  Pain with  swallowing.  Sick to your stomach (nauseous).  Bloated.  Dizzy.  Fatigued. HOME CARE INSTRUCTIONS  Do not eat or drink anything until the numbing medicine (local anesthetic) has worn off and your gag reflex has returned. You will know that the local anesthetic has worn off when you can swallow comfortably.  Do not drive or operate machinery until directed by your health care provider.  Take medicines only as directed by your health care provider. SEEK MEDICAL CARE IF:   You cannot stop coughing.  You are not urinating at all or less than usual. SEEK IMMEDIATE MEDICAL CARE IF:  You have difficulty swallowing.  You cannot eat or drink.  You have worsening throat or chest pain.  You have dizziness or lightheadedness or you faint.  You have nausea or vomiting.  You have chills.  You have a fever.  You have severe abdominal pain.  You have black, tarry, or bloody stools.   This information is not intended to replace advice given to you by your health care provider. Make sure you discuss any questions you have with your health care provider.   Document Released: 04/08/2012 Document Revised: 05/13/2014 Document Reviewed:  04/08/2012 Elsevier Interactive Patient Education 2016 [employer] PATIENT INSTRUCTIONS POST-ANESTHESIA  IMMEDIATELY FOLLOWING SURGERY:  Do not drive or operate machinery for the first twenty four hours after surgery.  Do not make any important decisions for twenty four hours after surgery or while taking narcotic pain medications or sedatives.  If you develop intractable nausea and vomiting or a severe headache please notify your doctor immediately.  FOLLOW-UP:  Please make an appointment with your surgeon as instructed. You do not need to follow up with anesthesia unless specifically instructed to do so.  WOUND CARE INSTRUCTIONS (if applicable):  Keep a dry clean dressing on the anesthesia/puncture wound site if there is drainage.  Once the wound has  quit draining you may leave it open to air.  Generally you should leave the bandage intact for twenty four hours unless there is drainage.  If the epidural site drains for more than 36-48 hours please call the anesthesia department.  QUESTIONS?:  Please feel free to call your physician or the hospital operator if you have any questions, and they will be happy to assist you.

## 2015-08-11 ENCOUNTER — Encounter (HOSPITAL_COMMUNITY)
Admission: RE | Admit: 2015-08-11 | Discharge: 2015-08-11 | Disposition: A | Discharge status: Home / Self Care | Payer: Medicaid Other | Source: Ambulatory Visit | Attending: Gastroenterology | Admitting: Gastroenterology

## 2015-08-11 ENCOUNTER — Other Ambulatory Visit: Payer: Self-pay

## 2015-08-11 ENCOUNTER — Encounter (HOSPITAL_COMMUNITY): Payer: Self-pay

## 2015-08-11 DIAGNOSIS — R945 Abnormal results of liver function studies: Secondary | ICD-10-CM | POA: Diagnosis present

## 2015-08-11 DIAGNOSIS — R1013 Epigastric pain: Secondary | ICD-10-CM | POA: Diagnosis present

## 2015-08-11 DIAGNOSIS — R635 Abnormal weight gain: Secondary | ICD-10-CM | POA: Insufficient documentation

## 2015-08-11 DIAGNOSIS — Z6841 Body Mass Index (BMI) 40.0 and over, adult: Secondary | ICD-10-CM | POA: Insufficient documentation

## 2015-08-11 LAB — CBC
HCT: 41.6 % (ref 39.0–52.0)
HEMOGLOBIN: 14.1 g/dL (ref 13.0–17.0)
MCH: 31.8 pg (ref 26.0–34.0)
MCHC: 33.9 g/dL (ref 30.0–36.0)
MCV: 93.9 fL (ref 78.0–100.0)
Platelets: 228 10*3/uL (ref 150–400)
RBC: 4.43 MIL/uL (ref 4.22–5.81)
RDW: 12.8 % (ref 11.5–15.5)
WBC: 10.2 10*3/uL (ref 4.0–10.5)

## 2015-08-11 LAB — BASIC METABOLIC PANEL
Anion gap: 8 (ref 5–15)
BUN: 15 mg/dL (ref 6–20)
CALCIUM: 8.4 mg/dL — AB (ref 8.9–10.3)
CO2: 25 mmol/L (ref 22–32)
CREATININE: 0.99 mg/dL (ref 0.61–1.24)
Chloride: 104 mmol/L (ref 101–111)
GFR calc non Af Amer: >60 mL/min (ref >60)
Glucose, Bld: 106 mg/dL — ABNORMAL HIGH (ref 65–99)
Potassium: 3.7 mmol/L (ref 3.5–5.1)
SODIUM: 137 mmol/L (ref 135–145)

## 2015-08-15 ENCOUNTER — Ambulatory Visit (HOSPITAL_COMMUNITY): Payer: Medicaid Other | Admitting: Anesthesiology

## 2015-08-15 ENCOUNTER — Other Ambulatory Visit: Payer: Self-pay

## 2015-08-15 ENCOUNTER — Telehealth: Payer: Self-pay | Admitting: Gastroenterology

## 2015-08-15 ENCOUNTER — Encounter (HOSPITAL_COMMUNITY)
Admission: RE | Disposition: A | Discharge status: Home / Self Care | Payer: Self-pay | Source: Ambulatory Visit | Attending: Gastroenterology

## 2015-08-15 ENCOUNTER — Ambulatory Visit (HOSPITAL_COMMUNITY)
Admission: RE | Admit: 2015-08-15 | Discharge: 2015-08-15 | Disposition: A | Discharge status: Home / Self Care | Payer: Medicaid Other | Source: Ambulatory Visit | Attending: Gastroenterology | Admitting: Gastroenterology

## 2015-08-15 ENCOUNTER — Encounter (HOSPITAL_COMMUNITY): Payer: Self-pay | Admitting: *Deleted

## 2015-08-15 DIAGNOSIS — F419 Anxiety disorder, unspecified: Secondary | ICD-10-CM | POA: Insufficient documentation

## 2015-08-15 DIAGNOSIS — K449 Diaphragmatic hernia without obstruction or gangrene: Secondary | ICD-10-CM | POA: Insufficient documentation

## 2015-08-15 DIAGNOSIS — F3181 Bipolar II disorder: Secondary | ICD-10-CM | POA: Insufficient documentation

## 2015-08-15 DIAGNOSIS — K319 Disease of stomach and duodenum, unspecified: Secondary | ICD-10-CM | POA: Insufficient documentation

## 2015-08-15 DIAGNOSIS — R1011 Right upper quadrant pain: Secondary | ICD-10-CM | POA: Diagnosis present

## 2015-08-15 DIAGNOSIS — G4730 Sleep apnea, unspecified: Secondary | ICD-10-CM | POA: Insufficient documentation

## 2015-08-15 DIAGNOSIS — R748 Abnormal levels of other serum enzymes: Secondary | ICD-10-CM | POA: Diagnosis not present

## 2015-08-15 DIAGNOSIS — F17210 Nicotine dependence, cigarettes, uncomplicated: Secondary | ICD-10-CM | POA: Diagnosis not present

## 2015-08-15 DIAGNOSIS — R112 Nausea with vomiting, unspecified: Secondary | ICD-10-CM | POA: Insufficient documentation

## 2015-08-15 DIAGNOSIS — Z6841 Body Mass Index (BMI) 40.0 and over, adult: Secondary | ICD-10-CM | POA: Diagnosis not present

## 2015-08-15 DIAGNOSIS — K2970 Gastritis, unspecified, without bleeding: Secondary | ICD-10-CM | POA: Insufficient documentation

## 2015-08-15 DIAGNOSIS — Z79899 Other long term (current) drug therapy: Secondary | ICD-10-CM | POA: Insufficient documentation

## 2015-08-15 DIAGNOSIS — I10 Essential (primary) hypertension: Secondary | ICD-10-CM | POA: Insufficient documentation

## 2015-08-15 DIAGNOSIS — F329 Major depressive disorder, single episode, unspecified: Secondary | ICD-10-CM | POA: Diagnosis not present

## 2015-08-15 HISTORY — PX: BIOPSY: SHX5522

## 2015-08-15 HISTORY — PX: ESOPHAGOGASTRODUODENOSCOPY (EGD) WITH PROPOFOL: SHX5813

## 2015-08-15 SURGERY — ESOPHAGOGASTRODUODENOSCOPY (EGD) WITH PROPOFOL
Anesthesia: Monitor Anesthesia Care

## 2015-08-15 MED ORDER — LIDOCAINE HCL (PF) 1 % IJ SOLN
INTRAMUSCULAR | Status: AC
  Filled 2015-08-15: qty 5

## 2015-08-15 MED ORDER — MIDAZOLAM HCL 2 MG/2ML IJ SOLN
INTRAMUSCULAR | Status: AC
  Filled 2015-08-15: qty 2

## 2015-08-15 MED ORDER — PROPOFOL 500 MG/50ML IV EMUL
INTRAVENOUS | Status: DC | PRN
  Administered 2015-08-15: 100 ug/kg/min via INTRAVENOUS
  Administered 2015-08-15: 09:00:00 via INTRAVENOUS

## 2015-08-15 MED ORDER — PROPOFOL 10 MG/ML IV BOLUS
INTRAVENOUS | Status: AC
  Filled 2015-08-15: qty 20

## 2015-08-15 MED ORDER — FENTANYL CITRATE (PF) 100 MCG/2ML IJ SOLN: 25.0000 ug | INTRAMUSCULAR | Status: DC | PRN

## 2015-08-15 MED ORDER — LIDOCAINE HCL (CARDIAC) 10 MG/ML IV SOLN
INTRAVENOUS | Status: DC | PRN
Start: 2015-08-15 — End: 2015-08-15
  Administered 2015-08-15: 50 mg via INTRAVENOUS

## 2015-08-15 MED ORDER — LIDOCAINE VISCOUS 2 % MT SOLN
OROMUCOSAL | Status: AC
  Filled 2015-08-15: qty 15

## 2015-08-15 MED ORDER — GLYCOPYRROLATE 0.2 MG/ML IJ SOLN
INTRAMUSCULAR | Status: AC
  Filled 2015-08-15: qty 1

## 2015-08-15 MED ORDER — MIDAZOLAM HCL 2 MG/2ML IJ SOLN
1.0000 mg | INTRAMUSCULAR | Status: DC | PRN
  Administered 2015-08-15: 2 mg via INTRAVENOUS

## 2015-08-15 MED ORDER — LACTATED RINGERS IV SOLN
INTRAVENOUS | Status: DC
  Administered 2015-08-15: 08:00:00 via INTRAVENOUS

## 2015-08-15 MED ORDER — FENTANYL CITRATE (PF) 100 MCG/2ML IJ SOLN
25.0000 ug | INTRAMUSCULAR | Status: AC
  Administered 2015-08-15 (×2): 25 ug via INTRAVENOUS

## 2015-08-15 MED ORDER — LACTATED RINGERS IV SOLN
INTRAVENOUS | Status: DC | PRN
  Administered 2015-08-15: 08:00:00 via INTRAVENOUS

## 2015-08-15 MED ORDER — MIDAZOLAM HCL 5 MG/5ML IJ SOLN
INTRAMUSCULAR | Status: DC | PRN
  Administered 2015-08-15: 2 mg via INTRAVENOUS

## 2015-08-15 MED ORDER — GLYCOPYRROLATE 0.2 MG/ML IJ SOLN
0.2000 mg | Freq: Once | INTRAMUSCULAR | Status: AC
Start: 2015-08-15 — End: 2015-08-15
  Administered 2015-08-15: 0.2 mg via INTRAVENOUS

## 2015-08-15 MED ORDER — FENTANYL CITRATE (PF) 100 MCG/2ML IJ SOLN
INTRAMUSCULAR | Status: AC
  Filled 2015-08-15: qty 2

## 2015-08-15 MED ORDER — ONDANSETRON HCL 4 MG/2ML IJ SOLN: 4.0000 mg | Freq: Once | INTRAMUSCULAR | Status: DC | PRN

## 2015-08-15 MED ORDER — LIDOCAINE VISCOUS 2 % MT SOLN
5.0000 mL | OROMUCOSAL | Status: AC
  Administered 2015-08-15 (×2): 5 mL via OROMUCOSAL

## 2015-08-15 NOTE — Discharge Instructions (Signed)
YOUR NAUSEA AND VOMITING IS DUE TO REFLUX AND GASTRITIS. YOUR ELEVATED LIVER ENZYMES IS DUE TO FAT IN YOUR LIVER.  You have gastritis MOST LIKELY DUE TO IBUPROFEN. YOU HAVE A SMALL HIATAL HERNIA. I biopsied your stomach.   CONTINUE YOUR WEIGHT LOSS EFFORTS. YOU SHOULD LOSE 20 POUNDS.   FOLLOW A LOW FAT DIET. MEATS SHOULD BE BAKED, BROILED, OR BOILED. AVOID FRIED FOODS. SEE INFO BELOW.  SEE [facility] for lap band and continue with NUTRITION RECOMMENDATION FOR A LOW FAT DIET AND FOR CALORIE MANAGEMENT.  TAKE OMEPRAZOLE 30 MINUTES PRIOR TO MEALS TWICE DAILY.  AVOID TRIGGERS FOR REFLUX SEE INFO BELOW.  FOLLOW UP IN 4 MOS.  UPPER ENDOSCOPY AFTER CARE Read the instructions outlined below and refer to this sheet in the next week. These discharge instructions provide you with general information on caring for yourself after you leave the hospital. While your treatment has been planned according to the most current medical practices available, unavoidable complications occasionally occur. If you have any problems or questions after discharge, call DR. Muhanad Torosyan, [phone].  ACTIVITY You may resume your regular activity, but move at a slower pace for the next 24 hours.  Take frequent rest periods for the next 24 hours.  Walking will help get rid of the air and reduce the bloated feeling in your belly (abdomen).  No driving for 24 hours (because of the medicine (anesthesia) used during the test).  You may shower.  Do not sign any important legal documents or operate any machinery for 24 hours (because of the anesthesia used during the test).   NUTRITION Drink plenty of fluids.  You may resume your normal diet as instructed by your doctor.  Begin with a light meal and progress to your normal diet. Heavy or fried foods are harder to digest and may make you feel sick to your stomach (nauseated).  Avoid alcoholic beverages for 24 hours or as instructed.   MEDICATIONS You may resume your normal  medications.  WHAT YOU CAN EXPECT TODAY Some feelings of bloating in the abdomen.  Passage of more gas than usual.   IF YOU HAD A BIOPSY TAKEN DURING THE UPPER ENDOSCOPY: Eat a soft diet IF YOU HAVE NAUSEA, BLOATING, ABDOMINAL PAIN, OR VOMITING.   FINDING OUT THE RESULTS OF YOUR TEST Not all test results are available during your visit. DR. Darrick Penna WILL CALL YOU WITHIN 14 DAYS OF YOUR PROCEDUE WITH YOUR RESULTS. Do not assume everything is normal if you have not heard from DR. Dondrell Loudermilk, CALL HER OFFICE AT [phone].  SEEK IMMEDIATE MEDICAL ATTENTION AND CALL THE OFFICE: [phone] IF: You have more than a spotting of blood in your stool.  Your belly is swollen (abdominal distention).  You are nauseated or vomiting.  You have a temperature over 101F.  You have abdominal pain or discomfort that is severe or gets worse throughout the day.   Lifestyle and home remedies TO CONTROL REFLUX  You may eliminate or reduce the frequency of heartburn by making the following lifestyle changes:  Control your weight. Being overweight is a major risk factor for heartburn and GERD. Excess pounds put pressure on your abdomen, pushing up your stomach and causing acid to back up into your esophagus.   Eat smaller meals. 4 TO 6 MEALS A DAY. This reduces pressure on the lower esophageal sphincter, helping to prevent the valve from opening and acid from washing back into your esophagus.   Loosen your belt. Clothes that fit tightly around your  waist put pressure on your abdomen and the lower esophageal sphincter.   Eliminate heartburn triggers. Everyone has specific triggers. Common triggers such as fatty or fried foods, spicy food, tomato sauce, carbonated beverages, alcohol, chocolate, mint, garlic, onion, caffeine and nicotine may make heartburn worse.   Avoid stooping or bending FOR AT LEAST ONE HOUR AFTER EATING. Tying your shoes is OK. Bending over for longer periods to weed your garden isn't,  especially soon after eating.   Don't lie down after a meal. Wait at least three to four hours after eating before going to bed, and don't lie down right after eating.    Alternative medicine Several home remedies exist for treating GERD, but they provide only temporary relief. They include drinking baking soda (sodium bicarbonate) added to water or drinking other fluids such as baking soda mixed with cream of tartar and water.  Although these liquids create temporary relief by neutralizing, washing away or buffering acids, eventually they aggravate the situation by adding gas and fluid to your stomach, increasing pressure and causing more acid reflux. Further, adding more sodium to your diet may increase your blood pressure and add stress to your heart, and excessive bicarbonate ingestion can alter the acid-base balance in your body.   Low-Fat Diet  BREADS, CEREALS, PASTA, RICE, DRIED PEAS, AND BEANS These products are high in carbohydrates and most are low in fat. Therefore, they can be increased in the diet as substitutes for fatty foods. They too, however, contain calories and should not be eaten in excess. Cereals can be eaten for snacks as well as for breakfast.  Include foods that contain fiber (fruits, vegetables, whole grains, and legumes). Research shows that fiber may lower blood cholesterol levels, especially the water-soluble fiber found in fruits, vegetables, oat products, and legumes.  FRUITS AND VEGETABLES It is good to eat fruits and vegetables. Besides being sources of fiber, both are rich in vitamins and some minerals. They help you get the daily allowances of these nutrients. Fruits and vegetables can be used for snacks and desserts.  MEATS Limit lean meat, chicken, [place], and fish to no more than 6 ounces per day.  Beef, Pork, and Lamb Use lean cuts of beef, pork, and lamb. Lean cuts include:  Extra-lean ground beef.  Arm roast.  Sirloin tip.  Center-cut ham.  Round  steak.  Loin chops.  Rump roast.  Tenderloin.  Trim all fat off the outside of meats before cooking. It is not necessary to severely decrease the intake of red meat, but lean choices should be made. Lean meat is rich in protein and contains a highly absorbable form of iron. Premenopausal women, in particular, should avoid reducing lean red meat because this could increase the risk for low red blood cells (iron-deficiency anemia).  Chicken and [place] These are good sources of protein. The fat of poultry can be reduced by removing the skin and underlying fat layers before cooking. Chicken and [place] can be substituted for lean red meat in the diet. Poultry should not be fried or covered with high-fat sauces.  Fish and Shellfish Fish is a good source of protein. Shellfish contain cholesterol, but they usually are low in saturated fatty acids. The preparation of fish is important. Like chicken and [place], they should not be fried or covered with high-fat sauces.  EGGS Egg whites contain no fat or cholesterol. They can be eaten often. Try 1 to 2 egg whites instead of whole eggs in recipes or use egg substitutes  that do not contain yolk.  MILK AND DAIRY PRODUCTS Use skim or 1% milk instead of 2% or whole milk. Decrease whole milk, natural, and processed cheeses. Use nonfat or low-fat (2%) cottage cheese or low-fat cheeses made from vegetable oils. Choose nonfat or low-fat (1 to 2%) yogurt. Experiment with evaporated skim milk in recipes that call for heavy cream. Substitute low-fat yogurt or low-fat cottage cheese for sour cream in dips and salad dressings. Have at least 2 servings of low-fat dairy products, such as 2 glasses of skim (or 1%) milk each day to help get your daily calcium intake.  FATS AND OILS Reduce the total intake of fats, especially saturated fat. Butterfat, lard, and beef fats are high in saturated fat and cholesterol. These should be avoided as much as possible. Vegetable fats do  not contain cholesterol, but certain vegetable fats, such as coconut oil, palm oil, and palm kernel oil are very high in saturated fats. These should be limited. These fats are often used in bakery goods, processed foods, popcorn, oils, and nondairy creamers. Vegetable shortenings and some peanut butters contain hydrogenated oils, which are also saturated fats. Read the labels on these foods and check for saturated vegetable oils.  Unsaturated vegetable oils and fats do not raise blood cholesterol. However, they should be limited because they are fats and are high in calories. Total fat should still be limited to 30% of your daily caloric intake. Desirable liquid vegetable oils are corn oil, cottonseed oil, olive oil, canola oil, safflower oil, soybean oil, and sunflower oil. Peanut oil is not as good, but small amounts are acceptable. Buy a heart-healthy tub margarine that has no partially hydrogenated oils in the ingredients. Mayonnaise and salad dressings often are made from unsaturated fats, but they should also be limited because of their high calorie and fat content. Seeds, nuts, peanut butter, olives, and avocados are high in fat, but the fat is mainly the unsaturated type. These foods should be limited mainly to avoid excess calories and fat.  OTHER EATING TIPS Snacks  Most sweets should be limited as snacks. They tend to be rich in calories and fats, and their caloric content outweighs their nutritional value. Some good choices in snacks are graham crackers, melba toast, soda crackers, bagels (no egg), English muffins, fruits, and vegetables. These snacks are preferable to snack crackers, [place] fries, and chips. Popcorn should be air-popped or cooked in small amounts of liquid vegetable oil.  Desserts Eat fruit, low-fat yogurt, and fruit ices instead of pastries, cake, and cookies. Sherbet, angel food cake, gelatin dessert, frozen low-fat yogurt, or other frozen products that do not contain  saturated fat (pure fruit juice bars, frozen ice pops) are also acceptable.   COOKING METHODS Choose those methods that use little or no fat. They include: Poaching.  Braising.  Steaming.  Grilling.  Baking.  Stir-frying.  Broiling.  Microwaving.  Foods can be cooked in a nonstick pan without added fat, or use a nonfat cooking spray in regular cookware. Limit fried foods and avoid frying in saturated fat. Add moisture to lean meats by using water, broth, cooking wines, and other nonfat or low-fat sauces along with the cooking methods mentioned above. Soups and stews should be chilled after cooking. The fat that forms on top after a few hours in the refrigerator should be skimmed off. When preparing meals, avoid using excess salt. Salt can contribute to raising blood pressure in some people.  EATING AWAY FROM HOME Order entres, potatoes,  and vegetables without sauces or butter. When meat exceeds the size of a deck of cards (3 to 4 ounces), the rest can be taken home for another meal. Choose vegetable or fruit salads and ask for low-calorie salad dressings to be served on the side. Use dressings sparingly. Limit high-fat toppings, such as bacon, crumbled eggs, cheese, sunflower seeds, and olives. Ask for heart-healthy tub margarine instead of butter.

## 2015-08-15 NOTE — Transfer of Care (Signed)
Immediate Anesthesia Transfer of Care Note  Patient: [name]  Procedure(s) Performed: Procedure(s) with comments: ESOPHAGOGASTRODUODENOSCOPY (EGD) WITH PROPOFOL (N/A) - 0815  Patient Location: PACU  Anesthesia Type:MAC  Level of Consciousness: awake, alert , oriented and patient cooperative  Airway & Oxygen Therapy: Patient Spontanous Breathing and Patient connected to face mask oxygen  Post-op Assessment: Report given to RN and Post -op Vital signs reviewed and stable  Post vital signs: Reviewed and stable  Last Vitals:  Filed Vitals:   08/15/15 0820 08/15/15 0825  BP: 129/92 130/85  Pulse:    Temp:    Resp: 14 21    Complications: No apparent anesthesia complications

## 2015-08-15 NOTE — Anesthesia Postprocedure Evaluation (Signed)
Anesthesia Post Note  Patient: [name]  Procedure(s) Performed: Procedure(s) (LRB): ESOPHAGOGASTRODUODENOSCOPY (EGD) WITH PROPOFOL (N/A)  Patient location during evaluation: PACU Anesthesia Type: MAC Level of consciousness: awake and alert and oriented Pain management: pain level controlled Vital Signs Assessment: post-procedure vital signs reviewed and stable Respiratory status: respiratory function stable Cardiovascular status: stable Postop Assessment: no signs of nausea or vomiting Anesthetic complications: no    Last Vitals:  Filed Vitals:   08/15/15 0820 08/15/15 0825  BP: 129/92 130/85  Pulse:    Temp:    Resp: 14 21    Last Pain:  Filed Vitals:   08/15/15 0825  PainSc: 6                  ADAMS, AMY A

## 2015-08-15 NOTE — Anesthesia Preprocedure Evaluation (Signed)
Anesthesia Evaluation  Patient identified by MRN, date of birth, ID band Patient awake    Reviewed: Allergy & Precautions, H&P , NPO status , Patient's Chart, lab work & pertinent test results  History of Anesthesia Complications Negative for: history of anesthetic complications  Airway Mallampati: I  TM Distance: >3 FB     Dental  (+) Teeth Intact   Pulmonary shortness of breath and with exertion, sleep apnea , COPD, Current Smoker,    breath sounds clear to auscultation       Cardiovascular hypertension, Pt. on medications  Rhythm:Regular Rate:Normal     Neuro/Psych PSYCHIATRIC DISORDERS Anxiety Depression    GI/Hepatic GERD  Medicated,(+)     substance abuse  alcohol use,   Endo/Other  Morbid obesity  Renal/GU      Musculoskeletal   Abdominal   Peds  Hematology   Anesthesia Other Findings   Reproductive/Obstetrics                             Anesthesia Physical Anesthesia Plan  ASA: III  Anesthesia Plan: MAC   Post-op Pain Management:    Induction: Intravenous  Airway Management Planned: Simple Face Mask  Additional Equipment:   Intra-op Plan:   Post-operative Plan:   Informed Consent: I have reviewed the patients History and Physical, chart, labs and discussed the procedure including the risks, benefits and alternatives for the proposed anesthesia with the patient or authorized representative who has indicated his/her understanding and acceptance.     Plan Discussed with:   Anesthesia Plan Comments:         Anesthesia Quick Evaluation

## 2015-08-15 NOTE — H&P (Signed)
Primary Care Physician:  Avon Gully, MD Primary Gastroenterologist:  Dr. Darrick Penna  Pre-Procedure History & Physical: HPI:  [name] is a 35 y.o. male here for DYSPEPSIA/ELEVATED LIVER ENZYMES, BMI > 40/EPIGASTRIC PAIN/WEIGHT GAIN,  Past Medical History  Diagnosis Date  . GERD (gastroesophageal reflux disease)     Bravo study Nov 2009 on Prilosec 40 BID with adequate acid suppression  . Hypertension   . Anxiety   . Depression     increased over past several months  . Chronic abdominal pain     HIDA scan Sept 2010, EF 93%, s/p chole oct 2010; evaluated at [facility] for abdominal wall pain; Imipramine added May 2011  . Chronic vomiting     normal GES 2009  . S/P endoscopy     2008 Dr. Karilyn Cota: erosive reflux esophagitis, antral gastritis, H.pylori serologies neg, Nov 2009 Dr. Darrick Penna: gastritis, benign esophageal polyp, no H.pylori,  Feb 2011: [facility], Dr. Bubba Hales: normal esophagus, normal stomach, normal duodenum, path unremarkable  . Ventral hernia   . Shortness of breath     SOB even with no exertion  . Sleep apnea     supposed to sleep with CPAP  . Bipolar 2 disorder (HCC)   . Hiatal hernia     Past Surgical History  Procedure Laterality Date  . Ventral hernia repair  March 2012    Dr. Lovell Sheehan  . Knee surgery Right 2009  . Cholecystectomy  10/10  . Appendectomy  5/10  . Esophagogastroduodenoscopy  05/2006    H.pylori neg  . Inguinal hernia repair      child  . Esophagogastroduodenoscopy  03/23/2008    Lilyanne Mcquown-gastritis benign esophageal polyp, otherwise normal. Negative H. pylori he (propofol)  . Incisional hernia repair  03/04/2012    Procedure: LAPAROSCOPIC INCISIONAL HERNIA;  Surgeon: Dalia Heading, MD;  Location: [facility];  Service: General;  Laterality: N/A;  repair, recurrent  . Insertion of mesh  03/04/2012    Procedure: INSERTION OF MESH;  Surgeon: Dalia Heading, MD;  Location: [facility];  Service: General;  Laterality: N/A;  . Incisional hernia repair N/A  01/12/2014    Procedure: HERNIA REPAIR INCISIONAL WITH MESH;  Surgeon: Dalia Heading, MD;  Location: [facility];  Service: General;  Laterality: N/A;  . Insertion of mesh N/A 01/12/2014    Procedure: INSERTION OF MESH;  Surgeon: Dalia Heading, MD;  Location: [facility];  Service: General;  Laterality: N/A;    Prior to Admission medications   Medication Sig Start Date End Date Taking? Authorizing Provider  ALPRAZolam Prudy Feeler) 1 MG tablet Take 1 mg by mouth 4 (four) times daily.    Yes Historical Provider, MD  HYDROcodone-acetaminophen (NORCO) 7.5-325 MG tablet Take 1 tablet by mouth every 4 (four) hours as needed for moderate pain (Must last 30 days.  Do not drive or operate machinery while taking this medicine.). 07/27/15  Yes Darreld Mclean, MD  ibuprofen (ADVIL,MOTRIN) 800 MG tablet Take 800 mg by mouth every 6 (six) hours.   Yes Historical Provider, MD  lisinopril (PRINIVIL,ZESTRIL) 40 MG tablet Take 1 tablet (40 mg total) by mouth daily. 04/29/15  Yes Houston Siren, MD  omeprazole (PRILOSEC) 40 MG capsule Take 40 mg by mouth 2 (two) times daily.   Yes Historical Provider, MD  topiramate (TOPAMAX) 100 MG tablet Take 100 mg by mouth 2 (two) times daily.   Yes Historical Provider, MD    Allergies as of 07/25/2015 - Review Complete 07/25/2015  Allergen Reaction Noted  .  Dexilant [dexlansoprazole] Other (See Comments) 01/09/2012  . Propoxyphene n-acetaminophen Other (See Comments)   . Zofran odt [ondansetron]  03/08/2012  . Zofran [ondansetron hcl] Nausea And Vomiting 01/21/2012    Family History  Problem Relation Age of Onset  . Colon cancer Neg Hx   . Diabetes Mother     Social History   Social History  . Marital Status: Single    Spouse Name: N/A  . Number of Children: 1  . Years of Education: N/A   Occupational History  . disabled     applying for disability, depression  .     Social History Main Topics  . Smoking status: Current Every Day Smoker -- 1.00 packs/day for 15 years    Types:  Cigarettes  . Smokeless tobacco: Never Used     Comment: Less than 1/2 pk a day. trying to quit.  03/04/12 1255 states "was doing good, but now up to about a pack per day"  . Alcohol Use: No  . Drug Use: No  . Sexual Activity: No   Other Topics Concern  . Not on file   Social History Narrative   Lives alone    Review of Systems: See HPI, otherwise negative ROS   Physical Exam: BP 155/90 mmHg  Pulse 75  Temp(Src) 97.7 F (36.5 C) (Oral)  Resp 21  Ht  (1.753 m)  Wt 320 lb (145.151 kg)  BMI 47.23 kg/m2  SpO2 97% General:   Alert,  pleasant and cooperative in NAD Head:  Normocephalic and atraumatic. Neck:  Supple; Lungs:  Clear throughout to auscultation.    Heart:  Regular rate and rhythm. Abdomen:  Soft, nontender and nondistended. Normal bowel sounds, without guarding, and without rebound.   Neurologic:  Alert and  oriented x4;  grossly normal neurologically.  Impression/Plan:     DYSPEPSIA/ELEVATED LIVER ENZYMES, BMI > 40/EPIGASTRIC PAIN/WEIGHT GAIN  PLAN:  EGD TODAY TO SCREEN FOR VARICES

## 2015-08-15 NOTE — Telephone Encounter (Signed)
PT SEEN [facility]T [facility] IN PAST 5 YRS. LAP BAND RECOMMENDED FOR MORBID OBESITY. PT DECLINED. PT AWARE OF RISKS OF MORBID OBESITY, UNCONTROLLED GERD, PREMATURE DEATH.  NOW WANTS TO RE-ADDRESS WITH [facility]. REFER FOR lap band.

## 2015-08-15 NOTE — Telephone Encounter (Signed)
Referral sent 

## 2015-08-15 NOTE — Progress Notes (Signed)
REVIEWED-NO ADDITIONAL RECOMMENDATIONS. 

## 2015-08-16 NOTE — Op Note (Signed)
[facility] Patient Name: [name] Procedure Date: 08/15/2015 8:21 AM MRN: [id number] Date of Birth: 1980-06-05 Attending MD: Jonette Eva MD, MD CSN: [id number] Age: 35 Admit Type: Outpatient Procedure:                Upper GI endoscopy Indications:              Abdominal pain in the right upper quadrant, Nausea                            with vomiting. PT PRESENTED TO ME IN 2009 WITH                            NAUSEA/VOMITING/EPGASTRIC PAIN. Pt WEIGHED 259 LBS                            NOW WEIGHS 320 LBS(BMI 49). DECLINED GASTRIC BYPASS                            IN PAST BECAUSE HE DID NOT WANT TO GIVE UP                            CHOCOLATE. Providers:                Jonette Eva MD, MD, Brain Hilts, RN, Calton Dach, Technician Referring MD:             Avon Gully (Referring MD) Medicines:                Propofol per Anesthesia Complications:            No immediate complications. Estimated Blood Loss:     Estimated blood loss was minimal. Procedure:                Pre-Anesthesia Assessment:                           - Prior to the procedure, a History and Physical                            was performed, and patient medications and                            allergies were reviewed. The patient's tolerance of                            previous anesthesia was also reviewed. The risks                            and benefits of the procedure and the sedation                            options and risks were discussed with the patient.  All questions were answered, and informed consent                            was obtained. Prior Anticoagulants: The patient has                            taken no previous anticoagulant or antiplatelet                            agents. ASA Grade Assessment: II - A patient with                            mild systemic disease. After reviewing the risks   and benefits, the patient was deemed in                            satisfactory condition to undergo the procedure.                           After obtaining informed consent, the endoscope was                            passed under direct vision. Throughout the                            procedure, the patient's blood pressure, pulse, and                            oxygen saturations were monitored continuously. The                            [id number] ([id number]) scope was introduced through the                            mouth, and advanced to the second part of duodenum.                            The upper GI endoscopy was accomplished without                            difficulty. The patient tolerated the procedure                            well. Scope In: 8:48:39 AM Scope Out: 8:54:21 AM Total Procedure Duration: 0 hours 5 minutes 42 seconds  Findings:      The examined esophagus was normal.      Scattered mild inflammation characterized by erythema was found in the       gastric antrum.      A small hiatal hernia was present.      The duodenal bulb and second portion of the duodenum were normal. Impression:               - Normal esophagus.                           -  Gastritis.                           - Small hiatal hernia.                           - Normal duodenal bulb and second portion of the                            duodenum. Biopsied. Moderate Sedation:      Per Anesthesia Care Recommendation:           - Patient has a contact number available for                            emergencies. The signs and symptoms of potential                            delayed complications were discussed with the                            patient. Return to normal activities tomorrow.                            Written discharge instructions were provided to the                            patient.                           - Low fat diet.                           - Use Prilosec  (omeprazole) 40 mg PO BID.                           - Await pathology results.                           - Return to my office in 4 months.                           SEE [facility] FOR GASTRIC BYPASS OPTIONS DUE TO SUPER                            MORBID OBESITY. EXPLAINED TO PT HE IS AT RISK OF                            PREMATURE DEATH DUE TO EFFECTS OF OBESITY AND                            BENEFITS OF LOSING WEIGHT.                           - Continue present medications. Procedure Code(s):        --- Professional ---  95284, Esophagogastroduodenoscopy, flexible,                            transoral; with biopsy, single or multiple Diagnosis Code(s):        --- Professional ---                           K29.70, Gastritis, unspecified, without bleeding                           K44.9, Diaphragmatic hernia without obstruction or                            gangrene                           R10.11, Right upper quadrant pain                           R11.2, Nausea with vomiting, unspecified CPT copyright 2016 American Medical Association. All rights reserved. The codes documented in this report are preliminary and upon coder review may  be revised to meet current compliance requirements. Jonette Eva, MD Jonette Eva MD, MD 08/16/2015 8:15:51 PM This report has been signed electronically. Number of Addenda: 0

## 2015-08-17 ENCOUNTER — Telehealth: Payer: Self-pay | Admitting: Gastroenterology

## 2015-08-17 ENCOUNTER — Encounter (HOSPITAL_COMMUNITY): Payer: Self-pay | Admitting: Gastroenterology

## 2015-08-17 NOTE — Telephone Encounter (Signed)
REVIEWED. CANCEL REFERRAL TO [facility].

## 2015-08-17 NOTE — Telephone Encounter (Signed)
Pt called today asking if his band wrap referral to [facility] could be sent to [facility] instead, because it'll be closer for him to drive. Please advise and call [phone]

## 2015-08-17 NOTE — Telephone Encounter (Signed)
Note and have done it

## 2015-08-17 NOTE — Telephone Encounter (Signed)
Called patient and told him that he would have to call [facility] to get set up for a class first before [facility] would see him. I game him the number and we are going to fax everything over to [facility].

## 2015-08-21 ENCOUNTER — Telehealth: Payer: Self-pay | Admitting: Gastroenterology

## 2015-08-21 NOTE — Telephone Encounter (Signed)
Please call pt. His stomach Bx shows gastritis DUE TO IBUPROFEN USE.    CONTINUE YOUR WEIGHT LOSS EFFORTS. YOU SHOULD LOSE 20 POUNDS.   FOLLOW A LOW FAT DIET. MEATS SHOULD BE BAKED, BROILED, OR BOILED. AVOID FRIED FOODS.   SEE [facility] for lap band and continue with NUTRITION RECOMMENDATION FOR A LOW FAT DIET AND FOR CALORIE MANAGEMENT.  TAKE OMEPRAZOLE 30 MINUTES PRIOR TO MEALS TWICE DAILY.  AVOID TRIGGERS FOR REFLUX.  FOLLOW UP IN 4 MOS E30 GERD/GASTRITIS/NAUSEA/VOMITING.

## 2015-08-21 NOTE — Telephone Encounter (Signed)
LMOM to call.

## 2015-08-21 NOTE — Telephone Encounter (Signed)
APPT MADE

## 2015-08-23 NOTE — Telephone Encounter (Signed)
Letter mailed for pt to call.  

## 2015-08-24 ENCOUNTER — Telehealth: Payer: Self-pay

## 2015-08-24 NOTE — Telephone Encounter (Signed)
Patient no showed for appt with Dr. Lovell Sheehan.

## 2015-08-29 ENCOUNTER — Telehealth: Payer: Self-pay | Admitting: Orthopaedic Surgery

## 2015-08-29 MED ORDER — HYDROCODONE-ACETAMINOPHEN 7.5-325 MG PO TABS: 1.0000 | ORAL_TABLET | ORAL | Status: DC | PRN

## 2015-08-29 NOTE — Telephone Encounter (Signed)
Rx Done . 

## 2015-08-29 NOTE — Telephone Encounter (Signed)
Patient called and requested a refill on Norco 7.5-325 mgs.  Qty 120  Sig: Take 1 tablet by mouth every 4 (four) hours as needed for moderate pain (Must last 30 days. Do not drive or operate machinery while taking this medicine.).  This was last filled on 07-27-15

## 2015-09-21 ENCOUNTER — Ambulatory Visit: Payer: Medicaid Other | Admitting: Orthopaedic Surgery

## 2015-09-28 ENCOUNTER — Ambulatory Visit (INDEPENDENT_AMBULATORY_CARE_PROVIDER_SITE_OTHER): Payer: Medicaid Other | Admitting: Orthopaedic Surgery

## 2015-09-28 ENCOUNTER — Encounter: Payer: Self-pay | Admitting: Orthopaedic Surgery

## 2015-09-28 VITALS — BP 149/84 | HR 88 | Temp 97.5°F | Ht 69.0 in | Wt 318.0 lb

## 2015-09-28 DIAGNOSIS — F17200 Nicotine dependence, unspecified, uncomplicated: Secondary | ICD-10-CM

## 2015-09-28 DIAGNOSIS — K2100 Gastro-esophageal reflux disease with esophagitis, without bleeding: Secondary | ICD-10-CM

## 2015-09-28 DIAGNOSIS — M25571 Pain in right ankle and joints of right foot: Secondary | ICD-10-CM

## 2015-09-28 DIAGNOSIS — F17210 Nicotine dependence, cigarettes, uncomplicated: Secondary | ICD-10-CM

## 2015-09-28 MED ORDER — HYDROCODONE-ACETAMINOPHEN 7.5-325 MG PO TABS: 1.0000 | ORAL_TABLET | ORAL | Status: DC | PRN

## 2015-09-28 NOTE — Progress Notes (Signed)
Patient VB:[name], male DOB:Sep 27, 1980, 35 y.o. OKH:[id number]  Chief Complaint  Patient presents with  . Follow-up    Right ankle pain    HPI  [name] is a 35 y.o. male who has chronic pain and swelling of the right lateral ankle.  He has no new trauma.  He has no redness.  He has been active.  He has been evaluated for possible lap band procedure for obesity.  This would help his stress to the ankle.  He still smokes and is not willing to quit.   HPI  Body mass index is 46.94 kg/(m^2).  ROS  Review of Systems  Constitutional:       Patient does not have Diabetes Mellitus. Patient has hypertension. Patient does have COPD or shortness of breath. Patient has BMI > 35. Patient has current smoking history.   Respiratory: Positive for cough, shortness of breath and wheezing.        Sleep apnea  Gastrointestinal: Positive for nausea and vomiting.       GERD  Neurological: Negative for numbness.  Psychiatric/Behavioral: Positive for sleep disturbance. The patient is nervous/anxious.     Past Medical History  Diagnosis Date  . GERD (gastroesophageal reflux disease)     Bravo study Nov 2009 on Prilosec 40 BID with adequate acid suppression  . Hypertension   . Anxiety   . Depression     increased over past several months  . Chronic abdominal pain     HIDA scan Sept 2010, EF 93%, s/p chole oct 2010; evaluated at [facility] for abdominal wall pain; Imipramine added May 2011  . Chronic vomiting     normal GES 2009  . S/P endoscopy     2008 Dr. Karilyn Cota: erosive reflux esophagitis, antral gastritis, H.pylori serologies neg, Nov 2009 Dr. Darrick Penna: gastritis, benign esophageal polyp, no H.pylori,  Feb 2011: [facility], Dr. Bubba Hales: normal esophagus, normal stomach, normal duodenum, path unremarkable  . Ventral hernia   . Shortness of breath     SOB even with no exertion  . Sleep apnea     supposed to sleep with CPAP  . Bipolar 2 disorder (HCC)   . Hiatal hernia      Past Surgical History  Procedure Laterality Date  . Ventral hernia repair  March 2012    Dr. Lovell Sheehan  . Knee surgery Right 2009  . Cholecystectomy  10/10  . Appendectomy  5/10  . Esophagogastroduodenoscopy  05/2006    H.pylori neg  . Inguinal hernia repair      child  . Esophagogastroduodenoscopy  03/23/2008    Fields-gastritis benign esophageal polyp, otherwise normal. Negative H. pylori he (propofol)  . Incisional hernia repair  03/04/2012    Procedure: LAPAROSCOPIC INCISIONAL HERNIA;  Surgeon: Dalia Heading, MD;  Location: [facility];  Service: General;  Laterality: N/A;  repair, recurrent  . Insertion of mesh  03/04/2012    Procedure: INSERTION OF MESH;  Surgeon: Dalia Heading, MD;  Location: [facility];  Service: General;  Laterality: N/A;  . Incisional hernia repair N/A 01/12/2014    Procedure: HERNIA REPAIR INCISIONAL WITH MESH;  Surgeon: Dalia Heading, MD;  Location: [facility];  Service: General;  Laterality: N/A;  . Insertion of mesh N/A 01/12/2014    Procedure: INSERTION OF MESH;  Surgeon: Dalia Heading, MD;  Location: [facility];  Service: General;  Laterality: N/A;  . Esophagogastroduodenoscopy (egd) with propofol N/A 08/15/2015    Procedure: ESOPHAGOGASTRODUODENOSCOPY (EGD) WITH PROPOFOL;  Surgeon: Duncan Dull  Loreen Freud, MD;  Location: [facility];  Service: Endoscopy;  Laterality: N/A;  6295  . Biopsy  08/15/2015    Procedure: BIOPSY;  Surgeon: West Bali, MD;  Location: [facility];  Service: Endoscopy;;  gastric bx    Family History  Problem Relation Age of Onset  . Colon cancer Neg Hx   . Diabetes Mother     Social History Social History  Substance Use Topics  . Smoking status: Current Every Day Smoker -- 1.00 packs/day for 15 years    Types: Cigarettes  . Smokeless tobacco: Never Used     Comment: Less than 1/2 pk a day. trying to quit.  03/04/12 1255 states "was doing good, but now up to about a pack per day"  . Alcohol Use: No    Allergies  Allergen Reactions  .  Dexilant [Dexlansoprazole] Other (See Comments)    Pass out  . Propoxyphene N-Acetaminophen Other (See Comments)    Unknown   . Zofran Odt [Ondansetron]     vomit  . Zofran [Ondansetron Hcl] Nausea And Vomiting    Current Outpatient Prescriptions  Medication Sig Dispense Refill  . ALPRAZolam (XANAX) 1 MG tablet Take 1 mg by mouth 4 (four) times daily.     Marland Kitchen HYDROcodone-acetaminophen (NORCO) 7.5-325 MG tablet Take 1 tablet by mouth every 4 (four) hours as needed for moderate pain (Must last 30 days.  Do not drive or operate machinery while taking this medicine.). 120 tablet 0  . ibuprofen (ADVIL,MOTRIN) 800 MG tablet Take 800 mg by mouth every 6 (six) hours.    Marland Kitchen lisinopril (PRINIVIL,ZESTRIL) 40 MG tablet Take 1 tablet (40 mg total) by mouth daily. 30 tablet 1  . omeprazole (PRILOSEC) 40 MG capsule Take 40 mg by mouth 2 (two) times daily.    Marland Kitchen topiramate (TOPAMAX) 100 MG tablet Take 100 mg by mouth 2 (two) times daily.     No current facility-administered medications for this visit.     Physical Exam  Blood pressure 149/84, pulse 88, temperature 97.5 F (36.4 C), height 5\' 9"  (1.753 m), weight 318 lb (144.244 kg).  Constitutional: overall normal hygiene, normal nutrition, well developed, normal grooming, normal body habitus. Assistive device:none  Musculoskeletal: gait and station Limp right, muscle tone and strength are normal, no tremors or atrophy is present.  .  Neurological: coordination overall normal.  Deep tendon reflex/nerve stretch intact.  Sensation normal.  Cranial nerves II-XII intact.   Skin:   normal overall no scars, lesions, ulcers or rashes. No psoriasis.  Psychiatric: Alert and oriented x 3.  Recent memory intact, remote memory unclear.  Normal mood and affect. Well groomed.  Good eye contact.  Cardiovascular: overall no swelling, no varicosities, no edema bilaterally, normal temperatures of the legs and arms, no clubbing, cyanosis and good capillary  refill.  Lymphatic: palpation is normal.  Right Ankle Exam  Swelling: moderate  Tenderness  The patient is experiencing tenderness in the ATF and lateral malleolus.    Range of Motion  The patient has normal right ankle ROM.  Muscle Strength  The patient has normal right ankle strength.  Tests  Anterior drawer: negative Other  Erythema: absent Scars: absent Sensation: normal Pulse: present    Left Ankle Exam  Left ankle exam is normal.  Range of Motion  The patient has normal left ankle ROM.   Muscle Strength  The patient has normal left ankle strength.  Tests  Anterior drawer: negative  Other  Erythema: absent  Scars: absent Sensation: normal Pulse: present       The patient has been educated about the nature of the problem(s) and counseled on treatment options.  The patient appeared to understand what I have discussed and is in agreement with it.  Encounter Diagnoses  Name Primary?  . Pain in joint, ankle and foot, right Yes  . Gastroesophageal reflux disease with esophagitis   . Morbid obesity due to excess calories [facility])     PLAN Call if any problems.  Precautions discussed.  Continue current medications.   Return to clinic 3 months

## 2015-10-13 ENCOUNTER — Encounter (HOSPITAL_COMMUNITY): Payer: Self-pay | Admitting: Cardiology

## 2015-10-13 ENCOUNTER — Emergency Department (HOSPITAL_COMMUNITY): Payer: Medicaid Other

## 2015-10-13 ENCOUNTER — Emergency Department (HOSPITAL_COMMUNITY)
Admission: EM | Admit: 2015-10-13 | Discharge: 2015-10-13 | Disposition: A | Discharge status: Home / Self Care | Payer: Medicaid Other | Attending: Emergency Medicine | Admitting: Emergency Medicine

## 2015-10-13 DIAGNOSIS — Y999 Unspecified external cause status: Secondary | ICD-10-CM | POA: Diagnosis not present

## 2015-10-13 DIAGNOSIS — F329 Major depressive disorder, single episode, unspecified: Secondary | ICD-10-CM | POA: Insufficient documentation

## 2015-10-13 DIAGNOSIS — Y929 Unspecified place or not applicable: Secondary | ICD-10-CM | POA: Insufficient documentation

## 2015-10-13 DIAGNOSIS — W19XXXA Unspecified fall, initial encounter: Secondary | ICD-10-CM | POA: Insufficient documentation

## 2015-10-13 DIAGNOSIS — F17210 Nicotine dependence, cigarettes, uncomplicated: Secondary | ICD-10-CM | POA: Diagnosis not present

## 2015-10-13 DIAGNOSIS — I10 Essential (primary) hypertension: Secondary | ICD-10-CM | POA: Diagnosis not present

## 2015-10-13 DIAGNOSIS — S63501A Unspecified sprain of right wrist, initial encounter: Secondary | ICD-10-CM | POA: Insufficient documentation

## 2015-10-13 DIAGNOSIS — Y939 Activity, unspecified: Secondary | ICD-10-CM | POA: Insufficient documentation

## 2015-10-13 DIAGNOSIS — S59911A Unspecified injury of right forearm, initial encounter: Secondary | ICD-10-CM | POA: Diagnosis present

## 2015-10-13 MED ORDER — IBUPROFEN 800 MG PO TABS: 800.0000 mg | ORAL_TABLET | Freq: Four times a day (QID) | ORAL | Status: DC

## 2015-10-13 NOTE — Discharge Instructions (Signed)
Wrist Pain There are many things that can cause wrist pain. Some common causes include:  An injury to the wrist area, such as a sprain, strain, or fracture.  Overuse of the joint.  A condition that causes increased pressure on a nerve in the wrist (carpal tunnel syndrome).  Wear and tear of the joints that occurs with aging (osteoarthritis).  A variety of other types of arthritis. Sometimes, the cause of wrist pain is not known. The pain often goes away when you follow your health care provider's instructions for relieving pain at home. If your wrist pain continues, tests may need to be done to diagnose your condition. HOME CARE INSTRUCTIONS Pay attention to any changes in your symptoms. Take these actions to help with your pain:  Rest the wrist area for at least 48 hours or as told by your health care provider.  If directed, apply ice to the injured area:  Put ice in a plastic bag.  Place a towel between your skin and the bag.  Leave the ice on for 20 minutes, 2-3 times per day.  Keep your arm raised (elevated) above the level of your heart while you are sitting or lying down.  If a splint or elastic bandage has been applied, use it as told by your health care provider.  Remove the splint or bandage only as told by your health care provider.  Loosen the splint or bandage if your fingers become numb or have a tingling feeling, or if they turn cold or blue.  Take over-the-counter and prescription medicines only as told by your health care provider.  Keep all follow-up visits as told by your health care provider. This is important. SEEK MEDICAL CARE IF:  Your pain is not helped by treatment.  Your pain gets worse. SEEK IMMEDIATE MEDICAL CARE IF:  Your fingers become swollen.  Your fingers turn white, very red, or cold and blue.  Your fingers are numb or have a tingling feeling.  You have difficulty moving your fingers.   This information is not intended to replace  advice given to you by your health care provider. Make sure you discuss any questions you have with your health care provider.   Document Released: 01/30/2005 Document Revised: 01/11/2015 Document Reviewed: 09/07/2014 Elsevier Interactive Patient Education 2016 Elsevier Inc.  

## 2015-10-13 NOTE — ED Provider Notes (Signed)
CSN: [id number]     Arrival date & time 10/13/15  4098 History   First MD Initiated Contact with Patient 10/13/15 [phone]     Chief Complaint  Patient presents with  . Arm Injury     (Consider location/radiation/quality/duration/timing/severity/associated sxs/prior Treatment) Patient is a 35 y.o. male presenting with arm injury. The history is provided by the patient. No language interpreter was used.  Arm Injury Location:  Arm Time since incident:  1 week Arm location:  R forearm Pain details:    Quality:  Aching   Radiates to:  Does not radiate   Severity:  Moderate   Onset quality:  Gradual   Progression:  Worsening Chronicity:  New Handedness:  Right-handed Dislocation: no   Foreign body present:  No foreign bodies Tetanus status:  Up to date Relieved by:  Nothing Worsened by:  Nothing tried Ineffective treatments:  None tried Associated symptoms: no neck pain   Risk factors: no concern for non-accidental trauma   Pt reports he fell a week ago and injured right forearm.  Pt reports painful from elbow to wrist. Pt reports hand is now swollen  Past Medical History  Diagnosis Date  . GERD (gastroesophageal reflux disease)     Bravo study Nov 2009 on Prilosec 40 BID with adequate acid suppression  . Hypertension   . Anxiety   . Depression     increased over past several months  . Chronic abdominal pain     HIDA scan Sept 2010, EF 93%, s/p chole oct 2010; evaluated at [facility] for abdominal wall pain; Imipramine added May 2011  . Chronic vomiting     normal GES 2009  . S/P endoscopy     2008 Dr. Karilyn Cota: erosive reflux esophagitis, antral gastritis, H.pylori serologies neg, Nov 2009 Dr. Darrick Penna: gastritis, benign esophageal polyp, no H.pylori,  Feb 2011: [facility], Dr. Bubba Hales: normal esophagus, normal stomach, normal duodenum, path unremarkable  . Ventral hernia   . Shortness of breath     SOB even with no exertion  . Sleep apnea     supposed to sleep with CPAP  . Bipolar 2  disorder (HCC)   . Hiatal hernia    Past Surgical History  Procedure Laterality Date  . Ventral hernia repair  March 2012    Dr. Lovell Sheehan  . Knee surgery Right 2009  . Cholecystectomy  10/10  . Appendectomy  5/10  . Esophagogastroduodenoscopy  05/2006    H.pylori neg  . Inguinal hernia repair      child  . Esophagogastroduodenoscopy  03/23/2008    Fields-gastritis benign esophageal polyp, otherwise normal. Negative H. pylori he (propofol)  . Incisional hernia repair  03/04/2012    Procedure: LAPAROSCOPIC INCISIONAL HERNIA;  Surgeon: Dalia Heading, MD;  Location: [facility];  Service: General;  Laterality: N/A;  repair, recurrent  . Insertion of mesh  03/04/2012    Procedure: INSERTION OF MESH;  Surgeon: Dalia Heading, MD;  Location: [facility];  Service: General;  Laterality: N/A;  . Incisional hernia repair N/A 01/12/2014    Procedure: HERNIA REPAIR INCISIONAL WITH MESH;  Surgeon: Dalia Heading, MD;  Location: [facility];  Service: General;  Laterality: N/A;  . Insertion of mesh N/A 01/12/2014    Procedure: INSERTION OF MESH;  Surgeon: Dalia Heading, MD;  Location: [facility];  Service: General;  Laterality: N/A;  . Esophagogastroduodenoscopy (egd) with propofol N/A 08/15/2015    Procedure: ESOPHAGOGASTRODUODENOSCOPY (EGD) WITH PROPOFOL;  Surgeon: West Bali, MD;  Location:  [facility];  Service: Endoscopy;  Laterality: N/A;  6438  . Biopsy  08/15/2015    Procedure: BIOPSY;  Surgeon: West Bali, MD;  Location: [facility];  Service: Endoscopy;;  gastric bx   Family History  Problem Relation Age of Onset  . Colon cancer Neg Hx   . Diabetes Mother    Social History  Substance Use Topics  . Smoking status: Current Every Day Smoker -- 1.00 packs/day for 15 years    Types: Cigarettes  . Smokeless tobacco: Never Used     Comment: Less than 1/2 pk a day. trying to quit.  03/04/12 1255 states "was doing good, but now up to about a pack per day"  . Alcohol Use: No    Review of Systems   Musculoskeletal: Negative for neck pain.  All other systems reviewed and are negative.     Allergies  Dexilant; Propoxyphene n-acetaminophen; Zofran; and Zofran odt  Home Medications   Prior to Admission medications   Medication Sig Start Date End Date Taking? Authorizing Provider  ALPRAZolam Prudy Feeler) 1 MG tablet Take 1 mg by mouth 4 (four) times daily.     Historical Provider, MD  HYDROcodone-acetaminophen (NORCO) 7.5-325 MG tablet Take 1 tablet by mouth every 4 (four) hours as needed for moderate pain (Must last 30 days.  Do not drive or operate machinery while taking this medicine.). 09/28/15   Darreld Mclean, MD  ibuprofen (ADVIL,MOTRIN) 800 MG tablet Take 800 mg by mouth every 6 (six) hours.    Historical Provider, MD  lisinopril (PRINIVIL,ZESTRIL) 40 MG tablet Take 1 tablet (40 mg total) by mouth daily. 04/29/15   Houston Siren, MD  omeprazole (PRILOSEC) 40 MG capsule Take 40 mg by mouth 2 (two) times daily.    Historical Provider, MD  topiramate (TOPAMAX) 100 MG tablet Take 100 mg by mouth 2 (two) times daily.    Historical Provider, MD   BP 156/97 mmHg  Pulse 93  Temp(Src) 98.2 F (36.8 C) (Oral)  Resp 18  Ht 5\' 9"  (1.753 m)  Wt 127.007 kg  BMI 41.33 kg/m2  SpO2 98% Physical Exam  Constitutional: He is oriented to person, place, and time. He appears well-developed and well-nourished.  HENT:  Head: Normocephalic and atraumatic.  Eyes: Conjunctivae and EOM are normal. Pupils are equal, round, and reactive to light.  Neck: Normal range of motion.  Cardiovascular: Normal rate.   Pulmonary/Chest: Effort normal.  Abdominal: He exhibits no distension.  Musculoskeletal: He exhibits tenderness.  Tender swollen forearm,  Pain with movement,  nv and ns intact  Neurological: He is alert and oriented to person, place, and time.  Skin: Skin is warm.  Psychiatric: He has a normal mood and affect.  Nursing note and vitals reviewed.   ED Course  Procedures (including critical care  time) Labs Review Labs Reviewed - No data to display  Imaging Review Dg Forearm Right  10/13/2015  CLINICAL DATA:  Right forearm pain and swelling for 1 week. Tripped and fell at [employer] 1 week ago. EXAM: RIGHT FOREARM - 2 VIEW COMPARISON:  07/26/2010 FINDINGS: The wrist and elbow joints are maintained. No forearm fracture is identified. IMPRESSION: No acute bony findings. Electronically Signed   By: Rudie Meyer M.D.   On: 10/13/2015 09:41   I have personally reviewed and evaluated these images and lab results as part of my medical decision-making.   EKG Interpretation None      MDM   Final diagnoses:  Sprain of  right forearm, initial encounter    Wrist sprain Elevate ibuprofen    Elson Areas, PA-C 10/13/15 1014  Marily Memos, MD 10/13/15 1040

## 2015-10-13 NOTE — ED Notes (Signed)
Fall last week.  C/o pain to right forearm.

## 2015-10-13 NOTE — ED Notes (Signed)
Pt made aware to return if symptoms worsen or if any life threatening symptoms occur.   

## 2015-10-31 ENCOUNTER — Telehealth: Payer: Self-pay | Admitting: Orthopaedic Surgery

## 2015-10-31 MED ORDER — HYDROCODONE-ACETAMINOPHEN 7.5-325 MG PO TABS: 1.0000 | ORAL_TABLET | ORAL | Status: DC | PRN

## 2015-10-31 NOTE — Telephone Encounter (Signed)
Hydrocodone-Acetaminophen 7.5/325mg Qty 120 Tablets °

## 2015-10-31 NOTE — Telephone Encounter (Signed)
Rx done. 

## 2015-11-29 ENCOUNTER — Telehealth: Payer: Self-pay

## 2015-11-29 MED ORDER — HYDROCODONE-ACETAMINOPHEN 7.5-325 MG PO TABS: 1.0000 | ORAL_TABLET | ORAL | 0 refills | Status: DC | PRN

## 2015-11-29 NOTE — Telephone Encounter (Signed)
Request refill on Hysrocodone

## 2015-12-15 ENCOUNTER — Other Ambulatory Visit: Payer: Self-pay

## 2015-12-15 ENCOUNTER — Ambulatory Visit (INDEPENDENT_AMBULATORY_CARE_PROVIDER_SITE_OTHER): Payer: Medicaid Other | Admitting: Gastroenterology

## 2015-12-15 ENCOUNTER — Encounter: Payer: Self-pay | Admitting: Gastroenterology

## 2015-12-15 VITALS — BP 131/87 | HR 76 | Temp 96.9°F | Ht 69.0 in | Wt 299.8 lb

## 2015-12-15 DIAGNOSIS — K439 Ventral hernia without obstruction or gangrene: Secondary | ICD-10-CM

## 2015-12-15 DIAGNOSIS — R110 Nausea: Secondary | ICD-10-CM

## 2015-12-15 DIAGNOSIS — K219 Gastro-esophageal reflux disease without esophagitis: Secondary | ICD-10-CM

## 2015-12-15 DIAGNOSIS — R7401 Elevation of levels of liver transaminase levels: Secondary | ICD-10-CM

## 2015-12-15 DIAGNOSIS — E669 Obesity, unspecified: Secondary | ICD-10-CM

## 2015-12-15 DIAGNOSIS — R740 Nonspecific elevation of levels of transaminase and lactic acid dehydrogenase [LDH]: Secondary | ICD-10-CM

## 2015-12-15 MED ORDER — PROMETHAZINE HCL 25 MG PO TABS: 25.0000 mg | ORAL_TABLET | Freq: Three times a day (TID) | ORAL | 0 refills | Status: DC | PRN

## 2015-12-15 NOTE — Progress Notes (Signed)
cc'ed to pcp °

## 2015-12-15 NOTE — Assessment & Plan Note (Signed)
Encouraged patient to follow through with his lab work.

## 2015-12-15 NOTE — Patient Instructions (Signed)
1. RX for phenergan for nausea. Be careful, it can cause drowsiness. 2. Referral to Dr. Lovell Sheehan for hernias. 3. Referral to [facility] for lap band.

## 2015-12-15 NOTE — Assessment & Plan Note (Signed)
Referral back to Dr. Lovell Sheehan. Unclear whether or not he would be a candidate for surgical intervention. Remains morbidly obese. Go ahead and initiate referral for possible lap band at [facility] health. Patient complains of associated nausea due to abdominal pain. Rx for Phenergan provided, cautioned against sedation.

## 2015-12-15 NOTE — Assessment & Plan Note (Addendum)
Controlled on omeprazole 40mg  bid. Reinforced anti-reflex measures.

## 2015-12-15 NOTE — Progress Notes (Signed)
Primary Care Physician: Avon Gully, MD  Primary Gastroenterologist:  Jonette Eva, MD   Chief Complaint  Patient presents with  . Follow-up    hernia discomfort    HPI: [name] is a 35 y.o. male here follow-up of EGD done a few months ago. Patient has a history of chronic GERD, chronic intermittent epigastric pain, intermittent vomiting. EGD done to evaluate his symptoms. He had a small hiatal hernia, gastritis from ibuprofen use. He's been advised to lose weight. Referral for lap band sent to [facility] the patient states Medicaid would not approve it for [facility]. He states that he has to go to [facility]. Patient has lost 20 pounds since April. He is on omeprazole twice a day. He did not follow through with going to see Dr. Lovell Sheehan for hernia. He states he wanted to be home during the summer time with his daughter who was at a school. He also has a history of transaminitis and never had his blood work after his last office visit. Liver unremarkable September 2016 on CT. Abdominal ultrasound December 2016 unremarkable.  For the most part his heartburn is controlled. Denies dysphagia. Biggest concern is that of abdominal discomfort related to suspected ventral hernia. He has had repair couple of times. Last surgery 2015. Pain causes nausea. He has constant pain. Worse at times. Ready to see Dr. Lovell Sheehan to have this repaired if possible. Also ready to pursue referral to [facility] health for lap band. Movements are okay. No melena rectal bleeding.   Current Outpatient Prescriptions  Medication Sig Dispense Refill  . albuterol (ACCUNEB) 0.63 MG/3ML nebulizer solution 0.63 mg 4 (four) times daily as needed.    . ALPRAZolam (XANAX) 1 MG tablet Take 1 mg by mouth 4 (four) times daily.     . Brexpiprazole (REXULTI) 2 MG TABS Take 2 mg by mouth daily.    . Cariprazine HCl (VRAYLAR) 3 MG CAPS Take 3 mg by mouth at bedtime.    . fluticasone (FLONASE) 50 MCG/ACT  nasal spray Place 2 sprays into both nostrils daily.    Marland Kitchen HYDROcodone-acetaminophen (NORCO) 7.5-325 MG tablet Take 1 tablet by mouth every 4 (four) hours as needed for moderate pain (Must last 30 days.  Do not drive or operate machinery while taking this medicine.). 120 tablet 0  . ibuprofen (ADVIL,MOTRIN) 800 MG tablet Take 1 tablet (800 mg total) by mouth every 6 (six) hours. 21 tablet 0  . ipratropium (ATROVENT HFA) 17 MCG/ACT inhaler Inhale 2 puffs into the lungs every 6 (six) hours.    Marland Kitchen lisinopril (PRINIVIL,ZESTRIL) 40 MG tablet Take 1 tablet (40 mg total) by mouth daily. 30 tablet 1  . omeprazole (PRILOSEC) 40 MG capsule Take 40 mg by mouth 2 (two) times daily.    Marland Kitchen topiramate (TOPAMAX) 100 MG tablet Take 100 mg by mouth at bedtime.     . Vilazodone HCl (VIIBRYD) 20 MG TABS Take 20 mg by mouth daily.    Marland Kitchen     0   No current facility-administered medications for this visit.     Allergies as of 12/15/2015 - Review Complete 12/15/2015  Allergen Reaction Noted  . Dexilant [dexlansoprazole] Other (See Comments) 01/09/2012  . Propoxyphene n-acetaminophen Other (See Comments)   . Zofran odt [ondansetron]  03/08/2012  . Zofran [ondansetron hcl] Nausea And Vomiting 01/21/2012    ROS:  General: Negative for anorexia, fever, chills, fatigue, weakness. See hpi ENT: Negative for hoarseness, difficulty swallowing ,  nasal congestion. CV: Negative for chest pain, angina, palpitations, dyspnea on exertion, peripheral edema.  Respiratory: Negative for dyspnea at rest, dyspnea on exertion, cough, sputum, wheezing.  GI: See history of present illness. GU:  Negative for dysuria, hematuria, urinary incontinence, urinary frequency, nocturnal urination.  Endo: Negative for unusual weight change.    Physical Examination:   BP 131/87   Pulse 76   Temp (!) 96.9 F (36.1 C) (Oral)   Ht  (1.753 m)   Wt 299 lb 12.8 oz (136 kg)   BMI 44.27 kg/m   General: Well-nourished, well-developed in no  acute distress.  Eyes: No icterus. Mouth: Oropharyngeal mucosa moist and pink , no lesions erythema or exudate. Lungs: Clear to auscultation bilaterally.  Heart: Regular rate and rhythm, no murmurs rubs or gallops.  Abdomen: Bowel sounds are normal, tender in ventral area with ?abd wall defect, obese, no hepatosplenomegaly or masses, no abdominal bruits or hernia , no rebound or guarding.   Extremities: No lower extremity edema. No clubbing or deformities. Neuro: Alert and oriented x 4   Skin: Warm and dry, no jaundice.   Psych: Alert and cooperative, normal mood and affect.  Labs:  Lab Results  Component Value Date   CREATININE 0.99 08/11/2015   BUN 15 08/11/2015   NA 137 08/11/2015   K 3.7 08/11/2015   CL 104 08/11/2015   CO2 25 08/11/2015   Lab Results  Component Value Date   WBC 10.2 08/11/2015   HGB 14.1 08/11/2015   HCT 41.6 08/11/2015   MCV 93.9 08/11/2015   PLT 228 08/11/2015   Lab Results  Component Value Date   ALT 84 (H) 04/28/2015   AST 53 (H) 04/28/2015   ALKPHOS 60 04/28/2015   BILITOT 1.7 (H) 04/28/2015    Imaging Studies: No results found.

## 2015-12-21 ENCOUNTER — Telehealth: Payer: Self-pay | Admitting: Orthopaedic Surgery

## 2015-12-21 NOTE — Telephone Encounter (Signed)
Patient called to check when his next appointment was scheduled. I relayed to him that it is 8/24 @ 8:40. He then asked about being seen for his arm that he hurt at [facility] recently. He stated that he discussed with [employer] about them paying for his medical attention and they told him to use his private insurance. He told me that he could give me a number to call and speak with them and they would answer any questions that I may have, I told him that we don't get involved with situations of that nature. I did tell him that he would need to get a referral from his PCP in order for [place] to see him because he has Medicaid only. He stated he understood and will take care of it.

## 2015-12-28 ENCOUNTER — Ambulatory Visit (INDEPENDENT_AMBULATORY_CARE_PROVIDER_SITE_OTHER): Payer: Medicaid Other | Admitting: Orthopaedic Surgery

## 2015-12-28 ENCOUNTER — Encounter: Payer: Self-pay | Admitting: Orthopaedic Surgery

## 2015-12-28 VITALS — BP 132/84 | HR 87 | Ht 69.0 in | Wt 318.0 lb

## 2015-12-28 DIAGNOSIS — M25571 Pain in right ankle and joints of right foot: Secondary | ICD-10-CM

## 2015-12-28 DIAGNOSIS — F17210 Nicotine dependence, cigarettes, uncomplicated: Secondary | ICD-10-CM

## 2015-12-28 DIAGNOSIS — F17200 Nicotine dependence, unspecified, uncomplicated: Secondary | ICD-10-CM

## 2015-12-28 LAB — IRON AND TIBC
%SAT: 45 % (ref 15–60)
IRON: 118 ug/dL (ref 50–180)
TIBC: 260 ug/dL (ref 250–425)
UIBC: 142 ug/dL (ref 125–400)

## 2015-12-28 LAB — HEPATIC FUNCTION PANEL
ALT: 22 U/L (ref 9–46)
AST: 15 U/L (ref 10–40)
Albumin: 4.1 g/dL (ref 3.6–5.1)
Alkaline Phosphatase: 69 U/L (ref 40–115)
BILIRUBIN INDIRECT: 0.5 mg/dL (ref 0.2–1.2)
Bilirubin, Direct: 0.1 mg/dL (ref <=0.2)
TOTAL PROTEIN: 6.7 g/dL (ref 6.1–8.1)
Total Bilirubin: 0.6 mg/dL (ref 0.2–1.2)

## 2015-12-28 LAB — IGG, IGA, IGM
IGG (IMMUNOGLOBIN G), SERUM: 1229 mg/dL (ref 694–1618)
IGM, SERUM: 80 mg/dL (ref 48–271)
IgA: 78 mg/dL — ABNORMAL LOW (ref 81–463)

## 2015-12-28 LAB — FERRITIN: Ferritin: 118 ng/mL (ref 20–345)

## 2015-12-28 LAB — ANA: ANA: NEGATIVE

## 2015-12-28 MED ORDER — IBUPROFEN 800 MG PO TABS: 800.0000 mg | ORAL_TABLET | Freq: Three times a day (TID) | ORAL | 5 refills | Status: DC | PRN

## 2015-12-28 MED ORDER — HYDROCODONE-ACETAMINOPHEN 7.5-325 MG PO TABS: 1.0000 | ORAL_TABLET | ORAL | 0 refills | Status: DC | PRN

## 2015-12-28 NOTE — Progress Notes (Signed)
Patient ZO:[name], male DOB:Jul 08, 1980, 35 y.o. VWU:[id number]  Chief Complaint  Patient presents with  . Follow-up    right ankle pain    HPI  [name] is a 35 y.o. male who has chronic pain of the right ankle.  It still swells and hurts.  He has no redness.  He has no numbness.  He uses soaks and takes his medicine.  He has no new trauma. HPI  Body mass index is 46.96 kg/m.  ROS  Review of Systems  Constitutional:       Patient does not have Diabetes Mellitus. Patient has hypertension. Patient does have COPD or shortness of breath. Patient has BMI > 35. Patient has current smoking history.   Respiratory: Positive for cough, shortness of breath and wheezing.        Sleep apnea  Gastrointestinal: Positive for nausea and vomiting.       GERD  Neurological: Negative for numbness.  Psychiatric/Behavioral: Positive for sleep disturbance. The patient is nervous/anxious.     Past Medical History:  Diagnosis Date  . Anxiety   . Bipolar 2 disorder (HCC)   . Chronic abdominal pain    HIDA scan Sept 2010, EF 93%, s/p chole oct 2010; evaluated at [facility] for abdominal wall pain; Imipramine added May 2011  . Chronic vomiting    normal GES 2009  . Depression    increased over past several months  . GERD (gastroesophageal reflux disease)    Bravo study Nov 2009 on Prilosec 40 BID with adequate acid suppression  . Hiatal hernia   . Hypertension   . S/P endoscopy    2008 Dr. Karilyn Cota: erosive reflux esophagitis, antral gastritis, H.pylori serologies neg, Nov 2009 Dr. Darrick Penna: gastritis, benign esophageal polyp, no H.pylori,  Feb 2011: [facility], Dr. Bubba Hales: normal esophagus, normal stomach, normal duodenum, path unremarkable  . Shortness of breath    SOB even with no exertion  . Sleep apnea    supposed to sleep with CPAP  . Ventral hernia     Past Surgical History:  Procedure Laterality Date  . APPENDECTOMY  5/10  . BIOPSY  08/15/2015   Procedure: BIOPSY;   Surgeon: West Bali, MD;  [facility];  Service: Endoscopy;;  gastric bx  . CHOLECYSTECTOMY  10/10  . ESOPHAGOGASTRODUODENOSCOPY  05/2006   H.pylori neg  . ESOPHAGOGASTRODUODENOSCOPY  03/23/2008   Fields-gastritis benign esophageal polyp, otherwise normal. Negative H. pylori he (propofol)  . ESOPHAGOGASTRODUODENOSCOPY (EGD) WITH PROPOFOL N/A 08/15/2015   Procedure: ESOPHAGOGASTRODUODENOSCOPY (EGD) WITH PROPOFOL;  Surgeon: West Bali, MD;  Location: [facility];  Service: Endoscopy;  Laterality: N/A;  2956  . INCISIONAL HERNIA REPAIR  03/04/2012   Procedure: LAPAROSCOPIC INCISIONAL HERNIA;  Surgeon: Dalia Heading, MD;  Location: [facility];  Service: General;  Laterality: N/A;  repair, recurrent  . INCISIONAL HERNIA REPAIR N/A 01/12/2014   Procedure: HERNIA REPAIR INCISIONAL WITH MESH;  Surgeon: Dalia Heading, MD;  Location: [facility];  Service: General;  Laterality: N/A;  . INGUINAL HERNIA REPAIR     child  . INSERTION OF MESH  03/04/2012   Procedure: INSERTION OF MESH;  Surgeon: Dalia Heading, MD;  Location: [facility];  Service: General;  Laterality: N/A;  . INSERTION OF MESH N/A 01/12/2014   Procedure: INSERTION OF MESH;  Surgeon: Dalia Heading, MD;  Location: [facility];  Service: General;  Laterality: N/A;  . KNEE SURGERY Right 2009  . VENTRAL HERNIA REPAIR  March 2012  Dr. Lovell Sheehan    Family History  Problem Relation Age of Onset  . Colon cancer Neg Hx   . Diabetes Mother     Social History Social History  Substance Use Topics  . Smoking status: Current Every Day Smoker    Packs/day: 1.00    Years: 15.00    Types: Cigarettes  . Smokeless tobacco: Never Used     Comment: Less than 1/2 pk a day. trying to quit.  03/04/12 1255 states "was doing good, but now up to about a pack per day"  . Alcohol use No    Allergies  Allergen Reactions  . Dexilant [Dexlansoprazole] Other (See Comments)    Pass out  . Propoxyphene N-Acetaminophen Other (See Comments)    Unknown    . Zofran Odt [Ondansetron]     vomit  . Zofran [Ondansetron Hcl] Nausea And Vomiting    Current Outpatient Prescriptions  Medication Sig Dispense Refill  . albuterol (ACCUNEB) 0.63 MG/3ML nebulizer solution 0.63 mg 4 (four) times daily as needed.    . ALPRAZolam (XANAX) 1 MG tablet Take 1 mg by mouth 4 (four) times daily.     . Brexpiprazole (REXULTI) 2 MG TABS Take 2 mg by mouth daily.    . Cariprazine HCl (VRAYLAR) 3 MG CAPS Take 3 mg by mouth at bedtime.    . fluticasone (FLONASE) 50 MCG/ACT nasal spray Place 2 sprays into both nostrils daily.    Marland Kitchen HYDROcodone-acetaminophen (NORCO) 7.5-325 MG tablet Take 1 tablet by mouth every 4 (four) hours as needed for moderate pain (Must last 30 days.  Do not drive or operate machinery while taking this medicine.). 120 tablet 0  . ibuprofen (ADVIL,MOTRIN) 800 MG tablet Take 1 tablet (800 mg total) by mouth every 8 (eight) hours as needed. 100 tablet 5  . ipratropium (ATROVENT HFA) 17 MCG/ACT inhaler Inhale 2 puffs into the lungs every 6 (six) hours.    Marland Kitchen lisinopril (PRINIVIL,ZESTRIL) 40 MG tablet Take 1 tablet (40 mg total) by mouth daily. 30 tablet 1  . omeprazole (PRILOSEC) 40 MG capsule Take 40 mg by mouth 2 (two) times daily.    . promethazine (PHENERGAN) 25 MG tablet Take 1 tablet (25 mg total) by mouth every 8 (eight) hours as needed for nausea or vomiting. 30 tablet 0  . topiramate (TOPAMAX) 100 MG tablet Take 100 mg by mouth at bedtime.     . Vilazodone HCl (VIIBRYD) 20 MG TABS Take 20 mg by mouth daily.     No current facility-administered medications for this visit.      Physical Exam  Blood pressure 132/84, pulse 87, height 5\' 9"  (1.753 m), weight (!) 318 lb (144.2 kg).  Constitutional: overall normal hygiene, normal nutrition, well developed, normal grooming, normal body habitus. Assistive device:none  Musculoskeletal: gait and station Limp right, muscle tone and strength are normal, no tremors or atrophy is present.  .   Neurological: coordination overall normal.  Deep tendon reflex/nerve stretch intact.  Sensation normal.  Cranial nerves II-XII intact.   Skin:   normal overall no scars, lesions, ulcers or rashes. No psoriasis.  Psychiatric: Alert and oriented x 3.  Recent memory intact, remote memory unclear.  Normal mood and affect. Well groomed.  Good eye contact.  Cardiovascular: overall no swelling, no varicosities, no edema bilaterally, normal temperatures of the legs and arms, no clubbing, cyanosis and good capillary refill.  Lymphatic: palpation is normal.  His right lateral ankle has swelling and pain.  He is tender  over the anterior talofibular ligament.  NV intact. ROM is full.  He limps to the right.  Left ankle negative.   The patient has been educated about the nature of the problem(s) and counseled on treatment options.  The patient appeared to understand what I have discussed and is in agreement with it.  Encounter Diagnoses  Name Primary?  . Pain in joint, ankle and foot, right Yes  . Morbid obesity due to excess calories (HCC)   . Smoker unmotivated to quit     PLAN Call if any problems.  Precautions discussed.  Continue current medications.   Return to clinic 3 months   Electronically Signed Darreld McleanWayne Inella Kuwahara, MD 8/24/20178:33 AM

## 2015-12-29 LAB — ANTI-MITOCHONDRIAL/ANTI-SMOOTH MUSCLE PANEL

## 2015-12-29 LAB — CERULOPLASMIN: Ceruloplasmin: 26 mg/dL (ref 18–36)

## 2015-12-29 LAB — ALPHA-1-ANTITRYPSIN: A1 ANTITRYPSIN SER: 81 mg/dL — AB (ref 83–199)

## 2016-01-02 ENCOUNTER — Ambulatory Visit: Payer: Medicaid Other | Admitting: Orthopaedic Surgery

## 2016-01-09 ENCOUNTER — Ambulatory Visit: Payer: Medicaid Other | Admitting: Orthopaedic Surgery

## 2016-01-11 ENCOUNTER — Encounter: Payer: Self-pay | Admitting: Orthopaedic Surgery

## 2016-01-11 ENCOUNTER — Ambulatory Visit (INDEPENDENT_AMBULATORY_CARE_PROVIDER_SITE_OTHER): Payer: Medicaid Other

## 2016-01-11 ENCOUNTER — Ambulatory Visit (INDEPENDENT_AMBULATORY_CARE_PROVIDER_SITE_OTHER): Payer: Medicaid Other | Admitting: Orthopaedic Surgery

## 2016-01-11 VITALS — BP 145/90 | HR 80 | Temp 97.7°F | Ht 69.0 in | Wt 302.0 lb

## 2016-01-11 DIAGNOSIS — M25531 Pain in right wrist: Secondary | ICD-10-CM

## 2016-01-11 DIAGNOSIS — M778 Other enthesopathies, not elsewhere classified: Secondary | ICD-10-CM | POA: Diagnosis not present

## 2016-01-11 DIAGNOSIS — F17200 Nicotine dependence, unspecified, uncomplicated: Secondary | ICD-10-CM

## 2016-01-11 DIAGNOSIS — F17210 Nicotine dependence, cigarettes, uncomplicated: Secondary | ICD-10-CM

## 2016-01-11 MED ORDER — PREDNISONE 5 MG (21) PO TBPK: ORAL_TABLET | ORAL | 0 refills | Status: DC

## 2016-01-11 NOTE — Progress Notes (Signed)
Patient ZO:XWRUEA:[name], male DOB:24-Mar-1981, 35 y.o. VWU:[id number]  Chief Complaint  Patient presents with  . Arm Injury    right forearm injury s/p fall 10/06/15    HPI  [name] is a 35 y.o. male who has a new problem:  Right wrist pain.  [facility].  He fell back and hurt his right wrist.  He has had pain in the area of the radial side of the hand, more dorsally.  He has pain with use of the thumb and lifting objects.  He has worn a cock-up splint for about six weeks, taken Advil, used ice, heat and it still hurts. HPI  Body mass index is 44.6 kg/m.  ROS  Review of Systems  Constitutional:       Patient does not have Diabetes Mellitus. Patient has hypertension. Patient does have COPD or shortness of breath. Patient has BMI > 35. Patient has current smoking history.   Respiratory: Positive for cough, shortness of breath and wheezing.        Sleep apnea  Gastrointestinal: Positive for nausea and vomiting.       GERD  Neurological: Negative for numbness.  Psychiatric/Behavioral: Positive for sleep disturbance. The patient is nervous/anxious.     Past Medical History:  Diagnosis Date  . Anxiety   . Bipolar 2 disorder (HCC)   . Chronic abdominal pain    HIDA scan Sept 2010, EF 93%, s/p chole oct 2010; [facility]; Imipramine added May 2011  . Chronic vomiting    normal GES 2009  . Depression    increased over past several months  . GERD (gastroesophageal reflux disease)    Bravo study Nov 2009 on Prilosec 40 BID with adequate acid suppression  . Hiatal hernia   . Hypertension   . S/P endoscopy    2008 Dr. Karilyn Cotaehman: erosive reflux esophagitis, antral gastritis, H.pylori serologies neg, Nov 2009 Dr. Darrick PennaFields: gastritis, benign esophageal polyp, no H.pylori,  Feb 2011: [facility], Dr. Bubba HalesBurggen: normal esophagus, normal stomach, normal duodenum, path unremarkable  . Shortness of breath    SOB even with no  exertion  . Sleep apnea    supposed to sleep with CPAP  . Ventral hernia     Past Surgical History:  Procedure Laterality Date  . APPENDECTOMY  5/10  . BIOPSY  08/15/2015   Procedure: BIOPSY;  Surgeon: West BaliSandi L Fields, MD;  [facility];  Service: Endoscopy;;  gastric bx  . CHOLECYSTECTOMY  10/10  . ESOPHAGOGASTRODUODENOSCOPY  05/2006   H.pylori neg  . ESOPHAGOGASTRODUODENOSCOPY  03/23/2008   Fields-gastritis benign esophageal polyp, otherwise normal. Negative H. pylori he (propofol)  . ESOPHAGOGASTRODUODENOSCOPY (EGD) WITH PROPOFOL N/A 08/15/2015   Procedure: ESOPHAGOGASTRODUODENOSCOPY (EGD) WITH PROPOFOL;  Surgeon: West BaliSandi L Fields, MD;  [facility];  Service: Endoscopy;  Laterality: N/A;  2956;  0815  . INCISIONAL HERNIA REPAIR  03/04/2012   Procedure: LAPAROSCOPIC INCISIONAL HERNIA;  Surgeon: Dalia HeadingMark A Jenkins, MD;  Location: [facility];  Service: General;  Laterality: N/A;  repair, recurrent  . INCISIONAL HERNIA REPAIR N/A 01/12/2014   Procedure: HERNIA REPAIR INCISIONAL WITH MESH;  Surgeon: Dalia HeadingMark A Jenkins, MD;  Location: [facility];  Service: General;  Laterality: N/A;  . INGUINAL HERNIA REPAIR     child  . INSERTION OF MESH  03/04/2012   Procedure: INSERTION OF MESH;  Surgeon: Dalia HeadingMark A Jenkins, MD;  Location: [facility];  Service: General;  Laterality: N/A;  .  INSERTION OF MESH N/A 01/12/2014   Procedure: INSERTION OF MESH;  Surgeon: Dalia Heading, MD;  Location: [facility];  Service: General;  Laterality: N/A;  . KNEE SURGERY Right 2009  . VENTRAL HERNIA REPAIR  March 2012   Dr. Lovell Sheehan    Family History  Problem Relation Age of Onset  . Colon cancer Neg Hx   . Diabetes Mother     Social History Social History  Substance Use Topics  . Smoking status: Current Every Day Smoker    Packs/day: 1.00    Years: 15.00    Types: Cigarettes  . Smokeless tobacco: Never Used     Comment: Less than 1/2 pk a day. trying to quit.  03/04/12 1255 states "was doing good, but now up to about a  pack per day"  . Alcohol use No    Allergies  Allergen Reactions  . Dexilant [Dexlansoprazole] Other (See Comments)    Pass out  . Propoxyphene N-Acetaminophen Other (See Comments)    Unknown   . Zofran Odt [Ondansetron]     vomit  . Zofran [Ondansetron Hcl] Nausea And Vomiting    Current Outpatient Prescriptions  Medication Sig Dispense Refill  . albuterol (ACCUNEB) 0.63 MG/3ML nebulizer solution 0.63 mg 4 (four) times daily as needed.    . ALPRAZolam (XANAX) 1 MG tablet Take 1 mg by mouth 4 (four) times daily.     . Brexpiprazole (REXULTI) 2 MG TABS Take 2 mg by mouth daily.    . Cariprazine HCl (VRAYLAR) 3 MG CAPS Take 3 mg by mouth at bedtime.    . fluticasone (FLONASE) 50 MCG/ACT nasal spray Place 2 sprays into both nostrils daily.    Marland Kitchen HYDROcodone-acetaminophen (NORCO) 7.5-325 MG tablet Take 1 tablet by mouth every 4 (four) hours as needed for moderate pain (Must last 30 days.  Do not drive or operate machinery while taking this medicine.). 120 tablet 0  . ibuprofen (ADVIL,MOTRIN) 800 MG tablet Take 1 tablet (800 mg total) by mouth every 8 (eight) hours as needed. 100 tablet 5  . ipratropium (ATROVENT HFA) 17 MCG/ACT inhaler Inhale 2 puffs into the lungs every 6 (six) hours.    Marland Kitchen lisinopril (PRINIVIL,ZESTRIL) 40 MG tablet Take 1 tablet (40 mg total) by mouth daily. 30 tablet 1  . omeprazole (PRILOSEC) 40 MG capsule Take 40 mg by mouth 2 (two) times daily.    . promethazine (PHENERGAN) 25 MG tablet Take 1 tablet (25 mg total) by mouth every 8 (eight) hours as needed for nausea or vomiting. 30 tablet 0  . topiramate (TOPAMAX) 100 MG tablet Take 100 mg by mouth at bedtime.     . Vilazodone HCl (VIIBRYD) 20 MG TABS Take 20 mg by mouth daily.     No current facility-administered medications for this visit.      Physical Exam  Blood pressure (!) 145/90, pulse 80, temperature 97.7 F (36.5 C), height 5\' 9"  (1.753 m), weight (!) 302 lb (137 kg).  Constitutional: overall normal  hygiene, normal nutrition, well developed, normal grooming, normal body habitus. Assistive device:cock-up splint  Musculoskeletal: gait and station Limp right, muscle tone and strength are normal, no tremors or atrophy is present.  .  Neurological: coordination overall normal.  Deep tendon reflex/nerve stretch intact.  Sensation normal.  Cranial nerves II-XII intact.   Skin:   Normal overall no scars, lesions, ulcers or rashes. No psoriasis.  Psychiatric: Alert and oriented x 3.  Recent memory intact, remote memory unclear.  Normal mood  and affect. Well groomed.  Good eye contact.  Cardiovascular: overall no swelling, no varicosities, no edema bilaterally, normal temperatures of the legs and arms, no clubbing, cyanosis and good capillary refill.  Lymphatic: palpation is normal.  He has pain over the first and second compartments extensors of the right dorsal wrist.  He has positive Lourena Simmonds sign for the thumb.  He has no redness, no swelling.  NV intact.  He is not willing to quit smoking.  I have talked again with him.  The patient has been educated about the nature of the problem(s) and counseled on treatment options.  The patient appeared to understand what I have discussed and is in agreement with it.  Encounter Diagnoses  Name Primary?  . Right wrist pain Yes  . Smoker unmotivated to quit   . Tendinitis of right wrist     PLAN Call if any problems.  Precautions discussed.  Continue current medications.   Return to clinic 2 weeks I will call in prednisone dose pack.  If not better in two weeks, consider injection.  Electronically Signed Darreld Mclean, MD 9/7/20178:50 AM

## 2016-01-11 NOTE — Patient Instructions (Signed)
Smoking Cessation, Tips for Success If you are ready to quit smoking, congratulations! You have chosen to help yourself be healthier. Cigarettes bring nicotine, tar, carbon monoxide, and other irritants into your body. Your lungs, heart, and blood vessels will be able to work better without these poisons. There are many different ways to quit smoking. Nicotine gum, nicotine patches, a nicotine inhaler, or nicotine nasal spray can help with physical craving. Hypnosis, support groups, and medicines help break the habit of smoking. WHAT THINGS CAN I DO TO MAKE QUITTING EASIER?  Here are some tips to help you quit for good:  Pick a date when you will quit smoking completely. Tell all of your friends and family about your plan to quit on that date.  Do not try to slowly cut down on the number of cigarettes you are smoking. Pick a quit date and quit smoking completely starting on that day.  Throw away all cigarettes.   Clean and remove all ashtrays from your home, work, and car.  On a card, write down your reasons for quitting. Carry the card with you and read it when you get the urge to smoke.  Cleanse your body of nicotine. Drink enough water and fluids to keep your urine clear or pale yellow. Do this after quitting to flush the nicotine from your body.  Learn to predict your moods. Do not let a bad situation be your excuse to have a cigarette. Some situations in your life might tempt you into wanting a cigarette.  Never have "just one" cigarette. It leads to wanting another and another. Remind yourself of your decision to quit.  Change habits associated with smoking. If you smoked while driving or when feeling stressed, try other activities to replace smoking. Stand up when drinking your coffee. Brush your teeth after eating. Sit in a different chair when you read the paper. Avoid alcohol while trying to quit, and try to drink fewer caffeinated beverages. Alcohol and caffeine may urge you to  smoke.  Avoid foods and drinks that can trigger a desire to smoke, such as sugary or spicy foods and alcohol.  Ask people who smoke not to smoke around you.  Have something planned to do right after eating or having a cup of coffee. For example, plan to take a walk or exercise.  Try a relaxation exercise to calm you down and decrease your stress. Remember, you may be tense and nervous for the first 2 weeks after you quit, but this will pass.  Find new activities to keep your hands busy. Play with a pen, coin, or rubber band. Doodle or draw things on paper.  Brush your teeth right after eating. This will help cut down on the craving for the taste of tobacco after meals. You can also try mouthwash.   Use oral substitutes in place of cigarettes. Try using lemon drops, carrots, cinnamon sticks, or chewing gum. Keep them handy so they are available when you have the urge to smoke.  When you have the urge to smoke, try deep breathing.  Designate your home as a nonsmoking area.  If you are a heavy smoker, ask your health care provider about a prescription for nicotine chewing gum. It can ease your withdrawal from nicotine.  Reward yourself. Set aside the cigarette money you save and buy yourself something nice.  Look for support from others. Join a support group or smoking cessation program. Ask someone at home or at work to help you with your plan   to quit smoking.  Always ask yourself, "Do I need this cigarette or is this just a reflex?" Tell yourself, "Today, I choose not to smoke," or "I do not want to smoke." You are reminding yourself of your decision to quit.  Do not replace cigarette smoking with electronic cigarettes (commonly called e-cigarettes). The safety of e-cigarettes is unknown, and some may contain harmful chemicals.  If you relapse, do not give up! Plan ahead and think about what you will do the next time you get the urge to smoke. HOW WILL I FEEL WHEN I QUIT SMOKING? You  may have symptoms of withdrawal because your body is used to nicotine (the addictive substance in cigarettes). You may crave cigarettes, be irritable, feel very hungry, cough often, get headaches, or have difficulty concentrating. The withdrawal symptoms are only temporary. They are strongest when you first quit but will go away within 10-14 days. When withdrawal symptoms occur, stay in control. Think about your reasons for quitting. Remind yourself that these are signs that your body is healing and getting used to being without cigarettes. Remember that withdrawal symptoms are easier to treat than the major diseases that smoking can cause.  Even after the withdrawal is over, expect periodic urges to smoke. However, these cravings are generally short lived and will go away whether you smoke or not. Do not smoke! WHAT RESOURCES ARE AVAILABLE TO HELP ME QUIT SMOKING? Your health care provider can direct you to community resources or hospitals for support, which may include:  Group support.  Education.  Hypnosis.  Therapy.   This information is not intended to replace advice given to you by your health care provider. Make sure you discuss any questions you have with your health care provider.   Document Released: 01/19/2004 Document Revised: 05/13/2014 Document Reviewed: 10/08/2012 Elsevier Interactive Patient Education 2016 [employer] Inc.  

## 2016-01-18 NOTE — Progress Notes (Signed)
Please let patient know that his LFTs are now normal.  He did have one test that was abnormal, alpha-1-antitrypsin. This could be seen with liver and lung disease.  I would recommend he have alpha-1-antitrypsin and alpha-1-antitrypsin genetic mutation analysis done. It will have to be on a NEW blood draw.

## 2016-01-19 ENCOUNTER — Inpatient Hospital Stay (HOSPITAL_COMMUNITY)
Admission: EM | Admit: 2016-01-19 | Discharge: 2016-01-20 | DRG: 083 | Disposition: A | Discharge status: Home / Self Care | Payer: Medicaid Other | Attending: Neurological Surgery | Admitting: Neurological Surgery

## 2016-01-19 ENCOUNTER — Emergency Department (HOSPITAL_COMMUNITY): Payer: Medicaid Other

## 2016-01-19 ENCOUNTER — Encounter (HOSPITAL_COMMUNITY): Payer: Self-pay | Admitting: Emergency Medicine

## 2016-01-19 DIAGNOSIS — F419 Anxiety disorder, unspecified: Secondary | ICD-10-CM | POA: Diagnosis not present

## 2016-01-19 DIAGNOSIS — S0240EA Zygomatic fracture, right side, initial encounter for closed fracture: Secondary | ICD-10-CM | POA: Diagnosis present

## 2016-01-19 DIAGNOSIS — I10 Essential (primary) hypertension: Secondary | ICD-10-CM | POA: Diagnosis present

## 2016-01-19 DIAGNOSIS — S0291XA Unspecified fracture of skull, initial encounter for closed fracture: Secondary | ICD-10-CM

## 2016-01-19 DIAGNOSIS — S064X9A Epidural hemorrhage with loss of consciousness of unspecified duration, initial encounter: Principal | ICD-10-CM | POA: Diagnosis present

## 2016-01-19 DIAGNOSIS — J449 Chronic obstructive pulmonary disease, unspecified: Secondary | ICD-10-CM | POA: Diagnosis present

## 2016-01-19 DIAGNOSIS — K219 Gastro-esophageal reflux disease without esophagitis: Secondary | ICD-10-CM | POA: Diagnosis not present

## 2016-01-19 DIAGNOSIS — F17210 Nicotine dependence, cigarettes, uncomplicated: Secondary | ICD-10-CM | POA: Diagnosis not present

## 2016-01-19 DIAGNOSIS — Z79899 Other long term (current) drug therapy: Secondary | ICD-10-CM | POA: Diagnosis not present

## 2016-01-19 DIAGNOSIS — S0219XA Other fracture of base of skull, initial encounter for closed fracture: Secondary | ICD-10-CM | POA: Diagnosis present

## 2016-01-19 DIAGNOSIS — S0101XA Laceration without foreign body of scalp, initial encounter: Secondary | ICD-10-CM | POA: Diagnosis not present

## 2016-01-19 DIAGNOSIS — Z888 Allergy status to other drugs, medicaments and biological substances status: Secondary | ICD-10-CM | POA: Diagnosis not present

## 2016-01-19 DIAGNOSIS — F3181 Bipolar II disorder: Secondary | ICD-10-CM | POA: Diagnosis present

## 2016-01-19 DIAGNOSIS — I629 Nontraumatic intracranial hemorrhage, unspecified: Secondary | ICD-10-CM

## 2016-01-19 DIAGNOSIS — T1490XA Injury, unspecified, initial encounter: Secondary | ICD-10-CM

## 2016-01-19 LAB — ETHANOL: Alcohol, Ethyl (B): <5 mg/dL (ref <5)

## 2016-01-19 LAB — CBC WITH DIFFERENTIAL/PLATELET
BASOS ABS: 0 10*3/uL (ref 0.0–0.1)
BASOS PCT: 0 %
EOS ABS: 0.2 10*3/uL (ref 0.0–0.7)
Eosinophils Relative: 1 %
HCT: 41.3 % (ref 39.0–52.0)
HEMOGLOBIN: 13.6 g/dL (ref 13.0–17.0)
Lymphocytes Relative: 10 %
Lymphs Abs: 1.3 10*3/uL (ref 0.7–4.0)
MCH: 30.8 pg (ref 26.0–34.0)
MCHC: 32.9 g/dL (ref 30.0–36.0)
MCV: 93.7 fL (ref 78.0–100.0)
MONOS PCT: 6 %
Monocytes Absolute: 0.8 10*3/uL (ref 0.1–1.0)
NEUTROS ABS: 11 10*3/uL — AB (ref 1.7–7.7)
NEUTROS PCT: 83 %
Platelets: 182 10*3/uL (ref 150–400)
RBC: 4.41 MIL/uL (ref 4.22–5.81)
RDW: 13.2 % (ref 11.5–15.5)
WBC: 13.3 10*3/uL — ABNORMAL HIGH (ref 4.0–10.5)

## 2016-01-19 LAB — BASIC METABOLIC PANEL
ANION GAP: 9 (ref 5–15)
BUN: 15 mg/dL (ref 6–20)
CHLORIDE: 103 mmol/L (ref 101–111)
CO2: 27 mmol/L (ref 22–32)
CREATININE: 0.97 mg/dL (ref 0.61–1.24)
Calcium: 9 mg/dL (ref 8.9–10.3)
GFR calc non Af Amer: >60 mL/min (ref >60)
Glucose, Bld: 111 mg/dL — ABNORMAL HIGH (ref 65–99)
Potassium: 3.8 mmol/L (ref 3.5–5.1)
SODIUM: 139 mmol/L (ref 135–145)

## 2016-01-19 MED ORDER — SODIUM CHLORIDE 0.9 % IV SOLN: 1000.0000 mL | INTRAVENOUS | Status: DC

## 2016-01-19 MED ORDER — MORPHINE SULFATE (PF) 4 MG/ML IV SOLN
6.0000 mg | Freq: Once | INTRAVENOUS | Status: AC
Start: 2016-01-19 — End: 2016-01-19
  Administered 2016-01-19: 6 mg via INTRAVENOUS
  Filled 2016-01-19: qty 2

## 2016-01-19 MED ORDER — MORPHINE SULFATE (PF) 2 MG/ML IV SOLN
2.0000 mg | Freq: Once | INTRAVENOUS | Status: AC
  Administered 2016-01-19: 2 mg via INTRAVENOUS
  Filled 2016-01-19: qty 1

## 2016-01-19 MED ORDER — SODIUM CHLORIDE 0.9 % IV SOLN: 1000.0000 mL | Freq: Once | INTRAVENOUS | Status: DC

## 2016-01-19 MED ORDER — MORPHINE SULFATE (PF) 4 MG/ML IV SOLN
6.0000 mg | Freq: Once | INTRAVENOUS | Status: AC
  Administered 2016-01-19: 6 mg via INTRAVENOUS
  Filled 2016-01-19: qty 2
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
## 2016-01-19 NOTE — ED Notes (Signed)
Ice pack applied to right hematoma.
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
## 2016-01-20 ENCOUNTER — Emergency Department (HOSPITAL_COMMUNITY): Payer: Medicaid Other

## 2016-01-20 ENCOUNTER — Inpatient Hospital Stay (HOSPITAL_COMMUNITY): Payer: Medicaid Other

## 2016-01-20 DIAGNOSIS — F419 Anxiety disorder, unspecified: Secondary | ICD-10-CM | POA: Diagnosis present

## 2016-01-20 DIAGNOSIS — S064X9A Epidural hemorrhage with loss of consciousness of unspecified duration, initial encounter: Secondary | ICD-10-CM | POA: Diagnosis present

## 2016-01-20 DIAGNOSIS — I629 Nontraumatic intracranial hemorrhage, unspecified: Secondary | ICD-10-CM

## 2016-01-20 DIAGNOSIS — K219 Gastro-esophageal reflux disease without esophagitis: Secondary | ICD-10-CM | POA: Diagnosis present

## 2016-01-20 DIAGNOSIS — J449 Chronic obstructive pulmonary disease, unspecified: Secondary | ICD-10-CM | POA: Diagnosis present

## 2016-01-20 DIAGNOSIS — S0219XA Other fracture of base of skull, initial encounter for closed fracture: Secondary | ICD-10-CM | POA: Diagnosis present

## 2016-01-20 DIAGNOSIS — S0101XA Laceration without foreign body of scalp, initial encounter: Secondary | ICD-10-CM | POA: Diagnosis present

## 2016-01-20 DIAGNOSIS — I10 Essential (primary) hypertension: Secondary | ICD-10-CM | POA: Diagnosis present

## 2016-01-20 DIAGNOSIS — Z79899 Other long term (current) drug therapy: Secondary | ICD-10-CM | POA: Diagnosis not present

## 2016-01-20 DIAGNOSIS — F17210 Nicotine dependence, cigarettes, uncomplicated: Secondary | ICD-10-CM | POA: Diagnosis present

## 2016-01-20 DIAGNOSIS — F3181 Bipolar II disorder: Secondary | ICD-10-CM | POA: Diagnosis present

## 2016-01-20 DIAGNOSIS — Z888 Allergy status to other drugs, medicaments and biological substances status: Secondary | ICD-10-CM | POA: Diagnosis not present

## 2016-01-20 DIAGNOSIS — S0240EA Zygomatic fracture, right side, initial encounter for closed fracture: Secondary | ICD-10-CM | POA: Diagnosis present

## 2016-01-20 LAB — MRSA PCR SCREENING: MRSA BY PCR: NEGATIVE

## 2016-01-20 MED ORDER — MORPHINE SULFATE (PF) 4 MG/ML IV SOLN
6.0000 mg | INTRAVENOUS | Status: DC | PRN
  Administered 2016-01-20 (×3): 6 mg via INTRAVENOUS
  Filled 2016-01-20 (×3): qty 2

## 2016-01-20 MED ORDER — HYDROMORPHONE HCL 1 MG/ML IJ SOLN
0.5000 mg | INTRAMUSCULAR | Status: DC | PRN
  Administered 2016-01-20 (×2): 0.5 mg via INTRAVENOUS
  Filled 2016-01-20 (×2): qty 1

## 2016-01-20 MED ORDER — OXYCODONE HCL 5 MG PO TABS
5.0000 mg | ORAL_TABLET | Freq: Four times a day (QID) | ORAL | Status: DC | PRN
  Administered 2016-01-20 (×2): 5 mg via ORAL
  Filled 2016-01-20 (×2): qty 1
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
## 2016-01-20 NOTE — ED Notes (Signed)
Patient transported to CT 
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
## 2016-01-20 NOTE — ED Notes (Signed)
Patient sat up on side of the bed to use the urinal.  No c/o dizziness

## 2016-01-20 NOTE — Discharge Summary (Signed)
Date of Admission: 01/19/2016  Date of Discharge: 01/20/16  Admission Diagnosis: Mild traumatic brain injury, epidural hematoma  Discharge Diagnosis: Same  Procedure Performed: None  Attending: Loura Halt Jaice Lague, MD  Hospital Course:  The patient was admitted for observation for the above listed condition.  He had an uncomplicated hospital course and his imaging was stable.  He was discharged in stable condition.  Follow up: 3 weeks    Medication List    TAKE these medications   albuterol 0.63 MG/3ML nebulizer solution Commonly known as:  ACCUNEB 0.63 mg 4 (four) times daily as needed.   ALPRAZolam 1 MG tablet Commonly known as:  XANAX Take 1 mg by mouth 4 (four) times daily.   fluticasone 50 MCG/ACT nasal spray Commonly known as:  FLONASE Place 2 sprays into both nostrils daily.   HYDROcodone-acetaminophen 7.5-325 MG tablet Commonly known as:  NORCO Take 1 tablet by mouth every 4 (four) hours as needed for moderate pain (Must last 30 days.  Do not drive or operate machinery while taking this medicine.).   ibuprofen 800 MG tablet Commonly known as:  ADVIL,MOTRIN Take 1 tablet (800 mg total) by mouth every 8 (eight) hours as needed.   ipratropium 17 MCG/ACT inhaler Commonly known as:  ATROVENT HFA Inhale 2 puffs into the lungs every 6 (six) hours as needed for wheezing.   omeprazole 40 MG capsule Commonly known as:  PRILOSEC Take 40 mg by mouth 2 (two) times daily.   predniSONE 5 MG (21) Tbpk tablet Commonly known as:  STERAPRED UNI-PAK 21 TAB Take 6 pills first day; 5 pills second day; 4 pills third day; 3 pills fourth day; 2 pills next day and 1 pill last day.   promethazine 25 MG tablet Commonly known as:  PHENERGAN Take 1 tablet (25 mg total) by mouth every 8 (eight) hours as needed for nausea or vomiting.   REXULTI 2 MG Tabs Generic drug:  Brexpiprazole Take 2 mg by mouth daily.   topiramate 100 MG tablet Commonly known as:  TOPAMAX Take 100 mg by  mouth at bedtime.   VIIBRYD 20 MG Tabs Generic drug:  Vilazodone HCl Take 20 mg by mouth daily.   VRAYLAR 3 MG Caps Generic drug:  Cariprazine HCl Take 3 mg by mouth at bedtime.

## 2016-01-22 ENCOUNTER — Emergency Department (HOSPITAL_COMMUNITY): Payer: Medicaid Other

## 2016-01-22 ENCOUNTER — Encounter (HOSPITAL_COMMUNITY): Payer: Self-pay | Admitting: Emergency Medicine

## 2016-01-22 ENCOUNTER — Emergency Department (HOSPITAL_COMMUNITY)
Admission: EM | Admit: 2016-01-22 | Discharge: 2016-01-22 | Disposition: A | Discharge status: Home / Self Care | Payer: Medicaid Other | Attending: Emergency Medicine | Admitting: Emergency Medicine

## 2016-01-22 DIAGNOSIS — S0240ED Zygomatic fracture, right side, subsequent encounter for fracture with routine healing: Secondary | ICD-10-CM | POA: Insufficient documentation

## 2016-01-22 DIAGNOSIS — Z79899 Other long term (current) drug therapy: Secondary | ICD-10-CM | POA: Insufficient documentation

## 2016-01-22 DIAGNOSIS — Z791 Long term (current) use of non-steroidal anti-inflammatories (NSAID): Secondary | ICD-10-CM | POA: Insufficient documentation

## 2016-01-22 DIAGNOSIS — J45901 Unspecified asthma with (acute) exacerbation: Secondary | ICD-10-CM | POA: Insufficient documentation

## 2016-01-22 DIAGNOSIS — S064X0D Epidural hemorrhage without loss of consciousness, subsequent encounter: Secondary | ICD-10-CM

## 2016-01-22 DIAGNOSIS — S0990XD Unspecified injury of head, subsequent encounter: Secondary | ICD-10-CM

## 2016-01-22 DIAGNOSIS — I10 Essential (primary) hypertension: Secondary | ICD-10-CM | POA: Diagnosis not present

## 2016-01-22 DIAGNOSIS — S02402D Zygomatic fracture, unspecified, subsequent encounter for fracture with routine healing: Secondary | ICD-10-CM

## 2016-01-22 DIAGNOSIS — F17210 Nicotine dependence, cigarettes, uncomplicated: Secondary | ICD-10-CM | POA: Diagnosis not present

## 2016-01-22 HISTORY — DX: Unspecified fracture of skull, initial encounter for closed fracture: S02.91XA

## 2016-01-22 LAB — COMPREHENSIVE METABOLIC PANEL
ALK PHOS: 73 U/L (ref 38–126)
ALT: 50 U/L (ref 17–63)
ANION GAP: 5 (ref 5–15)
AST: 20 U/L (ref 15–41)
Albumin: 4 g/dL (ref 3.5–5.0)
BUN: 12 mg/dL (ref 6–20)
CALCIUM: 8.7 mg/dL — AB (ref 8.9–10.3)
CO2: 30 mmol/L (ref 22–32)
CREATININE: 0.96 mg/dL (ref 0.61–1.24)
Chloride: 103 mmol/L (ref 101–111)
Glucose, Bld: 106 mg/dL — ABNORMAL HIGH (ref 65–99)
Potassium: 4 mmol/L (ref 3.5–5.1)
Sodium: 138 mmol/L (ref 135–145)
TOTAL PROTEIN: 6.9 g/dL (ref 6.5–8.1)
Total Bilirubin: 0.6 mg/dL (ref 0.3–1.2)

## 2016-01-22 LAB — CBC WITH DIFFERENTIAL/PLATELET
Basophils Absolute: 0 10*3/uL (ref 0.0–0.1)
Basophils Relative: 0 %
EOS PCT: 4 %
Eosinophils Absolute: 0.3 10*3/uL (ref 0.0–0.7)
HCT: 39.3 % (ref 39.0–52.0)
HEMOGLOBIN: 12.6 g/dL — AB (ref 13.0–17.0)
LYMPHS ABS: 1.8 10*3/uL (ref 0.7–4.0)
LYMPHS PCT: 25 %
MCH: 30.8 pg (ref 26.0–34.0)
MCHC: 32.1 g/dL (ref 30.0–36.0)
MCV: 96.1 fL (ref 78.0–100.0)
MONOS PCT: 6 %
Monocytes Absolute: 0.4 10*3/uL (ref 0.1–1.0)
NEUTROS PCT: 65 %
Neutro Abs: 4.7 10*3/uL (ref 1.7–7.7)
Platelets: 205 10*3/uL (ref 150–400)
RBC: 4.09 MIL/uL — AB (ref 4.22–5.81)
RDW: 13.2 % (ref 11.5–15.5)
WBC: 7.2 10*3/uL (ref 4.0–10.5)

## 2016-01-22 MED ORDER — PREDNISONE 20 MG PO TABS: ORAL_TABLET | ORAL | 0 refills | Status: DC

## 2016-01-22 MED ORDER — ALBUTEROL SULFATE (2.5 MG/3ML) 0.083% IN NEBU
5.0000 mg | INHALATION_SOLUTION | Freq: Once | RESPIRATORY_TRACT | Status: AC
  Administered 2016-01-22: 5 mg via RESPIRATORY_TRACT
  Filled 2016-01-22: qty 6

## 2016-01-22 MED ORDER — METHYLPREDNISOLONE SODIUM SUCC 125 MG IJ SOLR
125.0000 mg | Freq: Once | INTRAMUSCULAR | Status: AC
  Administered 2016-01-22: 125 mg via INTRAMUSCULAR
  Filled 2016-01-22: qty 2
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
## 2016-01-25 ENCOUNTER — Ambulatory Visit (INDEPENDENT_AMBULATORY_CARE_PROVIDER_SITE_OTHER): Payer: Medicaid Other | Admitting: Orthopaedic Surgery

## 2016-01-25 ENCOUNTER — Encounter: Payer: Self-pay | Admitting: Orthopaedic Surgery

## 2016-01-25 VITALS — BP 144/90 | HR 78 | Temp 97.7°F | Ht 67.0 in | Wt 306.0 lb

## 2016-01-25 DIAGNOSIS — M778 Other enthesopathies, not elsewhere classified: Secondary | ICD-10-CM | POA: Diagnosis not present

## 2016-01-25 DIAGNOSIS — M25531 Pain in right wrist: Secondary | ICD-10-CM
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
## 2016-01-30 ENCOUNTER — Telehealth: Payer: Self-pay | Admitting: Orthopaedic Surgery

## 2016-01-30 MED ORDER — HYDROCODONE-ACETAMINOPHEN 7.5-325 MG PO TABS: ORAL_TABLET | ORAL | 0 refills | Status: DC

## 2016-01-30 MED ORDER — HYDROCODONE-ACETAMINOPHEN 7.5-325 MG PO TABS: 1.0000 | ORAL_TABLET | Freq: Four times a day (QID) | ORAL | 0 refills | Status: DC | PRN

## 2016-01-30 NOTE — Telephone Encounter (Signed)
Patient requests refill: HYDROcodone-acetaminophen (NORCO) 7.5-325 MG tablet     -insurance: Medicaid

## 2016-01-30 NOTE — Telephone Encounter (Signed)
Hydrocodone-Acetaminophen  7.5/325mg  Qty 110 Tablets °

## 2016-02-12 ENCOUNTER — Telehealth: Payer: Self-pay | Admitting: Orthopaedic Surgery

## 2016-02-12 MED ORDER — HYDROCODONE-ACETAMINOPHEN 7.5-325 MG PO TABS: ORAL_TABLET | ORAL | 0 refills | Status: DC

## 2016-02-12 NOTE — Telephone Encounter (Signed)
Patient requests a refill  on Hydrocodone/Acetaminophen (Norco)  7.5-325 mgs.   Qty  50

## 2016-02-21 ENCOUNTER — Ambulatory Visit
Admission: RE | Admit: 2016-02-21 | Discharge: 2016-02-21 | Disposition: A | Discharge status: Home / Self Care | Payer: Medicaid Other | Source: Ambulatory Visit | Attending: Orthopaedic Surgery | Admitting: Orthopaedic Surgery

## 2016-02-21 DIAGNOSIS — M778 Other enthesopathies, not elsewhere classified: Secondary | ICD-10-CM

## 2016-02-21 DIAGNOSIS — M25531 Pain in right wrist: Secondary | ICD-10-CM

## 2016-02-27 ENCOUNTER — Ambulatory Visit (INDEPENDENT_AMBULATORY_CARE_PROVIDER_SITE_OTHER): Payer: Medicaid Other | Admitting: Orthopaedic Surgery

## 2016-02-27 ENCOUNTER — Encounter: Payer: Self-pay | Admitting: Orthopaedic Surgery

## 2016-02-27 VITALS — BP 147/93 | HR 82 | Temp 96.8°F | Ht 69.0 in | Wt 303.0 lb

## 2016-02-27 DIAGNOSIS — M778 Other enthesopathies, not elsewhere classified: Secondary | ICD-10-CM | POA: Diagnosis not present

## 2016-02-27 DIAGNOSIS — F17200 Nicotine dependence, unspecified, uncomplicated: Secondary | ICD-10-CM

## 2016-02-27 DIAGNOSIS — M25531 Pain in right wrist: Secondary | ICD-10-CM | POA: Diagnosis not present

## 2016-02-27 MED ORDER — HYDROCODONE-ACETAMINOPHEN 7.5-325 MG PO TABS: ORAL_TABLET | ORAL | 0 refills | Status: DC

## 2016-02-27 MED ORDER — NAPROXEN 500 MG PO TABS: 500.0000 mg | ORAL_TABLET | Freq: Two times a day (BID) | ORAL | 5 refills | Status: DC
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
## 2016-03-11 ENCOUNTER — Telehealth: Payer: Self-pay | Admitting: Orthopaedic Surgery

## 2016-03-11 MED ORDER — HYDROCODONE-ACETAMINOPHEN 7.5-325 MG PO TABS: ORAL_TABLET | ORAL | 0 refills | Status: DC

## 2016-03-11 NOTE — Telephone Encounter (Signed)
Hydrocodone-Acetaminophen  7.5/325 mg  Qty 40 Tablets ° ° °

## 2016-03-26 ENCOUNTER — Ambulatory Visit (INDEPENDENT_AMBULATORY_CARE_PROVIDER_SITE_OTHER): Payer: Medicaid Other | Admitting: Orthopaedic Surgery

## 2016-03-26 VITALS — BP 139/86 | HR 83 | Ht 69.0 in | Wt 302.0 lb

## 2016-03-26 DIAGNOSIS — F17200 Nicotine dependence, unspecified, uncomplicated: Secondary | ICD-10-CM

## 2016-03-26 DIAGNOSIS — K2100 Gastro-esophageal reflux disease with esophagitis, without bleeding: Secondary | ICD-10-CM

## 2016-03-26 DIAGNOSIS — M778 Other enthesopathies, not elsewhere classified: Secondary | ICD-10-CM | POA: Diagnosis not present

## 2016-03-26 DIAGNOSIS — M25531 Pain in right wrist: Secondary | ICD-10-CM

## 2016-03-26 MED ORDER — HYDROCODONE-ACETAMINOPHEN 7.5-325 MG PO TABS: ORAL_TABLET | ORAL | 0 refills | Status: DC

## 2016-03-26 NOTE — Progress Notes (Signed)
Patient [name], male DOB:01/04/1981, 35 y.o. VWU:[id number]  Chief Complaint  Patient presents with  . Follow-up    right arm    HPI  [name] is a 35 y.o. male who has chronic right wrist pain.  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  He continues to have pain in the wrist on the right.  He uses a cock-up splint.  He has no new trauma.  He is taking his Naprosyn. HPI  Body mass index is 44.6 kg/m.  ROS  Review of Systems  Past Medical History:  Diagnosis Date  . Anxiety   . Bipolar 2 disorder (HCC)   . Chronic abdominal pain    HIDA scan Sept 2010, EF 93%, s/p chole oct 2010; evaluated at [facility] for abdominal wall pain; Imipramine added May 2011  . Chronic vomiting    normal GES 2009  . Depression    increased over past several months  . GERD (gastroesophageal reflux disease)    Bravo study Nov 2009 on Prilosec 40 BID with adequate acid suppression  . Hiatal hernia   . Hypertension   . S/P endoscopy    2008 Dr. Karilyn Cota: erosive reflux esophagitis, antral gastritis, H.pylori serologies neg, Nov 2009 Dr. Darrick Penna: gastritis, benign esophageal polyp, no H.pylori,  Feb 2011: [facility], Dr. Bubba Hales: normal esophagus, normal stomach, normal duodenum, path unremarkable  . Shortness of breath    SOB even with no exertion  . Skull fracture (HCC)   . Sleep apnea    supposed to sleep with CPAP  . Ventral hernia     Past Surgical History:  Procedure Laterality Date  . APPENDECTOMY  5/10  . BIOPSY  08/15/2015   Procedure: BIOPSY;  Surgeon: West Bali, MD;  [facility];  Service: Endoscopy;;  gastric bx  . CHOLECYSTECTOMY  10/10  . ESOPHAGOGASTRODUODENOSCOPY  05/2006   H.pylori neg  . ESOPHAGOGASTRODUODENOSCOPY  03/23/2008   Fields-gastritis benign esophageal polyp, otherwise normal. Negative H. pylori he (propofol)  . ESOPHAGOGASTRODUODENOSCOPY (EGD) WITH PROPOFOL N/A  08/15/2015   Procedure: ESOPHAGOGASTRODUODENOSCOPY (EGD) WITH PROPOFOL;  Surgeon: West Bali, MD;  [facility];  Service: Endoscopy;  Laterality: N/A;  2956  . INCISIONAL HERNIA REPAIR  03/04/2012   Procedure: LAPAROSCOPIC INCISIONAL HERNIA;  Surgeon: Dalia Heading, MD;  Location: [facility];  Service: General;  Laterality: N/A;  repair, recurrent  . INCISIONAL HERNIA REPAIR N/A 01/12/2014   Procedure: HERNIA REPAIR INCISIONAL WITH MESH;  Surgeon: Dalia Heading, MD;  Location: [facility];  Service: General;  Laterality: N/A;  . INGUINAL HERNIA REPAIR     child  . INSERTION OF MESH  03/04/2012   Procedure: INSERTION OF MESH;  Surgeon: Dalia Heading, MD;  Location: [facility];  Service: General;  Laterality: N/A;  . INSERTION OF MESH N/A 01/12/2014   Procedure: INSERTION OF MESH;  Surgeon: Dalia Heading, MD;  Location: [facility];  Service: General;  Laterality: N/A;  . KNEE SURGERY Right 2009  . VENTRAL HERNIA REPAIR  March 2012   Dr. Lovell Sheehan    Family History  Problem Relation Age of Onset  . Diabetes Mother   . Colon cancer Neg Hx     Social History Social History  Substance Use Topics  . Smoking status: Current Every Day Smoker    Packs/day: 1.00    Years: 15.00    Types: Cigarettes  . Smokeless  tobacco: Never Used     Comment: Less than 1/2 pk a day. trying to quit.  03/04/12 1255 states "was doing good, but now up to about a pack per day"  . Alcohol use No    Allergies  Allergen Reactions  . Dexilant [Dexlansoprazole] Other (See Comments)    Pass out  . Propoxyphene N-Acetaminophen Other (See Comments)    Unknown   . Zofran Odt [Ondansetron]     vomit  . Zofran [Ondansetron Hcl] Nausea And Vomiting    Current Outpatient Prescriptions  Medication Sig Dispense Refill  . albuterol (ACCUNEB) 0.63 MG/3ML nebulizer solution 0.63 mg 4 (four) times daily as needed.    . ALPRAZolam (XANAX) 1 MG tablet Take 1 mg by mouth 4 (four) times daily.     . Brexpiprazole (REXULTI)  2 MG TABS Take 2 mg by mouth daily.    . Cariprazine HCl (VRAYLAR) 3 MG CAPS Take 3 mg by mouth at bedtime.    . fluticasone (FLONASE) 50 MCG/ACT nasal spray Place 2 sprays into both nostrils daily.    Marland Kitchen HYDROcodone-acetaminophen (NORCO) 7.5-325 MG tablet One every six hours for pain as needed.  Do not drive car or operate machinery while taking this medicine.  Must last 14 days. 40 tablet 0  . ibuprofen (ADVIL,MOTRIN) 800 MG tablet Take 1 tablet (800 mg total) by mouth every 8 (eight) hours as needed. 100 tablet 5  . ipratropium (ATROVENT HFA) 17 MCG/ACT inhaler Inhale 2 puffs into the lungs every 6 (six) hours as needed for wheezing.     . naproxen (NAPROSYN) 500 MG tablet Take 1 tablet (500 mg total) by mouth 2 (two) times daily with a meal. 60 tablet 5  . omeprazole (PRILOSEC) 40 MG capsule Take 40 mg by mouth 2 (two) times daily.    . predniSONE (DELTASONE) 20 MG tablet 3 tabs po day one, then 2 po daily x 4 days 11 tablet 0  . promethazine (PHENERGAN) 25 MG tablet Take 1 tablet (25 mg total) by mouth every 8 (eight) hours as needed for nausea or vomiting. 30 tablet 0  . topiramate (TOPAMAX) 100 MG tablet Take 100 mg by mouth at bedtime.     . Vilazodone HCl (VIIBRYD) 20 MG TABS Take 20 mg by mouth daily.     No current facility-administered medications for this visit.      Physical Exam  Blood pressure 139/86, pulse 83, height 5\' 9"  (1.753 m), weight (!) 302 lb (137 kg).  Constitutional: overall normal hygiene, normal nutrition, well developed, normal grooming, normal body habitus. Assistive device:cock-up splint  Musculoskeletal: gait and station Limp right, muscle tone and strength are normal, no tremors or atrophy is present.  .  Neurological: coordination overall normal.  Deep tendon reflex/nerve stretch intact.  Sensation normal.  Cranial nerves II-XII intact.   Skin:   Normal overall no scars, lesions, ulcers or rashes. No psoriasis.  Psychiatric: Alert and oriented x 3.   Recent memory intact, remote memory unclear.  Normal mood and affect. Well groomed.  Good eye contact.  Cardiovascular: overall no swelling, no varicosities, no edema bilaterally, normal temperatures of the legs and arms, no clubbing, cyanosis and good capillary refill.  Lymphatic: palpation is normal.  He has dorsal tenderness of the right wrist over the distal ulna area. ROM is decreased secondary to pain.  NV is intact.  There is no redness.  There is no swelling.  The patient has been educated about the nature of the  problem(s) and counseled on treatment options.  The patient appeared to understand what I have discussed and is in agreement with it.  Encounter Diagnoses  Name Primary?  . Tendinitis of right wrist Yes  . Right wrist pain   . Morbid obesity due to excess calories (HCC)   . Smoker unmotivated to quit   . Gastroesophageal reflux disease with esophagitis     PLAN Call if any problems.  Precautions discussed.  Continue current medications.   Return to clinic 1 month   Electronically Signed Darreld McleanWayne Fallou Hulbert, MD 11/21/20178:45 AM

## 2016-03-27 ENCOUNTER — Ambulatory Visit: Payer: Medicaid Other | Admitting: Orthopaedic Surgery

## 2016-04-02 ENCOUNTER — Ambulatory Visit (INDEPENDENT_AMBULATORY_CARE_PROVIDER_SITE_OTHER): Payer: Medicaid Other | Admitting: Orthopaedic Surgery

## 2016-04-02 ENCOUNTER — Encounter: Payer: Self-pay | Admitting: Orthopaedic Surgery

## 2016-04-02 VITALS — BP 160/96 | HR 80 | Temp 97.7°F | Ht 69.0 in | Wt 302.0 lb

## 2016-04-02 DIAGNOSIS — F17200 Nicotine dependence, unspecified, uncomplicated: Secondary | ICD-10-CM

## 2016-04-02 DIAGNOSIS — M25571 Pain in right ankle and joints of right foot: Secondary | ICD-10-CM

## 2016-04-02 NOTE — Progress Notes (Signed)
Patient [name], male DOB:03/30/1981, 35 y.o. GEZ:[id number]  Chief Complaint  Patient presents with  . Ankle Pain    Chroic    HPI  [name] is a 35 y.o. male who had chronic ankle pain on the right.  It is stable.  It bothers him daily.  He has no new trauma.  He has no redness. He is taking his Naprosyn.   HPI  Body mass index is 44.6 kg/m.  ROS  Review of Systems  Constitutional:       Patient does not have Diabetes Mellitus. Patient has hypertension. Patient does have COPD or shortness of breath. Patient has BMI > 35. Patient has current smoking history.   Respiratory: Positive for cough, shortness of breath and wheezing.        Sleep apnea  Gastrointestinal: Positive for nausea and vomiting.       GERD  Neurological: Negative for numbness.  Psychiatric/Behavioral: Positive for sleep disturbance. The patient is nervous/anxious.     Past Medical History:  Diagnosis Date  . Anxiety   . Bipolar 2 disorder (HCC)   . Chronic abdominal pain    HIDA scan Sept 2010, EF 93%, s/p chole oct 2010; evaluated at [facility] for abdominal wall pain; Imipramine added May 2011  . Chronic vomiting    normal GES 2009  . Depression    increased over past several months  . GERD (gastroesophageal reflux disease)    Bravo study Nov 2009 on Prilosec 40 BID with adequate acid suppression  . Hiatal hernia   . Hypertension   . S/P endoscopy    2008 Dr. Karilyn Cota: erosive reflux esophagitis, antral gastritis, H.pylori serologies neg, Nov 2009 Dr. Darrick Penna: gastritis, benign esophageal polyp, no H.pylori,  Feb 2011: [facility], Dr. Bubba Hales: normal esophagus, normal stomach, normal duodenum, path unremarkable  . Shortness of breath    SOB even with no exertion  . Skull fracture (HCC)   . Sleep apnea    supposed to sleep with CPAP  . Ventral hernia     Past Surgical History:  Procedure Laterality Date  . APPENDECTOMY  5/10  . BIOPSY  08/15/2015   Procedure: BIOPSY;  Surgeon:  West Bali, MD;  [facility];  Service: Endoscopy;;  gastric bx  . CHOLECYSTECTOMY  10/10  . ESOPHAGOGASTRODUODENOSCOPY  05/2006   H.pylori neg  . ESOPHAGOGASTRODUODENOSCOPY  03/23/2008   Fields-gastritis benign esophageal polyp, otherwise normal. Negative H. pylori he (propofol)  . ESOPHAGOGASTRODUODENOSCOPY (EGD) WITH PROPOFOL N/A 08/15/2015   Procedure: ESOPHAGOGASTRODUODENOSCOPY (EGD) WITH PROPOFOL;  Surgeon: West Bali, MD;  [facility];  Service: Endoscopy;  Laterality: N/A;  6503  . INCISIONAL HERNIA REPAIR  03/04/2012   Procedure: LAPAROSCOPIC INCISIONAL HERNIA;  Surgeon: Dalia Heading, MD;  Location: [facility];  Service: General;  Laterality: N/A;  repair, recurrent  . INCISIONAL HERNIA REPAIR N/A 01/12/2014   Procedure: HERNIA REPAIR INCISIONAL WITH MESH;  Surgeon: Dalia Heading, MD;  Location: [facility];  Service: General;  Laterality: N/A;  . INGUINAL HERNIA REPAIR     child  . INSERTION OF MESH  03/04/2012   Procedure: INSERTION OF MESH;  Surgeon: Dalia Heading, MD;  Location: [facility];  Service: General;  Laterality: N/A;  . INSERTION OF MESH N/A 01/12/2014   Procedure: INSERTION OF MESH;  Surgeon: Dalia Heading, MD;  Location: [facility];  Service: General;  Laterality: N/A;  . KNEE SURGERY Right 2009  . VENTRAL HERNIA REPAIR  March 2012   Dr. Lovell Sheehan    Family History  Problem Relation Age of Onset  . Diabetes Mother   . Colon cancer Neg Hx     Social History Social History  Substance Use Topics  . Smoking status: Current Every Day Smoker    Packs/day: 1.00    Years: 15.00    Types: Cigarettes  . Smokeless tobacco: Never Used     Comment: Less than 1/2 pk a day. trying to quit.  03/04/12 1255 states "was doing good, but now up to about a pack per day"  . Alcohol use No    Allergies  Allergen Reactions  . Dexilant [Dexlansoprazole] Other (See Comments)    Pass out  . Propoxyphene N-Acetaminophen Other (See Comments)    Unknown   .  Zofran Odt [Ondansetron]     vomit  . Zofran [Ondansetron Hcl] Nausea And Vomiting    Current Outpatient Prescriptions  Medication Sig Dispense Refill  . albuterol (ACCUNEB) 0.63 MG/3ML nebulizer solution 0.63 mg 4 (four) times daily as needed.    . ALPRAZolam (XANAX) 1 MG tablet Take 1 mg by mouth 4 (four) times daily.     . Brexpiprazole (REXULTI) 2 MG TABS Take 2 mg by mouth daily.    . Cariprazine HCl (VRAYLAR) 3 MG CAPS Take 3 mg by mouth at bedtime.    . fluticasone (FLONASE) 50 MCG/ACT nasal spray Place 2 sprays into both nostrils daily.    Marland Kitchen HYDROcodone-acetaminophen (NORCO) 7.5-325 MG tablet One every six hours for pain as needed.  Do not drive car or operate machinery while taking this medicine.  Must last 14 days. 40 tablet 0  . ibuprofen (ADVIL,MOTRIN) 800 MG tablet Take 1 tablet (800 mg total) by mouth every 8 (eight) hours as needed. 100 tablet 5  . ipratropium (ATROVENT HFA) 17 MCG/ACT inhaler Inhale 2 puffs into the lungs every 6 (six) hours as needed for wheezing.     . naproxen (NAPROSYN) 500 MG tablet Take 1 tablet (500 mg total) by mouth 2 (two) times daily with a meal. 60 tablet 5  . omeprazole (PRILOSEC) 40 MG capsule Take 40 mg by mouth 2 (two) times daily.    . predniSONE (DELTASONE) 20 MG tablet 3 tabs po day one, then 2 po daily x 4 days 11 tablet 0  . promethazine (PHENERGAN) 25 MG tablet Take 1 tablet (25 mg total) by mouth every 8 (eight) hours as needed for nausea or vomiting. 30 tablet 0  . topiramate (TOPAMAX) 100 MG tablet Take 100 mg by mouth at bedtime.     . Vilazodone HCl (VIIBRYD) 20 MG TABS Take 20 mg by mouth daily.     No current facility-administered medications for this visit.      Physical Exam  Blood pressure (!) 160/96, pulse 80, temperature 97.7 F (36.5 C), height 5\' 9"  (1.753 m), weight (!) 302 lb (137 kg).  Constitutional: overall normal hygiene, normal nutrition, well developed, normal grooming, normal body habitus. Assistive  device:none  Musculoskeletal: gait and station Limp right, muscle tone and strength are normal, no tremors or atrophy is present.  .  Neurological: coordination overall normal.  Deep tendon reflex/nerve stretch intact.  Sensation normal.  Cranial nerves II-XII intact.   Skin:   Normal overall no scars, lesions, ulcers or rashes. No psoriasis.  Psychiatric: Alert and oriented x 3.  Recent memory intact, remote memory unclear.  Normal mood and affect. Well groomed.  Good eye contact.  Cardiovascular: overall  no swelling, no varicosities, no edema bilaterally, normal temperatures of the legs and arms, no clubbing, cyanosis and good capillary refill.  Lymphatic: palpation is normal.  He has pain of the right lateral ankle over the anterior talofibular ligament area.  He has slight swelling. ROM is full. He has limp to the right.  The patient has been educated about the nature of the problem(s) and counseled on treatment options.  The patient appeared to understand what I have discussed and is in agreement with it.  Encounter Diagnoses  Name Primary?  . Pain in joint, ankle and foot, right Yes  . Morbid obesity due to excess calories (HCC)   . Smoker unmotivated to quit     PLAN Call if any problems.  Precautions discussed.  Continue current medications.   Return to clinic 6 weeks   Electronically Signed Darreld McleanWayne Loxley Cibrian, MD 11/28/20178:52 AM

## 2016-04-02 NOTE — Patient Instructions (Signed)
Bupropion sustained-release tablets (smoking cessation) What is this medicine? BUPROPION (byoo PROE pee on) is used to help people quit smoking. This medicine may be used for other purposes; ask your health care provider or pharmacist if you have questions. COMMON BRAND NAME(S): Buproban, Zyban What should I tell my health care provider before I take this medicine? They need to know if you have any of these conditions: -an eating disorder, such as anorexia or bulimia -bipolar disorder or psychosis -diabetes or high blood sugar, treated with medication -glaucoma -head injury or brain tumor -heart disease, previous heart attack, or irregular heart beat -high blood pressure -kidney or liver disease -seizures -suicidal thoughts or a previous suicide attempt -Tourette's syndrome -weight loss -an unusual or allergic reaction to bupropion, other medicines, foods, dyes, or preservatives -breast-feeding -pregnant or trying to become pregnant How should I use this medicine? Take this medicine by mouth with a glass of water. Follow the directions on the prescription label. You can take it with or without food. If it upsets your stomach, take it with food. Do not cut, crush or chew this medicine. Take your medicine at regular intervals. If you take this medicine more than once a day, take your second dose at least 8 hours after you take your first dose. To limit difficulty in sleeping, avoid taking this medicine at bedtime. Do not take your medicine more often than directed. Do not stop taking this medicine suddenly except upon the advice of your doctor. Stopping this medicine too quickly may cause serious side effects. A special MedGuide will be given to you by the pharmacist with each prescription and refill. Be sure to read this information carefully each time. Talk to your pediatrician regarding the use of this medicine in children. Special care may be needed. Overdosage: If you think you have  taken too much of this medicine contact a poison control center or emergency room at once. NOTE: This medicine is only for you. Do not share this medicine with others. What if I miss a dose? If you miss a dose, skip the missed dose and take your next tablet at the regular time. There should be at least 8 hours between doses. Do not take double or extra doses. What may interact with this medicine? Do not take this medicine with any of the following medications: -linezolid -MAOIs like Azilect, Carbex, Eldepryl, Marplan, Nardil, and Parnate -methylene blue (injected into a vein) -other medicines that contain bupropion like Wellbutrin This medicine may also interact with the following medications: -alcohol -certain medicines for anxiety or sleep -certain medicines for blood pressure like metoprolol, propranolol -certain medicines for depression or psychotic disturbances -certain medicines for HIV or AIDS like efavirenz, lopinavir, nelfinavir, ritonavir -certain medicines for irregular heart beat like propafenone, flecainide -certain medicines for Parkinson's disease like amantadine, levodopa -certain medicines for seizures like carbamazepine, phenytoin, phenobarbital -cimetidine -clopidogrel -cyclophosphamide -digoxin -furazolidone -isoniazid -nicotine -orphenadrine -procarbazine -steroid medicines like prednisone or cortisone -stimulant medicines for attention disorders, weight loss, or to stay awake -tamoxifen -theophylline -thiotepa -ticlopidine -tramadol -warfarin This list may not describe all possible interactions. Give your health care provider a list of all the medicines, herbs, non-prescription drugs, or dietary supplements you use. Also tell them if you smoke, drink alcohol, or use illegal drugs. Some items may interact with your medicine. What should I watch for while using this medicine? Visit your doctor or health care professional for regular checks on your progress.  This medicine should be used together with a   patient support program. It is important to participate in a behavioral program, counseling, or other support program that is recommended by your health care professional. Patients and their families should watch out for new or worsening thoughts of suicide or depression. Also watch out for sudden changes in feelings such as feeling anxious, agitated, panicky, irritable, hostile, aggressive, impulsive, severely restless, overly excited and hyperactive, or not being able to sleep. If this happens, especially at the beginning of treatment or after a change in dose, call your health care professional. Avoid alcoholic drinks while taking this medicine. Drinking excessive alcoholic beverages, using sleeping or anxiety medicines, or quickly stopping the use of these agents while taking this medicine may increase your risk for a seizure. Do not drive or use heavy machinery until you know how this medicine affects you. This medicine can impair your ability to perform these tasks. Do not take this medicine close to bedtime. It may prevent you from sleeping. Your mouth may get dry. Chewing sugarless gum or sucking hard candy, and drinking plenty of water may help. Contact your doctor if the problem does not go away or is severe. Do not use nicotine patches or chewing gum without the advice of your doctor or health care professional while taking this medicine. You may need to have your blood pressure taken regularly if your doctor recommends that you use both nicotine and this medicine together. What side effects may I notice from receiving this medicine? Side effects that you should report to your doctor or health care professional as soon as possible: -allergic reactions like skin rash, itching or hives, swelling of the face, lips, or tongue -breathing problems -changes in vision -confusion -elevated mood, decreased need for sleep, racing thoughts, impulsive  behavior -fast or irregular heartbeat -hallucinations, loss of contact with reality -increased blood pressure -redness, blistering, peeling or loosening of the skin, including inside the mouth -seizures -suicidal thoughts or other mood changes -unusually weak or tired -vomiting Side effects that usually do not require medical attention (report to your doctor or health care professional if they continue or are bothersome): -constipation -headache -loss of appetite -nausea -tremors -weight loss This list may not describe all possible side effects. Call your doctor for medical advice about side effects. You may report side effects to FDA at 1-800-FDA-1088. Where should I keep my medicine? Keep out of the reach of children. Store at room temperature between 20 and 25 degrees C (68 and 77 degrees F). Protect from light. Keep container tightly closed. Throw away any unused medicine after the expiration date. NOTE: This sheet is a summary. It may not cover all possible information. If you have questions about this medicine, talk to your doctor, pharmacist, or health care provider.  2017 Elsevier/Gold Standard (2015-10-13 13:49:28)  

## 2016-04-03 ENCOUNTER — Other Ambulatory Visit (HOSPITAL_BASED_OUTPATIENT_CLINIC_OR_DEPARTMENT_OTHER): Payer: Self-pay

## 2016-04-03 DIAGNOSIS — G4730 Sleep apnea, unspecified: Secondary | ICD-10-CM

## 2016-04-08 ENCOUNTER — Telehealth: Payer: Self-pay | Admitting: Orthopaedic Surgery

## 2016-04-08 NOTE — Telephone Encounter (Signed)
Patient called and requests refill on Hydrocodone/Acetaminophen (Norco)  7.5-325  Mgs.   Qty  40       Sig: One every six hours for pain as needed. Do not drive car or operate machinery while taking this medicine. Must last 14 days.

## 2016-04-09 MED ORDER — HYDROCODONE-ACETAMINOPHEN 7.5-325 MG PO TABS: ORAL_TABLET | ORAL | 0 refills | Status: DC

## 2016-04-10 ENCOUNTER — Ambulatory Visit: Payer: Medicaid Other | Attending: Neurology | Admitting: Neurology

## 2016-04-10 DIAGNOSIS — G4730 Sleep apnea, unspecified: Secondary | ICD-10-CM | POA: Insufficient documentation

## 2016-04-10 DIAGNOSIS — J449 Chronic obstructive pulmonary disease, unspecified: Secondary | ICD-10-CM | POA: Insufficient documentation

## 2016-04-18 NOTE — Procedures (Signed)
[facility] Kambree Krauss A. Gerilyn Pilgrim, MD     [email or web address]             NOCTURNAL POLYSOMNOGRAPHY   LOCATION: [facility]   Patient Name: Langley, Ingalls Date: 04/10/2016 Gender: Male D.O.B: 11-10-1980 Age (years): 35 Referring Provider: Jorge Mandril NP Height (inches): 69 Interpreting Physician: Beryle Beams MD, ABSM Weight (lbs): 300 RPSGT: Peak, Robert BMI: 44 MRN: [id number] Neck Size: 18.50 CLINICAL INFORMATION Sleep Study Type: NPSG  Indication for sleep study: COPD  Epworth Sleepiness Score: 15  SLEEP STUDY TECHNIQUE As per the AASM Manual for the Scoring of Sleep and Associated Events v2.3 (April 2016) with a hypopnea requiring 4% desaturations.  The channels recorded and monitored were frontal, central and occipital EEG, electrooculogram (EOG), submentalis EMG (chin), nasal and oral airflow, thoracic and abdominal wall motion, anterior tibialis EMG, snore microphone, electrocardiogram, and pulse oximetry.  MEDICATIONS Medications self-administered by patient taken the night of the study : N/A  Current Outpatient Prescriptions:  .  albuterol (ACCUNEB) 0.63 MG/3ML nebulizer solution, 0.63 mg 4 (four) times daily as needed., Disp: , Rfl:  .  ALPRAZolam (XANAX) 1 MG tablet, Take 1 mg by mouth 4 (four) times daily. , Disp: , Rfl:  .  Brexpiprazole (REXULTI) 2 MG TABS, Take 2 mg by mouth daily., Disp: , Rfl:  .  Cariprazine HCl (VRAYLAR) 3 MG CAPS, Take 3 mg by mouth at bedtime., Disp: , Rfl:  .  fluticasone (FLONASE) 50 MCG/ACT nasal spray, Place 2 sprays into both nostrils daily., Disp: , Rfl:  .  HYDROcodone-acetaminophen (NORCO) 7.5-325 MG tablet, One every six hours for pain as needed.  Do not drive car or operate machinery while taking this medicine.  Must last 14 days., Disp: 40 tablet, Rfl: 0 .  ibuprofen (ADVIL,MOTRIN) 800 MG tablet, Take 1 tablet (800 mg total) by mouth every 8 (eight) hours as needed., Disp: 100 tablet, Rfl: 5 .   ipratropium (ATROVENT HFA) 17 MCG/ACT inhaler, Inhale 2 puffs into the lungs every 6 (six) hours as needed for wheezing. , Disp: , Rfl:  .  naproxen (NAPROSYN) 500 MG tablet, Take 1 tablet (500 mg total) by mouth 2 (two) times daily with a meal., Disp: 60 tablet, Rfl: 5 .  omeprazole (PRILOSEC) 40 MG capsule, Take 40 mg by mouth 2 (two) times daily., Disp: , Rfl:  .  predniSONE (DELTASONE) 20 MG tablet, 3 tabs po day one, then 2 po daily x 4 days, Disp: 11 tablet, Rfl: 0 .  promethazine (PHENERGAN) 25 MG tablet, Take 1 tablet (25 mg total) by mouth every 8 (eight) hours as needed for nausea or vomiting., Disp: 30 tablet, Rfl: 0 .  topiramate (TOPAMAX) 100 MG tablet, Take 100 mg by mouth at bedtime. , Disp: , Rfl:  .  Vilazodone HCl (VIIBRYD) 20 MG TABS, Take 20 mg by mouth daily., Disp: , Rfl:    SLEEP ARCHITECTURE The study was initiated at 11:08:51 PM and ended at 4:56:58 AM.  Sleep onset time was 132.8 minutes and the sleep efficiency was 58.1%. The total sleep time was 202.3 minutes.  Stage REM latency was 69.0 minutes.  The patient spent 9.89% of the night in stage N1 sleep, 67.87% in stage N2 sleep, 0.00% in stage N3 and 22.24% in REM.  Alpha intrusion was absent.  Supine sleep was 0.00%.  RESPIRATORY PARAMETERS The overall apnea/hypopnea index (AHI) was 16.0 per hour. There were 1 total apneas, including 1 obstructive, 0 central and 0 mixed apneas. There  were 53 hypopneas and 33 RERAs.  The AHI during Stage REM sleep was 44.0 per hour.  AHI while supine was N/A per hour.  The mean oxygen saturation was 90.05%. The minimum SpO2 during sleep was 80.00%.  Loud snoring was noted during this study.  CARDIAC DATA The 2 lead EKG demonstrated sinus rhythm. The mean heart rate was 71.14 beats per minute. Other EKG findings include: None. LEG MOVEMENT DATA The total PLMS were 0 with a resulting PLMS index of 0.00. Associated arousal with leg movement index was 0.0.  IMPRESSIONS -  Moderate obstructive sleep apnea is observed. A formal CPAP titration study is recommended.  - Abnormal sleep architecture is observed with reduced sleep latency and absent slow wave sleep.   Argie RammingKofi A Daisuke Bailey, MD Diplomate, American Board of Sleep Medicine.

## 2016-04-24 ENCOUNTER — Ambulatory Visit (INDEPENDENT_AMBULATORY_CARE_PROVIDER_SITE_OTHER): Payer: Medicaid Other | Admitting: Orthopaedic Surgery

## 2016-04-24 ENCOUNTER — Encounter: Payer: Self-pay | Admitting: Orthopaedic Surgery

## 2016-04-24 VITALS — BP 158/108 | HR 85 | Temp 97.7°F | Ht 69.0 in | Wt 298.0 lb

## 2016-04-24 DIAGNOSIS — M778 Other enthesopathies, not elsewhere classified: Secondary | ICD-10-CM

## 2016-04-24 DIAGNOSIS — F17200 Nicotine dependence, unspecified, uncomplicated: Secondary | ICD-10-CM

## 2016-04-24 MED ORDER — HYDROCODONE-ACETAMINOPHEN 7.5-325 MG PO TABS: ORAL_TABLET | ORAL | 0 refills | Status: DC

## 2016-04-24 NOTE — Progress Notes (Signed)
Patient WU:[id number] [name], male DOB:1981/04/23, 35 y.o. YNW:[id number]  Chief Complaint  Patient presents with  . Ankle Pain    chronic right    HPI  [name] is a 35 y.o. male who has chronic pain of the dorsum of the right wrist.  It comes and goes. He is taking his medicine.  I have suggested he try Aspercreme.  He has no new trauma. HPI  Body mass index is 44.01 kg/m.  ROS  Review of Systems  Constitutional:       Patient does not have Diabetes Mellitus. Patient has hypertension. Patient does have COPD or shortness of breath. Patient has BMI > 35. Patient has current smoking history.   Respiratory: Positive for cough, shortness of breath and wheezing.        Sleep apnea  Gastrointestinal: Positive for nausea and vomiting.       GERD  Neurological: Negative for numbness.  Psychiatric/Behavioral: Positive for sleep disturbance. The patient is nervous/anxious.     Past Medical History:  Diagnosis Date  . Anxiety   . Bipolar 2 disorder (HCC)   . Chronic abdominal pain    HIDA scan Sept 2010, EF 93%, s/p chole oct 2010; evaluated at [facility] for abdominal wall pain; Imipramine added May 2011  . Chronic vomiting    normal GES 2009  . Depression    increased over past several months  . GERD (gastroesophageal reflux disease)    Bravo study Nov 2009 on Prilosec 40 BID with adequate acid suppression  . Hiatal hernia   . Hypertension   . S/P endoscopy    2008 Dr. Karilyn Cota: erosive reflux esophagitis, antral gastritis, H.pylori serologies neg, Nov 2009 Dr. Darrick Penna: gastritis, benign esophageal polyp, no H.pylori,  Feb 2011: [facility], Dr. Bubba Hales: normal esophagus, normal stomach, normal duodenum, path unremarkable  . Shortness of breath    SOB even with no exertion  . Skull fracture (HCC)   . Sleep apnea    supposed to sleep with CPAP  . Ventral hernia     Past Surgical History:  Procedure Laterality Date  . APPENDECTOMY  5/10  . BIOPSY  08/15/2015   Procedure:  BIOPSY;  Surgeon: West Bali, MD;  Location: [facility];  Service: Endoscopy;;  gastric bx  . CHOLECYSTECTOMY  10/10  . ESOPHAGOGASTRODUODENOSCOPY  05/2006   H.pylori neg  . ESOPHAGOGASTRODUODENOSCOPY  03/23/2008   Fields-gastritis benign esophageal polyp, otherwise normal. Negative H. pylori he (propofol)  . ESOPHAGOGASTRODUODENOSCOPY (EGD) WITH PROPOFOL N/A 08/15/2015   Procedure: ESOPHAGOGASTRODUODENOSCOPY (EGD) WITH PROPOFOL;  Surgeon: West Bali, MD;  Location: [facility];  Service: Endoscopy;  Laterality: N/A;  [id number]  . INCISIONAL HERNIA REPAIR  03/04/2012   Procedure: LAPAROSCOPIC INCISIONAL HERNIA;  Surgeon: Dalia Heading, MD;  Location: [facility];  Service: General;  Laterality: N/A;  repair, recurrent  . INCISIONAL HERNIA REPAIR N/A 01/12/2014   Procedure: HERNIA REPAIR INCISIONAL WITH MESH;  Surgeon: Dalia Heading, MD;  Location: [facility];  Service: General;  Laterality: N/A;  . INGUINAL HERNIA REPAIR     child  . INSERTION OF MESH  03/04/2012   Procedure: INSERTION OF MESH;  Surgeon: Dalia Heading, MD;  Location: [facility];  Service: General;  Laterality: N/A;  . INSERTION OF MESH N/A 01/12/2014   Procedure: INSERTION OF MESH;  Surgeon: Dalia Heading, MD;  Location: [facility];  Service: General;  Laterality: N/A;  . KNEE SURGERY Right 2009  . VENTRAL HERNIA REPAIR  March 2012   Dr. Lovell Sheehan    Family History  Problem Relation Age of Onset  . Diabetes Mother   . Colon cancer Neg Hx     Social History Social History  Substance Use Topics  . Smoking status: Current Every Day Smoker    Packs/day: 1.00    Years: 15.00    Types: Cigarettes  . Smokeless tobacco: Never Used     Comment: Less than 1/2 pk a day. trying to quit.  03/04/12 1255 states "was doing good, but now up to about a pack per day"  . Alcohol use No    Allergies  Allergen Reactions  . Dexilant [Dexlansoprazole] Other (See Comments)    Pass out  . Propoxyphene N-Acetaminophen Other (See Comments)     Unknown   . Zofran Odt [Ondansetron]     vomit  . Zofran [Ondansetron Hcl] Nausea And Vomiting    Current Outpatient Prescriptions  Medication Sig Dispense Refill  . albuterol (ACCUNEB) 0.63 MG/3ML nebulizer solution 0.63 mg 4 (four) times daily as needed.    . ALPRAZolam (XANAX) 1 MG tablet Take 1 mg by mouth 4 (four) times daily.     . Brexpiprazole (REXULTI) 2 MG TABS Take 2 mg by mouth daily.    . Cariprazine HCl (VRAYLAR) 3 MG CAPS Take 3 mg by mouth at bedtime.    . fluticasone (FLONASE) 50 MCG/ACT nasal spray Place 2 sprays into both nostrils daily.    Marland Kitchen HYDROcodone-acetaminophen (NORCO) 7.5-325 MG tablet One every six hours for pain as needed.  Do not drive car or operate machinery while taking this medicine.  Must last 14 days. 40 tablet 0  . ibuprofen (ADVIL,MOTRIN) 800 MG tablet Take 1 tablet (800 mg total) by mouth every 8 (eight) hours as needed. 100 tablet 5  . ipratropium (ATROVENT HFA) 17 MCG/ACT inhaler Inhale 2 puffs into the lungs every 6 (six) hours as needed for wheezing.     . naproxen (NAPROSYN) 500 MG tablet Take 1 tablet (500 mg total) by mouth 2 (two) times daily with a meal. 60 tablet 5  . omeprazole (PRILOSEC) 40 MG capsule Take 40 mg by mouth 2 (two) times daily.    . predniSONE (DELTASONE) 20 MG tablet 3 tabs po day one, then 2 po daily x 4 days 11 tablet 0  . promethazine (PHENERGAN) 25 MG tablet Take 1 tablet (25 mg total) by mouth every 8 (eight) hours as needed for nausea or vomiting. 30 tablet 0  . topiramate (TOPAMAX) 100 MG tablet Take 100 mg by mouth at bedtime.     . Vilazodone HCl (VIIBRYD) 20 MG TABS Take 20 mg by mouth daily.     No current facility-administered medications for this visit.      Physical Exam  Blood pressure (!) 158/108, pulse 85, temperature 97.7 F (36.5 C), height 5\' 9"  (1.753 m), weight 298 lb (135.2 kg).  Constitutional: overall normal hygiene, normal nutrition, well developed, normal grooming, normal body  habitus. Assistive device:none  Musculoskeletal: gait and station Limp right, muscle tone and strength are normal, no tremors or atrophy is present.  .  Neurological: coordination overall normal.  Deep tendon reflex/nerve stretch intact.  Sensation normal.  Cranial nerves II-XII intact.   Skin:   Normal overall no scars, lesions, ulcers or rashes. No psoriasis.  Psychiatric: Alert and oriented x 3.  Recent memory intact, remote memory unclear.  Normal mood and affect. Well groomed.  Good eye contact.  Cardiovascular: overall no  swelling, no varicosities, no edema bilaterally, normal temperatures of the legs and arms, no clubbing, cyanosis and good capillary refill.  Lymphatic: palpation is normal.  Right dorsal wrist has tenderness over the fourth compartment. He has no swelling, no redness and full ROM.  NV intact.  The patient has been educated about the nature of the problem(s) and counseled on treatment options.  The patient appeared to understand what I have discussed and is in agreement with it.  Encounter Diagnoses  Name Primary?  . Tendinitis of right wrist Yes  . Morbid obesity due to excess calories (HCC)   . Smoker unmotivated to quit     PLAN Call if any problems.  Precautions discussed.  Continue current medications.   Return to clinic 6 weeks   Electronically Signed Jeffrey McleanWayne Rosabell Geyer, MD 12/20/20178:21 AM

## 2016-04-24 NOTE — Patient Instructions (Signed)
Steps to Quit Smoking Smoking tobacco can be harmful to your health and can affect almost every organ in your body. Smoking puts you, and those around you, at risk for developing many serious chronic diseases. Quitting smoking is difficult, but it is one of the best things that you can do for your health. It is never too late to quit. What are the benefits of quitting smoking? When you quit smoking, you lower your risk of developing serious diseases and conditions, such as:  Lung cancer or lung disease, such as COPD.  Heart disease.  Stroke.  Heart attack.  Infertility.  Osteoporosis and bone fractures.  Additionally, symptoms such as coughing, wheezing, and shortness of breath may get better when you quit. You may also find that you get sick less often because your body is stronger at fighting off colds and infections. If you are pregnant, quitting smoking can help to reduce your chances of having a baby of low birth weight. How do I get ready to quit? When you decide to quit smoking, create a plan to make sure that you are successful. Before you quit:  Pick a date to quit. Set a date within the next two weeks to give you time to prepare.  Write down the reasons why you are quitting. Keep this list in places where you will see it often, such as on your bathroom mirror or in your car or wallet.  Identify the people, places, things, and activities that make you want to smoke (triggers) and avoid them. Make sure to take these actions: ? Throw away all cigarettes at home, at work, and in your car. ? Throw away smoking accessories, such as ashtrays and lighters. ? Clean your car and make sure to empty the ashtray. ? Clean your home, including curtains and carpets.  Tell your family, friends, and coworkers that you are quitting. Support from your loved ones can make quitting easier.  Talk with your health care provider about your options for quitting smoking.  Find out what treatment  options are covered by your health insurance.  What strategies can I use to quit smoking? Talk with your healthcare provider about different strategies to quit smoking. Some strategies include:  Quitting smoking altogether instead of gradually lessening how much you smoke over a period of time. Research shows that quitting "cold turkey" is more successful than gradually quitting.  Attending in-person counseling to help you build problem-solving skills. You are more likely to have success in quitting if you attend several counseling sessions. Even short sessions of 10 minutes can be effective.  Finding resources and support systems that can help you to quit smoking and remain smoke-free after you quit. These resources are most helpful when you use them often. They can include: ? Online chats with a counselor. ? Telephone quitlines. ? Printed self-help materials. ? Support groups or group counseling. ? Text messaging programs. ? Mobile phone applications.  Taking medicines to help you quit smoking. (If you are pregnant or breastfeeding, talk with your health care provider first.) Some medicines contain nicotine and some do not. Both types of medicines help with cravings, but the medicines that include nicotine help to relieve withdrawal symptoms. Your health care provider may recommend: ? Nicotine patches, gum, or lozenges. ? Nicotine inhalers or sprays. ? Non-nicotine medicine that is taken by mouth.  Talk with your health care provider about combining strategies, such as taking medicines while you are also receiving in-person counseling. Using these two strategies together   makes you more likely to succeed in quitting than if you used either strategy on its own. If you are pregnant or breastfeeding, talk with your health care provider about finding counseling or other support strategies to quit smoking. Do not take medicine to help you quit smoking unless told to do so by your health care  provider. What things can I do to make it easier to quit? Quitting smoking might feel overwhelming at first, but there is a lot that you can do to make it easier. Take these important actions:  Reach out to your family and friends and ask that they support and encourage you during this time. Call telephone quitlines, reach out to support groups, or work with a counselor for support.  Ask people who smoke to avoid smoking around you.  Avoid places that trigger you to smoke, such as bars, parties, or smoke-break areas at work.  Spend time around people who do not smoke.  Lessen stress in your life, because stress can be a smoking trigger for some people. To lessen stress, try: ? Exercising regularly. ? Deep-breathing exercises. ? Yoga. ? Meditating. ? Performing a body scan. This involves closing your eyes, scanning your body from head to toe, and noticing which parts of your body are particularly tense. Purposefully relax the muscles in those areas.  Download or purchase mobile phone or tablet apps (applications) that can help you stick to your quit plan by providing reminders, tips, and encouragement. There are many free apps, such as QuitGuide from the CDC (Centers for Disease Control and Prevention). You can find other support for quitting smoking (smoking cessation) through smokefree.gov and other websites.  How will I feel when I quit smoking? Within the first 24 hours of quitting smoking, you may start to feel some withdrawal symptoms. These symptoms are usually most noticeable 2-3 days after quitting, but they usually do not last beyond 2-3 weeks. Changes or symptoms that you might experience include:  Mood swings.  Restlessness, anxiety, or irritation.  Difficulty concentrating.  Dizziness.  Strong cravings for sugary foods in addition to nicotine.  Mild weight gain.  Constipation.  Nausea.  Coughing or a sore throat.  Changes in how your medicines work in your  body.  A depressed mood.  Difficulty sleeping (insomnia).  After the first 2-3 weeks of quitting, you may start to notice more positive results, such as:  Improved sense of smell and taste.  Decreased coughing and sore throat.  Slower heart rate.  Lower blood pressure.  Clearer skin.  The ability to breathe more easily.  Fewer sick days.  Quitting smoking is very challenging for most people. Do not get discouraged if you are not successful the first time. Some people need to make many attempts to quit before they achieve long-term success. Do your best to stick to your quit plan, and talk with your health care provider if you have any questions or concerns. This information is not intended to replace advice given to you by your health care provider. Make sure you discuss any questions you have with your health care provider. Document Released: 04/16/2001 Document Revised: 12/19/2015 Document Reviewed: 09/06/2014 Elsevier Interactive Patient Education  2017 Elsevier Inc.  

## 2016-05-01 ENCOUNTER — Other Ambulatory Visit (HOSPITAL_BASED_OUTPATIENT_CLINIC_OR_DEPARTMENT_OTHER): Payer: Self-pay

## 2016-05-01 DIAGNOSIS — G4733 Obstructive sleep apnea (adult) (pediatric): Secondary | ICD-10-CM

## 2016-05-08 ENCOUNTER — Telehealth: Payer: Self-pay | Admitting: Orthopaedic Surgery

## 2016-05-08 MED ORDER — HYDROCODONE-ACETAMINOPHEN 7.5-325 MG PO TABS: ORAL_TABLET | ORAL | 0 refills | Status: DC

## 2016-05-08 NOTE — Telephone Encounter (Signed)
Patient came to office, requests refill:  HYDROcodone-acetaminophen (NORCO) 7.5-325 MG tablet 40 tablet 0

## 2016-05-08 NOTE — Telephone Encounter (Signed)
Hydrocodone-Acetaminophen  7.5/325mg  Qty 28 Tablets ° °Patient is Medicaid °

## 2016-05-09 ENCOUNTER — Telehealth: Payer: Self-pay | Admitting: Gastroenterology

## 2016-05-09 DIAGNOSIS — E8801 Alpha-1-antitrypsin deficiency: Secondary | ICD-10-CM

## 2016-05-09 NOTE — Telephone Encounter (Signed)
Pt called asking if we could fax his lab orders to [facility] and when did he need to follow up with [place]. I told him that I didn't have him on the recall list to follow up, but depending on his labs the provider would make recommendations. He would like for the nurse to call him back at [phone]

## 2016-05-09 NOTE — Telephone Encounter (Signed)
I do not see where pt needs labs. Routing to Tana Coast, PA who saw pt last in the office on 12/15/2015. Verlon Au, please advise!

## 2016-05-09 NOTE — Telephone Encounter (Signed)
I'm not sure what happened. ?glitch in EPIC. I addressed labs late 12/2015. Patient should have went back for further labs, see result note. Please make arrangements now.  alpha-1-antitrypsin and alpha-1-antitrypsin genetic mutation analysis

## 2016-05-09 NOTE — Telephone Encounter (Signed)
I left Vm that I am faxing the lab orders to [facility] and we will let him know after those labs when he needs an appointment.

## 2016-05-09 NOTE — Addendum Note (Signed)
Addended by: Tiffany Kocher on: 05/09/2016 01:01 PM   Modules accepted: Orders

## 2016-05-10 ENCOUNTER — Ambulatory Visit (HOSPITAL_COMMUNITY)
Admission: RE | Admit: 2016-05-10 | Discharge: 2016-05-10 | Disposition: A | Discharge status: Home / Self Care | Payer: Medicaid Other | Source: Ambulatory Visit | Attending: Internal Medicine | Admitting: Internal Medicine

## 2016-05-10 DIAGNOSIS — R9431 Abnormal electrocardiogram [ECG] [EKG]: Secondary | ICD-10-CM | POA: Insufficient documentation

## 2016-05-10 DIAGNOSIS — R0789 Other chest pain: Secondary | ICD-10-CM | POA: Insufficient documentation

## 2016-05-11 ENCOUNTER — Emergency Department (HOSPITAL_COMMUNITY)
Admission: EM | Admit: 2016-05-11 | Discharge: 2016-05-11 | Disposition: A | Discharge status: Home / Self Care | Payer: Medicaid Other | Attending: Emergency Medicine | Admitting: Emergency Medicine

## 2016-05-11 ENCOUNTER — Encounter (HOSPITAL_COMMUNITY): Payer: Self-pay | Admitting: *Deleted

## 2016-05-11 ENCOUNTER — Emergency Department (HOSPITAL_COMMUNITY): Payer: Medicaid Other

## 2016-05-11 DIAGNOSIS — R0789 Other chest pain: Secondary | ICD-10-CM | POA: Diagnosis not present

## 2016-05-11 DIAGNOSIS — F17210 Nicotine dependence, cigarettes, uncomplicated: Secondary | ICD-10-CM | POA: Insufficient documentation

## 2016-05-11 DIAGNOSIS — J449 Chronic obstructive pulmonary disease, unspecified: Secondary | ICD-10-CM | POA: Diagnosis not present

## 2016-05-11 DIAGNOSIS — Z79899 Other long term (current) drug therapy: Secondary | ICD-10-CM | POA: Diagnosis not present

## 2016-05-11 DIAGNOSIS — J45909 Unspecified asthma, uncomplicated: Secondary | ICD-10-CM | POA: Diagnosis not present

## 2016-05-11 DIAGNOSIS — I10 Essential (primary) hypertension: Secondary | ICD-10-CM | POA: Diagnosis not present

## 2016-05-11 LAB — BASIC METABOLIC PANEL
Anion gap: 8 (ref 5–15)
BUN: 17 mg/dL (ref 6–20)
CHLORIDE: 107 mmol/L (ref 101–111)
CO2: 22 mmol/L (ref 22–32)
CREATININE: 1.05 mg/dL (ref 0.61–1.24)
Calcium: 9.2 mg/dL (ref 8.9–10.3)
GFR calc Af Amer: >60 mL/min (ref >60)
GFR calc non Af Amer: >60 mL/min (ref >60)
Glucose, Bld: 137 mg/dL — ABNORMAL HIGH (ref 65–99)
Potassium: 3.7 mmol/L (ref 3.5–5.1)
SODIUM: 137 mmol/L (ref 135–145)

## 2016-05-11 LAB — CBC
HCT: 47 % (ref 39.0–52.0)
Hemoglobin: 16 g/dL (ref 13.0–17.0)
MCH: 31.7 pg (ref 26.0–34.0)
MCHC: 34 g/dL (ref 30.0–36.0)
MCV: 93.1 fL (ref 78.0–100.0)
PLATELETS: 205 10*3/uL (ref 150–400)
RBC: 5.05 MIL/uL (ref 4.22–5.81)
RDW: 12.5 % (ref 11.5–15.5)
WBC: 9.2 10*3/uL (ref 4.0–10.5)

## 2016-05-11 LAB — TROPONIN I: Troponin I: <0.03 ng/mL (ref <0.03)

## 2016-05-11 LAB — D-DIMER, QUANTITATIVE (NOT AT ARMC)

## 2016-05-11 MED ORDER — NICOTINE 21 MG/24HR TD PT24
21.0000 mg | MEDICATED_PATCH | Freq: Once | TRANSDERMAL | Status: DC
  Administered 2016-05-11: 21 mg via TRANSDERMAL
  Filled 2016-05-11: qty 1

## 2016-05-11 NOTE — ED Triage Notes (Signed)
Pt reports intermittent CP over the last 2 weeks. Pt reports 1 Nitro taken today about 20 minutes with no relief. Pt was seen by PCP yesterday and EKG done due to the CP, but pt is unaware of any diagnosis. Pt also reports SOB. Denies nausea, vomiting, lightheadedness, dizziness.

## 2016-05-11 NOTE — Discharge Instructions (Signed)
Follow-up with cardiology in  here for recheck and possible stress test

## 2016-05-11 NOTE — ED Provider Notes (Signed)
[facility] Provider Note   CSN: [id number] Arrival date & time: 05/11/16  1628     History   Chief Complaint Chief Complaint  Patient presents with  . Chest Pain    HPI [name] is a 36 y.o. male.  Patient complains of some chest discomfort numbness in his arm. No fever no chills no shortness of breath   The history is provided by the patient. No language interpreter was used.  Chest Pain   This is a new problem. The current episode started more than 1 week ago. The problem occurs rarely. The problem has not changed since onset.Associated with: unknown. The pain is present in the epigastric region. The pain is at a severity of 5/10. The pain is moderate. The quality of the pain is described as burning. Pertinent negatives include no abdominal pain, no back pain, no cough and no headaches.  Pertinent negatives for past medical history include no seizures.    Past Medical History:  Diagnosis Date  . Anxiety   . Bipolar 2 disorder (HCC)   . Chronic abdominal pain    HIDA scan Sept 2010, EF 93%, s/p chole oct 2010; evaluated at [facility] for abdominal wall pain; Imipramine added May 2011  . Chronic vomiting    normal GES 2009  . Depression    increased over past several months  . GERD (gastroesophageal reflux disease)    Bravo study Nov 2009 on Prilosec 40 BID with adequate acid suppression  . Hiatal hernia   . Hypertension   . S/P endoscopy    2008 Dr. Karilyn Cota: erosive reflux esophagitis, antral gastritis, H.pylori serologies neg, Nov 2009 Dr. Darrick Penna: gastritis, benign esophageal polyp, no H.pylori,  Feb 2011: [facility], Dr. Bubba Hales: normal esophagus, normal stomach, normal duodenum, path unremarkable  . Shortness of breath    SOB even with no exertion  . Skull fracture (HCC)   . Sleep apnea    supposed to sleep with CPAP  . Ventral hernia     Patient Active Problem List   Diagnosis Date Noted  . Intracranial hemorrhage (HCC) 01/20/2016  . Nausea without  vomiting 12/15/2015  . Early satiety 07/25/2015  . Gastroesophageal reflux disease with esophagitis 06/27/2015  . Nausea vomiting and diarrhea 04/27/2015  . Transaminitis 04/27/2015  . Cough 06/23/2013  . Tobacco abuse counseling 06/23/2013  . Abdominal cramping 05/05/2013  . COPD (chronic obstructive pulmonary disease) with acute bronchitis (HCC) 05/05/2013  . Syncope 05/05/2013  . Depression with anxiety 05/05/2013  . Hyperglycemia 05/05/2013  . Black-out (not amnesia) 05/05/2013  . Smoking 04/16/2013  . Blackout spell 04/16/2013  . Hx of bipolar disorder 04/16/2013  . GERD (gastroesophageal reflux disease) 04/16/2013  . OSA (obstructive sleep apnea) 04/16/2013  . Dyspnea 04/16/2013  . Bipolar 2 disorder (HCC) 03/08/2013  . HTN (hypertension) 03/08/2013  . Tobacco abuse 03/08/2013  . Referred for management of medication therapy 03/08/2013  . Asthma exacerbation, non-allergic 03/08/2013  . Sore throat 03/08/2013  . Unspecified sleep apnea 03/08/2013  . Pain in joint, ankle and foot 11/25/2012  . Hyperbilirubinemia 01/10/2012  . Ventral hernia without obstruction or gangrene 11/12/2011  . Obesity 11/12/2011  . Abdominal wall pain 03/06/2011  . NAUSEA WITH VOMITING 05/15/2009  . WEIGHT GAIN, ABNORMAL 03/21/2009  . EPIGASTRIC PAIN, CHRONIC 03/21/2009  . 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 SMOKER 03/20/2009  . DEPRESSION 03/20/2009  . HYPERTENSION 03/20/2009  . GERD 03/20/2009  . HEMATEMESIS 03/20/2009  . SHOULDER PAIN, RIGHT 03/20/2009  . ANOREXIA 03/20/2009  .  ABDOMINAL PAIN, GENERALIZED 03/20/2009  . CANNABIS ABUSE, HX OF 03/20/2009    Past Surgical History:  Procedure Laterality Date  . APPENDECTOMY  5/10  . BIOPSY  08/15/2015   Procedure: BIOPSY;  Surgeon: West Bali, MD;  Location: [facility];  Service: Endoscopy;;  gastric bx  . CHOLECYSTECTOMY  10/10  . ESOPHAGOGASTRODUODENOSCOPY  05/2006   H.pylori neg  . ESOPHAGOGASTRODUODENOSCOPY  03/23/2008   Fields-gastritis  benign esophageal polyp, otherwise normal. Negative H. pylori he (propofol)  . ESOPHAGOGASTRODUODENOSCOPY (EGD) WITH PROPOFOL N/A 08/15/2015   Procedure: ESOPHAGOGASTRODUODENOSCOPY (EGD) WITH PROPOFOL;  Surgeon: West Bali, MD;  Location: [facility];  Service: Endoscopy;  Laterality: N/A;  1610  . INCISIONAL HERNIA REPAIR  03/04/2012   Procedure: LAPAROSCOPIC INCISIONAL HERNIA;  Surgeon: Dalia Heading, MD;  Location: [facility] ORS;  Service: General;  Laterality: N/A;  repair, recurrent  . INCISIONAL HERNIA REPAIR N/A 01/12/2014   Procedure: HERNIA REPAIR INCISIONAL WITH MESH;  Surgeon: Dalia Heading, MD;  Location: [facility] OR[facility];  Service: General;  Laterality: N/A;  . INGUINAL HERNIA REPAIR     child  . INSERTION OF MESH  03/04/2012   Procedure: INSERTION OF MESH;  Surgeon: Dalia Heading, MD;  Location: [facility];  Service: General;  Laterality: N/A;  . INSERTION OF MESH N/A 01/12/2014   Procedure: INSERTION OF MESH;  Surgeon: Dalia Heading, MD;  Location: [facility] OR[facility];  Service: General;  Laterality: N/A;  . KNEE SURGERY Right 2009  . VENTRAL HERNIA REPAIR  March 2012   Dr. Lovell Sheehan       Home Medications    Prior to Admission medications   Medication Sig Start Date End Date Taking? Authorizing Provider  albuterol (ACCUNEB) 0.63 MG/3ML nebulizer solution 0.63 mg 4 (four) times daily as needed for wheezing or shortness of breath.    Yes Historical Provider, MD  ALPRAZolam Prudy Feeler) 1 MG tablet Take 1 mg by mouth 4 (four) times daily.    Yes Historical Provider, MD  Brexpiprazole (REXULTI) 2 MG TABS Take 2 mg by mouth daily.   Yes Historical Provider, MD  Cariprazine HCl (VRAYLAR) 3 MG CAPS Take 3 mg by mouth at bedtime.   Yes Historical Provider, MD  donepezil (ARICEPT) 5 MG tablet Take 5 mg by mouth every morning.   Yes Historical Provider, MD  fluticasone (FLONASE) 50 MCG/ACT nasal spray Place 2 sprays into both nostrils daily.   Yes Historical Provider, MD  HYDROcodone-acetaminophen (NORCO) 7.5-325  MG tablet One every six hours as needed for pain.  Seven day limit per Medicaid guidelines. Patient taking differently: Take 1 tablet by mouth every 6 (six) hours as needed for moderate pain or severe pain. One every six hours as needed for pain.  Seven day limit per Medicaid guidelines. 05/08/16  Yes Darreld Mclean, MD  ibuprofen (ADVIL,MOTRIN) 800 MG tablet Take 1 tablet (800 mg total) by mouth every 8 (eight) hours as needed. 12/28/15  Yes Darreld Mclean, MD  Topiramate ER (TROKENDI XR) 50 MG CP24 Take 50 mg by mouth at bedtime.   Yes Historical Provider, MD  omeprazole (PRILOSEC) 40 MG capsule Take 40 mg by mouth 2 (two) times daily.    Historical Provider, MD    Family History Family History  Problem Relation Age of Onset  . Diabetes Mother   . Colon cancer Neg Hx     Social History Social History  Substance Use Topics  . Smoking status: Current Every Day Smoker    Packs/day:  1.00    Years: 15.00    Types: Cigarettes  . Smokeless tobacco: Never Used     Comment: Less than 1/2 pk a day. trying to quit.  03/04/12 1255 states "was doing good, but now up to about a pack per day"  . Alcohol use No     Allergies   Dexilant [dexlansoprazole]; Propoxyphene n-acetaminophen; Zofran odt [ondansetron]; and Zofran [ondansetron hcl]   Review of Systems Review of Systems  Constitutional: Negative for appetite change and fatigue.  HENT: Negative for congestion, ear discharge and sinus pressure.   Eyes: Negative for discharge.  Respiratory: Negative for cough.   Cardiovascular: Positive for chest pain.  Gastrointestinal: Negative for abdominal pain and diarrhea.  Genitourinary: Negative for frequency and hematuria.  Musculoskeletal: Negative for back pain.  Skin: Negative for rash.  Neurological: Negative for seizures and headaches.  Psychiatric/Behavioral: Negative for hallucinations.     Physical Exam Updated Vital Signs BP 133/86   Pulse 69   Temp 97.2 F (36.2 C) (Temporal)    Resp 11   Ht 5\' 9"  (1.753 m)   Wt 270 lb (122.5 kg)   SpO2 98%   BMI 39.87 kg/m   Physical Exam  Constitutional: He is oriented to person, place, and time. He appears well-developed.  HENT:  Head: Normocephalic.  Eyes: Conjunctivae and EOM are normal. No scleral icterus.  Neck: Neck supple. No thyromegaly present.  Cardiovascular: Normal rate and regular rhythm.  Exam reveals no gallop and no friction rub.   No murmur heard. Pulmonary/Chest: No stridor. He has no wheezes. He has no rales. He exhibits no tenderness.  Abdominal: He exhibits no distension. There is no tenderness. There is no rebound.  Musculoskeletal: Normal range of motion. He exhibits no edema.  Lymphadenopathy:    He has no cervical adenopathy.  Neurological: He is oriented to person, place, and time. He exhibits normal muscle tone. Coordination normal.  Skin: No rash noted. No erythema.  Psychiatric: He has a normal mood and affect. His behavior is normal.     ED Treatments / Results  Labs (all labs ordered are listed, but only abnormal results are displayed) Labs Reviewed  BASIC METABOLIC PANEL - Abnormal; Notable for the following:       Result Value   Glucose, Bld 137 (*)    All other components within normal limits  CBC  TROPONIN I  TROPONIN I  D-DIMER, QUANTITATIVE (NOT AT [facility])    EKG  EKG Interpretation  Date/Time:  Saturday May 11 2016 16:37:02 EST Ventricular Rate:  102 PR Interval:  112 QRS Duration: 86 QT Interval:  316 QTC Calculation: 411 R Axis:   59 Text Interpretation:  Sinus tachycardia T wave abnormality, consider inferior ischemia Abnormal ECG Confirmed by Philopater Mucha  MD, Jamaris Theard [phone]) on 05/11/2016 7:33:10 PM       Radiology Dg Chest 2 View  Result Date: 05/11/2016 CLINICAL DATA:  Intermittent shortness of breath over the past 2 weeks with chest pain. EXAM: CHEST  2 VIEW COMPARISON:  PA and lateral chest 01/22/2016. Single-view of the chest 01/23/2015. FINDINGS: Lungs are  clear. Heart size is normal. No pneumothorax or pleural effusion. No bony abnormality. IMPRESSION: Negative chest. Electronically Signed   By: Drusilla Kanner M.D.   On: 05/11/2016 17:01    Procedures Procedures (including critical care time)  Medications Ordered in ED Medications  nicotine (NICODERM CQ - dosed in mg/24 hours) patch 21 mg (21 mg Transdermal Patch Applied 05/11/16 2137)  Initial Impression / Assessment and Plan / ED Course  I have reviewed the triage vital signs and the nursing notes.  Pertinent labs & imaging results that were available during my care of the patient were reviewed by me and considered in my medical decision making (see chart for details).  Clinical Course     Patient with chest pain. Doubt chest pain is cardiac related. Patient had 2 normal troponins. Suspect stress-related pain. He is referred to cardiology for further workup as needed  Final Clinical Impressions(s) / ED Diagnoses   Final diagnoses:  Atypical chest pain    New Prescriptions New Prescriptions   No medications on file     Bethann Berkshire, MD 05/11/16 2236

## 2016-05-14 ENCOUNTER — Ambulatory Visit: Payer: Medicaid Other | Admitting: Orthopaedic Surgery

## 2016-05-14 LAB — ALPHA-1-ANTITRYPSIN: A1 ANTITRYPSIN SER: 75 mg/dL — AB (ref 83–199)

## 2016-05-17 LAB — ALPHA-1 ANTITRYPSIN MUT ANALYSIS

## 2016-05-24 ENCOUNTER — Encounter: Payer: Self-pay | Admitting: Cardiovascular Disease

## 2016-05-24 ENCOUNTER — Ambulatory Visit (INDEPENDENT_AMBULATORY_CARE_PROVIDER_SITE_OTHER): Payer: Medicaid Other | Admitting: Cardiovascular Disease

## 2016-05-24 VITALS — BP 124/84 | HR 84 | Ht 69.0 in | Wt 302.0 lb

## 2016-05-24 DIAGNOSIS — R072 Precordial pain: Secondary | ICD-10-CM

## 2016-05-24 DIAGNOSIS — I10 Essential (primary) hypertension: Secondary | ICD-10-CM

## 2016-05-24 DIAGNOSIS — Z720 Tobacco use: Secondary | ICD-10-CM | POA: Diagnosis not present

## 2016-05-24 DIAGNOSIS — Z9289 Personal history of other medical treatment: Secondary | ICD-10-CM

## 2016-05-24 NOTE — Patient Instructions (Signed)
Your physician recommends that you schedule a follow-up appointment in: 6 weeks Dr Purvis Sheffield    Your physician has requested that you have a lexiscan myoview. For further information please visit [email or web address] Please follow instruction sheet, as given.     Your physician recommends that you continue on your current medications as directed. Please refer to the Current Medication list given to you today.      Follow up with GI      Thank you for choosing [facility] !

## 2016-05-24 NOTE — Progress Notes (Signed)
CARDIOLOGY CONSULT NOTE  Patient ID: [name] MRN: [id number] DOB/AGE: [birth year] 36 y.o.  Admit date: (Not on file) Primary Physician: Avon Gully, MD Referring Physician: Estell Harpin MD  Reason for Consultation: chest pain  HPI: 36 yr old male evaluated in ED for chest pain 05/11/16. Has a reported h/o chronic vomiting, morbid obesity, depression, GERD, hiatal hernia, hypertension, and bipolar disorder.  The ED felt the patient's symptoms were non-cardiac and more likely to be stress-related. 2 troponins and d-dimer were normal, as were CBC, chest xray, and BMET.  ECG showed sinus tachycardia with a nonspecific T wave abnormality, HR 102 bpm.  Said he had chest pain for 2 weeks prior to ED presentation, occurring both with exertion and while lying down. Had left arm numbness as well. Took one nitroglycerin tablet prior to ED and pain resolved.  Denies dysphagia for solids/liquids. Denies orthopnea and leg swelling.  Soc Hx: Has smoked 2 ppd x 21 years.  Fam Hx: Both of his parents have heart disease. His mother, Jesse Nosbisch, is also my patient and has paroxysmal atrial fibrillation. His father recently had a stent. Maternal uncle had "massive MI" at age 58.      Allergies  Allergen Reactions  . Dexilant [Dexlansoprazole] Other (See Comments)    Pass out  . Propoxyphene N-Acetaminophen Other (See Comments)    Unknown   . Zofran Odt [Ondansetron]     vomit  . Zofran [Ondansetron Hcl] Nausea And Vomiting    Current Outpatient Prescriptions  Medication Sig Dispense Refill  . albuterol (ACCUNEB) 0.63 MG/3ML nebulizer solution 0.63 mg 4 (four) times daily as needed for wheezing or shortness of breath.     . ALPRAZolam (XANAX) 1 MG tablet Take 1 mg by mouth 4 (four) times daily.     . Brexpiprazole (REXULTI) 2 MG TABS Take 2 mg by mouth daily.    . budesonide-formoterol (SYMBICORT) 80-4.5 MCG/ACT inhaler Inhale 2 puffs into the lungs 2 (two) times daily.    .  Cariprazine HCl (VRAYLAR) 3 MG CAPS Take 3 mg by mouth at bedtime.    . donepezil (ARICEPT) 5 MG tablet Take 5 mg by mouth every morning.    . fluticasone (FLONASE) 50 MCG/ACT nasal spray Place 2 sprays into both nostrils daily.    Marland Kitchen HYDROcodone-acetaminophen (NORCO) 7.5-325 MG tablet One every six hours as needed for pain.  Seven day limit per Medicaid guidelines. (Patient taking differently: Take 1 tablet by mouth every 6 (six) hours as needed for moderate pain or severe pain. One every six hours as needed for pain.  Seven day limit per Medicaid guidelines.) 28 tablet 0  . omeprazole (PRILOSEC) 40 MG capsule Take 40 mg by mouth 2 (two) times daily.    . rizatriptan (MAXALT) 10 MG tablet Take 10 mg by mouth as needed for migraine. May repeat in 2 hours if needed    . Topiramate ER (TROKENDI XR) 50 MG CP24 Take 50 mg by mouth at bedtime.     No current facility-administered medications for this visit.     Past Medical History:  Diagnosis Date  . Anxiety   . Bipolar 2 disorder (HCC)   . Chronic abdominal pain    HIDA scan Sept 2010, EF 93%, s/p chole oct 2010; evaluated at [facility] for abdominal wall pain; Imipramine added May 2011  . Chronic vomiting    normal GES 2009  . Depression    increased over past several months  . GERD (  gastroesophageal reflux disease)    Bravo study Nov 2009 on Prilosec 40 BID with adequate acid suppression  . Hiatal hernia   . Hypertension   . S/P endoscopy    2008 Dr. Karilyn Cota: erosive reflux esophagitis, antral gastritis, H.pylori serologies neg, Nov 2009 Dr. Darrick Penna: gastritis, benign esophageal polyp, no H.pylori,  Feb 2011: [facility], Dr. Bubba Hales: normal esophagus, normal stomach, normal duodenum, path unremarkable  . Shortness of breath    SOB even with no exertion  . Skull fracture (HCC)   . Sleep apnea    supposed to sleep with CPAP  . Ventral hernia     Past Surgical History:  Procedure Laterality Date  . APPENDECTOMY  5/10  . BIOPSY  08/15/2015    Procedure: BIOPSY;  Surgeon: West Bali, MD;  Location: [facility];  Service: Endoscopy;;  gastric bx  . CHOLECYSTECTOMY  10/10  . ESOPHAGOGASTRODUODENOSCOPY  05/2006   H.pylori neg  . ESOPHAGOGASTRODUODENOSCOPY  03/23/2008   Fields-gastritis benign esophageal polyp, otherwise normal. Negative H. pylori he (propofol)  . ESOPHAGOGASTRODUODENOSCOPY (EGD) WITH PROPOFOL N/A 08/15/2015   Procedure: ESOPHAGOGASTRODUODENOSCOPY (EGD) WITH PROPOFOL;  Surgeon: West Bali, MD;  Location: [facility];  Service: Endoscopy;  Laterality: N/A;  1610  . INCISIONAL HERNIA REPAIR  03/04/2012   Procedure: LAPAROSCOPIC INCISIONAL HERNIA;  Surgeon: Dalia Heading, MD;  Location: [facility] OR[facility];  Service: General;  Laterality: N/A;  repair, recurrent  . INCISIONAL HERNIA REPAIR N/A 01/12/2014   Procedure: HERNIA REPAIR INCISIONAL WITH MESH;  Surgeon: Dalia Heading, MD;  Location: [facility];  Service: General;  Laterality: N/A;  . INGUINAL HERNIA REPAIR     child  . INSERTION OF MESH  03/04/2012   Procedure: INSERTION OF MESH;  Surgeon: Dalia Heading, MD;  Location: [facility];  Service: General;  Laterality: N/A;  . INSERTION OF MESH N/A 01/12/2014   Procedure: INSERTION OF MESH;  Surgeon: Dalia Heading, MD;  Location: [facility];  Service: General;  Laterality: N/A;  . KNEE SURGERY Right 2009  . VENTRAL HERNIA REPAIR  March 2012   Dr. Lovell Sheehan    Social History   Social History  . Marital status: Single    Spouse name: N/A  . Number of children: 1  . Years of education: N/A   Occupational History  . disabled     applying for disability, depression  .  Not Employed   Social History Main Topics  . Smoking status: Current Every Day Smoker    Packs/day: 1.00    Years: 15.00    Types: Cigarettes  . Smokeless tobacco: Never Used     Comment: Less than 1/2 pk a day. trying to quit.  03/04/12 1255 states "was doing good, but now up to about a pack per day"  . Alcohol use No  . Drug use: No  . Sexual activity:  No   Other Topics Concern  . Not on file   Social History Narrative   Lives alone       Prior to Admission medications   Medication Sig Start Date End Date Taking? Authorizing Provider  albuterol (ACCUNEB) 0.63 MG/3ML nebulizer solution 0.63 mg 4 (four) times daily as needed for wheezing or shortness of breath.     Historical Provider, MD  ALPRAZolam Prudy Feeler) 1 MG tablet Take 1 mg by mouth 4 (four) times daily.     Historical Provider, MD  Brexpiprazole (REXULTI) 2 MG TABS Take 2 mg by mouth daily.  Historical Provider, MD  Cariprazine HCl (VRAYLAR) 3 MG CAPS Take 3 mg by mouth at bedtime.    Historical Provider, MD  donepezil (ARICEPT) 5 MG tablet Take 5 mg by mouth every morning.    Historical Provider, MD  fluticasone (FLONASE) 50 MCG/ACT nasal spray Place 2 sprays into both nostrils daily.    Historical Provider, MD  HYDROcodone-acetaminophen (NORCO) 7.5-325 MG tablet One every six hours as needed for pain.  Seven day limit per Medicaid guidelines. Patient taking differently: Take 1 tablet by mouth every 6 (six) hours as needed for moderate pain or severe pain. One every six hours as needed for pain.  Seven day limit per Medicaid guidelines. 05/08/16   Darreld Mclean, MD  ibuprofen (ADVIL,MOTRIN) 800 MG tablet Take 1 tablet (800 mg total) by mouth every 8 (eight) hours as needed. 12/28/15   Darreld Mclean, MD  omeprazole (PRILOSEC) 40 MG capsule Take 40 mg by mouth 2 (two) times daily.    Historical Provider, MD  Topiramate ER (TROKENDI XR) 50 MG CP24 Take 50 mg by mouth at bedtime.    Historical Provider, MD     Review of systems complete and found to be negative unless listed above in HPI     Physical exam Blood pressure 124/84, pulse 84, height 5\' 9"  (1.753 m), weight (!) 302 lb (137 kg), SpO2 96 %. General: NAD Neck: No JVD, no thyromegaly or thyroid nodule.  Lungs: Clear to auscultation bilaterally with normal respiratory effort. CV: Nondisplaced PMI. Regular rate and  rhythm, normal S1/S2, no S3/S4, no murmur.  No peripheral edema.  No carotid bruit.  Abdomen: Soft, nontender, obese.  Skin: Intact without lesions or rashes.  Neurologic: Alert and oriented x 3.  Psych: Flat affect. Extremities: No clubbing or cyanosis.  HEENT: Normal.   ECG: Most recent ECG reviewed.  Labs:   Lab Results  Component Value Date   WBC 9.2 05/11/2016   HGB 16.0 05/11/2016   HCT 47.0 05/11/2016   MCV 93.1 05/11/2016   PLT 205 05/11/2016   No results for input(s): NA, K, CL, CO2, BUN, CREATININE, CALCIUM, PROT, BILITOT, ALKPHOS, ALT, AST, GLUCOSE in the last 168 hours.  Invalid input(s): LABALBU Lab Results  Component Value Date   TROPONINI <0.03 05/11/2016   No results found for: CHOL No results found for: HDL No results found for: LDLCALC No results found for: TRIG No results found for: CHOLHDL No results found for: LDLDIRECT       Studies: No results found.  ASSESSMENT AND PLAN:  1. Chest pain: Has both typical and atypical symptoms. Risk factors include hypertension and tobacco abuse. Doubt ability to walk sufficiently on a treadmill. Will obtain 2-day Lexiscan Myoview stress test.  2. Hypertension: Controlled on lisinopril 40 mg. No changes.  3. Tobacco abuse   Signed: Prentice Docker, M.D., F.A.C.C.  05/24/2016, 2:13 PM

## 2016-05-26 ENCOUNTER — Emergency Department (HOSPITAL_COMMUNITY)
Admission: EM | Admit: 2016-05-26 | Discharge: 2016-05-26 | Disposition: A | Discharge status: Home / Self Care | Payer: Medicaid Other | Attending: Emergency Medicine | Admitting: Emergency Medicine

## 2016-05-26 ENCOUNTER — Emergency Department (HOSPITAL_COMMUNITY): Payer: Medicaid Other

## 2016-05-26 ENCOUNTER — Encounter (HOSPITAL_COMMUNITY): Payer: Self-pay | Admitting: Cardiology

## 2016-05-26 DIAGNOSIS — J45909 Unspecified asthma, uncomplicated: Secondary | ICD-10-CM | POA: Insufficient documentation

## 2016-05-26 DIAGNOSIS — I10 Essential (primary) hypertension: Secondary | ICD-10-CM | POA: Diagnosis not present

## 2016-05-26 DIAGNOSIS — F17210 Nicotine dependence, cigarettes, uncomplicated: Secondary | ICD-10-CM | POA: Diagnosis not present

## 2016-05-26 DIAGNOSIS — J449 Chronic obstructive pulmonary disease, unspecified: Secondary | ICD-10-CM | POA: Insufficient documentation

## 2016-05-26 DIAGNOSIS — Z79899 Other long term (current) drug therapy: Secondary | ICD-10-CM | POA: Insufficient documentation

## 2016-05-26 DIAGNOSIS — R079 Chest pain, unspecified: Secondary | ICD-10-CM | POA: Diagnosis present

## 2016-05-26 LAB — CBC
HCT: 42.2 % (ref 39.0–52.0)
Hemoglobin: 14.5 g/dL (ref 13.0–17.0)
MCH: 31.6 pg (ref 26.0–34.0)
MCHC: 34.4 g/dL (ref 30.0–36.0)
MCV: 91.9 fL (ref 78.0–100.0)
PLATELETS: 197 10*3/uL (ref 150–400)
RBC: 4.59 MIL/uL (ref 4.22–5.81)
RDW: 12.6 % (ref 11.5–15.5)
WBC: 8.4 10*3/uL (ref 4.0–10.5)

## 2016-05-26 LAB — COMPREHENSIVE METABOLIC PANEL
ALT: 25 U/L (ref 17–63)
ANION GAP: 9 (ref 5–15)
AST: 20 U/L (ref 15–41)
Albumin: 4 g/dL (ref 3.5–5.0)
Alkaline Phosphatase: 69 U/L (ref 38–126)
BUN: 10 mg/dL (ref 6–20)
CHLORIDE: 105 mmol/L (ref 101–111)
CO2: 23 mmol/L (ref 22–32)
Calcium: 8.8 mg/dL — ABNORMAL LOW (ref 8.9–10.3)
Creatinine, Ser: 0.94 mg/dL (ref 0.61–1.24)
Glucose, Bld: 104 mg/dL — ABNORMAL HIGH (ref 65–99)
POTASSIUM: 3.6 mmol/L (ref 3.5–5.1)
Sodium: 137 mmol/L (ref 135–145)
Total Bilirubin: 1.1 mg/dL (ref 0.3–1.2)
Total Protein: 6.9 g/dL (ref 6.5–8.1)

## 2016-05-26 LAB — LIPASE, BLOOD: LIPASE: 15 U/L (ref 11–51)

## 2016-05-26 LAB — I-STAT TROPONIN, ED
TROPONIN I, POC: 0.01 ng/mL (ref 0.00–0.08)
TROPONIN I, POC: 0 ng/mL (ref 0.00–0.08)

## 2016-05-26 MED ORDER — GI COCKTAIL ~~LOC~~
30.0000 mL | Freq: Once | ORAL | Status: AC
  Administered 2016-05-26: 30 mL via ORAL
  Filled 2016-05-26: qty 30

## 2016-05-26 MED ORDER — ASPIRIN 81 MG PO CHEW
324.0000 mg | CHEWABLE_TABLET | Freq: Once | ORAL | Status: AC
  Administered 2016-05-26: 324 mg via ORAL
  Filled 2016-05-26: qty 4

## 2016-05-26 MED ORDER — NITROGLYCERIN 0.4 MG SL SUBL
0.4000 mg | SUBLINGUAL_TABLET | SUBLINGUAL | Status: AC | PRN
  Administered 2016-05-26 (×3): 0.4 mg via SUBLINGUAL
  Filled 2016-05-26: qty 1

## 2016-05-26 MED ORDER — NICOTINE 21 MG/24HR TD PT24
21.0000 mg | MEDICATED_PATCH | Freq: Once | TRANSDERMAL | Status: DC
  Administered 2016-05-26: 21 mg via TRANSDERMAL
  Filled 2016-05-26: qty 1

## 2016-05-26 MED ORDER — FENTANYL CITRATE (PF) 100 MCG/2ML IJ SOLN
25.0000 ug | Freq: Once | INTRAMUSCULAR | Status: AC
  Administered 2016-05-26: 25 ug via INTRAVENOUS
  Filled 2016-05-26: qty 2

## 2016-05-26 NOTE — ED Triage Notes (Addendum)
Chest pain since Friday.  Has had off and on chest pain for 4-6 weeks.  Has seen a cardiologist and is scheduled for a stress test Tuesday.  Also c/o left arm numbness. States his right arm was numb earlier and now it has moved to his left.

## 2016-05-26 NOTE — ED Provider Notes (Signed)
[facility] Provider Note   CSN: [id number] Arrival date & time: 05/26/16  1306   By signing my name below, I, [name], attest that this documentation has been prepared under the direction and in the presence of Nira Conn, MD. Electronically Signed: Bobbie Cooley, Scribe. 05/26/16. 2:06 PM. History   Chief Complaint Chief Complaint  Patient presents with  . Chest Pain    The history is provided by the patient and the spouse. No language interpreter was used.   HPI Comments: [name] is a 36 y.o. male who presents to the Emergency Department complaining of severe CP since Friday. He has been having CP for the past several months but not to this severity. He states that the pain feels like an elephant is standing on his chest. He denies nausea, vomiting and diarrhea. He reports that the pain worsens while ambulating. He is unaware of whether the pain worsens with food because he hasn't ate anything today. The pain is somewhat relieved when he is at rest. He also reports right arm numbness that has radiated to his left arm. He denies coughing, rhinorrhea, and congestion. He denies any hx of cardiac problems. He denies that any of his family members have had an MI before the age of 56. He is a current smoker that is trying to quit. He has a hx of HLD and HTN. He denies a hx of diabetes.  Past Medical History:  Diagnosis Date  . Anxiety   . Bipolar 2 disorder (HCC)   . Chronic abdominal pain    HIDA scan Sept 2010, EF 93%, s/p chole oct 2010; evaluated at [facility] for abdominal wall pain; Imipramine added May 2011  . Chronic vomiting    normal GES 2009  . Depression    increased over past several months  . GERD (gastroesophageal reflux disease)    Bravo study Nov 2009 on Prilosec 40 BID with adequate acid suppression  . Hiatal hernia   . Hypertension   . S/P endoscopy    2008 Dr. Karilyn Cota: erosive reflux esophagitis, antral gastritis, H.pylori  serologies neg, Nov 2009 Dr. Darrick Penna: gastritis, benign esophageal polyp, no H.pylori,  Feb 2011: [facility], Dr. Bubba Hales: normal esophagus, normal stomach, normal duodenum, path unremarkable  . Shortness of breath    SOB even with no exertion  . Skull fracture (HCC)   . Sleep apnea    supposed to sleep with CPAP  . Ventral hernia     Patient Active Problem List   Diagnosis Date Noted  . Intracranial hemorrhage (HCC) 01/20/2016  . Nausea without vomiting 12/15/2015  . Early satiety 07/25/2015  . Gastroesophageal reflux disease with esophagitis 06/27/2015  . Nausea vomiting and diarrhea 04/27/2015  . Transaminitis 04/27/2015  . Cough 06/23/2013  . Tobacco abuse counseling 06/23/2013  . Abdominal cramping 05/05/2013  . COPD (chronic obstructive pulmonary disease) with acute bronchitis (HCC) 05/05/2013  . Syncope 05/05/2013  . Depression with anxiety 05/05/2013  . Hyperglycemia 05/05/2013  . Black-out (not amnesia) 05/05/2013  . Smoking 04/16/2013  . Blackout spell 04/16/2013  . Hx of bipolar disorder 04/16/2013  . GERD (gastroesophageal reflux disease) 04/16/2013  . OSA (obstructive sleep apnea) 04/16/2013  . Dyspnea 04/16/2013  . Bipolar 2 disorder (HCC) 03/08/2013  . HTN (hypertension) 03/08/2013  . Tobacco abuse 03/08/2013  . Referred for management of medication therapy 03/08/2013  . Asthma exacerbation, non-allergic 03/08/2013  . Sore throat 03/08/2013  . Unspecified sleep apnea 03/08/2013  . Pain in joint, ankle  and foot 11/25/2012  . Hyperbilirubinemia 01/10/2012  . Ventral hernia without obstruction or gangrene 11/12/2011  . Obesity 11/12/2011  . Abdominal wall pain 03/06/2011  . NAUSEA WITH VOMITING 05/15/2009  . WEIGHT GAIN, ABNORMAL 03/21/2009  . EPIGASTRIC PAIN, CHRONIC 03/21/2009  . 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 SMOKER 03/20/2009  . DEPRESSION 03/20/2009  . HYPERTENSION 03/20/2009  . GERD 03/20/2009  . HEMATEMESIS 03/20/2009  . SHOULDER PAIN, RIGHT 03/20/2009  .  ANOREXIA 03/20/2009  . ABDOMINAL PAIN, GENERALIZED 03/20/2009  . CANNABIS ABUSE, HX OF 03/20/2009    Past Surgical History:  Procedure Laterality Date  . APPENDECTOMY  5/10  . BIOPSY  08/15/2015   Procedure: BIOPSY;  Surgeon: West Bali, MD;  Location: [facility];  Service: Endoscopy;;  gastric bx  . CHOLECYSTECTOMY  10/10  . ESOPHAGOGASTRODUODENOSCOPY  05/2006   H.pylori neg  . ESOPHAGOGASTRODUODENOSCOPY  03/23/2008   Fields-gastritis benign esophageal polyp, otherwise normal. Negative H. pylori he (propofol)  . ESOPHAGOGASTRODUODENOSCOPY (EGD) WITH PROPOFOL N/A 08/15/2015   Procedure: ESOPHAGOGASTRODUODENOSCOPY (EGD) WITH PROPOFOL;  Surgeon: West Bali, MD;  Location: [facility];  Service: Endoscopy;  Laterality: N/A;  3016  . INCISIONAL HERNIA REPAIR  03/04/2012   Procedure: LAPAROSCOPIC INCISIONAL HERNIA;  Surgeon: Dalia Heading, MD;  Location: [facility] OR[facility];  Service: General;  Laterality: N/A;  repair, recurrent  . INCISIONAL HERNIA REPAIR N/A 01/12/2014   Procedure: HERNIA REPAIR INCISIONAL WITH MESH;  Surgeon: Dalia Heading, MD;  Location: [facility];  Service: General;  Laterality: N/A;  . INGUINAL HERNIA REPAIR     child  . INSERTION OF MESH  03/04/2012   Procedure: INSERTION OF MESH;  Surgeon: Dalia Heading, MD;  Location: [facility];  Service: General;  Laterality: N/A;  . INSERTION OF MESH N/A 01/12/2014   Procedure: INSERTION OF MESH;  Surgeon: Dalia Heading, MD;  Location: [facility];  Service: General;  Laterality: N/A;  . KNEE SURGERY Right 2009  . VENTRAL HERNIA REPAIR  March 2012   Dr. Lovell Sheehan       Home Medications    Prior to Admission medications   Medication Sig Start Date End Date Taking? Authorizing Provider  ALPRAZolam Prudy Feeler) 1 MG tablet Take 1 mg by mouth 4 (four) times daily.    Yes Historical Provider, MD  Brexpiprazole (REXULTI) 2 MG TABS Take 2 mg by mouth daily.   Yes Historical Provider, MD  budesonide-formoterol (SYMBICORT) 160-4.5 MCG/ACT inhaler  Inhale 2 puffs into the lungs 2 (two) times daily.   Yes Historical Provider, MD  donepezil (ARICEPT) 5 MG tablet Take 5 mg by mouth every morning.   Yes Historical Provider, MD  fluticasone (FLONASE) 50 MCG/ACT nasal spray Place 2 sprays into both nostrils daily.   Yes Historical Provider, MD  HYDROcodone-acetaminophen (NORCO) 7.5-325 MG tablet One every six hours as needed for pain.  Seven day limit per Medicaid guidelines. Patient taking differently: Take 1 tablet by mouth every 6 (six) hours as needed for moderate pain or severe pain. One every six hours as needed for pain.  Seven day limit per Medicaid guidelines. 05/08/16  Yes Darreld Mclean, MD  lisinopril (PRINIVIL,ZESTRIL) 40 MG tablet Take 40 mg by mouth daily.   Yes Historical Provider, MD  omeprazole (PRILOSEC) 40 MG capsule Take 40 mg by mouth 2 (two) times daily.   Yes Historical Provider, MD  rizatriptan (MAXALT) 10 MG tablet Take 10 mg by mouth as needed for migraine. May repeat in 2 hours if needed  Yes Historical Provider, MD    Family History Family History  Problem Relation Age of Onset  . Diabetes Mother   . CAD Mother   . CAD Father   . Colon cancer Neg Hx     Social History Social History  Substance Use Topics  . Smoking status: Current Every Day Smoker    Packs/day: 1.00    Years: 15.00    Types: Cigarettes  . Smokeless tobacco: Never Used     Comment: Less than 1/2 pk a day. trying to quit.  03/04/12 1255 states "was doing good, but now up to about a pack per day"  . Alcohol use No     Allergies   Dexilant [dexlansoprazole]; Propoxyphene n-acetaminophen; Zofran odt [ondansetron]; and Zofran [ondansetron hcl]   Review of Systems Review of Systems A complete 10 system review of systems was obtained and all systems are negative except as noted in the HPI and PMH.    Physical Exam Updated Vital Signs BP 132/89   Pulse 74   Resp 16   Ht 5\' 9"  (1.753 m)   Wt (!) 302 lb (137 kg)   SpO2 95%   BMI 44.60  kg/m   Physical Exam  Constitutional: He is oriented to person, place, and time. He appears well-developed and well-nourished. No distress.  obese  HENT:  Head: Normocephalic and atraumatic.  Nose: Nose normal.  Eyes: Conjunctivae and EOM are normal. Pupils are equal, round, and reactive to light. Right eye exhibits no discharge. Left eye exhibits no discharge. No scleral icterus.  Neck: Normal range of motion. Neck supple.  Cardiovascular: Normal rate and regular rhythm.  Exam reveals no gallop and no friction rub.   No murmur heard. Pulmonary/Chest: Effort normal and breath sounds normal. No stridor. No respiratory distress. He has no rales.  Abdominal: Soft. He exhibits no distension. There is no tenderness.  Musculoskeletal: He exhibits no edema or tenderness.  Neurological: He is alert and oriented to person, place, and time.  Skin: Skin is warm and dry. No rash noted. He is not diaphoretic. No erythema.  Psychiatric: He has a normal mood and affect.  Vitals reviewed.    ED Treatments / Results  DIAGNOSTIC STUDIES: Oxygen Saturation is 95% on RA, normal by my interpretation.    COORDINATION OF CARE: 2:06 PM Discussed treatment plan with pt at bedside and pt agreed to plan.  Labs (all labs ordered are listed, but only abnormal results are displayed) Labs Reviewed  COMPREHENSIVE METABOLIC PANEL - Abnormal; Notable for the following:       Result Value   Glucose, Bld 104 (*)    Calcium 8.8 (*)    All other components within normal limits  CBC  LIPASE, BLOOD  I-STAT TROPOININ, ED  I-STAT TROPOININ, ED    EKG  EKG Interpretation  Date/Time:  Sunday May 26 2016 13:21:12 EST Ventricular Rate:  71 PR Interval:    QRS Duration: 95 QT Interval:  384 QTC Calculation: 418 R Axis:   83 Text Interpretation:  Sinus rhythm Borderline T wave abnormalities Baseline wander in lead(s) V2 V3 No significant change since last tracing Confirmed by [facility] MD, Jalesha Plotz ([id number]) on  05/26/2016 1:30:31 PM       Radiology Dg Chest 2 View  Result Date: 05/26/2016 CLINICAL DATA:  Chest pain. EXAM: CHEST  2 VIEW COMPARISON:  May 11, 2016 FINDINGS: Minimal atelectasis in the left base. The cardiomediastinal silhouette is stable. No pulmonary nodules, masses, or suspicious infiltrates. No  acute abnormalities. IMPRESSION: No active cardiopulmonary disease. Electronically Signed   By: Gerome Sam III M.D   On: 05/26/2016 15:17    Procedures Procedures (including critical care time)  Medications Ordered in ED Medications  nicotine (NICODERM CQ - dosed in mg/24 hours) patch 21 mg (21 mg Transdermal Patch Applied 05/26/16 1739)  gi cocktail (Maalox,Lidocaine,Donnatal) (30 mLs Oral Given 05/26/16 1419)  aspirin chewable tablet 324 mg (324 mg Oral Given 05/26/16 1419)  nitroGLYCERIN (NITROSTAT) SL tablet 0.4 mg (0.4 mg Sublingual Given 05/26/16 1533)  fentaNYL (SUBLIMAZE) injection 25 mcg (25 mcg Intravenous Given 05/26/16 1739)     Initial Impression / Assessment and Plan / ED Course  I have reviewed the triage vital signs and the nursing notes.  Pertinent labs & imaging results that were available during my care of the patient were reviewed by me and considered in my medical decision making (see chart for details).  Clinical Course as of May 27 1907  Wynelle Link May 26, 2016  4540 Atypical chest pain. EKG w/o acute changes. Chest x-ray without evidence suggestive of pneumonia, pneumothorax, pneumomediastinum.  No abnormal contour of the mediastinum to suggest dissection. No evidence of acute injuries. HEAR <4. Serial Trop negative x2. Already scheduled for stress testing with cardiology.  Low suspicion for PE. Not classic for dissection or esophageal rupture.  The patient is safe for discharge with strict return precautions.    [PC]    Clinical Course User Index [PC] Nira Conn, MD      Final Clinical Impressions(s) / ED Diagnoses   Final diagnoses:    Chest pain  Nonspecific chest pain   Disposition: Discharge  Condition: Good  I have discussed the results, Dx and Tx plan with the patient who expressed understanding and agree(s) with the plan. Discharge instructions discussed at great length. The patient was given strict return precautions who verbalized understanding of the instructions. No further questions at time of discharge.    New Prescriptions   No medications on file    Follow Up: Avon Gully, MD [address] [place] [address] [phone]  Schedule an appointment as soon as possible for a visit  As needed  Cardiology  In 2 days for stress testing as scheduled   I personally performed the services described in this documentation, which was scribed in my presence. The recorded information has been reviewed and is accurate.        Nira Conn, MD 05/26/16 [phone]

## 2016-05-28 ENCOUNTER — Encounter (HOSPITAL_COMMUNITY)
Admission: RE | Admit: 2016-05-28 | Discharge: 2016-05-28 | Disposition: A | Discharge status: Home / Self Care | Payer: Medicaid Other | Source: Ambulatory Visit | Attending: Cardiovascular Disease | Admitting: Cardiovascular Disease

## 2016-05-28 ENCOUNTER — Encounter (HOSPITAL_COMMUNITY): Payer: Self-pay

## 2016-05-28 ENCOUNTER — Inpatient Hospital Stay (HOSPITAL_COMMUNITY): Admission: RE | Admit: 2016-05-28 | Payer: Medicaid Other | Source: Ambulatory Visit

## 2016-05-28 DIAGNOSIS — R072 Precordial pain: Secondary | ICD-10-CM | POA: Insufficient documentation

## 2016-05-28 MED ORDER — SODIUM CHLORIDE 0.9% FLUSH
INTRAVENOUS | Status: AC
  Administered 2016-05-28: 10 mL via INTRAVENOUS
  Filled 2016-05-28: qty 10

## 2016-05-28 MED ORDER — TECHNETIUM TC 99M TETROFOSMIN IV KIT
30.0000 | PACK | Freq: Once | INTRAVENOUS | Status: AC | PRN
  Administered 2016-05-29: 32.5 via INTRAVENOUS

## 2016-05-28 MED ORDER — REGADENOSON 0.4 MG/5ML IV SOLN
INTRAVENOUS | Status: AC
  Administered 2016-05-28: 0.4 mg via INTRAVENOUS
  Filled 2016-05-28: qty 5

## 2016-05-29 ENCOUNTER — Encounter (HOSPITAL_COMMUNITY): Payer: Self-pay

## 2016-05-29 ENCOUNTER — Encounter (HOSPITAL_COMMUNITY)
Admission: RE | Admit: 2016-05-29 | Discharge: 2016-05-29 | Disposition: A | Discharge status: Home / Self Care | Payer: Medicaid Other | Source: Ambulatory Visit | Attending: Cardiovascular Disease | Admitting: Cardiovascular Disease

## 2016-05-29 DIAGNOSIS — R072 Precordial pain: Secondary | ICD-10-CM | POA: Diagnosis not present

## 2016-05-29 LAB — NM MYOCAR MULTI W/SPECT W/WALL MOTION / EF
CHL CUP NUCLEAR SDS: 10
CHL CUP RESTING HR STRESS: 83 {beats}/min
LHR: 0.36
LV dias vol: 70 mL (ref 62–150)
LV sys vol: 28 mL
NUC STRESS TID: 0.68
Peak HR: 122 {beats}/min
SRS: 0
SSS: 10

## 2016-05-29 MED ORDER — TECHNETIUM TC 99M TETROFOSMIN IV KIT
25.0000 | PACK | Freq: Once | INTRAVENOUS | Status: AC | PRN
  Administered 2016-05-29: 24.7 via INTRAVENOUS

## 2016-05-30 ENCOUNTER — Other Ambulatory Visit: Payer: Self-pay

## 2016-05-30 MED ORDER — NITROGLYCERIN 0.4 MG SL SUBL: 0.4000 mg | SUBLINGUAL_TABLET | SUBLINGUAL | 3 refills | Status: AC | PRN

## 2016-05-30 MED ORDER — LISINOPRIL 40 MG PO TABS: 40.0000 mg | ORAL_TABLET | Freq: Every day | ORAL | 3 refills | Status: DC

## 2016-05-31 ENCOUNTER — Other Ambulatory Visit: Payer: Self-pay

## 2016-05-31 MED ORDER — LISINOPRIL 40 MG PO TABS: 40.0000 mg | ORAL_TABLET | Freq: Every day | ORAL | 2 refills | Status: DC

## 2016-05-31 NOTE — Telephone Encounter (Signed)
Refilled prinivil 40 mg

## 2016-06-04 ENCOUNTER — Encounter: Payer: Self-pay | Admitting: Internal Medicine

## 2016-06-05 ENCOUNTER — Ambulatory Visit: Payer: Medicaid Other | Admitting: Orthopaedic Surgery

## 2016-06-06 ENCOUNTER — Ambulatory Visit (INDEPENDENT_AMBULATORY_CARE_PROVIDER_SITE_OTHER): Payer: Medicaid Other | Admitting: Otolaryngology

## 2016-06-06 DIAGNOSIS — H9041 Sensorineural hearing loss, unilateral, right ear, with unrestricted hearing on the contralateral side: Secondary | ICD-10-CM

## 2016-06-07 ENCOUNTER — Encounter: Payer: Self-pay | Admitting: Cardiovascular Disease

## 2016-06-07 ENCOUNTER — Other Ambulatory Visit: Payer: Self-pay | Admitting: Cardiovascular Disease

## 2016-06-07 ENCOUNTER — Telehealth: Payer: Self-pay | Admitting: Cardiovascular Disease

## 2016-06-07 ENCOUNTER — Ambulatory Visit (INDEPENDENT_AMBULATORY_CARE_PROVIDER_SITE_OTHER): Payer: Medicaid Other | Admitting: Cardiovascular Disease

## 2016-06-07 ENCOUNTER — Encounter: Payer: Self-pay | Admitting: *Deleted

## 2016-06-07 VITALS — BP 138/70 | HR 80 | Ht 69.0 in | Wt 307.0 lb

## 2016-06-07 DIAGNOSIS — R9439 Abnormal result of other cardiovascular function study: Secondary | ICD-10-CM

## 2016-06-07 DIAGNOSIS — R072 Precordial pain: Secondary | ICD-10-CM | POA: Diagnosis not present

## 2016-06-07 DIAGNOSIS — Z720 Tobacco use: Secondary | ICD-10-CM

## 2016-06-07 DIAGNOSIS — I10 Essential (primary) hypertension: Secondary | ICD-10-CM | POA: Diagnosis not present

## 2016-06-07 DIAGNOSIS — R079 Chest pain, unspecified: Secondary | ICD-10-CM

## 2016-06-07 MED ORDER — METOPROLOL TARTRATE 25 MG PO TABS: 25.0000 mg | ORAL_TABLET | Freq: Two times a day (BID) | ORAL | 3 refills | Status: DC

## 2016-06-07 NOTE — Patient Instructions (Signed)
Your physician recommends that you schedule a follow-up appointment in: 1 MONTH WITH DR. Purvis Sheffield  Your physician has recommended you make the following change in your medication:   START METOPROLOL 25 MG TWICE DAILY  Your physician has requested that you have a cardiac catheterization. Cardiac catheterization is used to diagnose and/or treat various heart conditions. Doctors may recommend this procedure for a number of different reasons. The most common reason is to evaluate chest pain. Chest pain can be a symptom of coronary artery disease (CAD), and cardiac catheterization can show whether plaque is narrowing or blocking your heart's arteries. This procedure is also used to evaluate the valves, as well as measure the blood flow and oxygen levels in different parts of your heart. For further information please visit [email or web address] Please follow instruction sheet, as given.  Thank you for choosing [facility]!!

## 2016-06-07 NOTE — Telephone Encounter (Signed)
[facility] 06/12/16 @8 :30 AM VARANASI

## 2016-06-07 NOTE — Progress Notes (Signed)
SUBJECTIVE: The patient returns for follow-up after undergoing cardiovascular testing performed for the evaluation of chest pain.  Nuclear stress testing demonstrated the following:  Small, moderate intensity, apical lateral and basal inferior/inferoseptal defects that are partially reversible. Ischemia is suggested particularly in the inferior/inferoseptal distribution, although there does appear to be associated soft tissue attenuation affecting the study.  This is an intermediate risk study.  Nuclear stress EF: 60%.  He has been taking aspirin 81 mg daily. He has continued to have chest pain. One episode occurred while walking and another episode occurred while splitting wood. It was located in the left precordium with radiation down his left arm. It lasted 15 minutes and he took 2 nitroglycerin with relief. An additional episode awoke him from sleep. He is very worried. He does have a history of anxiety as well but says these symptoms are very different than his typical anxiety attacks.    Review of Systems: As per "subjective", otherwise negative.  Allergies  Allergen Reactions  . Dexilant [Dexlansoprazole] Other (See Comments)    Pass out  . Propoxyphene N-Acetaminophen Other (See Comments)    Unknown   . Zofran Odt [Ondansetron]     vomit  . Zofran [Ondansetron Hcl] Nausea And Vomiting    Current Outpatient Prescriptions  Medication Sig Dispense Refill  . ALPRAZolam (XANAX) 1 MG tablet Take 1 mg by mouth 4 (four) times daily.     . Brexpiprazole (REXULTI) 2 MG TABS Take 2 mg by mouth daily.    . budesonide-formoterol (SYMBICORT) 160-4.5 MCG/ACT inhaler Inhale 2 puffs into the lungs 2 (two) times daily.    Marland Kitchen donepezil (ARICEPT) 5 MG tablet Take 5 mg by mouth every morning.    . fluticasone (FLONASE) 50 MCG/ACT nasal spray Place 2 sprays into both nostrils daily.    Marland Kitchen HYDROcodone-acetaminophen (NORCO) 7.5-325 MG tablet One every six hours as needed for pain.  Seven  day limit per Medicaid guidelines. (Patient taking differently: Take 1 tablet by mouth every 6 (six) hours as needed for moderate pain or severe pain. One every six hours as needed for pain.  Seven day limit per Medicaid guidelines.) 28 tablet 0  . lisinopril (PRINIVIL,ZESTRIL) 40 MG tablet Take 1 tablet (40 mg total) by mouth daily. 90 tablet 2  . nitroGLYCERIN (NITROSTAT) 0.4 MG SL tablet Place 1 tablet (0.4 mg total) under the tongue every 5 (five) minutes as needed for chest pain. 25 tablet 3  . omeprazole (PRILOSEC) 40 MG capsule Take 40 mg by mouth 2 (two) times daily.    . rizatriptan (MAXALT) 10 MG tablet Take 10 mg by mouth as needed for migraine. May repeat in 2 hours if needed    . traMADol (ULTRAM) 50 MG tablet Take 50 mg by mouth every 6 (six) hours as needed.     No current facility-administered medications for this visit.     Past Medical History:  Diagnosis Date  . Anxiety   . Bipolar 2 disorder (HCC)   . Chronic abdominal pain    HIDA scan Sept 2010, EF 93%, s/p chole oct 2010; evaluated at [facility] for abdominal wall pain; Imipramine added May 2011  . Chronic vomiting    normal GES 2009  . Depression    increased over past several months  . GERD (gastroesophageal reflux disease)    Bravo study Nov 2009 on Prilosec 40 BID with adequate acid suppression  . Hiatal hernia   . Hypertension   . S/P endoscopy  2008 Dr. Karilyn Cota: erosive reflux esophagitis, antral gastritis, H.pylori serologies neg, Nov 2009 Dr. Darrick Penna: gastritis, benign esophageal polyp, no H.pylori,  Feb 2011: [facility], Dr. Bubba Hales: normal esophagus, normal stomach, normal duodenum, path unremarkable  . Shortness of breath    SOB even with no exertion  . Skull fracture (HCC)   . Sleep apnea    supposed to sleep with CPAP  . Ventral hernia     Past Surgical History:  Procedure Laterality Date  . APPENDECTOMY  5/10  . BIOPSY  08/15/2015   Procedure: BIOPSY;  Surgeon: West Bali, MD;  [facility]: [facility];  Service: Endoscopy;;  gastric bx  . CHOLECYSTECTOMY  10/10  . ESOPHAGOGASTRODUODENOSCOPY  05/2006   H.pylori neg  . ESOPHAGOGASTRODUODENOSCOPY  03/23/2008   Fields-gastritis benign esophageal polyp, otherwise normal. Negative H. pylori he (propofol)  . ESOPHAGOGASTRODUODENOSCOPY (EGD) WITH PROPOFOL N/A 08/15/2015   Procedure: ESOPHAGOGASTRODUODENOSCOPY (EGD) WITH PROPOFOL;  Surgeon: West Bali, MD;  Location: [facility];  Service: Endoscopy;  Laterality: N/A;  9147  . INCISIONAL HERNIA REPAIR  03/04/2012   Procedure: LAPAROSCOPIC INCISIONAL HERNIA;  Surgeon: Dalia Heading, MD;  Location: [facility];  Service: General;  Laterality: N/A;  repair, recurrent  . INCISIONAL HERNIA REPAIR N/A 01/12/2014   Procedure: HERNIA REPAIR INCISIONAL WITH MESH;  Surgeon: Dalia Heading, MD;  Location: [facility];  Service: General;  Laterality: N/A;  . INGUINAL HERNIA REPAIR     child  . INSERTION OF MESH  03/04/2012   Procedure: INSERTION OF MESH;  Surgeon: Dalia Heading, MD;  Location: [facility];  Service: General;  Laterality: N/A;  . INSERTION OF MESH N/A 01/12/2014   Procedure: INSERTION OF MESH;  Surgeon: Dalia Heading, MD;  Location: [facility];  Service: General;  Laterality: N/A;  . KNEE SURGERY Right 2009  . VENTRAL HERNIA REPAIR  March 2012   Dr. Lovell Sheehan    Social History   Social History  . Marital status: Single    Spouse name: N/A  . Number of children: 1  . Years of education: N/A   Occupational History  . disabled     applying for disability, depression  .  Not Employed   Social History Main Topics  . Smoking status: Current Every Day Smoker    Packs/day: 1.00    Years: 15.00    Types: Cigarettes  . Smokeless tobacco: Never Used     Comment: Less than 1/2 pk a day. trying to quit.  03/04/12 1255 states "was doing good, but now up to about a pack per day"  . Alcohol use No  . Drug use: No  . Sexual activity: No   Other Topics Concern  . Not on file   Social History  Narrative   Lives alone     Vitals:   06/07/16 1046  BP: 138/70  Pulse: 80  SpO2: 98%  Weight: (!) 307 lb (139.3 kg)  Height: 5\' 9"  (1.753 m)    PHYSICAL EXAM General: NAD Neck: No JVD, no thyromegaly or thyroid nodule.  Lungs: Clear to auscultation bilaterally with normal respiratory effort. CV: Nondisplaced PMI. Regular rate and rhythm, normal S1/S2, no S3/S4, no murmur.  No peripheral edema.   Abdomen: Firm, obese.  Skin: Intact without lesions or rashes.  Neurologic: Alert and oriented x 3.  Psych: Flat affect. Extremities: No clubbing or cyanosis.  HEENT: Normal.     ECG: Most recent ECG reviewed.      ASSESSMENT AND  PLAN: 1. Chest pain/abnormal stress test: Appears to have had a progression of symptoms. Risk factors include hypertension and tobacco abuse. Abnormal Lexiscan Myoview stress test as detailed above. While there is soft tissue attenuation artifact noted, ischemia is also suggested.  I spoke to him about medical management including the addition of metoprolol. He prefers to undergo cardiac catheterization. I will arrange for coronary angiography. I will prescribe metoprolol 25 mg twice daily. He should continue aspirin 81 mg and nitroglycerin as needed.  2. Hypertension: Controlled on lisinopril 40 mg. No changes.  3. Tobacco abuse  Dispo: fu 1 month.  Prentice Docker, M.D., F.A.C.C.

## 2016-06-10 ENCOUNTER — Other Ambulatory Visit: Payer: Self-pay | Admitting: Cardiovascular Disease

## 2016-06-10 ENCOUNTER — Ambulatory Visit: Payer: Medicaid Other | Admitting: Cardiovascular Disease

## 2016-06-10 NOTE — Telephone Encounter (Signed)
Pt has Medicaid only.  No precert required for heart cath °

## 2016-06-11 ENCOUNTER — Telehealth: Payer: Self-pay

## 2016-06-11 ENCOUNTER — Ambulatory Visit: Payer: Medicaid Other | Admitting: Orthopaedic Surgery

## 2016-06-11 LAB — CBC
HEMATOCRIT: 45.7 % (ref 38.5–50.0)
Hemoglobin: 15.2 g/dL (ref 13.2–17.1)
MCH: 30.6 pg (ref 27.0–33.0)
MCHC: 33.3 g/dL (ref 32.0–36.0)
MCV: 92.1 fL (ref 80.0–100.0)
MPV: 9.8 fL (ref 7.5–12.5)
Platelets: 276 10*3/uL (ref 140–400)
RBC: 4.96 MIL/uL (ref 4.20–5.80)
RDW: 13.6 % (ref 11.0–15.0)
WBC: 10.4 10*3/uL (ref 3.8–10.8)

## 2016-06-11 LAB — BASIC METABOLIC PANEL
BUN: 13 mg/dL (ref 7–25)
CALCIUM: 9.3 mg/dL (ref 8.6–10.3)
CO2: 28 mmol/L (ref 20–31)
Chloride: 103 mmol/L (ref 98–110)
Creat: 1.24 mg/dL (ref 0.60–1.35)
GLUCOSE: 103 mg/dL — AB (ref 65–99)
POTASSIUM: 4.2 mmol/L (ref 3.5–5.3)
SODIUM: 138 mmol/L (ref 135–146)

## 2016-06-11 LAB — PROTIME-INR
INR: 0.9
Prothrombin Time: 9.7 s (ref 9.0–11.5)

## 2016-06-11 NOTE — Telephone Encounter (Signed)
-----   Message from Laqueta Linden, MD sent at 06/11/2016  1:31 PM EST ----- Ok.

## 2016-06-12 ENCOUNTER — Encounter (HOSPITAL_COMMUNITY)
Admission: RE | Disposition: A | Discharge status: Home / Self Care | Payer: Self-pay | Source: Ambulatory Visit | Attending: Interventional Cardiology

## 2016-06-12 ENCOUNTER — Encounter (HOSPITAL_COMMUNITY): Payer: Self-pay | Admitting: Interventional Cardiology

## 2016-06-12 ENCOUNTER — Ambulatory Visit (HOSPITAL_COMMUNITY)
Admission: RE | Admit: 2016-06-12 | Discharge: 2016-06-12 | Disposition: A | Discharge status: Home / Self Care | Payer: Medicaid Other | Source: Ambulatory Visit | Attending: Interventional Cardiology | Admitting: Interventional Cardiology

## 2016-06-12 DIAGNOSIS — Z8719 Personal history of other diseases of the digestive system: Secondary | ICD-10-CM | POA: Diagnosis not present

## 2016-06-12 DIAGNOSIS — R109 Unspecified abdominal pain: Secondary | ICD-10-CM | POA: Insufficient documentation

## 2016-06-12 DIAGNOSIS — R079 Chest pain, unspecified: Secondary | ICD-10-CM | POA: Diagnosis not present

## 2016-06-12 DIAGNOSIS — Z7982 Long term (current) use of aspirin: Secondary | ICD-10-CM | POA: Insufficient documentation

## 2016-06-12 DIAGNOSIS — G8929 Other chronic pain: Secondary | ICD-10-CM | POA: Diagnosis not present

## 2016-06-12 DIAGNOSIS — K449 Diaphragmatic hernia without obstruction or gangrene: Secondary | ICD-10-CM | POA: Insufficient documentation

## 2016-06-12 DIAGNOSIS — Z7951 Long term (current) use of inhaled steroids: Secondary | ICD-10-CM | POA: Insufficient documentation

## 2016-06-12 DIAGNOSIS — K219 Gastro-esophageal reflux disease without esophagitis: Secondary | ICD-10-CM | POA: Insufficient documentation

## 2016-06-12 DIAGNOSIS — F17210 Nicotine dependence, cigarettes, uncomplicated: Secondary | ICD-10-CM | POA: Insufficient documentation

## 2016-06-12 DIAGNOSIS — F419 Anxiety disorder, unspecified: Secondary | ICD-10-CM | POA: Diagnosis not present

## 2016-06-12 DIAGNOSIS — R9439 Abnormal result of other cardiovascular function study: Secondary | ICD-10-CM | POA: Diagnosis not present

## 2016-06-12 DIAGNOSIS — F3181 Bipolar II disorder: Secondary | ICD-10-CM | POA: Diagnosis not present

## 2016-06-12 DIAGNOSIS — G4730 Sleep apnea, unspecified: Secondary | ICD-10-CM | POA: Diagnosis not present

## 2016-06-12 DIAGNOSIS — I10 Essential (primary) hypertension: Secondary | ICD-10-CM | POA: Diagnosis not present

## 2016-06-12 HISTORY — PX: LEFT HEART CATH AND CORONARY ANGIOGRAPHY: CATH118249

## 2016-06-12 SURGERY — LEFT HEART CATH AND CORONARY ANGIOGRAPHY
Anesthesia: LOCAL

## 2016-06-12 MED ORDER — ALPRAZOLAM 0.25 MG PO TABS
1.0000 mg | ORAL_TABLET | Freq: Once | ORAL | Status: AC | PRN
  Administered 2016-06-12: 1 mg via ORAL

## 2016-06-12 MED ORDER — ASPIRIN 81 MG PO CHEW
81.0000 mg | CHEWABLE_TABLET | ORAL | Status: AC
  Administered 2016-06-12: 81 mg via ORAL

## 2016-06-12 MED ORDER — SODIUM CHLORIDE 0.9% FLUSH: 3.0000 mL | INTRAVENOUS | Status: DC | PRN

## 2016-06-12 MED ORDER — SODIUM CHLORIDE 0.9 % WEIGHT BASED INFUSION
3.0000 mL/kg/h | INTRAVENOUS | Status: DC
  Administered 2016-06-12: 3 mL/kg/h via INTRAVENOUS

## 2016-06-12 MED ORDER — HEPARIN SODIUM (PORCINE) 1000 UNIT/ML IJ SOLN
INTRAMUSCULAR | Status: DC | PRN
  Administered 2016-06-12: 6000 [IU] via INTRAVENOUS

## 2016-06-12 MED ORDER — HEPARIN (PORCINE) IN NACL 2-0.9 UNIT/ML-% IJ SOLN
INTRAMUSCULAR | Status: AC
  Filled 2016-06-12: qty 1000

## 2016-06-12 MED ORDER — FENTANYL CITRATE (PF) 100 MCG/2ML IJ SOLN
INTRAMUSCULAR | Status: AC
  Filled 2016-06-12: qty 2

## 2016-06-12 MED ORDER — SODIUM CHLORIDE 0.9% FLUSH: 3.0000 mL | Freq: Two times a day (BID) | INTRAVENOUS | Status: DC

## 2016-06-12 MED ORDER — MIDAZOLAM HCL 2 MG/2ML IJ SOLN
INTRAMUSCULAR | Status: AC
  Filled 2016-06-12: qty 2

## 2016-06-12 MED ORDER — IOPAMIDOL (ISOVUE-370) INJECTION 76%
INTRAVENOUS | Status: DC | PRN
  Administered 2016-06-12: 70 mL via INTRA_ARTERIAL

## 2016-06-12 MED ORDER — FENTANYL CITRATE (PF) 100 MCG/2ML IJ SOLN
INTRAMUSCULAR | Status: DC | PRN
  Administered 2016-06-12 (×3): 25 ug via INTRAVENOUS

## 2016-06-12 MED ORDER — HEPARIN SODIUM (PORCINE) 1000 UNIT/ML IJ SOLN
INTRAMUSCULAR | Status: AC
Start: 2016-06-12 — End: ?
  Filled 2016-06-12: qty 1

## 2016-06-12 MED ORDER — SODIUM CHLORIDE 0.9 % WEIGHT BASED INFUSION: 1.0000 mL/kg/h | INTRAVENOUS | Status: DC

## 2016-06-12 MED ORDER — LIDOCAINE HCL (PF) 1 % IJ SOLN
INTRAMUSCULAR | Status: DC | PRN
  Administered 2016-06-12: 2 mL

## 2016-06-12 MED ORDER — ASPIRIN 81 MG PO CHEW
CHEWABLE_TABLET | ORAL | Status: AC
  Filled 2016-06-12: qty 1

## 2016-06-12 MED ORDER — IOPAMIDOL (ISOVUE-370) INJECTION 76%
INTRAVENOUS | Status: AC
  Filled 2016-06-12: qty 100

## 2016-06-12 MED ORDER — SODIUM CHLORIDE 0.9 % IV SOLN: 250.0000 mL | INTRAVENOUS | Status: DC | PRN

## 2016-06-12 MED ORDER — MIDAZOLAM HCL 2 MG/2ML IJ SOLN
INTRAMUSCULAR | Status: DC | PRN
  Administered 2016-06-12 (×2): 1 mg via INTRAVENOUS
  Administered 2016-06-12: 2 mg via INTRAVENOUS

## 2016-06-12 MED ORDER — LIDOCAINE HCL (PF) 1 % IJ SOLN
INTRAMUSCULAR | Status: AC
  Filled 2016-06-12: qty 30

## 2016-06-12 MED ORDER — VERAPAMIL HCL 2.5 MG/ML IV SOLN
INTRAVENOUS | Status: DC | PRN
  Administered 2016-06-12: 10 mL via INTRA_ARTERIAL

## 2016-06-12 MED ORDER — HEPARIN (PORCINE) IN NACL 2-0.9 UNIT/ML-% IJ SOLN
INTRAMUSCULAR | Status: DC | PRN
  Administered 2016-06-12: 1000 mL

## 2016-06-12 MED ORDER — ALPRAZOLAM 0.25 MG PO TABS
ORAL_TABLET | ORAL | Status: AC
  Filled 2016-06-12: qty 4

## 2016-06-12 MED ORDER — VERAPAMIL HCL 2.5 MG/ML IV SOLN
INTRAVENOUS | Status: AC
  Filled 2016-06-12: qty 2

## 2016-06-12 MED ORDER — SODIUM CHLORIDE 0.9 % IV SOLN: INTRAVENOUS | Status: DC

## 2016-06-12 SURGICAL SUPPLY — 14 items
CATH EXPO 5FR FR4 (CATHETERS) ×1 IMPLANT
CATH INFINITI 5 FR JL3.5 (CATHETERS) ×1 IMPLANT
CATH INFINITI 5FR ANG PIGTAIL (CATHETERS) ×1 IMPLANT
CATH LAUNCHER 5F EBU3.0 (CATHETERS) IMPLANT
CATHETER LAUNCHER 5F EBU3.0 (CATHETERS) ×2
DEVICE RAD COMP TR BAND LRG (VASCULAR PRODUCTS) ×1 IMPLANT
GLIDESHEATH SLEND SS 6F .021 (SHEATH) ×1 IMPLANT
GUIDEWIRE INQWIRE 1.5J.035X260 (WIRE) IMPLANT
INQWIRE 1.5J .035X260CM (WIRE) ×2
KIT HEART LEFT (KITS) ×2 IMPLANT
PACK CARDIAC CATHETERIZATION (CUSTOM PROCEDURE TRAY) ×2 IMPLANT
SYR MEDRAD MARK V 150ML (SYRINGE) ×2 IMPLANT
TRANSDUCER W/STOPCOCK (MISCELLANEOUS) ×2 IMPLANT
TUBING CIL FLEX 10 FLL-RA (TUBING) ×2 IMPLANT

## 2016-06-12 NOTE — H&P (View-Only) (Signed)
SUBJECTIVE: The patient returns for follow-up after undergoing cardiovascular testing performed for the evaluation of chest pain.  Nuclear stress testing demonstrated the following:  Small, moderate intensity, apical lateral and basal inferior/inferoseptal defects that are partially reversible. Ischemia is suggested particularly in the inferior/inferoseptal distribution, although there does appear to be associated soft tissue attenuation affecting the study.  This is an intermediate risk study.  Nuclear stress EF: 60%.  He has been taking aspirin 81 mg daily. He has continued to have chest pain. One episode occurred while walking and another episode occurred while splitting wood. It was located in the left precordium with radiation down his left arm. It lasted 15 minutes and he took 2 nitroglycerin with relief. An additional episode awoke him from sleep. He is very worried. He does have a history of anxiety as well but says these symptoms are very different than his typical anxiety attacks.    Review of Systems: As per "subjective", otherwise negative.  Allergies  Allergen Reactions  . Dexilant [Dexlansoprazole] Other (See Comments)    Pass out  . Propoxyphene N-Acetaminophen Other (See Comments)    Unknown   . Zofran Odt [Ondansetron]     vomit  . Zofran [Ondansetron Hcl] Nausea And Vomiting    Current Outpatient Prescriptions  Medication Sig Dispense Refill  . ALPRAZolam (XANAX) 1 MG tablet Take 1 mg by mouth 4 (four) times daily.     . Brexpiprazole (REXULTI) 2 MG TABS Take 2 mg by mouth daily.    . budesonide-formoterol (SYMBICORT) 160-4.5 MCG/ACT inhaler Inhale 2 puffs into the lungs 2 (two) times daily.    Marland Kitchen donepezil (ARICEPT) 5 MG tablet Take 5 mg by mouth every morning.    . fluticasone (FLONASE) 50 MCG/ACT nasal spray Place 2 sprays into both nostrils daily.    Marland Kitchen HYDROcodone-acetaminophen (NORCO) 7.5-325 MG tablet One every six hours as needed for pain.  Seven  day limit per Medicaid guidelines. (Patient taking differently: Take 1 tablet by mouth every 6 (six) hours as needed for moderate pain or severe pain. One every six hours as needed for pain.  Seven day limit per Medicaid guidelines.) 28 tablet 0  . lisinopril (PRINIVIL,ZESTRIL) 40 MG tablet Take 1 tablet (40 mg total) by mouth daily. 90 tablet 2  . nitroGLYCERIN (NITROSTAT) 0.4 MG SL tablet Place 1 tablet (0.4 mg total) under the tongue every 5 (five) minutes as needed for chest pain. 25 tablet 3  . omeprazole (PRILOSEC) 40 MG capsule Take 40 mg by mouth 2 (two) times daily.    . rizatriptan (MAXALT) 10 MG tablet Take 10 mg by mouth as needed for migraine. May repeat in 2 hours if needed    . traMADol (ULTRAM) 50 MG tablet Take 50 mg by mouth every 6 (six) hours as needed.     No current facility-administered medications for this visit.     Past Medical History:  Diagnosis Date  . Anxiety   . Bipolar 2 disorder (HCC)   . Chronic abdominal pain    HIDA scan Sept 2010, EF 93%, s/p chole oct 2010; evaluated at [facility] for abdominal wall pain; Imipramine added May 2011  . Chronic vomiting    normal GES 2009  . Depression    increased over past several months  . GERD (gastroesophageal reflux disease)    Bravo study Nov 2009 on Prilosec 40 BID with adequate acid suppression  . Hiatal hernia   . Hypertension   . S/P endoscopy  2008 Dr. Karilyn Cota: erosive reflux esophagitis, antral gastritis, H.pylori serologies neg, Nov 2009 Dr. Darrick Penna: gastritis, benign esophageal polyp, no H.pylori,  Feb 2011: [facility], Dr. Bubba Hales: normal esophagus, normal stomach, normal duodenum, path unremarkable  . Shortness of breath    SOB even with no exertion  . Skull fracture (HCC)   . Sleep apnea    supposed to sleep with CPAP  . Ventral hernia     Past Surgical History:  Procedure Laterality Date  . APPENDECTOMY  5/10  . BIOPSY  08/15/2015   Procedure: BIOPSY;  Surgeon: West Bali, MD;  [facility];  Service: Endoscopy;;  gastric bx  . CHOLECYSTECTOMY  10/10  . ESOPHAGOGASTRODUODENOSCOPY  05/2006   H.pylori neg  . ESOPHAGOGASTRODUODENOSCOPY  03/23/2008   Fields-gastritis benign esophageal polyp, otherwise normal. Negative H. pylori he (propofol)  . ESOPHAGOGASTRODUODENOSCOPY (EGD) WITH PROPOFOL N/A 08/15/2015   Procedure: ESOPHAGOGASTRODUODENOSCOPY (EGD) WITH PROPOFOL;  Surgeon: West Bali, MD;  [facility];  Service: Endoscopy;  Laterality: N/A;  9147  . INCISIONAL HERNIA REPAIR  03/04/2012   Procedure: LAPAROSCOPIC INCISIONAL HERNIA;  Surgeon: Dalia Heading, MD;  Location: [facility];  Service: General;  Laterality: N/A;  repair, recurrent  . INCISIONAL HERNIA REPAIR N/A 01/12/2014   Procedure: HERNIA REPAIR INCISIONAL WITH MESH;  Surgeon: Dalia Heading, MD;  Location: [facility];  Service: General;  Laterality: N/A;  . INGUINAL HERNIA REPAIR     child  . INSERTION OF MESH  03/04/2012   Procedure: INSERTION OF MESH;  Surgeon: Dalia Heading, MD;  Location: [facility];  Service: General;  Laterality: N/A;  . INSERTION OF MESH N/A 01/12/2014   Procedure: INSERTION OF MESH;  Surgeon: Dalia Heading, MD;  Location: [facility];  Service: General;  Laterality: N/A;  . KNEE SURGERY Right 2009  . VENTRAL HERNIA REPAIR  March 2012   Dr. Lovell Sheehan    Social History   Social History  . Marital status: Single    Spouse name: N/A  . Number of children: 1  . Years of education: N/A   Occupational History  . disabled     applying for disability, depression  .  Not Employed   Social History Main Topics  . Smoking status: Current Every Day Smoker    Packs/day: 1.00    Years: 15.00    Types: Cigarettes  . Smokeless tobacco: Never Used     Comment: Less than 1/2 pk a day. trying to quit.  03/04/12 1255 states "was doing good, but now up to about a pack per day"  . Alcohol use No  . Drug use: No  . Sexual activity: No   Other Topics Concern  . Not on file   Social History  Narrative   Lives alone     Vitals:   06/07/16 1046  BP: 138/70  Pulse: 80  SpO2: 98%  Weight: (!) 307 lb (139.3 kg)  Height: 5\' 9"  (1.753 m)    PHYSICAL EXAM General: NAD Neck: No JVD, no thyromegaly or thyroid nodule.  Lungs: Clear to auscultation bilaterally with normal respiratory effort. CV: Nondisplaced PMI. Regular rate and rhythm, normal S1/S2, no S3/S4, no murmur.  No peripheral edema.   Abdomen: Firm, obese.  Skin: Intact without lesions or rashes.  Neurologic: Alert and oriented x 3.  Psych: Flat affect. Extremities: No clubbing or cyanosis.  HEENT: Normal.     ECG: Most recent ECG reviewed.      ASSESSMENT AND  PLAN: 1. Chest pain/abnormal stress test: Appears to have had a progression of symptoms. Risk factors include hypertension and tobacco abuse. Abnormal Lexiscan Myoview stress test as detailed above. While there is soft tissue attenuation artifact noted, ischemia is also suggested.  I spoke to him about medical management including the addition of metoprolol. He prefers to undergo cardiac catheterization. I will arrange for coronary angiography. I will prescribe metoprolol 25 mg twice daily. He should continue aspirin 81 mg and nitroglycerin as needed.  2. Hypertension: Controlled on lisinopril 40 mg. No changes.  3. Tobacco abuse  Dispo: fu 1 month.  Prentice Docker, M.D., F.A.C.C.

## 2016-06-12 NOTE — Interval H&P Note (Signed)
Cath Lab Visit (complete for each Cath Lab visit)  Clinical Evaluation Leading to the Procedure:   ACS: Yes.    Non-ACS:    Anginal Classification: CCS IV  Anti-ischemic medical therapy: Minimal Therapy (1 class of medications)  Non-Invasive Test Results: Intermediate-risk stress test findings: cardiac mortality 1-3%/year  Prior CABG: No previous CABG  Chest pain this morning at rest. Relieved with NTG.     History and Physical Interval Note:  06/12/2016 8:36 AM  [name]  has presented today for surgery, with the diagnosis of abn stress cp  The various methods of treatment have been discussed with the patient and family. After consideration of risks, benefits and other options for treatment, the patient has consented to  Procedure(s): Left Heart Cath and Coronary Angiography (N/A) as a surgical intervention .  The patient's history has been reviewed, patient examined, no change in status, stable for surgery.  I have reviewed the patient's chart and labs.  Questions were answered to the patient's satisfaction.     Lance Muss

## 2016-06-12 NOTE — Progress Notes (Signed)
Pt takes xanax 1 mg up to 4 x daily, pt requesting dose, Dr Eldridge Dace was paged/ orders followed.

## 2016-06-12 NOTE — Telephone Encounter (Signed)
-----   Message from Laqueta Linden, MD sent at 06/11/2016  1:31 PM EST ----- Ok.

## 2016-06-12 NOTE — Discharge Instructions (Signed)

## 2016-06-14 ENCOUNTER — Other Ambulatory Visit: Payer: Self-pay

## 2016-06-14 ENCOUNTER — Encounter: Payer: Self-pay | Admitting: Gastroenterology

## 2016-06-14 ENCOUNTER — Ambulatory Visit (INDEPENDENT_AMBULATORY_CARE_PROVIDER_SITE_OTHER): Payer: Medicaid Other | Admitting: Gastroenterology

## 2016-06-14 VITALS — BP 115/61 | HR 83 | Temp 97.8°F | Ht 69.0 in | Wt 310.4 lb

## 2016-06-14 DIAGNOSIS — R0789 Other chest pain: Secondary | ICD-10-CM

## 2016-06-14 DIAGNOSIS — K449 Diaphragmatic hernia without obstruction or gangrene: Secondary | ICD-10-CM | POA: Insufficient documentation

## 2016-06-14 DIAGNOSIS — E8801 Alpha-1-antitrypsin deficiency: Secondary | ICD-10-CM

## 2016-06-14 DIAGNOSIS — K439 Ventral hernia without obstruction or gangrene: Secondary | ICD-10-CM

## 2016-06-14 DIAGNOSIS — K219 Gastro-esophageal reflux disease without esophagitis: Secondary | ICD-10-CM

## 2016-06-14 DIAGNOSIS — Z148 Genetic carrier of other disease: Secondary | ICD-10-CM

## 2016-06-14 DIAGNOSIS — R1013 Epigastric pain: Secondary | ICD-10-CM

## 2016-06-14 NOTE — Telephone Encounter (Signed)
See ov note. Discussed with patient at time of ov.
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
## 2016-06-14 NOTE — Assessment & Plan Note (Signed)
Explained significance of carrier state with patient. He should consider having his children screened given genetic implications for themselves and potential offspring. He does have somewhat depressed alpha 1 antitrypsin level but typically with MZ allele this would be enough to protect lungs. I am concerned because of his ongoing smoking and increased risk of COPD.   We will refer to [facility] Pulmonology for further evaluation.

## 2016-06-14 NOTE — Assessment & Plan Note (Signed)
Patient wants referral to [facility] surgery for consideration of surgical repair of recurrent ventral hernias. He has also been interested in weight loss surgery but states that he has not been approved by Medicaid although I'm not sure he's been evaluated by weight loss surgeon in the past. He is somewhat difficult historian with regards to this matter. Await upper GI series, then we'll plan on surgical referral for weight loss surgery plus or minus ventral hernia repair.

## 2016-06-14 NOTE — Progress Notes (Signed)
Please refer patient to our pulmonology for shortness of breath, alpha-1 antitrypsin deficiency carrier.  Please send copy of alpha-1 antitrypsin level dated 05/09/2016 and alpha-1 antitrypsin mutation analysis dated 05/09/2016

## 2016-06-14 NOTE — Patient Instructions (Signed)
1. Upper GI series as scheduled.  2. We will work on referral to pulmonologist. 3. Referral to surgeon will come after your upper GI series.

## 2016-06-14 NOTE — Assessment & Plan Note (Signed)
Recent unremarkable cardiac catheterization as outlined. Typical reflux symptoms well controlled. Previous workup with question esophageal spasm several years ago. Did not respond to calcium channel blockers. Tried on imipramine as well. Patient is convinced that his symptoms are secondary to hiatal hernia given small size, very unlikely. We will reevaluate with upper GI series.

## 2016-06-17 ENCOUNTER — Other Ambulatory Visit: Payer: Self-pay

## 2016-06-17 DIAGNOSIS — R0602 Shortness of breath: Secondary | ICD-10-CM

## 2016-06-17 NOTE — Progress Notes (Signed)
cc'ed to pcp °

## 2016-06-17 NOTE — Progress Notes (Signed)
Referral has been made to Dr. Hawkins 

## 2016-06-19 ENCOUNTER — Telehealth: Payer: Self-pay | Admitting: Cardiovascular Disease

## 2016-06-19 NOTE — Telephone Encounter (Signed)
Patient would like to know why no follow up is needed  Also would like to know what he needs to do about medication

## 2016-06-21 NOTE — Telephone Encounter (Signed)
LM to call back 2/14 & 2/15 - pt says he didn't have time to call back during office hours  - Pt will f/u with Dr. Purvis Sheffield per LOV in 1 month from 06/07/16 - pt scheduled for March - says he will cal back when he get home about medications

## 2016-06-24 ENCOUNTER — Ambulatory Visit (HOSPITAL_COMMUNITY)
Admission: RE | Admit: 2016-06-24 | Discharge: 2016-06-24 | Disposition: A | Discharge status: Home / Self Care | Payer: Medicaid Other | Source: Ambulatory Visit | Attending: Gastroenterology | Admitting: Gastroenterology

## 2016-06-24 DIAGNOSIS — R1013 Epigastric pain: Secondary | ICD-10-CM | POA: Insufficient documentation

## 2016-06-24 DIAGNOSIS — K449 Diaphragmatic hernia without obstruction or gangrene: Secondary | ICD-10-CM | POA: Insufficient documentation

## 2016-06-24 DIAGNOSIS — R0789 Other chest pain: Secondary | ICD-10-CM | POA: Diagnosis present

## 2016-06-24 DIAGNOSIS — K219 Gastro-esophageal reflux disease without esophagitis: Secondary | ICD-10-CM | POA: Insufficient documentation

## 2016-06-25 NOTE — Progress Notes (Signed)
PT is aware. Ok to refer to [facility]. OK to send in Rx for Imipramine.

## 2016-06-25 NOTE — Progress Notes (Signed)
Please let patient know that he had a small amount of reflux noted during his UGI series. No obvious hiatal hernia, certainly nothing to surgically repair (patient previously requested hiatal hernia repair).   I would recommend referral to [facility] for consideration of weight loss surgery and ventral hernia repair.   For his atypical chest pain, I am not certain that it is related to GERD or esophageal spasms or non-GI source. Given the number of medications he is on, choices are limited but we could try imipramine again if he would like.

## 2016-06-26 ENCOUNTER — Other Ambulatory Visit: Payer: Self-pay

## 2016-06-26 ENCOUNTER — Other Ambulatory Visit: Payer: Self-pay | Admitting: Gastroenterology

## 2016-06-26 DIAGNOSIS — K449 Diaphragmatic hernia without obstruction or gangrene: Secondary | ICD-10-CM

## 2016-06-26 MED ORDER — DILTIAZEM HCL 30 MG PO TABS: 30.0000 mg | ORAL_TABLET | Freq: Four times a day (QID) | ORAL | 0 refills | Status: DC

## 2016-06-26 NOTE — Progress Notes (Signed)
rx for imipramine sent.

## 2016-06-26 NOTE — Progress Notes (Signed)
Please tell patient that imipramine interferes with tramadol. I have sent in rx for cardiazem to take four times daily for possible esophageal spasms. Call in four weeks with PR, if helps we can continue medication.

## 2016-06-27 NOTE — Progress Notes (Signed)
Pt is aware.  

## 2016-07-05 ENCOUNTER — Ambulatory Visit: Payer: Medicaid Other | Admitting: Cardiovascular Disease

## 2016-07-24 ENCOUNTER — Ambulatory Visit: Payer: Medicaid Other | Admitting: Orthopaedic Surgery

## 2016-07-26 ENCOUNTER — Ambulatory Visit: Payer: Medicaid Other | Attending: Neurology | Admitting: Neurology

## 2016-07-26 DIAGNOSIS — G4733 Obstructive sleep apnea (adult) (pediatric): Secondary | ICD-10-CM | POA: Diagnosis not present

## 2016-07-29 ENCOUNTER — Ambulatory Visit: Payer: Medicaid Other | Admitting: Cardiovascular Disease

## 2016-07-29 ENCOUNTER — Other Ambulatory Visit (HOSPITAL_COMMUNITY): Payer: Self-pay | Admitting: Respiratory Therapy

## 2016-07-29 DIAGNOSIS — J441 Chronic obstructive pulmonary disease with (acute) exacerbation: Secondary | ICD-10-CM

## 2016-07-30 ENCOUNTER — Ambulatory Visit: Payer: Medicaid Other | Admitting: Orthopaedic Surgery

## 2016-07-30 ENCOUNTER — Encounter: Payer: Self-pay | Admitting: Orthopaedic Surgery

## 2016-07-31 ENCOUNTER — Encounter: Payer: Self-pay | Admitting: Cardiovascular Disease

## 2016-07-31 ENCOUNTER — Ambulatory Visit (HOSPITAL_COMMUNITY)
Admission: RE | Admit: 2016-07-31 | Discharge: 2016-07-31 | Disposition: A | Discharge status: Home / Self Care | Payer: Medicaid Other | Source: Ambulatory Visit | Attending: Pulmonary Disease | Admitting: Pulmonary Disease

## 2016-07-31 DIAGNOSIS — R942 Abnormal results of pulmonary function studies: Secondary | ICD-10-CM | POA: Diagnosis not present

## 2016-07-31 DIAGNOSIS — J449 Chronic obstructive pulmonary disease, unspecified: Secondary | ICD-10-CM | POA: Insufficient documentation

## 2016-07-31 MED ORDER — ALBUTEROL SULFATE (2.5 MG/3ML) 0.083% IN NEBU
2.5000 mg | INHALATION_SOLUTION | Freq: Once | RESPIRATORY_TRACT | Status: AC
  Administered 2016-07-31: 2.5 mg via RESPIRATORY_TRACT

## 2016-08-01 LAB — PULMONARY FUNCTION TEST
DL/VA % PRED: 142 %
DL/VA: 6.55 ml/min/mmHg/L
DLCO COR: 14.25 ml/min/mmHg
DLCO cor % pred: 45 %
DLCO unc % pred: 45 %
DLCO unc: 14.25 ml/min/mmHg
FEF 25-75 Post: 1.65 L/sec
FEF 25-75 Pre: 1.14 L/sec
FEF2575-%CHANGE-POST: 44 %
FEF2575-%Pred-Post: 40 %
FEF2575-%Pred-Pre: 28 %
FEV1-%CHANGE-POST: 7 %
FEV1-%Pred-Post: 46 %
FEV1-%Pred-Pre: 42 %
FEV1-Post: 1.93 L
FEV1-Pre: 1.8 L
FEV1FVC-%CHANGE-POST: -16 %
FEV1FVC-%Pred-Pre: 68 %
FEV6-%CHANGE-POST: 29 %
FEV6-%Pred-Post: 82 %
FEV6-%Pred-Pre: 63 %
FEV6-PRE: 3.24 L
FEV6-Post: 4.19 L
FEV6FVC-%Pred-Post: 102 %
FEV6FVC-%Pred-Pre: 102 %
FVC-%Change-Post: 29 %
FVC-%PRED-POST: 80 %
FVC-%Pred-Pre: 62 %
FVC-PRE: 3.24 L
FVC-Post: 4.19 L
POST FEV1/FVC RATIO: 46 %
PRE FEV6/FVC RATIO: 100 %
Post FEV6/FVC ratio: 100 %
Pre FEV1/FVC ratio: 55 %
RV % pred: 144 %
RV: 2.45 L
TLC % pred: 79 %
TLC: 5.36 L

## 2016-08-06 NOTE — Procedures (Signed)
[facility] Ralene Gasparyan A. Gerilyn Pilgrim, MD     [email or web address]             NOCTURNAL POLYSOMNOGRAPHY   LOCATION: [facility]   Patient Name: [name], Towell Date: 07/26/2016 Gender: Male D.O.B: 09-09-1980 Age (years): 35 Referring Provider: Jorge Mandril NP Height (inches): 69 Interpreting Physician: Beryle Beams MD, ABSM Weight (lbs): 300 RPSGT: Peak, Robert BMI: 44 MRN: [id number] Neck Size: 18.50 CLINICAL INFORMATION The patient is referred for a CPAP titration to treat sleep apnea.  Date of NPSG, Split Night or HST:  SLEEP STUDY TECHNIQUE As per the AASM Manual for the Scoring of Sleep and Associated Events v2.3 (April 2016) with a hypopnea requiring 4% desaturations.  The channels recorded and monitored were frontal, central and occipital EEG, electrooculogram (EOG), submentalis EMG (chin), nasal and oral airflow, thoracic and abdominal wall motion, anterior tibialis EMG, snore microphone, electrocardiogram, and pulse oximetry. Continuous positive airway pressure (CPAP) was initiated at the beginning of the study and titrated to treat sleep-disordered breathing.  MEDICATIONS Medications self-administered by patient taken the night of the study : N/A  Current Outpatient Prescriptions:  .  albuterol (PROVENTIL HFA;VENTOLIN HFA) 108 (90 Base) MCG/ACT inhaler, Inhale 2 puffs into the lungs every 6 (six) hours as needed for wheezing or shortness of breath., Disp: , Rfl:  .  ALPRAZolam (XANAX) 1 MG tablet, Take 1 mg by mouth 4 (four) times daily. , Disp: , Rfl:  .  amLODipine (NORVASC) 5 MG tablet, Take 5 mg by mouth daily., Disp: , Rfl:  .  budesonide-formoterol (SYMBICORT) 160-4.5 MCG/ACT inhaler, Inhale 2 puffs into the lungs 2 (two) times daily., Disp: , Rfl:  .  Cariprazine HCl (VRAYLAR) 3 MG CAPS, Take 3 mg by mouth daily., Disp: , Rfl:  .  cyclobenzaprine (FLEXERIL) 10 MG tablet, Take 10 mg by mouth 2 (two) times daily as needed for muscle spasms., Disp: ,  Rfl:  .  diltiazem (CARDIZEM) 30 MG tablet, Take 1 tablet (30 mg total) by mouth 4 (four) times daily., Disp: 120 tablet, Rfl: 0 .  donepezil (ARICEPT) 5 MG tablet, Take 10 mg by mouth every morning. , Disp: , Rfl:  .  fluticasone (FLONASE) 50 MCG/ACT nasal spray, Place 2 sprays into both nostrils daily as needed for allergies. , Disp: , Rfl:  .  HYDROcodone-acetaminophen (NORCO) 7.5-325 MG tablet, One every six hours as needed for pain.  Seven day limit per Medicaid guidelines. (Patient taking differently: Take 1 tablet by mouth every 6 (six) hours as needed for moderate pain or severe pain. One every six hours as needed for pain.  Seven day limit per Medicaid guidelines.), Disp: 28 tablet, Rfl: 0 .  lisinopril (PRINIVIL,ZESTRIL) 40 MG tablet, Take 1 tablet (40 mg total) by mouth daily., Disp: 90 tablet, Rfl: 2 .  loratadine (CLARITIN) 10 MG tablet, Take 10 mg by mouth daily as needed for allergies., Disp: , Rfl:  .  metoprolol tartrate (LOPRESSOR) 25 MG tablet, Take 1 tablet (25 mg total) by mouth 2 (two) times daily., Disp: 60 tablet, Rfl: 3 .  nitroGLYCERIN (NITROSTAT) 0.4 MG SL tablet, Place 1 tablet (0.4 mg total) under the tongue every 5 (five) minutes as needed for chest pain., Disp: 25 tablet, Rfl: 3 .  omeprazole (PRILOSEC) 40 MG capsule, Take 40 mg by mouth 2 (two) times daily., Disp: , Rfl:  .  rizatriptan (MAXALT) 10 MG tablet, Take 10 mg by mouth as needed for migraine. May repeat in 2 hours if needed, Disp: ,  Rfl:  .  traMADol (ULTRAM) 50 MG tablet, Take 50 mg by mouth every 6 (six) hours as needed for moderate pain. , Disp: , Rfl:  .  vortioxetine HBr (TRINTELLIX) 10 MG TABS, Take 10 mg by mouth daily., Disp: , Rfl:    TECHNICIAN COMMENTS Comments added by technician: PATIENT COMES TO SLEEP CENTER FOR CPAP TITRATION. ALL STAGES OF SLEEP WERE OBSERVED. SUPINE AND LATERAL POSITIONS WERE OBSERVED. AIRWAY OBSTRUCTIONS AND DESATURATIONS WERE ELIMINATED. SNORING WAS ALSO ELIMINATED.  PERIODIC LEG MOVEMENTS WERE NOTED. ECG SHOWED NORMAL SINUS RHYTHM.  Comments added by scorer: N/A RESPIRATORY PARAMETERS Optimal PAP Pressure (cm): 8 AHI at Optimal Pressure (/hr): N/A Overall Minimal O2 (%): 85.00 Supine % at Optimal Pressure (%): N/A Minimal O2 at Optimal Pressure (%): 85.00   SLEEP ARCHITECTURE The study was initiated at 11:06:04 PM and ended at 4:56:13 AM.  Sleep onset time was 74.9 minutes and the sleep efficiency was 76.7%. The total sleep time was 268.5 minutes.  The patient spent 2.98% of the night in stage N1 sleep, 70.39% in stage N2 sleep, 12.85% in stage N3 and 13.78% in REM.Stage REM latency was 44.5 minutes  Wake after sleep onset was 6.8. Alpha intrusion was absent. Supine sleep was 12.19%.  CARDIAC DATA The 2 lead EKG demonstrated sinus rhythm. The mean heart rate was 63.30 beats per minute. Other EKG findings include: None. LEG MOVEMENT DATA The total Periodic Limb Movements of Sleep (PLMS) were 0. The PLMS index was 0.00. A PLMS index of <15 is considered normal in adults.  IMPRESSIONS - The optimal CPAP was 8 cm of water. - Clinically significant periodic limb movements were not noted during this study. Arousals associated with PLMs were rare.  Argie Ramming, MD Diplomate, American Board of Sleep Medicine.  ELECTRONICALLY SIGNED ON:  08/06/2016, 10:57 AM [facility] PH: [phone]   FX: [phone] ACCREDITED BY THE AMERICAN ACADEMY OF SLEEP MEDICINE

## 2016-08-19 ENCOUNTER — Ambulatory Visit (INDEPENDENT_AMBULATORY_CARE_PROVIDER_SITE_OTHER): Payer: Medicaid Other | Admitting: Otolaryngology

## 2016-09-09 ENCOUNTER — Ambulatory Visit (INDEPENDENT_AMBULATORY_CARE_PROVIDER_SITE_OTHER): Payer: Medicaid Other | Admitting: Otolaryngology

## 2016-09-26 NOTE — Progress Notes (Signed)
REVIEWED-NO ADDITIONAL RECOMMENDATIONS. 

## 2016-10-13 ENCOUNTER — Emergency Department (HOSPITAL_COMMUNITY)
Admission: EM | Admit: 2016-10-13 | Discharge: 2016-10-13 | Disposition: A | Discharge status: Home / Self Care | Payer: Medicaid Other | Attending: Emergency Medicine | Admitting: Emergency Medicine

## 2016-10-13 ENCOUNTER — Encounter (HOSPITAL_COMMUNITY): Payer: Self-pay | Admitting: *Deleted

## 2016-10-13 ENCOUNTER — Emergency Department (HOSPITAL_COMMUNITY): Payer: Medicaid Other

## 2016-10-13 DIAGNOSIS — I10 Essential (primary) hypertension: Secondary | ICD-10-CM | POA: Diagnosis not present

## 2016-10-13 DIAGNOSIS — F17210 Nicotine dependence, cigarettes, uncomplicated: Secondary | ICD-10-CM | POA: Diagnosis not present

## 2016-10-13 DIAGNOSIS — Z79899 Other long term (current) drug therapy: Secondary | ICD-10-CM | POA: Diagnosis not present

## 2016-10-13 DIAGNOSIS — J449 Chronic obstructive pulmonary disease, unspecified: Secondary | ICD-10-CM | POA: Insufficient documentation

## 2016-10-13 DIAGNOSIS — R1012 Left upper quadrant pain: Secondary | ICD-10-CM | POA: Diagnosis not present

## 2016-10-13 DIAGNOSIS — Y999 Unspecified external cause status: Secondary | ICD-10-CM | POA: Insufficient documentation

## 2016-10-13 DIAGNOSIS — Y92410 Unspecified street and highway as the place of occurrence of the external cause: Secondary | ICD-10-CM | POA: Insufficient documentation

## 2016-10-13 DIAGNOSIS — Y939 Activity, unspecified: Secondary | ICD-10-CM | POA: Diagnosis not present

## 2016-10-13 DIAGNOSIS — S3991XA Unspecified injury of abdomen, initial encounter: Secondary | ICD-10-CM | POA: Diagnosis present

## 2016-10-13 LAB — CBC WITH DIFFERENTIAL/PLATELET
BASOS ABS: 0 10*3/uL (ref 0.0–0.1)
Basophils Relative: 1 %
EOS ABS: 0.2 10*3/uL (ref 0.0–0.7)
EOS PCT: 3 %
HCT: 43.5 % (ref 39.0–52.0)
Hemoglobin: 14.9 g/dL (ref 13.0–17.0)
LYMPHS PCT: 30 %
Lymphs Abs: 2 10*3/uL (ref 0.7–4.0)
MCH: 31.5 pg (ref 26.0–34.0)
MCHC: 34.3 g/dL (ref 30.0–36.0)
MCV: 92 fL (ref 78.0–100.0)
MONO ABS: 0.5 10*3/uL (ref 0.1–1.0)
Monocytes Relative: 8 %
Neutro Abs: 3.9 10*3/uL (ref 1.7–7.7)
Neutrophils Relative %: 58 %
PLATELETS: 186 10*3/uL (ref 150–400)
RBC: 4.73 MIL/uL (ref 4.22–5.81)
RDW: 12.3 % (ref 11.5–15.5)
WBC: 6.7 10*3/uL (ref 4.0–10.5)

## 2016-10-13 LAB — COMPREHENSIVE METABOLIC PANEL
ALT: 20 U/L (ref 17–63)
AST: 17 U/L (ref 15–41)
Albumin: 4.1 g/dL (ref 3.5–5.0)
Alkaline Phosphatase: 75 U/L (ref 38–126)
Anion gap: 9 (ref 5–15)
BUN: 11 mg/dL (ref 6–20)
CHLORIDE: 107 mmol/L (ref 101–111)
CO2: 26 mmol/L (ref 22–32)
Calcium: 9.4 mg/dL (ref 8.9–10.3)
Creatinine, Ser: 0.9 mg/dL (ref 0.61–1.24)
Glucose, Bld: 100 mg/dL — ABNORMAL HIGH (ref 65–99)
POTASSIUM: 4.3 mmol/L (ref 3.5–5.1)
Sodium: 142 mmol/L (ref 135–145)
Total Bilirubin: 0.9 mg/dL (ref 0.3–1.2)
Total Protein: 7.2 g/dL (ref 6.5–8.1)

## 2016-10-13 LAB — LIPASE, BLOOD: LIPASE: 19 U/L (ref 11–51)

## 2016-10-13 MED ORDER — CYCLOBENZAPRINE HCL 10 MG PO TABS: 10.0000 mg | ORAL_TABLET | Freq: Two times a day (BID) | ORAL | 0 refills | Status: DC | PRN

## 2016-10-13 MED ORDER — FENTANYL CITRATE (PF) 100 MCG/2ML IJ SOLN
50.0000 ug | Freq: Once | INTRAMUSCULAR | Status: AC
  Administered 2016-10-13: 50 ug via INTRAVENOUS
  Filled 2016-10-13: qty 2

## 2016-10-13 MED ORDER — IOPAMIDOL (ISOVUE-300) INJECTION 61%
100.0000 mL | Freq: Once | INTRAVENOUS | Status: AC | PRN
  Administered 2016-10-13: 100 mL via INTRAVENOUS

## 2016-10-13 MED ORDER — SODIUM CHLORIDE 0.9 % IV BOLUS (SEPSIS)
1000.0000 mL | Freq: Once | INTRAVENOUS | Status: AC
  Administered 2016-10-13: 1000 mL via INTRAVENOUS
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
## 2016-10-28 ENCOUNTER — Ambulatory Visit (HOSPITAL_COMMUNITY): Payer: Medicaid Other | Attending: Internal Medicine | Admitting: Physical Therapy

## 2016-10-28 DIAGNOSIS — R262 Difficulty in walking, not elsewhere classified: Secondary | ICD-10-CM | POA: Diagnosis present

## 2016-10-28 DIAGNOSIS — M545 Low back pain, unspecified: Secondary | ICD-10-CM

## 2016-10-28 DIAGNOSIS — M6281 Muscle weakness (generalized): Secondary | ICD-10-CM | POA: Diagnosis present
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
## 2016-10-28 NOTE — Patient Instructions (Addendum)
Backward Bend (Standing)    Arch backward to make hollow of back deeper. Hold ____ seconds. Repeat ____ times per set. Do ____ sets per session. Do ____ sessions per day.  http://orth.exer.us/178   Copyright  VHI. All rights reserved.  Thoracolumbar Side-Bend: Single Arm (Standing)    Reach over head to other side with right arm until stretch is felt. Hold ____ seconds. Relax. Repeat ____ times per set. Do ____ sets per session. Do ____ sessions per day.  http://orth.exer.us/262   Copyright  VHI. All rights reserved.  Knee-to-Chest Stretch: Unilateral    With hand behind right knee, pull knee in to chest until a comfortable stretch is felt in lower back and buttocks. Keep back relaxed. Hold _10___ seconds. Repeat ___5_ times per set. Do ___1_ sets per session. Do __2__ sessions per day.  http://orth.exer.us/126   Copyright  VHI. All rights reserved.  Lower Trunk Rotation Stretch    Keeping back flat and feet together, rotate knees to left side. Hold __3__ seconds. Repeat 10____ times per set. Do ____1 sets per session. Do __2__ sessions per day.  http://orth.exer.us/122   Copyright  VHI. All rights reserved.  Bridging    Slowly raise buttocks from floor, keeping stomach tight. Repeat 10____ times per set. Do __1__ sets per session. Do __2__ sessions per day.  http://orth.exer.us/1096   Copyright  VHI. All rights reserved.

## 2016-11-05 ENCOUNTER — Ambulatory Visit (HOSPITAL_COMMUNITY): Payer: No Typology Code available for payment source | Admitting: Physical Therapy

## 2016-11-05 ENCOUNTER — Telehealth (HOSPITAL_COMMUNITY): Payer: Self-pay | Admitting: Physical Therapy

## 2016-11-05 ENCOUNTER — Encounter: Payer: Self-pay | Admitting: Orthopaedic Surgery

## 2016-11-05 NOTE — Telephone Encounter (Signed)
Pt requested to be put on hold until he sees Dr. Lovell Sheehan and then they will decide if he needs to continue. Talked to [name] at [facility] office and she advised pt to continue until he is seem by Dr. Hilda Lias

## 2016-11-08 ENCOUNTER — Ambulatory Visit (HOSPITAL_COMMUNITY): Payer: No Typology Code available for payment source

## 2016-11-12 ENCOUNTER — Ambulatory Visit (HOSPITAL_COMMUNITY): Payer: No Typology Code available for payment source | Admitting: Physical Therapy

## 2016-11-14 ENCOUNTER — Encounter (HOSPITAL_COMMUNITY): Payer: Medicaid Other | Admitting: Physical Therapy

## 2016-11-17 ENCOUNTER — Encounter (HOSPITAL_COMMUNITY): Payer: Self-pay | Admitting: Emergency Medicine

## 2016-11-17 ENCOUNTER — Emergency Department (HOSPITAL_COMMUNITY)
Admission: EM | Admit: 2016-11-17 | Discharge: 2016-11-17 | Disposition: A | Discharge status: Home / Self Care | Payer: Medicaid Other | Attending: Emergency Medicine | Admitting: Emergency Medicine

## 2016-11-17 DIAGNOSIS — F17210 Nicotine dependence, cigarettes, uncomplicated: Secondary | ICD-10-CM | POA: Insufficient documentation

## 2016-11-17 DIAGNOSIS — Z79899 Other long term (current) drug therapy: Secondary | ICD-10-CM | POA: Diagnosis not present

## 2016-11-17 DIAGNOSIS — Y999 Unspecified external cause status: Secondary | ICD-10-CM | POA: Diagnosis not present

## 2016-11-17 DIAGNOSIS — S39012A Strain of muscle, fascia and tendon of lower back, initial encounter: Secondary | ICD-10-CM | POA: Diagnosis not present

## 2016-11-17 DIAGNOSIS — Y92410 Unspecified street and highway as the place of occurrence of the external cause: Secondary | ICD-10-CM | POA: Insufficient documentation

## 2016-11-17 DIAGNOSIS — I10 Essential (primary) hypertension: Secondary | ICD-10-CM | POA: Diagnosis not present

## 2016-11-17 DIAGNOSIS — Y939 Activity, unspecified: Secondary | ICD-10-CM | POA: Insufficient documentation

## 2016-11-17 DIAGNOSIS — M47816 Spondylosis without myelopathy or radiculopathy, lumbar region: Secondary | ICD-10-CM | POA: Insufficient documentation

## 2016-11-17 DIAGNOSIS — J449 Chronic obstructive pulmonary disease, unspecified: Secondary | ICD-10-CM | POA: Diagnosis not present

## 2016-11-17 DIAGNOSIS — S3992XA Unspecified injury of lower back, initial encounter: Secondary | ICD-10-CM | POA: Diagnosis present

## 2016-11-17 MED ORDER — DEXTROSE 5 % IV SOLN
INTRAVENOUS | Status: AC
  Filled 2016-11-17: qty 10

## 2016-11-17 MED ORDER — DEXAMETHASONE 4 MG PO TABS: 4.0000 mg | ORAL_TABLET | Freq: Two times a day (BID) | ORAL | 0 refills | Status: DC

## 2016-11-17 MED ORDER — IBUPROFEN 600 MG PO TABS: 600.0000 mg | ORAL_TABLET | Freq: Four times a day (QID) | ORAL | 0 refills | Status: DC

## 2016-11-17 MED ORDER — CYCLOBENZAPRINE HCL 10 MG PO TABS: 10.0000 mg | ORAL_TABLET | Freq: Three times a day (TID) | ORAL | 0 refills | Status: DC
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
## 2016-11-17 NOTE — Discharge Instructions (Signed)
Review of your previous records reveals degenerative facet changes and evidence of muscle strain of your lower back. Your examination today suggests continued muscle strain problems.  please use a heating pad to your back at rest and when sitting. Please rest your back is much as possible. Use ibuprofen with breakfast, lunch, dinner, and at bedtime. Use Decadron 2 times daily with food. Use Flexeril 3 times daily for spasm pain.This medication may cause drowsiness. Please do not drink, drive, or participate in activity that requires concentration while taking this medication.

## 2016-11-17 NOTE — ED Notes (Signed)
Pt reports here for back pain 6/10 - followed with his PCP and was put into physicial therapy- "pulled out of physical therapy" and referred to Dr Jenetta Downer office- states he was unable "to get in"   Now out of flexeril and his back is no better
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
## 2016-11-19 ENCOUNTER — Encounter (HOSPITAL_COMMUNITY): Payer: Medicaid Other | Admitting: Physical Therapy

## 2016-11-21 ENCOUNTER — Encounter: Payer: Self-pay | Admitting: Orthopaedic Surgery

## 2016-11-21 ENCOUNTER — Telehealth (HOSPITAL_COMMUNITY): Payer: Self-pay | Admitting: Physical Therapy

## 2016-11-21 ENCOUNTER — Ambulatory Visit (HOSPITAL_COMMUNITY): Payer: No Typology Code available for payment source | Admitting: Physical Therapy

## 2016-11-21 ENCOUNTER — Encounter: Payer: Self-pay | Admitting: General Surgery

## 2016-11-21 ENCOUNTER — Ambulatory Visit (INDEPENDENT_AMBULATORY_CARE_PROVIDER_SITE_OTHER): Payer: Medicaid Other | Admitting: General Surgery

## 2016-11-21 ENCOUNTER — Ambulatory Visit (INDEPENDENT_AMBULATORY_CARE_PROVIDER_SITE_OTHER): Payer: Medicaid Other | Admitting: Orthopaedic Surgery

## 2016-11-21 ENCOUNTER — Ambulatory Visit (INDEPENDENT_AMBULATORY_CARE_PROVIDER_SITE_OTHER): Payer: Medicaid Other

## 2016-11-21 VITALS — BP 160/96 | HR 73 | Temp 97.8°F | Resp 18 | Ht 69.0 in | Wt 301.0 lb

## 2016-11-21 VITALS — BP 128/83 | HR 78 | Temp 98.5°F | Ht 69.0 in | Wt 299.0 lb

## 2016-11-21 DIAGNOSIS — M5441 Lumbago with sciatica, right side: Secondary | ICD-10-CM | POA: Diagnosis not present

## 2016-11-21 DIAGNOSIS — M6208 Separation of muscle (nontraumatic), other site: Secondary | ICD-10-CM

## 2016-11-21 DIAGNOSIS — G8929 Other chronic pain: Secondary | ICD-10-CM

## 2016-11-21 DIAGNOSIS — F17200 Nicotine dependence, unspecified, uncomplicated: Secondary | ICD-10-CM | POA: Diagnosis not present

## 2016-11-21 NOTE — Patient Instructions (Signed)
Steps to Quit Smoking Smoking tobacco can be bad for your health. It can also affect almost every organ in your body. Smoking puts you and people around you at risk for many serious long-lasting (chronic) diseases. Quitting smoking is hard, but it is one of the best things that you can do for your health. It is never too late to quit. What are the benefits of quitting smoking? When you quit smoking, you lower your risk for getting serious diseases and conditions. They can include:  Lung cancer or lung disease.  Heart disease.  Stroke.  Heart attack.  Not being able to have children (infertility).  Weak bones (osteoporosis) and broken bones (fractures).  If you have coughing, wheezing, and shortness of breath, those symptoms may get better when you quit. You may also get sick less often. If you are pregnant, quitting smoking can help to lower your chances of having a baby of low birth weight. What can I do to help me quit smoking? Talk with your doctor about what can help you quit smoking. Some things you can do (strategies) include:  Quitting smoking totally, instead of slowly cutting back how much you smoke over a period of time.  Going to in-person counseling. You are more likely to quit if you go to many counseling sessions.  Using resources and support systems, such as: ? Online chats with a counselor. ? Phone quitlines. ? Printed self-help materials. ? Support groups or group counseling. ? Text messaging programs. ? Mobile phone apps or applications.  Taking medicines. Some of these medicines may have nicotine in them. If you are pregnant or breastfeeding, do not take any medicines to quit smoking unless your doctor says it is okay. Talk with your doctor about counseling or other things that can help you.  Talk with your doctor about using more than one strategy at the same time, such as taking medicines while you are also going to in-person counseling. This can help make  quitting easier. What things can I do to make it easier to quit? Quitting smoking might feel very hard at first, but there is a lot that you can do to make it easier. Take these steps:  Talk to your family and friends. Ask them to support and encourage you.  Call phone quitlines, reach out to support groups, or work with a counselor.  Ask people who smoke to not smoke around you.  Avoid places that make you want (trigger) to smoke, such as: ? Bars. ? Parties. ? Smoke-break areas at work.  Spend time with people who do not smoke.  Lower the stress in your life. Stress can make you want to smoke. Try these things to help your stress: ? Getting regular exercise. ? Deep-breathing exercises. ? Yoga. ? Meditating. ? Doing a body scan. To do this, close your eyes, focus on one area of your body at a time from head to toe, and notice which parts of your body are tense. Try to relax the muscles in those areas.  Download or buy apps on your mobile phone or tablet that can help you stick to your quit plan. There are many free apps, such as QuitGuide from the CDC (Centers for Disease Control and Prevention). You can find more support from smokefree.gov and other websites.  This information is not intended to replace advice given to you by your health care provider. Make sure you discuss any questions you have with your health care provider. Document Released: 02/16/2009 Document   Revised: 12/19/2015 Document Reviewed: 09/06/2014 Elsevier Interactive Patient Education  2018 [employer]  
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
## 2016-11-21 NOTE — Telephone Encounter (Signed)
Patient just left Dr Jenetta Downer office and was told to hold off on rehab for now

## 2016-11-26 ENCOUNTER — Telehealth (HOSPITAL_COMMUNITY): Payer: Self-pay

## 2016-11-26 ENCOUNTER — Ambulatory Visit (HOSPITAL_COMMUNITY): Payer: No Typology Code available for payment source | Admitting: Physical Therapy

## 2016-11-26 NOTE — Telephone Encounter (Signed)
Called and spoke to pt about hold on therapy, pt reports current hold with insurance and MRI results.  Pt agreed to cancel current apts scheduled and call/reschedule when cleared to return.    [address] LPTA; CBIS [phone]

## 2016-11-28 ENCOUNTER — Ambulatory Visit (HOSPITAL_COMMUNITY): Payer: No Typology Code available for payment source

## 2016-11-29 ENCOUNTER — Ambulatory Visit (HOSPITAL_COMMUNITY): Payer: Medicaid Other

## 2016-12-03 ENCOUNTER — Ambulatory Visit (HOSPITAL_COMMUNITY): Payer: No Typology Code available for payment source | Admitting: Physical Therapy

## 2016-12-05 ENCOUNTER — Ambulatory Visit: Payer: Medicaid Other | Admitting: Orthopaedic Surgery

## 2016-12-09 ENCOUNTER — Ambulatory Visit (HOSPITAL_COMMUNITY)
Admission: RE | Admit: 2016-12-09 | Payer: No Typology Code available for payment source | Source: Ambulatory Visit | Admitting: Orthopaedic Surgery

## 2016-12-11 ENCOUNTER — Ambulatory Visit: Payer: Medicaid Other | Admitting: Orthopaedic Surgery

## 2016-12-12 ENCOUNTER — Ambulatory Visit (HOSPITAL_COMMUNITY): Payer: No Typology Code available for payment source

## 2016-12-12 ENCOUNTER — Ambulatory Visit (INDEPENDENT_AMBULATORY_CARE_PROVIDER_SITE_OTHER): Payer: Medicaid Other | Admitting: Otolaryngology

## 2016-12-17 ENCOUNTER — Emergency Department (HOSPITAL_COMMUNITY)
Admission: EM | Admit: 2016-12-17 | Discharge: 2016-12-18 | Disposition: A | Discharge status: Home / Self Care | Payer: Medicaid Other | Attending: Emergency Medicine | Admitting: Emergency Medicine

## 2016-12-17 ENCOUNTER — Encounter (HOSPITAL_COMMUNITY): Payer: Self-pay | Admitting: Emergency Medicine

## 2016-12-17 DIAGNOSIS — J449 Chronic obstructive pulmonary disease, unspecified: Secondary | ICD-10-CM | POA: Insufficient documentation

## 2016-12-17 DIAGNOSIS — F17210 Nicotine dependence, cigarettes, uncomplicated: Secondary | ICD-10-CM | POA: Diagnosis not present

## 2016-12-17 DIAGNOSIS — I10 Essential (primary) hypertension: Secondary | ICD-10-CM | POA: Diagnosis not present

## 2016-12-17 DIAGNOSIS — M5441 Lumbago with sciatica, right side: Secondary | ICD-10-CM | POA: Diagnosis not present

## 2016-12-17 DIAGNOSIS — Z79899 Other long term (current) drug therapy: Secondary | ICD-10-CM | POA: Insufficient documentation

## 2016-12-17 DIAGNOSIS — M545 Low back pain: Secondary | ICD-10-CM | POA: Diagnosis present

## 2016-12-17 MED ORDER — TRAMADOL HCL 50 MG PO TABS
100.0000 mg | ORAL_TABLET | Freq: Once | ORAL | Status: AC
  Administered 2016-12-18: 100 mg via ORAL
  Filled 2016-12-17: qty 2

## 2016-12-17 MED ORDER — DEXAMETHASONE SODIUM PHOSPHATE 10 MG/ML IJ SOLN
10.0000 mg | Freq: Once | INTRAMUSCULAR | Status: AC
  Administered 2016-12-18: 10 mg via INTRAMUSCULAR
  Filled 2016-12-17: qty 1

## 2016-12-17 MED ORDER — CYCLOBENZAPRINE HCL 10 MG PO TABS
10.0000 mg | ORAL_TABLET | Freq: Once | ORAL | Status: AC
  Administered 2016-12-18: 10 mg via ORAL
  Filled 2016-12-17: qty 1

## 2016-12-17 MED ORDER — PROMETHAZINE HCL 12.5 MG PO TABS
12.5000 mg | ORAL_TABLET | Freq: Once | ORAL | Status: AC
  Administered 2016-12-18: 12.5 mg via ORAL
  Filled 2016-12-17: qty 1

## 2016-12-17 NOTE — ED Provider Notes (Signed)
[facility] Provider Note   CSN: [id number] Arrival date & time: 12/17/16  2129     History   Chief Complaint Chief Complaint  Patient presents with  . Back Pain    HPI [name] is a 36 y.o. male.  Patient is a 36 year old male who presents to the emergency department with a complaint of back pain.  Patient has a history of chronic abdominal pain, depression, and chronic back pain. Patient is seen by orthopedics. The patient is waiting to get approval through his insurance to have an MRI of the back. The patient states that he is currently having pain that is getting worse, and he states that his doctors are not really addressing his pain, and waiting on the MRI. He presents to the emergency department for assistance. He states he is not losing control of bowel or bladder. He is not having frequent falls. He has tried conservative measures including heat, Tylenol, and ibuprofen. He states that none of these are helping.      Past Medical History:  Diagnosis Date  . Alpha-1-antitrypsin deficiency carrier [facility])    labs 05/2016 with documented MZ allele  . Anxiety   . Bipolar 2 disorder (HCC)   . Chronic abdominal pain    HIDA scan Sept 2010, EF 93%, s/p chole oct 2010; evaluated at [facility] for abdominal wall pain; Imipramine added May 2011  . Chronic vomiting    normal GES 2009  . Depression    increased over past several months  . GERD (gastroesophageal reflux disease)    Bravo study Nov 2009 on Prilosec 40 BID with adequate acid suppression  . Hiatal hernia   . Hypertension   . S/P endoscopy    2008 Dr. Karilyn Cota: erosive reflux esophagitis, antral gastritis, H.pylori serologies neg, Nov 2009 Dr. Darrick Penna: gastritis, benign esophageal polyp, no H.pylori,  Feb 2011: [facility], Dr. Bubba Hales: normal esophagus, normal stomach, normal duodenum, path unremarkable  . Shortness of breath    SOB even with no exertion  . Skull fracture (HCC)   . Sleep apnea    supposed to  sleep with CPAP  . Ventral hernia     Patient Active Problem List   Diagnosis Date Noted  . Hiatal hernia 06/14/2016  . Alpha-1-antitrypsin deficiency carrier (HCC) 06/14/2016  . Atypical chest pain 06/14/2016  . Abnormal nuclear stress test   . Intracranial hemorrhage (HCC) 01/20/2016  . Nausea without vomiting 12/15/2015  . Early satiety 07/25/2015  . Gastroesophageal reflux disease with esophagitis 06/27/2015  . Nausea vomiting and diarrhea 04/27/2015  . Transaminitis 04/27/2015  . Cough 06/23/2013  . Tobacco abuse counseling 06/23/2013  . Abdominal cramping 05/05/2013  . COPD (chronic obstructive pulmonary disease) with acute bronchitis (HCC) 05/05/2013  . Syncope 05/05/2013  . Depression with anxiety 05/05/2013  . Hyperglycemia 05/05/2013  . Black-out (not amnesia) 05/05/2013  . Smoking 04/16/2013  . Blackout spell 04/16/2013  . Hx of bipolar disorder 04/16/2013  . GERD (gastroesophageal reflux disease) 04/16/2013  . OSA (obstructive sleep apnea) 04/16/2013  . Dyspnea 04/16/2013  . Bipolar 2 disorder (HCC) 03/08/2013  . HTN (hypertension) 03/08/2013  . Tobacco abuse 03/08/2013  . Referred for management of medication therapy 03/08/2013  . Asthma exacerbation, non-allergic 03/08/2013  . Sore throat 03/08/2013  . Unspecified sleep apnea 03/08/2013  . Pain in joint, ankle and foot 11/25/2012  . Hyperbilirubinemia 01/10/2012  . Ventral hernia without obstruction or gangrene 11/12/2011  . Obesity 11/12/2011  . Abdominal wall pain 03/06/2011  .  NAUSEA WITH VOMITING 05/15/2009  . WEIGHT GAIN, ABNORMAL 03/21/2009  . EPIGASTRIC PAIN, CHRONIC 03/21/2009  . 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 SMOKER 03/20/2009  . DEPRESSION 03/20/2009  . HYPERTENSION 03/20/2009  . GERD 03/20/2009  . HEMATEMESIS 03/20/2009  . SHOULDER PAIN, RIGHT 03/20/2009  . ANOREXIA 03/20/2009  . ABDOMINAL PAIN, GENERALIZED 03/20/2009  . CANNABIS ABUSE, HX OF 03/20/2009    Past Surgical History:  Procedure  Laterality Date  . APPENDECTOMY  5/10  . BIOPSY  08/15/2015   Procedure: BIOPSY;  Surgeon: West Bali, MD;  Location: [facility];  Service: Endoscopy;;  gastric bx  . CHOLECYSTECTOMY  10/10  . ESOPHAGOGASTRODUODENOSCOPY  05/2006   H.pylori neg  . ESOPHAGOGASTRODUODENOSCOPY  03/23/2008   Fields-gastritis benign esophageal polyp, otherwise normal. Negative H. pylori he (propofol)  . ESOPHAGOGASTRODUODENOSCOPY (EGD) WITH PROPOFOL N/A 08/15/2015   Procedure: ESOPHAGOGASTRODUODENOSCOPY (EGD) WITH PROPOFOL;  Surgeon: West Bali, MD;  Location: [facility];  Service: Endoscopy;  Laterality: N/A;  [id number]  . INCISIONAL HERNIA REPAIR  03/04/2012   Procedure: LAPAROSCOPIC INCISIONAL HERNIA;  Surgeon: Dalia Heading, MD;  Location: [facility] ORS;  Service: General;  Laterality: N/A;  repair, recurrent  . INCISIONAL HERNIA REPAIR N/A 01/12/2014   Procedure: HERNIA REPAIR INCISIONAL WITH MESH;  Surgeon: Dalia Heading, MD;  Location: [facility] ORS;  Service: General;  Laterality: N/A;  . INGUINAL HERNIA REPAIR     child  . INSERTION OF MESH  03/04/2012   Procedure: INSERTION OF MESH;  Surgeon: Dalia Heading, MD;  Location: [facility] ORS;  Service: General;  Laterality: N/A;  . INSERTION OF MESH N/A 01/12/2014   Procedure: INSERTION OF MESH;  Surgeon: Dalia Heading, MD;  Location: [facility] ORS;  Service: General;  Laterality: N/A;  . KNEE SURGERY Right 2009  . LEFT HEART CATH AND CORONARY ANGIOGRAPHY N/A 06/12/2016   Procedure: Left Heart Cath and Coronary Angiography;  Surgeon: Corky Crafts, MD;  Location: [facility] INVASIVE CV LAB;  Service: Cardiovascular;  Laterality: N/A;  . VENTRAL HERNIA REPAIR  March 2012   Dr. Lovell Sheehan       Home Medications    Prior to Admission medications   Medication Sig Start Date End Date Taking? Authorizing Provider  albuterol (PROVENTIL HFA;VENTOLIN HFA) 108 (90 Base) MCG/ACT inhaler Inhale 2 puffs into the lungs every 6 (six) hours as needed for wheezing or shortness of breath.     [provider]  ALPRAZolam Prudy Feeler) 1 MG tablet Take 1 mg by mouth 4 (four) times daily.     [provider]  amLODipine (NORVASC) 5 MG tablet Take 5 mg by mouth daily.    [provider]  budesonide-formoterol (SYMBICORT) 160-4.5 MCG/ACT inhaler Inhale 2 puffs into the lungs 2 (two) times daily.    [provider]  Cariprazine HCl (VRAYLAR) 3 MG CAPS Take 3 mg by mouth daily.    [provider]  cyclobenzaprine (FLEXERIL) 10 MG tablet Take 1 tablet (10 mg total) by mouth 3 (three) times daily. 11/17/16   Ivery Quale, PA-C  dexamethasone (DECADRON) 4 MG tablet Take 1 tablet (4 mg total) by mouth 2 (two) times daily with a meal. 11/17/16   Ivery Quale, PA-C  diltiazem (CARDIZEM) 30 MG tablet Take 1 tablet (30 mg total) by mouth 4 (four) times daily. 06/26/16   Tiffany Kocher, PA-C  fluticasone (FLONASE) 50 MCG/ACT nasal spray Place 2 sprays into both nostrils daily as needed for allergies.     [provider]  HYDROcodone-acetaminophen (NORCO) 7.5-325 MG tablet One every six hours as needed for pain.  Seven day limit per Medicaid guidelines. Patient taking differently: Take 1 tablet by mouth every 6 (six) hours as needed for moderate pain or severe pain. One every six hours as needed for pain.  Seven day limit per Medicaid guidelines. 05/08/16   Darreld Mclean, MD  ibuprofen (ADVIL,MOTRIN) 600 MG tablet Take 1 tablet (600 mg total) by mouth 4 (four) times daily. 11/17/16   Ivery Quale, PA-C  lisinopril (PRINIVIL,ZESTRIL) 40 MG tablet Take 1 tablet (40 mg total) by mouth daily. 05/31/16   Laqueta Linden, MD  loratadine (CLARITIN) 10 MG tablet Take 10 mg by mouth daily as needed for allergies.    [provider]  metoprolol tartrate (LOPRESSOR) 25 MG tablet Take 1 tablet (25 mg total) by mouth 2 (two) times daily. 06/07/16 10/13/16  Laqueta Linden, MD  nitroGLYCERIN (NITROSTAT) 0.4 MG SL tablet Place 1 tablet (0.4 mg total) under  the tongue every 5 (five) minutes as needed for chest pain. 05/30/16 10/13/16  Laqueta Linden, MD  omeprazole (PRILOSEC) 40 MG capsule Take 40 mg by mouth 2 (two) times daily.    [provider]  rizatriptan (MAXALT) 10 MG tablet Take 10 mg by mouth as needed for migraine. May repeat in 2 hours if needed    [provider]  traMADol (ULTRAM) 50 MG tablet Take 50 mg by mouth every 6 (six) hours as needed for moderate pain.     [provider]  vortioxetine HBr (TRINTELLIX) 10 MG TABS Take 10 mg by mouth daily.    [provider]    Family History Family History  Problem Relation Age of Onset  . Diabetes Mother   . CAD Mother   . CAD Father   . Colon cancer Neg Hx     Social History Social History  Substance Use Topics  . Smoking status: Current Every Day Smoker    Packs/day: 1.00    Years: 15.00    Types: Cigarettes  . Smokeless tobacco: Never Used     Comment: Less than 1/2 pk a day. trying to quit.  03/04/12 1255 states "was doing good, but now up to about a pack per day"  . Alcohol use No     Allergies   Buprenorphine; Dexilant [dexlansoprazole]; Propoxyphene n-acetaminophen; and Zofran [ondansetron hcl]   Review of Systems Review of Systems  Constitutional: Negative for activity change.       All ROS Neg except as noted in HPI  HENT: Negative for nosebleeds.   Eyes: Negative for photophobia and discharge.  Respiratory: Negative for cough, shortness of breath and wheezing.   Cardiovascular: Negative for chest pain and palpitations.  Gastrointestinal: Negative for abdominal pain and blood in stool.  Genitourinary: Negative for dysuria, frequency and hematuria.  Musculoskeletal: Positive for back pain. Negative for arthralgias and neck pain.  Skin: Negative.   Neurological: Negative for dizziness, seizures and speech difficulty.  Psychiatric/Behavioral: Negative for confusion and hallucinations.     Physical Exam Updated Vital  Signs BP 120/72   Pulse 93   Temp 98.5 F (36.9 C)   Resp 18   Ht 5\' 9"  (1.753 m)   Wt 127 kg (280 lb)   SpO2 100%   BMI 41.35 kg/m   Physical Exam  Constitutional: He is oriented to person, place, and time. He appears well-developed and well-nourished.  Non-toxic appearance.  HENT:  Head: Normocephalic.  Right Ear: Tympanic  membrane and external ear normal.  Left Ear: Tympanic membrane and external ear normal.  Eyes: Pupils are equal, round, and reactive to light. EOM and lids are normal.  Neck: Normal range of motion. Neck supple. Carotid bruit is not present.  Cardiovascular: Normal rate, regular rhythm, normal heart sounds, intact distal pulses and normal pulses.   Pulmonary/Chest: Breath sounds normal. No respiratory distress.  Abdominal: Soft. Bowel sounds are normal. There is no tenderness. There is no guarding.  Musculoskeletal: Normal range of motion.       Lumbar back: He exhibits pain and spasm.       Back:  Lymphadenopathy:       Head (right side): No submandibular adenopathy present.       Head (left side): No submandibular adenopathy present.    He has no cervical adenopathy.  Neurological: He is alert and oriented to person, place, and time. He has normal strength. No cranial nerve deficit or sensory deficit.  Skin: Skin is warm and dry.  Psychiatric: He has a normal mood and affect. His speech is normal.  Nursing note and vitals reviewed.    ED Treatments / Results  Labs (all labs ordered are listed, but only abnormal results are displayed) Labs Reviewed - No data to display  EKG  EKG Interpretation None       Radiology No results found.  Procedures Procedures (including critical care time)  Medications Ordered in ED Medications - No data to display   Initial Impression / Assessment and Plan / ED Course  I have reviewed the triage vital signs and the nursing notes.  Pertinent labs & imaging results that were available during my care of  the patient were reviewed by me and considered in my medical decision making (see chart for details).       Final Clinical Impressions(s) / ED Diagnoses MDM I reviewed the emergency department visits concerning the lower back. I reviewed the x-rays.  No new gross neurologic deficits appreciated on examination. I suspect that this is an exacerbation of the pain the patient has been dealing with since June. I've asked the patient to continue to pursue the MRI that was scheduled by Dr. Hilda Lias. I've asked him to use a heating pad. Prescription for Medrol Dosepak, Zanaflex, ibuprofen, and Ultram given to the patient. I've also asked the patient to discuss his pain management with his primary physician an Dr. Hilda Lias.    Final diagnoses:  Right-sided low back pain with right-sided sciatica, unspecified chronicity    New Prescriptions Discharge Medication List as of 12/18/2016 12:33 AM    START taking these medications   Details  methylPREDNISolone (MEDROL DOSEPAK) 4 MG TBPK tablet 6,5,4,3,2,1 - take with food, Print    tiZANidine (ZANAFLEX) 4 MG capsule Take 1 capsule (4 mg total) by mouth 3 (three) times daily., Starting Wed 12/18/2016, Print         Ivery Quale, PA-C 12/18/16 [id number]    Donnetta Hutching, MD 12/18/16 [phone]

## 2016-12-17 NOTE — ED Triage Notes (Signed)
Pt c/o lower back pain and has been here for the same and has seen orthopedic. Pt states he has mri scheduled.

## 2016-12-18 ENCOUNTER — Ambulatory Visit: Payer: Medicaid Other | Admitting: Orthopaedic Surgery

## 2016-12-18 MED ORDER — IBUPROFEN 600 MG PO TABS: 600.0000 mg | ORAL_TABLET | Freq: Four times a day (QID) | ORAL | 0 refills | Status: DC

## 2016-12-18 MED ORDER — TRAMADOL HCL 50 MG PO TABS: 50.0000 mg | ORAL_TABLET | Freq: Four times a day (QID) | ORAL | 0 refills | Status: DC | PRN

## 2016-12-18 MED ORDER — METHYLPREDNISOLONE 4 MG PO TBPK: ORAL_TABLET | ORAL | 0 refills | Status: DC

## 2016-12-18 MED ORDER — TIZANIDINE HCL 4 MG PO CAPS: 4.0000 mg | ORAL_CAPSULE | Freq: Three times a day (TID) | ORAL | 0 refills | Status: DC

## 2016-12-18 NOTE — Discharge Instructions (Signed)
Your vital signs within normal limits. Your examination suggest lower back pain with sciatica. Lee's apply heating pad. Please use ibuprofen breakfast, lunch, dinner, and at bedtime. Please use Medrol Dosepak daily with food. Use Zanaflex 3 times daily for spasm pain. May use Ultram for more severe pain. Zanaflex and Ultram may cause drowsiness. Please use these medications with caution.

## 2016-12-24 ENCOUNTER — Telehealth: Payer: Self-pay | Admitting: Orthopaedic Surgery

## 2016-12-24 NOTE — Telephone Encounter (Signed)
Pt wants something for pain.  He is still waiting on MRI approval and need something for pain  Would you please write a prescription for him?

## 2016-12-25 NOTE — Telephone Encounter (Signed)
No narcotics.  Take Advil, Aleve or Tylenol. 

## 2017-01-15 ENCOUNTER — Ambulatory Visit (INDEPENDENT_AMBULATORY_CARE_PROVIDER_SITE_OTHER): Payer: Medicaid Other | Admitting: Orthopaedic Surgery

## 2017-01-15 VITALS — BP 133/81 | HR 76 | Temp 97.5°F | Ht 69.0 in | Wt 302.0 lb

## 2017-01-15 DIAGNOSIS — G8929 Other chronic pain: Secondary | ICD-10-CM

## 2017-01-15 DIAGNOSIS — M5441 Lumbago with sciatica, right side: Secondary | ICD-10-CM | POA: Diagnosis not present

## 2017-01-15 MED ORDER — NAPROXEN 500 MG PO TABS: 500.0000 mg | ORAL_TABLET | Freq: Two times a day (BID) | ORAL | 5 refills | Status: DC

## 2017-01-15 NOTE — Progress Notes (Signed)
Patient [name], male DOB:[birth year], 36 y.o. AOZ:[id number]  Chief Complaint  Patient presents with  . Results    MRI Lumbar Spine    HPI  [name] is a 36 y.o. male who has lower back pain after injury.  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  I have explained the findings to him.  He back had pre-existing changes aggravated by his injury.  I have recommended he resume his PT.  I will call in naprosyn.  Stop the ibuprofen.  I have encouraged him to lose weight and stop smoking. HPI  Body mass index is 44.6 kg/m.  ROS  Review of Systems  Constitutional:       Patient does not have Diabetes Mellitus. Patient has hypertension. Patient does have COPD or shortness of breath. Patient has BMI > 35. Patient has current smoking history.   Respiratory: Positive for cough, shortness of breath and wheezing.        Sleep apnea  Gastrointestinal: Positive for nausea and vomiting.       GERD  Musculoskeletal: Positive for arthralgias, back pain and gait problem.  Neurological: Negative for numbness.  Psychiatric/Behavioral: Positive for sleep disturbance. The patient is nervous/anxious.     Past Medical History:  Diagnosis Date  . Alpha-1-antitrypsin deficiency carrier [facility])    labs 05/2016 with documented MZ allele  . Anxiety   . Bipolar 2 disorder (HCC)   . Chronic abdominal pain    HIDA scan Sept 2010, EF 93%, s/p chole oct 2010; evaluated at [facility] for abdominal wall pain; Imipramine added May 2011  . Chronic vomiting    normal GES 2009  . Depression    increased over past several months  . GERD (gastroesophageal reflux disease)    Bravo study Nov 2009 on Prilosec 40 BID with adequate acid suppression  . Hiatal hernia   . Hypertension   . S/P endoscopy    2008 Dr. Karilyn Cota: erosive reflux esophagitis, antral gastritis, H.pylori serologies neg, Nov 2009 Dr. Darrick Penna:  gastritis, benign esophageal polyp, no H.pylori,  Feb 2011: [facility], Dr. Bubba Hales: normal esophagus, normal stomach, normal duodenum, path unremarkable  . Shortness of breath    SOB even with no exertion  . Skull fracture (HCC)   . Sleep apnea    supposed to sleep with CPAP  . Ventral hernia     Past Surgical History:  Procedure Laterality Date  . APPENDECTOMY  5/10  . BIOPSY  08/15/2015   Procedure: BIOPSY;  Surgeon: West Bali, MD;  [facility];  Service: Endoscopy;;  gastric bx  . CHOLECYSTECTOMY  10/10  . ESOPHAGOGASTRODUODENOSCOPY  05/2006   H.pylori neg  . ESOPHAGOGASTRODUODENOSCOPY  03/23/2008   Fields-gastritis benign esophageal polyp, otherwise normal. Negative H. pylori he (propofol)  . ESOPHAGOGASTRODUODENOSCOPY (EGD) WITH PROPOFOL N/A 08/15/2015   Procedure: ESOPHAGOGASTRODUODENOSCOPY (EGD) WITH PROPOFOL;  Surgeon: West Bali, MD;  Location: [facility];  Service: Endoscopy;  Laterality: N/A;  [id number]  . INCISIONAL HERNIA REPAIR  03/04/2012   Procedure: LAPAROSCOPIC INCISIONAL HERNIA;  Surgeon: Dalia Heading, MD;  Location: [facility] ORS;  Service: General;  Laterality: N/A;  repair, recurrent  . INCISIONAL HERNIA REPAIR N/A 01/12/2014   Procedure: HERNIA REPAIR INCISIONAL WITH MESH;  Surgeon: Dalia Heading, MD;  Location: [facility] ORS;  Service: General;  Laterality: N/A;  . INGUINAL HERNIA REPAIR     child  . INSERTION  OF MESH  03/04/2012   Procedure: INSERTION OF MESH;  Surgeon: Dalia Heading, MD;  Location: [facility] ORS;  Service: General;  Laterality: N/A;  . INSERTION OF MESH N/A 01/12/2014   Procedure: INSERTION OF MESH;  Surgeon: Dalia Heading, MD;  Location: [facility] ORS;  Service: General;  Laterality: N/A;  . KNEE SURGERY Right 2009  . LEFT HEART CATH AND CORONARY ANGIOGRAPHY N/A 06/12/2016   Procedure: Left Heart Cath and Coronary Angiography;  Surgeon: Corky Crafts, MD;  Location: [facility] INVASIVE CV LAB;  Service: Cardiovascular;  Laterality: N/A;  . VENTRAL HERNIA  REPAIR  March 2012   Dr. Lovell Sheehan    Family History  Problem Relation Age of Onset  . Diabetes Mother   . CAD Mother   . CAD Father   . Colon cancer Neg Hx     Social History Social History  Substance Use Topics  . Smoking status: Current Every Day Smoker    Packs/day: 1.00    Years: 15.00    Types: Cigarettes  . Smokeless tobacco: Never Used     Comment: Less than 1/2 pk a day. trying to quit.  03/04/12 1255 states "was doing good, but now up to about a pack per day"  . Alcohol use No    Allergies  Allergen Reactions  . Buprenorphine Anaphylaxis  . Dexilant [Dexlansoprazole] Other (See Comments)    Pass out  . Propoxyphene N-Acetaminophen Other (See Comments)    unknown  . Zofran [Ondansetron Hcl] Nausea And Vomiting    Current Outpatient Prescriptions  Medication Sig Dispense Refill  . albuterol (PROVENTIL HFA;VENTOLIN HFA) 108 (90 Base) MCG/ACT inhaler Inhale 2 puffs into the lungs every 6 (six) hours as needed for wheezing or shortness of breath.    . ALPRAZolam (XANAX) 1 MG tablet Take 1 mg by mouth 4 (four) times daily.     Marland Kitchen amLODipine (NORVASC) 5 MG tablet Take 5 mg by mouth daily.    . budesonide-formoterol (SYMBICORT) 160-4.5 MCG/ACT inhaler Inhale 2 puffs into the lungs 2 (two) times daily.    . Cariprazine HCl (VRAYLAR) 3 MG CAPS Take 3 mg by mouth daily.    . cyclobenzaprine (FLEXERIL) 10 MG tablet Take 1 tablet (10 mg total) by mouth 3 (three) times daily. 20 tablet 0  . dexamethasone (DECADRON) 4 MG tablet Take 1 tablet (4 mg total) by mouth 2 (two) times daily with a meal. 12 tablet 0  . diltiazem (CARDIZEM) 30 MG tablet Take 1 tablet (30 mg total) by mouth 4 (four) times daily. 120 tablet 0  . fluticasone (FLONASE) 50 MCG/ACT nasal spray Place 2 sprays into both nostrils daily as needed for allergies.     Marland Kitchen HYDROcodone-acetaminophen (NORCO) 7.5-325 MG tablet One every six hours as needed for pain.  Seven day limit per Medicaid guidelines. (Patient taking  differently: Take 1 tablet by mouth every 6 (six) hours as needed for moderate pain or severe pain. One every six hours as needed for pain.  Seven day limit per Medicaid guidelines.) 28 tablet 0  . ibuprofen (ADVIL,MOTRIN) 600 MG tablet Take 1 tablet (600 mg total) by mouth 4 (four) times daily. 30 tablet 0  . lisinopril (PRINIVIL,ZESTRIL) 40 MG tablet Take 1 tablet (40 mg total) by mouth daily. 90 tablet 2  . loratadine (CLARITIN) 10 MG tablet Take 10 mg by mouth daily as needed for allergies.    . methylPREDNISolone (MEDROL DOSEPAK) 4 MG TBPK tablet 6,5,4,3,2,1 - take with food 21 tablet  0  . metoprolol tartrate (LOPRESSOR) 25 MG tablet Take 1 tablet (25 mg total) by mouth 2 (two) times daily. 60 tablet 3  . naproxen (NAPROSYN) 500 MG tablet Take 1 tablet (500 mg total) by mouth 2 (two) times daily with a meal. 60 tablet 5  . nitroGLYCERIN (NITROSTAT) 0.4 MG SL tablet Place 1 tablet (0.4 mg total) under the tongue every 5 (five) minutes as needed for chest pain. 25 tablet 3  . omeprazole (PRILOSEC) 40 MG capsule Take 40 mg by mouth 2 (two) times daily.    . rizatriptan (MAXALT) 10 MG tablet Take 10 mg by mouth as needed for migraine. May repeat in 2 hours if needed    . tiZANidine (ZANAFLEX) 4 MG capsule Take 1 capsule (4 mg total) by mouth 3 (three) times daily. 21 capsule 0  . traMADol (ULTRAM) 50 MG tablet Take 1 tablet (50 mg total) by mouth every 6 (six) hours as needed. 10 tablet 0  . vortioxetine HBr (TRINTELLIX) 10 MG TABS Take 10 mg by mouth daily.     No current facility-administered medications for this visit.      Physical Exam  Blood pressure 133/81, pulse 76, temperature (!) 97.5 F (36.4 C), height  (1.753 m), weight (!) 302 lb (137 kg).  Constitutional: overall normal hygiene, normal nutrition, well developed, normal grooming, normal body habitus. Assistive device:none  Musculoskeletal: gait and station Limp none, muscle tone and strength are normal, no tremors or  atrophy is present.  .  Neurological: coordination overall normal.  Deep tendon reflex/nerve stretch intact.  Sensation normal.  Cranial nerves II-XII intact.   Skin:   Normal overall no scars, lesions, ulcers or rashes. No psoriasis.  Psychiatric: Alert and oriented x 3.  Recent memory intact, remote memory unclear.  Normal mood and affect. Well groomed.  Good eye contact.  Cardiovascular: overall no swelling, no varicosities, no edema bilaterally, normal temperatures of the legs and arms, no clubbing, cyanosis and good capillary refill.  Lymphatic: palpation is normal.  All other systems reviewed and are negative   Spine/Pelvis examination:  Inspection:  Overall, sacoiliac joint benign and hips nontender; without crepitus or defects.   Thoracic spine inspection: Alignment normal without kyphosis present   Lumbar spine inspection:  Alignment  with normal lumbar lordosis, without scoliosis apparent.   Thoracic spine palpation:  without tenderness of spinal processes   Lumbar spine palpation: with tenderness of lumbar area; without tightness of lumbar muscles    Range of Motion:   Lumbar flexion, forward flexion is 30 with pain or tenderness    Lumbar extension is 5 with pain or tenderness   Left lateral bend is Normal  without pain or tenderness   Right lateral bend is Normal without pain or tenderness   Straight leg raising is Normal   Strength & tone: Normal   Stability overall normal stability    The patient has been educated about the nature of the problem(s) and counseled on treatment options.  The patient appeared to understand what I have discussed and is in agreement with it.  Encounter Diagnoses  Name Primary?  . Chronic right-sided low back pain with right-sided sciatica Yes  . Morbid obesity due to excess calories [facility])     PLAN Call if any problems.  Precautions discussed.  Continue current medications.   Return to clinic 1 month   Electronically  Signed Darreld Mclean, MD 9/12/20189:53 AM

## 2017-01-15 NOTE — Patient Instructions (Signed)
Steps to Quit Smoking Smoking tobacco can be bad for your health. It can also affect almost every organ in your body. Smoking puts you and people around you at risk for many serious Olla Delancey-lasting (chronic) diseases. Quitting smoking is hard, but it is one of the best things that you can do for your health. It is never too late to quit. What are the benefits of quitting smoking? When you quit smoking, you lower your risk for getting serious diseases and conditions. They can include:  Lung cancer or lung disease.  Heart disease.  Stroke.  Heart attack.  Not being able to have children (infertility).  Weak bones (osteoporosis) and broken bones (fractures).  If you have coughing, wheezing, and shortness of breath, those symptoms may get better when you quit. You may also get sick less often. If you are pregnant, quitting smoking can help to lower your chances of having a baby of low birth weight. What can I do to help me quit smoking? Talk with your doctor about what can help you quit smoking. Some things you can do (strategies) include:  Quitting smoking totally, instead of slowly cutting back how much you smoke over a period of time.  Going to in-person counseling. You are more likely to quit if you go to many counseling sessions.  Using resources and support systems, such as: ? Online chats with a counselor. ? Phone quitlines. ? Printed self-help materials. ? Support groups or group counseling. ? Text messaging programs. ? Mobile phone apps or applications.  Taking medicines. Some of these medicines may have nicotine in them. If you are pregnant or breastfeeding, do not take any medicines to quit smoking unless your doctor says it is okay. Talk with your doctor about counseling or other things that can help you.  Talk with your doctor about using more than one strategy at the same time, such as taking medicines while you are also going to in-person counseling. This can help make  quitting easier. What things can I do to make it easier to quit? Quitting smoking might feel very hard at first, but there is a lot that you can do to make it easier. Take these steps:  Talk to your family and friends. Ask them to support and encourage you.  Call phone quitlines, reach out to support groups, or work with a counselor.  Ask people who smoke to not smoke around you.  Avoid places that make you want (trigger) to smoke, such as: ? Bars. ? Parties. ? Smoke-break areas at work.  Spend time with people who do not smoke.  Lower the stress in your life. Stress can make you want to smoke. Try these things to help your stress: ? Getting regular exercise. ? Deep-breathing exercises. ? Yoga. ? Meditating. ? Doing a body scan. To do this, close your eyes, focus on one area of your body at a time from head to toe, and notice which parts of your body are tense. Try to relax the muscles in those areas.  Download or buy apps on your mobile phone or tablet that can help you stick to your quit plan. There are many free apps, such as QuitGuide from the CDC (Centers for Disease Control and Prevention). You can find more support from smokefree.gov and other websites.  This information is not intended to replace advice given to you by your health care provider. Make sure you discuss any questions you have with your health care provider. Document Released: 02/16/2009 Document   Revised: 12/19/2015 Document Reviewed: 09/06/2014 Elsevier Interactive Patient Education  2018 Elsevier Inc.  

## 2017-02-12 ENCOUNTER — Ambulatory Visit: Payer: Medicaid Other | Admitting: Orthopaedic Surgery

## 2017-03-11 ENCOUNTER — Encounter (HOSPITAL_COMMUNITY): Payer: Self-pay | Admitting: Physical Therapy

## 2017-03-11 ENCOUNTER — Ambulatory Visit (HOSPITAL_COMMUNITY): Payer: Medicaid Other | Attending: Orthopaedic Surgery | Admitting: Physical Therapy

## 2017-03-11 DIAGNOSIS — R262 Difficulty in walking, not elsewhere classified: Secondary | ICD-10-CM | POA: Insufficient documentation

## 2017-03-11 DIAGNOSIS — M6281 Muscle weakness (generalized): Secondary | ICD-10-CM | POA: Diagnosis present

## 2017-03-11 DIAGNOSIS — M545 Low back pain, unspecified: Secondary | ICD-10-CM
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
## 2017-03-19 ENCOUNTER — Encounter (HOSPITAL_COMMUNITY): Payer: Self-pay | Admitting: Physical Therapy

## 2017-03-19 ENCOUNTER — Ambulatory Visit (HOSPITAL_COMMUNITY): Payer: Medicaid Other | Admitting: Physical Therapy

## 2017-03-19 DIAGNOSIS — M545 Low back pain, unspecified: Secondary | ICD-10-CM

## 2017-03-19 DIAGNOSIS — M6281 Muscle weakness (generalized): Secondary | ICD-10-CM

## 2017-03-19 DIAGNOSIS — R262 Difficulty in walking, not elsewhere classified: Secondary | ICD-10-CM

## 2017-03-19 NOTE — Patient Instructions (Signed)
   Wall Slides with Lift Off: 1-2 x 10 repetitions  Stand very close to a wall and slide both arm up extended. Keeping your core tight lift off your arms without extending your back. Return and repeat
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
## 2017-03-25 ENCOUNTER — Ambulatory Visit (HOSPITAL_COMMUNITY): Payer: Medicaid Other

## 2017-03-25 ENCOUNTER — Telehealth (HOSPITAL_COMMUNITY): Payer: Self-pay

## 2017-03-25 NOTE — Telephone Encounter (Signed)
No show, called and spoke to pt. who thought apt was scheduled tomorrow.  Reminded next apt date and time with contact info given.    [address] [place]; CBIS [phone]'

## 2017-04-01 ENCOUNTER — Ambulatory Visit (HOSPITAL_COMMUNITY): Payer: Medicaid Other | Admitting: Physical Therapy

## 2017-04-01 ENCOUNTER — Telehealth (HOSPITAL_COMMUNITY): Payer: Self-pay | Admitting: Physical Therapy

## 2017-04-01 NOTE — Telephone Encounter (Signed)
No-show #2; called and spoke to patient, who states he did not come as his back is bothering him quite a bit today. Educated on no-show policy and that he can schedule only one appointment at a time now. Discussed next scheduled session, transferred patient up to front desk to try to move to earlier appointment.    Nedra HaiKristen Tahjay Binion PT, DPT, CBIS  Supplemental Physical Therapist [facility]

## 2017-04-09 ENCOUNTER — Encounter (HOSPITAL_COMMUNITY): Payer: Self-pay

## 2017-04-09 ENCOUNTER — Ambulatory Visit (HOSPITAL_COMMUNITY): Payer: Medicaid Other | Attending: Orthopaedic Surgery

## 2017-04-09 DIAGNOSIS — R262 Difficulty in walking, not elsewhere classified: Secondary | ICD-10-CM | POA: Diagnosis present

## 2017-04-09 DIAGNOSIS — M545 Low back pain, unspecified: Secondary | ICD-10-CM

## 2017-04-09 DIAGNOSIS — R531 Weakness: Secondary | ICD-10-CM | POA: Diagnosis present

## 2017-04-09 DIAGNOSIS — M6281 Muscle weakness (generalized): Secondary | ICD-10-CM | POA: Diagnosis present
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
## 2017-04-14 IMAGING — RF DG UGI W/ HIGH DENSITY W/KUB
14 of 16 series · 14 of 24 positions shown · non-contrast
Comparison: Abdominal radiograph 04/27/2015

CLINICAL DATA: Hiatal hernia, atypical chest pain, epigastric pain,
worsening chest pain, gastroesophageal reflux disease, prior hiatal
hernia repair

EXAM:
UPPER GI SERIES WITH KUB
TECHNIQUE: After obtaining a scout radiograph a routine upper GI series was
performed using thin and thick barium with effervescent granules and
a barium tablet.
FLUOROSCOPY TIME:  Fluoroscopy Time:  3 minutes 18 seconds
Radiation Exposure Index (if provided by the fluoroscopic device):
140.3 mGy
Number of Acquired Spot Images: 2 plus multiple screen captures
during fluoroscopy

[Series 1: t abdomen supine · 0.15mm/px · 1 of 1 slices shown]
[im 1/1]
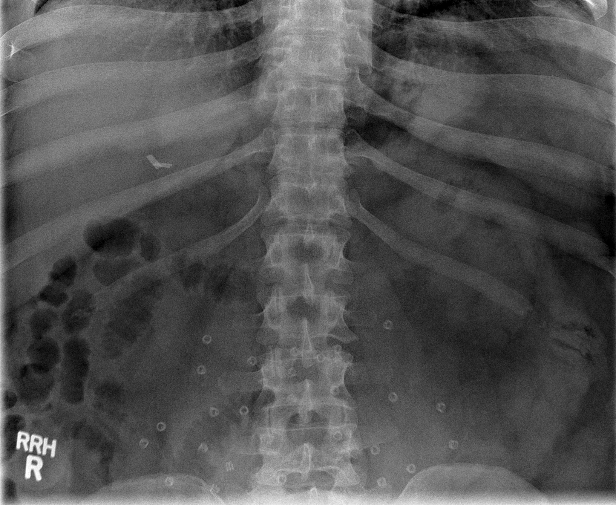

[Series 3: cp_standard · 0.18mm/px · 1 of 28 frames shown (1 of 13)]
[frame 15/28]
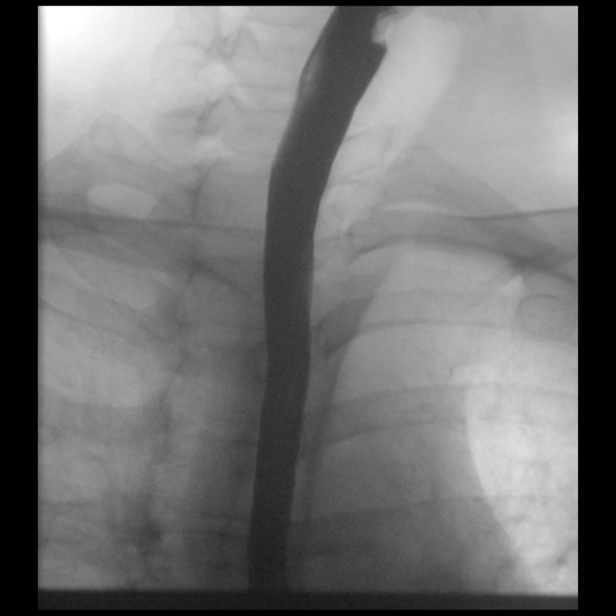

[Series 4: cp_standard · 0.18mm/px · 1 of 22 frames shown (2 of 13)]
[frame 22/22]
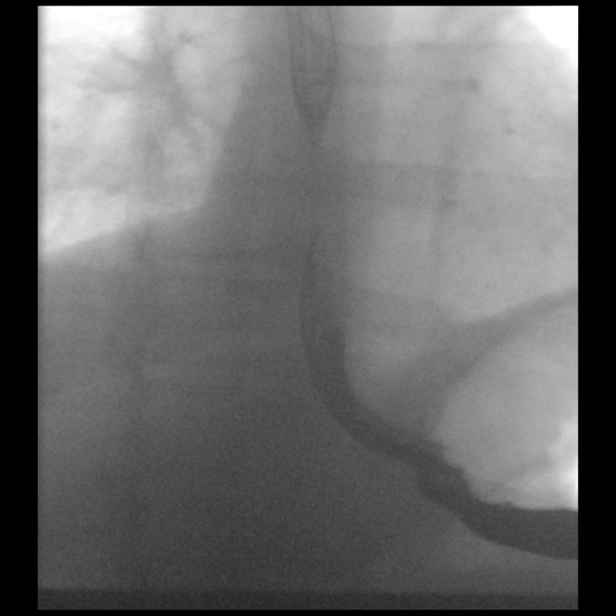

[Series 5: cp_standard · 0.18mm/px · 1 of 98 frames shown (3 of 13)]
[frame 84/98]
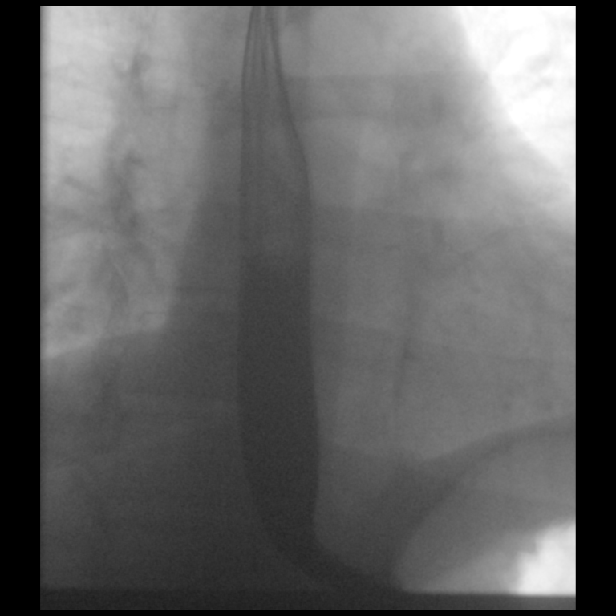

[Series 6: cp_standard · 0.19mm/px · 1 of 30 frames shown (4 of 13)]
[frame 14/30]
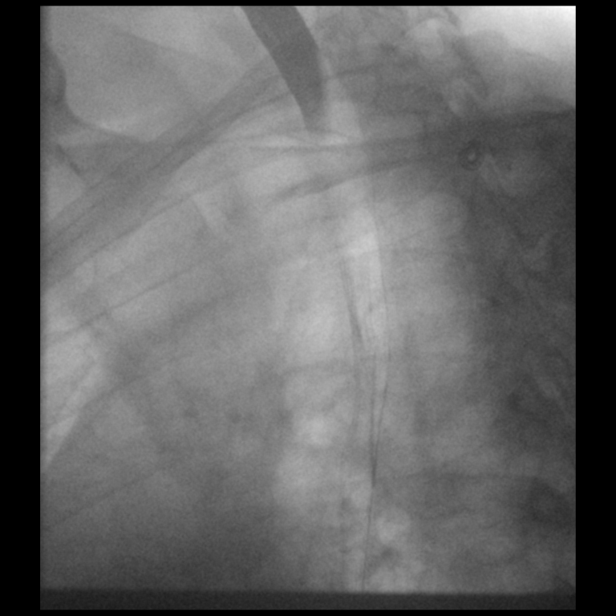

[Series 6: cp_standard · 0.19mm/px · 1 of 25 frames shown (5 of 13)]
[frame 13/25]
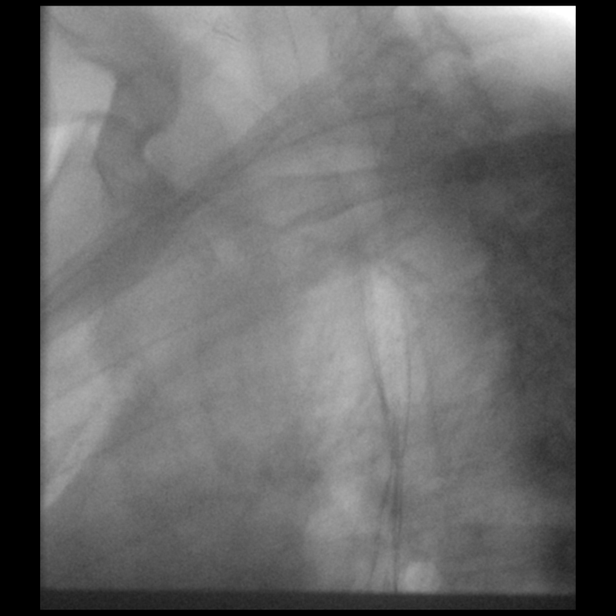

[Series 7: cp_standard · 0.19mm/px · 1 of 23 frames shown (6 of 13)]
[frame 12/23]
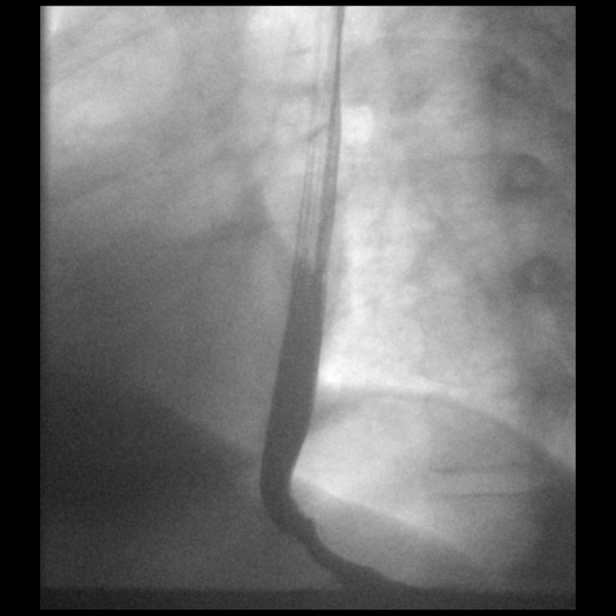

[Series 9: cp_standard · 0.19mm/px · 1 of 28 frames shown (7 of 13)]
[frame 15/28]
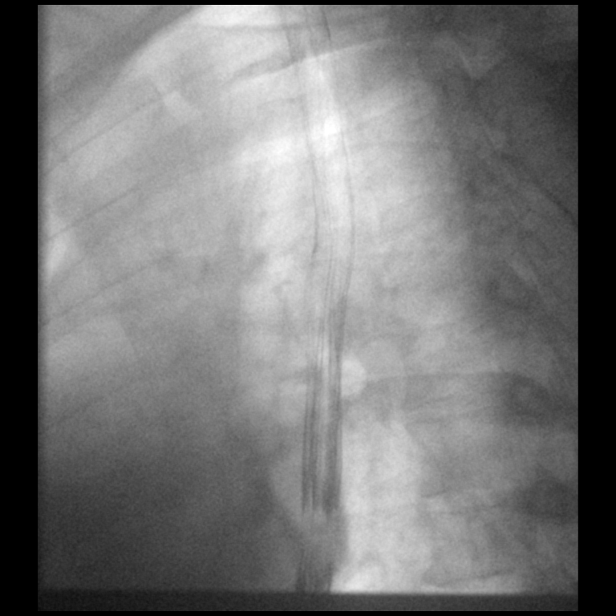

[Series 11: cp_standard · 0.18mm/px · 1 of 1 slices shown (8 of 13)]
[im 1/1]
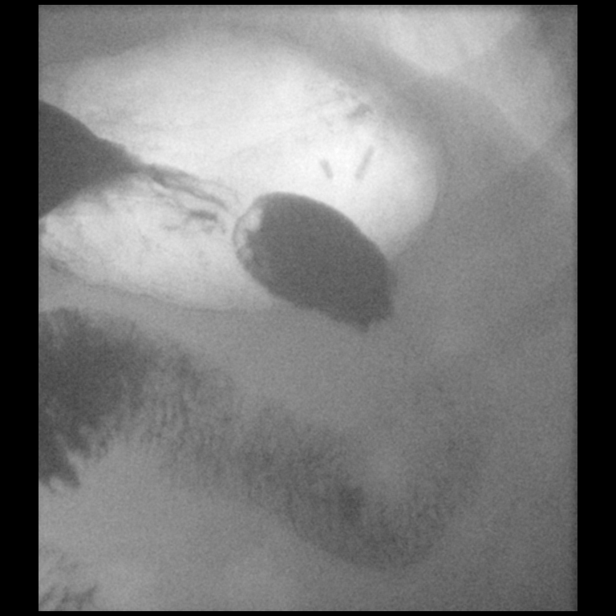

[Series 15: cp_standard · 0.18mm/px · 1 of 1 slices shown (9 of 13)]
[im 1/1]
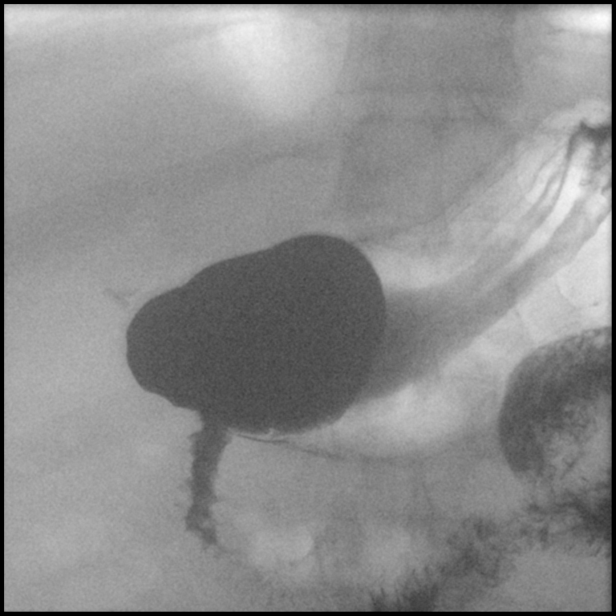

[Series 19: cp_standard · 0.19mm/px · 1 of 1 slices shown (10 of 13)]
[im 1/1]
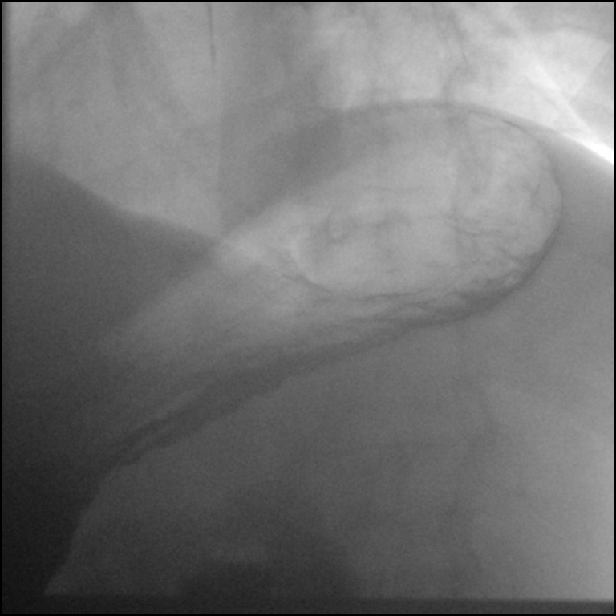

[Series 21: cp_standard · 0.19mm/px · 1 of 1 slices shown (11 of 13)]
[im 1/1]
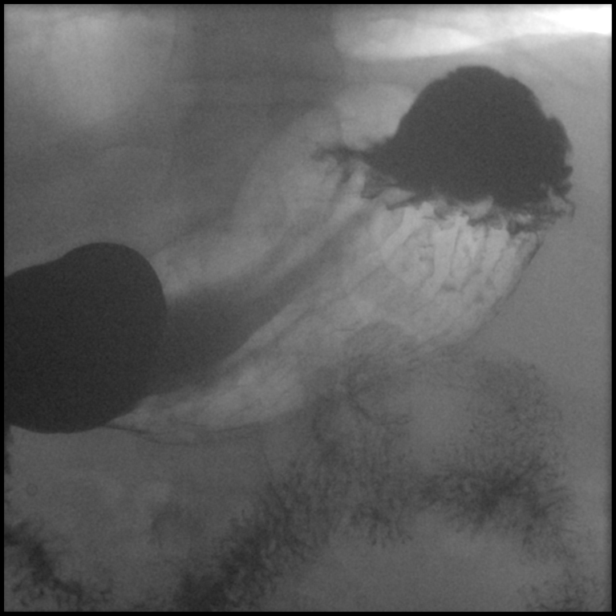

[Series 23: cp_standard · 0.18mm/px · 1 of 18 frames shown (12 of 13)]
[frame 17/18]
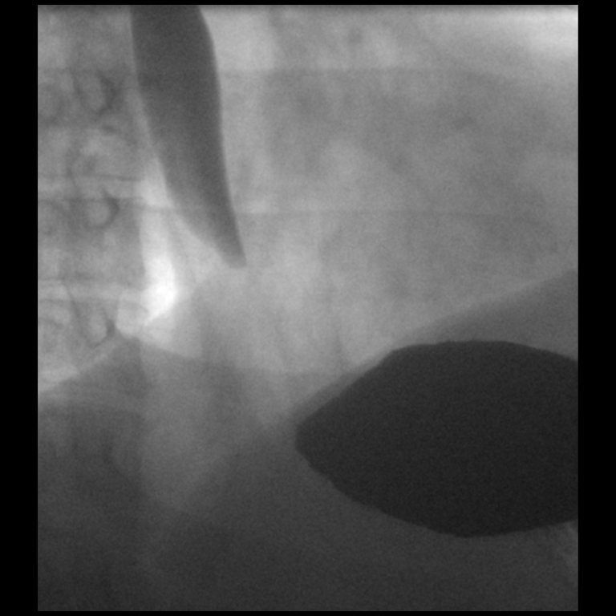

[Series 24: cp_standard · 0.18mm/px · 1 of 45 frames shown (13 of 13)]
[frame 39/45]
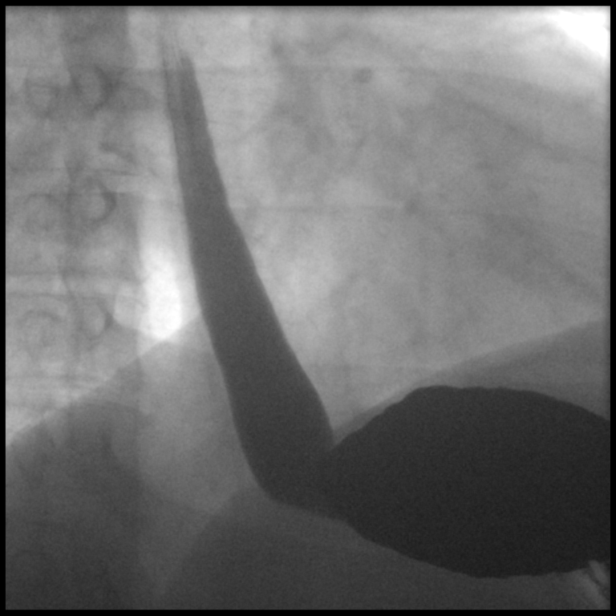

[14 of 24 positions shown; findings below may reference images not displayed]

FINDINGS: Numerous coils mid abdomen from prior ventral hernia repair 8.

Surgical clips RIGHT upper quadrant likely reflect prior
cholecystectomy.

Normal bowel gas pattern.

Normal esophageal distention and motility.

Smooth appearance of esophageal walls without irregularity, mass or
stricture.

No hiatal hernia or definite filling defects associated with a prior
fundoplication are identified.

Small amount of gastroesophageal reflux was seen during the exam.

Stomach distends normally with normal rugal fold pattern.

No gastric mass, ulceration, or outlet obstruction.

Duodenal bulb and sweep normal appearance.

Ligament of Treitz normal position.

Jejunal loops unremarkable.
IMPRESSION: Small amount of gastroesophageal reflux during exam.

Otherwise normal study.

No evidence of hiatal hernia or definite evidence of esophagitis.

## 2017-04-15 ENCOUNTER — Ambulatory Visit (HOSPITAL_COMMUNITY): Payer: Medicaid Other

## 2017-04-16 ENCOUNTER — Encounter (HOSPITAL_COMMUNITY): Payer: Self-pay

## 2017-04-16 ENCOUNTER — Ambulatory Visit (HOSPITAL_COMMUNITY): Payer: Medicaid Other

## 2017-04-16 DIAGNOSIS — M545 Low back pain, unspecified: Secondary | ICD-10-CM

## 2017-04-16 DIAGNOSIS — M6281 Muscle weakness (generalized): Secondary | ICD-10-CM

## 2017-04-16 DIAGNOSIS — R531 Weakness: Secondary | ICD-10-CM

## 2017-04-16 DIAGNOSIS — R262 Difficulty in walking, not elsewhere classified: Secondary | ICD-10-CM
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
## 2017-05-01 ENCOUNTER — Ambulatory Visit (HOSPITAL_COMMUNITY): Payer: Medicaid Other

## 2017-05-01 ENCOUNTER — Telehealth (HOSPITAL_COMMUNITY): Payer: Self-pay

## 2017-05-01 NOTE — Telephone Encounter (Signed)
No Show # 1: I called the patient at his mobile phone number on file and left a message to let him know we were checking in to see if he is alright and remind him of his next appointment. I informed him his appointment is on Thursday 05/08/17 at 4:45 PM and provided our office number [phone] to call if he can not make his next appointment.  Valentino Saxon, PT, DPT Physical Therapist with [facility]  05/01/2017 3:45 PM

## 2017-05-08 ENCOUNTER — Telehealth (HOSPITAL_COMMUNITY): Payer: Self-pay

## 2017-05-08 ENCOUNTER — Ambulatory Visit (HOSPITAL_COMMUNITY): Payer: No Typology Code available for payment source | Attending: Orthopaedic Surgery

## 2017-05-08 DIAGNOSIS — M6281 Muscle weakness (generalized): Secondary | ICD-10-CM | POA: Insufficient documentation

## 2017-05-08 DIAGNOSIS — R531 Weakness: Secondary | ICD-10-CM | POA: Insufficient documentation

## 2017-05-08 DIAGNOSIS — M545 Low back pain: Secondary | ICD-10-CM | POA: Insufficient documentation

## 2017-05-08 DIAGNOSIS — R262 Difficulty in walking, not elsewhere classified: Secondary | ICD-10-CM | POA: Insufficient documentation

## 2017-05-08 NOTE — Telephone Encounter (Signed)
I called the mobile phone number on file for [name]. He did not answer and I left a message letting him know he had missed his appointment today for 4:45 PM. I reminded him of his next appointment at 4:45 PM on Tuesday 05/13/2017. I asked him to please call the office if he has any problems with this time and date to reschedule or if he needs to cancel the appointment. I told him the office phone number: [phone].  Valentino Saxon, PT, DPT Physical Therapist with [facility]  05/08/2017 5:07 PM

## 2017-05-13 ENCOUNTER — Encounter (HOSPITAL_COMMUNITY): Payer: Self-pay | Admitting: *Deleted

## 2017-05-13 ENCOUNTER — Ambulatory Visit (HOSPITAL_COMMUNITY): Payer: No Typology Code available for payment source

## 2017-05-13 ENCOUNTER — Other Ambulatory Visit: Payer: Self-pay

## 2017-05-13 ENCOUNTER — Emergency Department (HOSPITAL_COMMUNITY)
Admission: EM | Admit: 2017-05-13 | Discharge: 2017-05-13 | Disposition: A | Discharge status: Home / Self Care | Payer: Medicaid Other | Attending: Emergency Medicine | Admitting: Emergency Medicine

## 2017-05-13 ENCOUNTER — Emergency Department (HOSPITAL_COMMUNITY): Payer: Medicaid Other

## 2017-05-13 DIAGNOSIS — J449 Chronic obstructive pulmonary disease, unspecified: Secondary | ICD-10-CM | POA: Diagnosis not present

## 2017-05-13 DIAGNOSIS — F17210 Nicotine dependence, cigarettes, uncomplicated: Secondary | ICD-10-CM | POA: Diagnosis not present

## 2017-05-13 DIAGNOSIS — R262 Difficulty in walking, not elsewhere classified: Secondary | ICD-10-CM

## 2017-05-13 DIAGNOSIS — M545 Low back pain, unspecified: Secondary | ICD-10-CM

## 2017-05-13 DIAGNOSIS — F319 Bipolar disorder, unspecified: Secondary | ICD-10-CM | POA: Insufficient documentation

## 2017-05-13 DIAGNOSIS — Z79899 Other long term (current) drug therapy: Secondary | ICD-10-CM | POA: Diagnosis not present

## 2017-05-13 DIAGNOSIS — M6281 Muscle weakness (generalized): Secondary | ICD-10-CM

## 2017-05-13 DIAGNOSIS — Z9049 Acquired absence of other specified parts of digestive tract: Secondary | ICD-10-CM | POA: Diagnosis not present

## 2017-05-13 DIAGNOSIS — R079 Chest pain, unspecified: Secondary | ICD-10-CM | POA: Insufficient documentation

## 2017-05-13 DIAGNOSIS — R531 Weakness: Secondary | ICD-10-CM

## 2017-05-13 DIAGNOSIS — I10 Essential (primary) hypertension: Secondary | ICD-10-CM | POA: Insufficient documentation

## 2017-05-13 DIAGNOSIS — J9801 Acute bronchospasm: Secondary | ICD-10-CM | POA: Diagnosis not present

## 2017-05-13 DIAGNOSIS — F419 Anxiety disorder, unspecified: Secondary | ICD-10-CM | POA: Diagnosis not present

## 2017-05-13 LAB — TROPONIN I

## 2017-05-13 LAB — CBC WITH DIFFERENTIAL/PLATELET
BASOS ABS: 0 10*3/uL (ref 0.0–0.1)
BASOS PCT: 0 %
Eosinophils Absolute: 0.3 10*3/uL (ref 0.0–0.7)
Eosinophils Relative: 3 %
HEMATOCRIT: 48.7 % (ref 39.0–52.0)
HEMOGLOBIN: 16.4 g/dL (ref 13.0–17.0)
Lymphocytes Relative: 28 %
Lymphs Abs: 2.9 10*3/uL (ref 0.7–4.0)
MCH: 31.5 pg (ref 26.0–34.0)
MCHC: 33.7 g/dL (ref 30.0–36.0)
MCV: 93.7 fL (ref 78.0–100.0)
MONOS PCT: 9 %
Monocytes Absolute: 0.9 10*3/uL (ref 0.1–1.0)
NEUTROS ABS: 6.3 10*3/uL (ref 1.7–7.7)
Neutrophils Relative %: 60 %
Platelets: 254 10*3/uL (ref 150–400)
RBC: 5.2 MIL/uL (ref 4.22–5.81)
RDW: 12.9 % (ref 11.5–15.5)
WBC: 10.5 10*3/uL (ref 4.0–10.5)

## 2017-05-13 LAB — BASIC METABOLIC PANEL
ANION GAP: 11 (ref 5–15)
BUN: 16 mg/dL (ref 6–20)
CHLORIDE: 100 mmol/L — AB (ref 101–111)
CO2: 24 mmol/L (ref 22–32)
Calcium: 9.3 mg/dL (ref 8.9–10.3)
Creatinine, Ser: 1.03 mg/dL (ref 0.61–1.24)
GFR calc non Af Amer: >60 mL/min (ref >60)
Glucose, Bld: 125 mg/dL — ABNORMAL HIGH (ref 65–99)
POTASSIUM: 3.8 mmol/L (ref 3.5–5.1)
SODIUM: 135 mmol/L (ref 135–145)

## 2017-05-13 LAB — D-DIMER, QUANTITATIVE (NOT AT ARMC): D DIMER QUANT: 0.31 ug{FEU}/mL (ref 0.00–0.50)

## 2017-05-13 MED ORDER — GI COCKTAIL ~~LOC~~
30.0000 mL | Freq: Once | ORAL | Status: AC
  Administered 2017-05-13: 30 mL via ORAL
  Filled 2017-05-13: qty 30

## 2017-05-13 MED ORDER — ACETAMINOPHEN 325 MG PO TABS
650.0000 mg | ORAL_TABLET | ORAL | Status: DC | PRN
  Administered 2017-05-13: 650 mg via ORAL
  Filled 2017-05-13: qty 2

## 2017-05-13 MED ORDER — ALBUTEROL (5 MG/ML) CONTINUOUS INHALATION SOLN
10.0000 mg/h | INHALATION_SOLUTION | RESPIRATORY_TRACT | Status: DC
  Administered 2017-05-13: 10 mg/h via RESPIRATORY_TRACT
  Filled 2017-05-13: qty 20

## 2017-05-13 MED ORDER — METHYLPREDNISOLONE SODIUM SUCC 125 MG IJ SOLR
125.0000 mg | Freq: Once | INTRAMUSCULAR | Status: AC
  Administered 2017-05-13: 125 mg via INTRAVENOUS
  Filled 2017-05-13: qty 2

## 2017-05-13 MED ORDER — ALBUTEROL SULFATE (2.5 MG/3ML) 0.083% IN NEBU
5.0000 mg | INHALATION_SOLUTION | Freq: Once | RESPIRATORY_TRACT | Status: AC
  Administered 2017-05-13: 5 mg via RESPIRATORY_TRACT
  Filled 2017-05-13: qty 6

## 2017-05-13 MED ORDER — LORAZEPAM 2 MG/ML IJ SOLN
1.0000 mg | Freq: Once | INTRAMUSCULAR | Status: AC
  Administered 2017-05-13: 1 mg via INTRAVENOUS
  Filled 2017-05-13: qty 1

## 2017-05-13 MED ORDER — PREDNISONE 20 MG PO TABS: ORAL_TABLET | ORAL | 0 refills | Status: DC

## 2017-05-13 NOTE — ED Provider Notes (Signed)
[facility] EMERGENCY DEPARTMENT Provider Note   CSN: [id number] Arrival date & time: 05/13/17  1837     History   Chief Complaint Chief Complaint  Patient presents with  . Chest Pain    HPI [name] is a 37 y.o. male.  HPI Patient presents with left-sided chest pain starting roughly 30-40 minutes prior to arrival.  States he was driving when he became short of breath, diaphoretic and had onset of the chest pain.  States he has had multiple similar symptoms in the past.  Had cardiac catheterization 1 year ago with no significant coronary artery disease.  States he has been wheezing and has been out of his inhaler.  Denies any cough, fever or chills.  No recent extended travel or immobilization. Past Medical History:  Diagnosis Date  . 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 Anxiety   . Bipolar 2 disorder (HCC)   . Chronic abdominal pain    HIDA scan Sept 2010, EF 93%, s/p chole oct 2010; evaluated at [facility] for abdominal wall pain; Imipramine added May 2011  . Chronic vomiting    normal GES 2009  . Depression    increased over past several months  . GERD (gastroesophageal reflux disease)    Bravo study Nov 2009 on Prilosec 40 BID with adequate acid suppression  . Hiatal hernia   . Hypertension   . S/P endoscopy    2008 Dr. Karilyn Cota: erosive reflux esophagitis, antral gastritis, H.pylori serologies neg, Nov 2009 Dr. Darrick Penna: gastritis, benign esophageal polyp, no H.pylori,  Feb 2011: [facility], Dr. Bubba Hales: normal esophagus, normal stomach, normal duodenum, path unremarkable  . Shortness of breath    SOB even with no exertion  . Skull fracture (HCC)   . Sleep apnea    supposed to sleep with CPAP  . Ventral hernia     Patient Active Problem List   Diagnosis Date Noted  . Hiatal hernia 06/14/2016  . Alpha-1-antitrypsin deficiency carrier (HCC) 06/14/2016  . Atypical chest pain 06/14/2016  . Abnormal nuclear stress  test   . Intracranial hemorrhage (HCC) 01/20/2016  . Nausea without vomiting 12/15/2015  . Early satiety 07/25/2015  . Gastroesophageal reflux disease with esophagitis 06/27/2015  . Nausea vomiting and diarrhea 04/27/2015  . Transaminitis 04/27/2015  . Cough 06/23/2013  . Tobacco abuse counseling 06/23/2013  . Abdominal cramping 05/05/2013  . COPD (chronic obstructive pulmonary disease) with acute bronchitis (HCC) 05/05/2013  . Syncope 05/05/2013  . Depression with anxiety 05/05/2013  . Hyperglycemia 05/05/2013  . Black-out (not amnesia) 05/05/2013  . Smoking 04/16/2013  . Blackout spell 04/16/2013  . Hx of bipolar disorder 04/16/2013  . GERD (gastroesophageal reflux disease) 04/16/2013  . OSA (obstructive sleep apnea) 04/16/2013  . Dyspnea 04/16/2013  . Bipolar 2 disorder (HCC) 03/08/2013  . HTN (hypertension) 03/08/2013  . Tobacco abuse 03/08/2013  . Referred for management of medication therapy 03/08/2013  . Asthma exacerbation, non-allergic 03/08/2013  . Sore throat 03/08/2013  . Unspecified sleep apnea 03/08/2013  . Pain in joint, ankle and foot 11/25/2012  . Hyperbilirubinemia 01/10/2012  . Ventral hernia without obstruction or gangrene 11/12/2011  . Obesity 11/12/2011  . Abdominal wall pain 03/06/2011  . NAUSEA WITH VOMITING 05/15/2009  . WEIGHT GAIN, ABNORMAL 03/21/2009  . EPIGASTRIC PAIN, CHRONIC 03/21/2009  . 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 SMOKER 03/20/2009  . DEPRESSION 03/20/2009  . HYPERTENSION 03/20/2009  . GERD 03/20/2009  . HEMATEMESIS 03/20/2009  . SHOULDER PAIN, RIGHT  03/20/2009  . ANOREXIA 03/20/2009  . ABDOMINAL PAIN, GENERALIZED 03/20/2009  . CANNABIS ABUSE, HX OF 03/20/2009    Past Surgical History:  Procedure Laterality Date  . APPENDECTOMY  5/10  . BIOPSY  08/15/2015   Procedure: BIOPSY;  Surgeon: West Bali, MD;  Location: [facility] ENDO SUITE;  Service: Endoscopy;;  gastric bx  . CHOLECYSTECTOMY  10/10  . ESOPHAGOGASTRODUODENOSCOPY  05/2006   H.pylori  neg  . ESOPHAGOGASTRODUODENOSCOPY  03/23/2008   Fields-gastritis benign esophageal polyp, otherwise normal. Negative H. pylori he (propofol)  . ESOPHAGOGASTRODUODENOSCOPY (EGD) WITH PROPOFOL N/A 08/15/2015   Procedure: ESOPHAGOGASTRODUODENOSCOPY (EGD) WITH PROPOFOL;  Surgeon: West Bali, MD;  Location: [facility] ENDO SUITE;  Service: Endoscopy;  Laterality: N/A;  4098  . INCISIONAL HERNIA REPAIR  03/04/2012   Procedure: LAPAROSCOPIC INCISIONAL HERNIA;  Surgeon: Dalia Heading, MD;  Location: [facility] ORS;  Service: General;  Laterality: N/A;  repair, recurrent  . INCISIONAL HERNIA REPAIR N/A 01/12/2014   Procedure: HERNIA REPAIR INCISIONAL WITH MESH;  Surgeon: Dalia Heading, MD;  Location: [facility] ORS;  Service: General;  Laterality: N/A;  . INGUINAL HERNIA REPAIR     child  . INSERTION OF MESH  03/04/2012   Procedure: INSERTION OF MESH;  Surgeon: Dalia Heading, MD;  Location: [facility] ORS;  Service: General;  Laterality: N/A;  . INSERTION OF MESH N/A 01/12/2014   Procedure: INSERTION OF MESH;  Surgeon: Dalia Heading, MD;  Location: [facility] ORS;  Service: General;  Laterality: N/A;  . KNEE SURGERY Right 2009  . LEFT HEART CATH AND CORONARY ANGIOGRAPHY N/A 06/12/2016   Procedure: Left Heart Cath and Coronary Angiography;  Surgeon: Corky Crafts, MD;  Location: [facility] INVASIVE CV LAB;  Service: Cardiovascular;  Laterality: N/A;  . VENTRAL HERNIA REPAIR  March 2012   Dr. Lovell Sheehan       Home Medications    Prior to Admission medications   Medication Sig Start Date End Date Taking? Authorizing Provider  albuterol (PROVENTIL HFA;VENTOLIN HFA) 108 (90 Base) MCG/ACT inhaler Inhale 2 puffs into the lungs every 6 (six) hours as needed for wheezing or shortness of breath.   Yes [provider]  ALPRAZolam Prudy Feeler) 1 MG tablet Take 1 mg by mouth 5 (five) times daily.    Yes [provider]  budesonide-formoterol (SYMBICORT) 160-4.5 MCG/ACT inhaler Inhale 2 puffs into the lungs 2 (two) times daily.   Yes  [provider]  Cariprazine HCl (VRAYLAR) 3 MG CAPS Take 3 mg by mouth daily.   Yes [provider]  fluticasone (FLONASE) 50 MCG/ACT nasal spray Place 2 sprays into both nostrils daily as needed for allergies.    Yes [provider]  ibuprofen (ADVIL,MOTRIN) 600 MG tablet Take 1 tablet (600 mg total) by mouth 4 (four) times daily. Patient taking differently: Take 800 mg by mouth every 6 (six) hours as needed.  12/18/16  Yes Ivery Quale, PA-C  loratadine (CLARITIN) 10 MG tablet Take 10 mg by mouth daily as needed for allergies.   Yes [provider]  omeprazole (PRILOSEC) 40 MG capsule Take 40 mg by mouth 2 (two) times daily.   Yes [provider]  rizatriptan (MAXALT) 10 MG tablet Take 10 mg by mouth as needed for migraine. May repeat in 2 hours if needed   Yes [provider]  vortioxetine HBr (TRINTELLIX) 10 MG TABS Take 10 mg by mouth daily.   Yes [provider]  nitroGLYCERIN (NITROSTAT) 0.4 MG SL tablet Place 1 tablet (  0.4 mg total) under the tongue every 5 (five) minutes as needed for chest pain. 05/30/16 10/13/16  Laqueta Linden, MD  predniSONE (DELTASONE) 20 MG tablet 3 tabs po day one, then 2 tabs daily x 4 days 05/14/17   Loren Racer, MD    Family History Family History  Problem Relation Age of Onset  . Diabetes Mother   . CAD Mother   . CAD Father   . Colon cancer Neg Hx     Social History Social History   Tobacco Use  . Smoking status: Current Every Day Smoker    Packs/day: 1.00    Years: 15.00    Pack years: 15.00    Types: Cigarettes  . Smokeless tobacco: Never Used  . Tobacco comment: Less than 1/2 pk a day. trying to quit.  03/04/12 1255 states "was doing good, but now up to about a pack per day"  Substance Use Topics  . Alcohol use: No  . Drug use: No     Allergies   Buprenorphine; Dexilant [dexlansoprazole]; Propoxyphene n-acetaminophen; and Zofran [ondansetron hcl]   Review of  Systems Review of Systems  Constitutional: Positive for diaphoresis. Negative for chills and fever.  HENT: Negative for congestion.   Eyes: Negative for visual disturbance.  Respiratory: Positive for chest tightness, shortness of breath and wheezing. Negative for cough.   Cardiovascular: Positive for chest pain. Negative for palpitations and leg swelling.  Gastrointestinal: Negative for abdominal pain, constipation, diarrhea, nausea and vomiting.  Genitourinary: Negative for dysuria, flank pain, frequency and hematuria.  Musculoskeletal: Negative for back pain and myalgias.  Skin: Negative for rash and wound.  Neurological: Negative for dizziness, weakness, light-headedness, numbness and headaches.  All other systems reviewed and are negative.    Physical Exam Updated Vital Signs BP 108/71   Pulse 93   Temp 98.1 F (36.7 C) (Oral)   Ht 5\' 9"  (1.753 m)   Wt 131.5 kg (290 lb)   SpO2 95%   BMI 42.83 kg/m   Physical Exam  Constitutional: He is oriented to person, place, and time. He appears well-developed and well-nourished.  HENT:  Head: Normocephalic and atraumatic.  Mouth/Throat: Oropharynx is clear and moist. No oropharyngeal exudate.  Eyes: EOM are normal. Pupils are equal, round, and reactive to light.  Neck: Normal range of motion. Neck supple.  Cardiovascular: Regular rhythm.  Mild tachycardia  Pulmonary/Chest: Effort normal. He has wheezes. He exhibits tenderness.  Diminished air movement with scattered expiratory wheezes.  Chest pain is reproduced with palpation of the left chest wall.  No crepitance or deformity.  Abdominal: Soft. Bowel sounds are normal. There is no tenderness. There is no rebound and no guarding.  Musculoskeletal: Normal range of motion. He exhibits no edema or tenderness.  No lower extremity swelling, asymmetry or tenderness.  Lymphadenopathy:    He has no cervical adenopathy.  Neurological: He is alert and oriented to person, place, and time.    Moves all extremities without focal deficit.  Sensation intact.  Skin: Skin is warm. Capillary refill takes less than 2 seconds. No rash noted. He is diaphoretic. No erythema.  Psychiatric:  Anxious appearing  Nursing note and vitals reviewed.    ED Treatments / Results  Labs (all labs ordered are listed, but only abnormal results are displayed) Labs Reviewed  BASIC METABOLIC PANEL - Abnormal; Notable for the following components:      Result Value   Chloride 100 (*)    Glucose, Bld 125 (*)  All other components within normal limits  CBC WITH DIFFERENTIAL/PLATELET  TROPONIN I  D-DIMER, QUANTITATIVE (NOT AT [facility])  TROPONIN I    EKG  EKG Interpretation  Date/Time:  Tuesday May 13 2017 18:45:59 EST Ventricular Rate:  102 PR Interval:  156 QRS Duration: 84 QT Interval:  336 QTC Calculation: 437 R Axis:   80 Text Interpretation:  Sinus tachycardia Nonspecific T wave abnormality Abnormal ECG Confirmed by Loren Racer ([id number]) on 05/13/2017 7:17:26 PM       Radiology Dg Chest 2 View  Result Date: 05/13/2017 CLINICAL DATA:  Chest pain for over an hour with dyspnea. EXAM: CHEST  2 VIEW COMPARISON:  None. FINDINGS: Lordotic AP upright view. The heart size and mediastinal contours are within normal limits. Both lungs are clear. Slightly low lung volumes. The visualized skeletal structures are unremarkable. IMPRESSION: No active cardiopulmonary disease. Electronically Signed   By: Tollie Eth M.D.   On: 05/13/2017 19:48    Procedures Procedures (including critical care time)  Medications Ordered in ED Medications  albuterol (PROVENTIL,VENTOLIN) solution continuous neb (10 mg/hr Nebulization New Bag/Given 05/13/17 2208)  acetaminophen (TYLENOL) tablet 650 mg (650 mg Oral Given 05/13/17 2145)  albuterol (PROVENTIL) (2.5 MG/3ML) 0.083% nebulizer solution 5 mg (5 mg Nebulization Given 05/13/17 1948)  methylPREDNISolone sodium succinate (SOLU-MEDROL) 125 mg/2 mL injection 125 mg  (125 mg Intravenous Given 05/13/17 2107)  LORazepam (ATIVAN) injection 1 mg (1 mg Intravenous Given 05/13/17 2107)  gi cocktail (Maalox,Lidocaine,Donnatal) (30 mLs Oral Given 05/13/17 2145)     Initial Impression / Assessment and Plan / ED Course  I have reviewed the triage vital signs and the nursing notes.  Pertinent labs & imaging results that were available during my care of the patient were reviewed by me and considered in my medical decision making (see chart for details).    Patient is well-appearing.  Improved air movement after breathing treatment.  Negative troponin x2.  Chest pain likely secondary to bronchospasm/anxiety.  Negative d-dimer.  Advised to follow-up closely with his primary physician.  Return precautions given.   Final Clinical Impressions(s) / ED Diagnoses   Final diagnoses:  Nonspecific chest pain  Bronchospasm    ED Discharge Orders        Ordered    predniSONE (DELTASONE) 20 MG tablet     05/13/17 2320       Loren Racer, MD 05/13/17 2321

## 2017-05-13 NOTE — ED Triage Notes (Signed)
Pt with cp for almost an hour ago, been going off and on.  + SOB and diaphoresis at home, denies N/V
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
## 2017-11-03 ENCOUNTER — Other Ambulatory Visit: Payer: Self-pay

## 2017-11-03 ENCOUNTER — Emergency Department (HOSPITAL_COMMUNITY)
Admission: EM | Admit: 2017-11-03 | Discharge: 2017-11-03 | Disposition: A | Discharge status: Home / Self Care | Payer: Medicaid Other | Attending: Emergency Medicine | Admitting: Emergency Medicine

## 2017-11-03 ENCOUNTER — Encounter (HOSPITAL_COMMUNITY): Payer: Self-pay | Admitting: Emergency Medicine

## 2017-11-03 DIAGNOSIS — R1010 Upper abdominal pain, unspecified: Secondary | ICD-10-CM | POA: Diagnosis present

## 2017-11-03 DIAGNOSIS — F3181 Bipolar II disorder: Secondary | ICD-10-CM | POA: Diagnosis not present

## 2017-11-03 DIAGNOSIS — R112 Nausea with vomiting, unspecified: Secondary | ICD-10-CM | POA: Diagnosis not present

## 2017-11-03 DIAGNOSIS — R17 Unspecified jaundice: Secondary | ICD-10-CM

## 2017-11-03 DIAGNOSIS — Z9049 Acquired absence of other specified parts of digestive tract: Secondary | ICD-10-CM | POA: Insufficient documentation

## 2017-11-03 DIAGNOSIS — F419 Anxiety disorder, unspecified: Secondary | ICD-10-CM | POA: Insufficient documentation

## 2017-11-03 DIAGNOSIS — I10 Essential (primary) hypertension: Secondary | ICD-10-CM | POA: Diagnosis not present

## 2017-11-03 DIAGNOSIS — Z79899 Other long term (current) drug therapy: Secondary | ICD-10-CM | POA: Diagnosis not present

## 2017-11-03 DIAGNOSIS — J449 Chronic obstructive pulmonary disease, unspecified: Secondary | ICD-10-CM | POA: Diagnosis not present

## 2017-11-03 DIAGNOSIS — F17210 Nicotine dependence, cigarettes, uncomplicated: Secondary | ICD-10-CM | POA: Insufficient documentation

## 2017-11-03 LAB — CBC WITH DIFFERENTIAL/PLATELET
Basophils Absolute: 0 10*3/uL (ref 0.0–0.1)
Basophils Relative: 0 %
EOS ABS: 0.2 10*3/uL (ref 0.0–0.7)
Eosinophils Relative: 2 %
HEMATOCRIT: 50 % (ref 39.0–52.0)
HEMOGLOBIN: 16.9 g/dL (ref 13.0–17.0)
LYMPHS ABS: 2.2 10*3/uL (ref 0.7–4.0)
Lymphocytes Relative: 22 %
MCH: 32.3 pg (ref 26.0–34.0)
MCHC: 33.8 g/dL (ref 30.0–36.0)
MCV: 95.6 fL (ref 78.0–100.0)
MONO ABS: 0.7 10*3/uL (ref 0.1–1.0)
MONOS PCT: 7 %
NEUTROS PCT: 69 %
Neutro Abs: 7.1 10*3/uL (ref 1.7–7.7)
Platelets: 231 10*3/uL (ref 150–400)
RBC: 5.23 MIL/uL (ref 4.22–5.81)
RDW: 13.1 % (ref 11.5–15.5)
WBC: 10.3 10*3/uL (ref 4.0–10.5)

## 2017-11-03 LAB — HEPATIC FUNCTION PANEL
ALK PHOS: 79 U/L (ref 38–126)
ALT: 25 U/L (ref 0–44)
AST: 19 U/L (ref 15–41)
Albumin: 4.7 g/dL (ref 3.5–5.0)
BILIRUBIN INDIRECT: 2.2 mg/dL — AB (ref 0.3–0.9)
BILIRUBIN TOTAL: 2.4 mg/dL — AB (ref 0.3–1.2)
Bilirubin, Direct: 0.2 mg/dL (ref 0.0–0.2)
Total Protein: 8.1 g/dL (ref 6.5–8.1)

## 2017-11-03 LAB — BASIC METABOLIC PANEL
Anion gap: 9 (ref 5–15)
BUN: 13 mg/dL (ref 6–20)
CALCIUM: 9.3 mg/dL (ref 8.9–10.3)
CHLORIDE: 103 mmol/L (ref 98–111)
CO2: 27 mmol/L (ref 22–32)
CREATININE: 1.11 mg/dL (ref 0.61–1.24)
GFR calc Af Amer: >60 mL/min (ref >60)
GFR calc non Af Amer: >60 mL/min (ref >60)
Glucose, Bld: 101 mg/dL — ABNORMAL HIGH (ref 70–99)
Potassium: 3.9 mmol/L (ref 3.5–5.1)
Sodium: 139 mmol/L (ref 135–145)

## 2017-11-03 LAB — LIPASE, BLOOD: LIPASE: 23 U/L (ref 11–51)

## 2017-11-03 LAB — TROPONIN I: Troponin I: <0.03 ng/mL (ref <0.03)

## 2017-11-03 MED ORDER — FAMOTIDINE IN NACL 20-0.9 MG/50ML-% IV SOLN
20.0000 mg | Freq: Two times a day (BID) | INTRAVENOUS | Status: DC
  Administered 2017-11-03: 20 mg via INTRAVENOUS
  Filled 2017-11-03: qty 50

## 2017-11-03 MED ORDER — GI COCKTAIL ~~LOC~~
30.0000 mL | Freq: Once | ORAL | Status: AC
  Administered 2017-11-03: 30 mL via ORAL
  Filled 2017-11-03: qty 30

## 2017-11-03 MED ORDER — PROMETHAZINE HCL 25 MG/ML IJ SOLN
25.0000 mg | Freq: Once | INTRAMUSCULAR | Status: AC
  Administered 2017-11-03: 25 mg via INTRAVENOUS
  Filled 2017-11-03: qty 1

## 2017-11-03 MED ORDER — PROMETHAZINE HCL 25 MG PO TABS: 25.0000 mg | ORAL_TABLET | Freq: Four times a day (QID) | ORAL | 0 refills | Status: DC | PRN
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
## 2017-11-03 NOTE — ED Triage Notes (Signed)
Pt c/o nausea and dry heaving Saturday and now had upper mid abd pain. Pt states he thinks he irritated his hernia.

## 2017-11-03 NOTE — Discharge Instructions (Signed)
Recheck with your doctor in 1-2 days Have your bilirubin rechecked Drink plenty of clear liquids

## 2017-11-05 ENCOUNTER — Encounter: Payer: Self-pay | Admitting: Gastroenterology

## 2018-01-21 ENCOUNTER — Telehealth (HOSPITAL_COMMUNITY): Payer: Self-pay | Admitting: Physical Therapy

## 2018-01-21 NOTE — Telephone Encounter (Signed)
Pt came to office signed Release of Info and Records were faxed to Donnie Aho at [phone] [facility] Phone#[phone]. NF Gave copies to patient after confirmation of fax was received. NF 01/21/2018

## 2018-01-29 ENCOUNTER — Telehealth (HOSPITAL_COMMUNITY): Payer: Self-pay | Admitting: Physical Therapy

## 2018-01-29 NOTE — Telephone Encounter (Signed)
She called looking for medical bills, I advised her to call Patient Accounting at [phone]. NF 01/29/2018

## 2018-02-04 ENCOUNTER — Ambulatory Visit: Payer: Medicaid Other | Admitting: Nurse Practitioner

## 2018-02-04 ENCOUNTER — Encounter

## 2018-02-04 ENCOUNTER — Encounter: Payer: Self-pay | Admitting: Nurse Practitioner

## 2018-02-04 DIAGNOSIS — R0789 Other chest pain: Secondary | ICD-10-CM | POA: Diagnosis not present

## 2018-02-04 DIAGNOSIS — K219 Gastro-esophageal reflux disease without esophagitis: Secondary | ICD-10-CM | POA: Diagnosis not present

## 2018-02-04 NOTE — Patient Instructions (Signed)
1. Have your labs drawn when you are able to. 2. Continue your current medications. 3. We will send you down to [facility] for surgical consult to look at your hernia. 4. We will refer you to bariatric surgery again.  If you do not hear from them, call them. 5. We will call the cardiologist and asked him to contact you to schedule follow-up appointment. 6. Return for follow-up in 3 months. 7. Call us if you have any questions or concerns.  At [facility] we value your feedback. You may receive a survey about your visit today. Please share your experience as we strive to create trusting relationships with our patients to provide genuine, compassionate, quality care.  We appreciate your understanding and patience as we review any laboratory studies, imaging, and other diagnostic tests that are ordered as we care for you. Our office policy is 5 business days for review of these results, and any emergent or urgent results are addressed in a timely manner for your best interest. If you do not hear from our office in 1 week, please contact us.   We also encourage the use of MyChart, which contains your medical information for your review as well. If you are not enrolled in this feature, an access code is on this after visit summary for your convenience. Thank you for allowing [place] to be involved in your care.  It was great to meet you today!  I hope you have a great Fall!!
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
## 2018-02-06 NOTE — Assessment & Plan Note (Signed)
Atypical chest pain previously evaluated with a cardiac cath about a year ago.  Significant family history of heart problems, per the patient.  He has intermittent chest discomfort about 3 times a day which lasts 10 to 15 minutes and some residual pressure.  The symptoms tend to occur with exertion.  No association with eating.  He is morbidly obese and recommended follow-up with bariatrics for possible reevaluation.  He does have a small abdominal hernia and his pain is reproduced when he flexes his stomach which results in more pronounced hernia.  However, it is soft and generally nontender on exam.  He does seem to want his hernia repaired.  We will refer him to [facility] surgery in [place] for evaluation of possible hernia repair.  Follow-up in 3 months.

## 2018-02-06 NOTE — Assessment & Plan Note (Signed)
Noted elevated bilirubin of 2.4 with a normal direct bilirubin is 0.2 and an elevated indirect bilirubin of 2.2.  Lipase normal, BMP normal, CBC normal.  Known history of transaminitis with a recent work-up including normal iron, ferritin, autoimmune serologies, negative for PBC, Wilson's disease, alpha-1 antitrypsin level was 81 and essentially very minimally low.  Alpha-1 antitrypsin mutation analysis with 1 copy of the allele, indicating he is a likely carrier.  Given his pattern of bilirubin elevation I will recheck an HFP as well as haptoglobin and reticulocyte count for possible hemolysis.  Other differentials include Gilbert's syndrome, although he has not had episodic jaundice.  Pending results we will plan for further work-up.  Follow-up in 3 months.  Further recommendations to follow.

## 2018-02-06 NOTE — Assessment & Plan Note (Signed)
History of GERD currently on omeprazole 40 mg twice daily.  Chest pain not associated with eating and drinking.  Denies dysphasia or odynophagia.  Denies hematochezia or melena.  Known history of hiatal hernia, although this was found to be small.  States his chest discomfort does not feel like his typical GERD symptoms.  Recommend he continue his current medications and follow-up in 3 months.

## 2018-02-09 ENCOUNTER — Telehealth: Payer: Self-pay

## 2018-02-09 ENCOUNTER — Other Ambulatory Visit: Payer: Self-pay

## 2018-02-09 DIAGNOSIS — K449 Diaphragmatic hernia without obstruction or gangrene: Secondary | ICD-10-CM

## 2018-02-09 DIAGNOSIS — E669 Obesity, unspecified: Secondary | ICD-10-CM

## 2018-02-09 NOTE — Progress Notes (Signed)
CC'D TO PCP °

## 2018-02-09 NOTE — Telephone Encounter (Signed)
Referral faxed to [facility] for ?hernia repair.  Referral faxed to [facility] for ?bariatric surgery.

## 2018-02-11 ENCOUNTER — Emergency Department (HOSPITAL_COMMUNITY): Payer: Medicaid Other

## 2018-02-11 ENCOUNTER — Emergency Department (HOSPITAL_COMMUNITY)
Admission: EM | Admit: 2018-02-11 | Discharge: 2018-02-11 | Disposition: A | Discharge status: Home / Self Care | Payer: Medicaid Other | Attending: Emergency Medicine | Admitting: Emergency Medicine

## 2018-02-11 ENCOUNTER — Encounter (HOSPITAL_COMMUNITY): Payer: Self-pay | Admitting: Emergency Medicine

## 2018-02-11 ENCOUNTER — Other Ambulatory Visit: Payer: Self-pay

## 2018-02-11 DIAGNOSIS — J449 Chronic obstructive pulmonary disease, unspecified: Secondary | ICD-10-CM | POA: Diagnosis not present

## 2018-02-11 DIAGNOSIS — Z79899 Other long term (current) drug therapy: Secondary | ICD-10-CM | POA: Diagnosis not present

## 2018-02-11 DIAGNOSIS — I10 Essential (primary) hypertension: Secondary | ICD-10-CM | POA: Diagnosis not present

## 2018-02-11 DIAGNOSIS — F17210 Nicotine dependence, cigarettes, uncomplicated: Secondary | ICD-10-CM | POA: Insufficient documentation

## 2018-02-11 DIAGNOSIS — R0789 Other chest pain: Secondary | ICD-10-CM | POA: Insufficient documentation

## 2018-02-11 LAB — CBC
HEMATOCRIT: 44.3 % (ref 39.0–52.0)
HEMOGLOBIN: 14.5 g/dL (ref 13.0–17.0)
MCH: 30.4 pg (ref 26.0–34.0)
MCHC: 32.7 g/dL (ref 30.0–36.0)
MCV: 92.9 fL (ref 80.0–100.0)
Platelets: 199 10*3/uL (ref 150–400)
RBC: 4.77 MIL/uL (ref 4.22–5.81)
RDW: 12.5 % (ref 11.5–15.5)
WBC: 8.8 10*3/uL (ref 4.0–10.5)
nRBC: 0 % (ref 0.0–0.2)

## 2018-02-11 LAB — BASIC METABOLIC PANEL
Anion gap: 9 (ref 5–15)
BUN: 11 mg/dL (ref 6–20)
CHLORIDE: 106 mmol/L (ref 98–111)
CO2: 26 mmol/L (ref 22–32)
CREATININE: 1.04 mg/dL (ref 0.61–1.24)
Calcium: 9 mg/dL (ref 8.9–10.3)
GFR calc Af Amer: >60 mL/min (ref >60)
GFR calc non Af Amer: >60 mL/min (ref >60)
Glucose, Bld: 114 mg/dL — ABNORMAL HIGH (ref 70–99)
Potassium: 3.3 mmol/L — ABNORMAL LOW (ref 3.5–5.1)
SODIUM: 141 mmol/L (ref 135–145)

## 2018-02-11 LAB — TROPONIN I: Troponin I: <0.03 ng/mL (ref <0.03)

## 2018-02-11 MED ORDER — METHOCARBAMOL 500 MG PO TABS: 1000.0000 mg | ORAL_TABLET | Freq: Four times a day (QID) | ORAL | 0 refills | Status: DC | PRN

## 2018-02-11 MED ORDER — METHOCARBAMOL 500 MG PO TABS
1000.0000 mg | ORAL_TABLET | Freq: Once | ORAL | Status: AC
  Administered 2018-02-11: 1000 mg via ORAL
  Filled 2018-02-11: qty 2

## 2018-02-11 NOTE — ED Triage Notes (Signed)
Patient complaining of left sided chest pain radiating into left arm starting 15 minutes prior to arrival.

## 2018-02-11 NOTE — ED Provider Notes (Signed)
[facility] EMERGENCY DEPARTMENT Provider Note   CSN: [id number] Arrival date & time: 02/11/18  1706     History   Chief Complaint Chief Complaint  Patient presents with  . Chest Pain    HPI KARA MIERZEJEWSKI is a 37 y.o. male.  HPI Patient presents with left-sided chest pain that started around 4:30 this afternoon while he was taking a nap.  States the pain is been on and off since onset.  Currently denying chest pain.  Denies any known exacerbating factors.  No shortness of breath or cough.  No nausea or vomiting.  No new lower extremity swelling or pain.  Patient states he does have a family history of coronary artery disease and smokes cigarettes.  Had a cardiac catheterization 2 years ago with normal coronary arteries. Past Medical History:  Diagnosis Date  . Alpha-1-antitrypsin deficiency carrier    labs 05/2016 with documented MZ allele  . Anxiety   . Bipolar 2 disorder (HCC)   . Chronic abdominal pain    HIDA scan Sept 2010, EF 93%, s/p chole oct 2010; evaluated at [facility] for abdominal wall pain; Imipramine added May 2011  . Chronic vomiting    normal GES 2009  . Depression    increased over past several months  . GERD (gastroesophageal reflux disease)    Bravo study Nov 2009 on Prilosec 40 BID with adequate acid suppression  . Hiatal hernia   . Hypertension   . S/P endoscopy    2008 Dr. Karilyn Cota: erosive reflux esophagitis, antral gastritis, H.pylori serologies neg, Nov 2009 Dr. Darrick Penna: gastritis, benign esophageal polyp, no H.pylori,  Feb 2011: [facility], Dr. Bubba Hales: normal esophagus, normal stomach, normal duodenum, path unremarkable  . Shortness of breath    SOB even with no exertion  . Skull fracture (HCC)   . Sleep apnea    supposed to sleep with CPAP  . Ventral hernia     Patient Active Problem List   Diagnosis Date Noted  . Hiatal hernia 06/14/2016  . Alpha-1-antitrypsin deficiency carrier 06/14/2016  . Atypical chest pain 06/14/2016  . Abnormal nuclear  stress test   . Intracranial hemorrhage (HCC) 01/20/2016  . Nausea without vomiting 12/15/2015  . Early satiety 07/25/2015  . Gastroesophageal reflux disease with esophagitis 06/27/2015  . Nausea vomiting and diarrhea 04/27/2015  . Transaminitis 04/27/2015  . Cough 06/23/2013  . Tobacco abuse counseling 06/23/2013  . Abdominal cramping 05/05/2013  . COPD (chronic obstructive pulmonary disease) with acute bronchitis (HCC) 05/05/2013  . Syncope 05/05/2013  . Depression with anxiety 05/05/2013  . Hyperglycemia 05/05/2013  . Black-out (not amnesia) 05/05/2013  . Smoking 04/16/2013  . Blackout spell 04/16/2013  . Hx of bipolar disorder 04/16/2013  . GERD (gastroesophageal reflux disease) 04/16/2013  . OSA (obstructive sleep apnea) 04/16/2013  . Dyspnea 04/16/2013  . Bipolar 2 disorder (HCC) 03/08/2013  . HTN (hypertension) 03/08/2013  . Tobacco abuse 03/08/2013  . Referred for management of medication therapy 03/08/2013  . Asthma exacerbation, non-allergic 03/08/2013  . Sore throat 03/08/2013  . Unspecified sleep apnea 03/08/2013  . Pain in joint, ankle and foot 11/25/2012  . Hyperbilirubinemia 01/10/2012  . Ventral hernia without obstruction or gangrene 11/12/2011  . Obesity 11/12/2011  . Abdominal wall pain 03/06/2011  . NAUSEA WITH VOMITING 05/15/2009  . WEIGHT GAIN, ABNORMAL 03/21/2009  . EPIGASTRIC PAIN, CHRONIC 03/21/2009  . 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 SMOKER 03/20/2009  . DEPRESSION 03/20/2009  . HYPERTENSION 03/20/2009  . GERD 03/20/2009  . HEMATEMESIS 03/20/2009  .  SHOULDER PAIN, RIGHT 03/20/2009  . ANOREXIA 03/20/2009  . ABDOMINAL PAIN, GENERALIZED 03/20/2009  . CANNABIS ABUSE, HX OF 03/20/2009    Past Surgical History:  Procedure Laterality Date  . APPENDECTOMY  5/10  . BIOPSY  08/15/2015   Procedure: BIOPSY;  Surgeon: West Bali, MD;  Location: [facility];  Service: Endoscopy;;  gastric bx  . CHOLECYSTECTOMY  10/10  . ESOPHAGOGASTRODUODENOSCOPY  05/2006    H.pylori neg  . ESOPHAGOGASTRODUODENOSCOPY  03/23/2008   Fields-gastritis benign esophageal polyp, otherwise normal. Negative H. pylori he (propofol)  . ESOPHAGOGASTRODUODENOSCOPY (EGD) WITH PROPOFOL N/A 08/15/2015   Procedure: ESOPHAGOGASTRODUODENOSCOPY (EGD) WITH PROPOFOL;  Surgeon: West Bali, MD;  Location: [facility];  Service: Endoscopy;  Laterality: N/A;  1308  . INCISIONAL HERNIA REPAIR  03/04/2012   Procedure: LAPAROSCOPIC INCISIONAL HERNIA;  Surgeon: Dalia Heading, MD;  Location: [facility] ORS;  Service: General;  Laterality: N/A;  repair, recurrent  . INCISIONAL HERNIA REPAIR N/A 01/12/2014   Procedure: HERNIA REPAIR INCISIONAL WITH MESH;  Surgeon: Dalia Heading, MD;  Location: [facility];  Service: General;  Laterality: N/A;  . INGUINAL HERNIA REPAIR     child  . INSERTION OF MESH  03/04/2012   Procedure: INSERTION OF MESH;  Surgeon: Dalia Heading, MD;  Location: [facility];  Service: General;  Laterality: N/A;  . INSERTION OF MESH N/A 01/12/2014   Procedure: INSERTION OF MESH;  Surgeon: Dalia Heading, MD;  Location: [facility];  Service: General;  Laterality: N/A;  . KNEE SURGERY Right 2009  . LEFT HEART CATH AND CORONARY ANGIOGRAPHY N/A 06/12/2016   Procedure: Left Heart Cath and Coronary Angiography;  Surgeon: Corky Crafts, MD;  Location: [facility] INVASIVE CV LAB;  Service: Cardiovascular;  Laterality: N/A;  . VENTRAL HERNIA REPAIR  March 2012   Dr. Lovell Sheehan        Home Medications    Prior to Admission medications   Medication Sig Start Date End Date Taking? Authorizing Provider  albuterol (PROVENTIL HFA;VENTOLIN HFA) 108 (90 Base) MCG/ACT inhaler Inhale 2 puffs into the lungs every 6 (six) hours as needed for wheezing or shortness of breath.    [provider]  ALPRAZolam Prudy Feeler) 1 MG tablet Take 2 mg by mouth 3 (three) times daily as needed.     [provider]  fluticasone (FLONASE) 50 MCG/ACT nasal spray Place 2 sprays into both nostrils daily as needed for  allergies.     [provider]  ibuprofen (ADVIL,MOTRIN) 600 MG tablet Take 1 tablet (600 mg total) by mouth 4 (four) times daily. Patient taking differently: Take 800 mg by mouth every 6 (six) hours as needed.  12/18/16   Ivery Quale, PA-C  loratadine (CLARITIN) 10 MG tablet Take 10 mg by mouth daily as needed for allergies.    [provider]  nitroGLYCERIN (NITROSTAT) 0.4 MG SL tablet Place 1 tablet (0.4 mg total) under the tongue every 5 (five) minutes as needed for chest pain. 05/30/16 10/13/16  Laqueta Linden, MD  omeprazole (PRILOSEC) 40 MG capsule Take 40 mg by mouth 2 (two) times daily.    [provider]    Family History Family History  Problem Relation Age of Onset  . Diabetes Mother   . CAD Mother   . CAD Father   . Colon cancer Neg Hx     Social History Social History   Tobacco Use  . Smoking status: Current Every Day Smoker    Packs/day: 1.00  Years: 15.00    Pack years: 15.00    Types: Cigarettes  . Smokeless tobacco: Never Used  . Tobacco comment: Less than 1/2 pk a day. trying to quit.  03/04/12 1255 states "was doing good, but now up to about a pack per day"  Substance Use Topics  . Alcohol use: No  . Drug use: No     Allergies   Buprenorphine; Dexilant [dexlansoprazole]; Propoxyphene n-acetaminophen; and Zofran [ondansetron hcl]   Review of Systems Review of Systems  Constitutional: Negative for chills and fever.  HENT: Negative for sore throat and trouble swallowing.   Respiratory: Negative for cough and shortness of breath.   Cardiovascular: Positive for chest pain. Negative for palpitations and leg swelling.  Gastrointestinal: Negative for abdominal pain, constipation, diarrhea, nausea and vomiting.  Musculoskeletal: Negative for back pain, myalgias and neck pain.  Skin: Negative for rash and wound.  Neurological: Negative for dizziness, weakness, light-headedness, numbness and headaches.  All other systems  reviewed and are negative.    Physical Exam Updated Vital Signs BP (!) 148/88 (BP Location: Right Arm)   Pulse 84   Temp 97.9 F (36.6 C) (Oral)   Resp 20   Ht 5\' 9"  (1.753 m)   SpO2 98%   BMI 48.23 kg/m   Physical Exam  Constitutional: He is oriented to person, place, and time. He appears well-developed and well-nourished. No distress.  HENT:  Head: Normocephalic and atraumatic.  Mouth/Throat: Oropharynx is clear and moist. No oropharyngeal exudate.  Eyes: Pupils are equal, round, and reactive to light. EOM are normal.  Neck: Normal range of motion. Neck supple. No JVD present.  Cardiovascular: Normal rate and regular rhythm. Exam reveals no gallop and no friction rub.  No murmur heard. Pulmonary/Chest: Effort normal. No stridor. No respiratory distress. He has wheezes. He has no rales. He exhibits tenderness.  Scattered expiratory wheezes especially in the right lung field.  Left chest wall with tenderness to palpation.  No crepitance or deformity.  Abdominal: Soft. Bowel sounds are normal. There is no tenderness. There is no rebound and no guarding.  Musculoskeletal: Normal range of motion. He exhibits no edema or tenderness.  No lower extremity swelling, asymmetry or tenderness.  Distal pulses intact.  No midline thoracic or lumbar tenderness.  Lymphadenopathy:    He has no cervical adenopathy.  Neurological: He is alert and oriented to person, place, and time.  Moving all extremities without focal deficit.  Sensation intact.  Skin: Skin is warm and dry. Capillary refill takes less than 2 seconds. No rash noted. He is not diaphoretic. No erythema.  Psychiatric: He has a normal mood and affect. His behavior is normal.  Nursing note and vitals reviewed.    ED Treatments / Results  Labs (all labs ordered are listed, but only abnormal results are displayed) Labs Reviewed  BASIC METABOLIC PANEL  CBC  TROPONIN I    EKG EKG Interpretation  Date/Time:  Wednesday  February 11 2018 17:15:04 EDT Ventricular Rate:  87 PR Interval:  156 QRS Duration: 92 QT Interval:  378 QTC Calculation: 454 R Axis:   79 Text Interpretation:  Normal sinus rhythm Nonspecific T wave abnormality Abnormal ECG No significant change since last tracing Confirmed by Loren Racer ([id number]) on 02/11/2018 7:30:11 PM   Radiology Dg Chest 2 View  Result Date: 02/11/2018 CLINICAL DATA:  Left-sided chest pain for several hours, initial encounter EXAM: CHEST - 2 VIEW COMPARISON:  05/13/17 FINDINGS: Cardiac shadow is within normal limits. The lungs  are well aerated bilaterally. No focal infiltrate or sizable effusion is seen. No bony abnormality is noted. IMPRESSION: No acute abnormality seen. Electronically Signed   By: Alcide Clever M.D.   On: 02/11/2018 18:16    Procedures Procedures (including critical care time)  Medications Ordered in ED Medications - No data to display   Initial Impression / Assessment and Plan / ED Course  I have reviewed the triage vital signs and the nursing notes.  Pertinent labs & imaging results that were available during my care of the patient were reviewed by me and considered in my medical decision making (see chart for details).     Chest pain is completely reproduced with palpation.  EKG and troponin is normal.  X-ray without acute abnormality.  Patient with recent cardiac catheterization within the last year and a half with no coronary artery disease.  Will treat for symptomatic chest wall pain.  Return precautions given.  Final Clinical Impressions(s) / ED Diagnoses   Final diagnoses:  None    ED Discharge Orders    None       Loren Racer, MD 02/12/18 1621

## 2018-02-12 ENCOUNTER — Other Ambulatory Visit: Payer: Self-pay

## 2018-02-12 ENCOUNTER — Encounter (HOSPITAL_COMMUNITY): Payer: Self-pay

## 2018-02-12 ENCOUNTER — Emergency Department (HOSPITAL_COMMUNITY)
Admission: EM | Admit: 2018-02-12 | Discharge: 2018-02-12 | Disposition: A | Discharge status: Home / Self Care | Payer: Medicaid Other | Attending: Emergency Medicine | Admitting: Emergency Medicine

## 2018-02-12 DIAGNOSIS — R0789 Other chest pain: Secondary | ICD-10-CM

## 2018-02-12 DIAGNOSIS — Z79899 Other long term (current) drug therapy: Secondary | ICD-10-CM | POA: Diagnosis not present

## 2018-02-12 DIAGNOSIS — I10 Essential (primary) hypertension: Secondary | ICD-10-CM | POA: Diagnosis not present

## 2018-02-12 DIAGNOSIS — K2970 Gastritis, unspecified, without bleeding: Secondary | ICD-10-CM

## 2018-02-12 DIAGNOSIS — F17210 Nicotine dependence, cigarettes, uncomplicated: Secondary | ICD-10-CM | POA: Insufficient documentation

## 2018-02-12 DIAGNOSIS — R079 Chest pain, unspecified: Secondary | ICD-10-CM | POA: Diagnosis present

## 2018-02-12 DIAGNOSIS — J449 Chronic obstructive pulmonary disease, unspecified: Secondary | ICD-10-CM | POA: Diagnosis not present

## 2018-02-12 LAB — CBC WITH DIFFERENTIAL/PLATELET
ABS IMMATURE GRANULOCYTES: 0.02 10*3/uL (ref 0.00–0.07)
BASOS PCT: 0 %
Basophils Absolute: 0 10*3/uL (ref 0.0–0.1)
EOS ABS: 0.2 10*3/uL (ref 0.0–0.5)
EOS PCT: 3 %
HCT: 43.7 % (ref 39.0–52.0)
Hemoglobin: 14.6 g/dL (ref 13.0–17.0)
Immature Granulocytes: 0 %
Lymphocytes Relative: 28 %
Lymphs Abs: 2.4 10*3/uL (ref 0.7–4.0)
MCH: 31.1 pg (ref 26.0–34.0)
MCHC: 33.4 g/dL (ref 30.0–36.0)
MCV: 93 fL (ref 80.0–100.0)
MONO ABS: 0.6 10*3/uL (ref 0.1–1.0)
MONOS PCT: 7 %
NEUTROS ABS: 5.3 10*3/uL (ref 1.7–7.7)
Neutrophils Relative %: 62 %
PLATELETS: 207 10*3/uL (ref 150–400)
RBC: 4.7 MIL/uL (ref 4.22–5.81)
RDW: 12.6 % (ref 11.5–15.5)
WBC: 8.5 10*3/uL (ref 4.0–10.5)
nRBC: 0 % (ref 0.0–0.2)

## 2018-02-12 LAB — COMPREHENSIVE METABOLIC PANEL
ALT: 23 U/L (ref 0–44)
ANION GAP: 7 (ref 5–15)
AST: 16 U/L (ref 15–41)
Albumin: 4.1 g/dL (ref 3.5–5.0)
Alkaline Phosphatase: 64 U/L (ref 38–126)
BUN: 11 mg/dL (ref 6–20)
CALCIUM: 8.7 mg/dL — AB (ref 8.9–10.3)
CHLORIDE: 106 mmol/L (ref 98–111)
CO2: 26 mmol/L (ref 22–32)
CREATININE: 0.98 mg/dL (ref 0.61–1.24)
Glucose, Bld: 114 mg/dL — ABNORMAL HIGH (ref 70–99)
Potassium: 3.4 mmol/L — ABNORMAL LOW (ref 3.5–5.1)
SODIUM: 139 mmol/L (ref 135–145)
Total Bilirubin: 0.9 mg/dL (ref 0.3–1.2)
Total Protein: 7.4 g/dL (ref 6.5–8.1)

## 2018-02-12 LAB — LIPASE, BLOOD: Lipase: 27 U/L (ref 11–51)

## 2018-02-12 LAB — TROPONIN I

## 2018-02-12 MED ORDER — SUCRALFATE 1 G PO TABS: 1.0000 g | ORAL_TABLET | Freq: Three times a day (TID) | ORAL | 0 refills | Status: DC

## 2018-02-12 MED ORDER — GI COCKTAIL ~~LOC~~
30.0000 mL | Freq: Once | ORAL | Status: AC
  Administered 2018-02-12: 30 mL via ORAL
  Filled 2018-02-12: qty 30

## 2018-02-12 NOTE — ED Triage Notes (Signed)
Pt was seen here last night for chest pain. Was diagnosed with chest wall pain due to a hiatal hernia. Has an appointment set up with the general surgeon for October 24. Came in tonight because the pain is not getting any better. Did not refill his prescription from last night.

## 2018-02-12 NOTE — ED Provider Notes (Signed)
[facility] EMERGENCY DEPARTMENT Provider Note   CSN: [id number] Arrival date & time: 02/12/18  1833     History   Chief Complaint Chief Complaint  Patient presents with  . Chest Pain    HPI [name] is a 37 y.o. male.  HPI Patient presents with ongoing chest and epigastric pain.  This pain is been worked up extensively in the past with recent cardiac catheterization which was normal.  Had endoscopy 2 years ago which demonstrated gastritis and small hiatal hernia.  I have evaluated the patient yesterday.  Laboratory work-up and chest x-ray without acute abnormality.  EKG without ischemic findings.  Chest pain was reproduced with palpation of the left upper chest.  Symptoms of the chest thought likely to be due to chest wall etiology.  Advised to take Tylenol and use Robaxin.  Patient states he did not fill the Robaxin prescription.  He states the pain is ongoing.  No new shortness of breath.  No nausea or vomiting. Past Medical History:  Diagnosis Date  . Alpha-1-antitrypsin deficiency carrier    labs 05/2016 with documented MZ allele  . Anxiety   . Bipolar 2 disorder (HCC)   . Chronic abdominal pain    HIDA scan Sept 2010, EF 93%, s/p chole oct 2010; evaluated at [facility] for abdominal wall pain; Imipramine added May 2011  . Chronic vomiting    normal GES 2009  . Depression    increased over past several months  . GERD (gastroesophageal reflux disease)    Bravo study Nov 2009 on Prilosec 40 BID with adequate acid suppression  . Hiatal hernia   . Hypertension   . S/P endoscopy    2008 Dr. Karilyn Cota: erosive reflux esophagitis, antral gastritis, H.pylori serologies neg, Nov 2009 Dr. Darrick Penna: gastritis, benign esophageal polyp, no H.pylori,  Feb 2011: [facility], Dr. Bubba Hales: normal esophagus, normal stomach, normal duodenum, path unremarkable  . Shortness of breath    SOB even with no exertion  . Skull fracture (HCC)   . Sleep apnea    supposed to sleep with CPAP  . Ventral  hernia     Patient Active Problem List   Diagnosis Date Noted  . Hiatal hernia 06/14/2016  . Alpha-1-antitrypsin deficiency carrier 06/14/2016  . Atypical chest pain 06/14/2016  . Abnormal nuclear stress test   . Intracranial hemorrhage (HCC) 01/20/2016  . Nausea without vomiting 12/15/2015  . Early satiety 07/25/2015  . Gastroesophageal reflux disease with esophagitis 06/27/2015  . Nausea vomiting and diarrhea 04/27/2015  . Transaminitis 04/27/2015  . Cough 06/23/2013  . Tobacco abuse counseling 06/23/2013  . Abdominal cramping 05/05/2013  . COPD (chronic obstructive pulmonary disease) with acute bronchitis (HCC) 05/05/2013  . Syncope 05/05/2013  . Depression with anxiety 05/05/2013  . Hyperglycemia 05/05/2013  . Black-out (not amnesia) 05/05/2013  . Smoking 04/16/2013  . Blackout spell 04/16/2013  . Hx of bipolar disorder 04/16/2013  . GERD (gastroesophageal reflux disease) 04/16/2013  . OSA (obstructive sleep apnea) 04/16/2013  . Dyspnea 04/16/2013  . Bipolar 2 disorder (HCC) 03/08/2013  . HTN (hypertension) 03/08/2013  . Tobacco abuse 03/08/2013  . Referred for management of medication therapy 03/08/2013  . Asthma exacerbation, non-allergic 03/08/2013  . Sore throat 03/08/2013  . Unspecified sleep apnea 03/08/2013  . Pain in joint, ankle and foot 11/25/2012  . Hyperbilirubinemia 01/10/2012  . Ventral hernia without obstruction or gangrene 11/12/2011  . Obesity 11/12/2011  . Abdominal wall pain 03/06/2011  . NAUSEA WITH VOMITING 05/15/2009  . WEIGHT GAIN,  ABNORMAL 03/21/2009  . EPIGASTRIC PAIN, CHRONIC 03/21/2009  . 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 SMOKER 03/20/2009  . DEPRESSION 03/20/2009  . HYPERTENSION 03/20/2009  . GERD 03/20/2009  . HEMATEMESIS 03/20/2009  . SHOULDER PAIN, RIGHT 03/20/2009  . ANOREXIA 03/20/2009  . ABDOMINAL PAIN, GENERALIZED 03/20/2009  . CANNABIS ABUSE, HX OF 03/20/2009    Past Surgical History:  Procedure Laterality Date  . APPENDECTOMY  5/10    . BIOPSY  08/15/2015   Procedure: BIOPSY;  Surgeon: West Bali, MD;  Location: [facility];  Service: Endoscopy;;  gastric bx  . CHOLECYSTECTOMY  10/10  . ESOPHAGOGASTRODUODENOSCOPY  05/2006   H.pylori neg  . ESOPHAGOGASTRODUODENOSCOPY  03/23/2008   Fields-gastritis benign esophageal polyp, otherwise normal. Negative H. pylori he (propofol)  . ESOPHAGOGASTRODUODENOSCOPY (EGD) WITH PROPOFOL N/A 08/15/2015   Procedure: ESOPHAGOGASTRODUODENOSCOPY (EGD) WITH PROPOFOL;  Surgeon: West Bali, MD;  Location: [facility];  Service: Endoscopy;  Laterality: N/A;  1610  . INCISIONAL HERNIA REPAIR  03/04/2012   Procedure: LAPAROSCOPIC INCISIONAL HERNIA;  Surgeon: Dalia Heading, MD;  Location: [facility] ORS;  Service: General;  Laterality: N/A;  repair, recurrent  . INCISIONAL HERNIA REPAIR N/A 01/12/2014   Procedure: HERNIA REPAIR INCISIONAL WITH MESH;  Surgeon: Dalia Heading, MD;  Location: [facility] OR[facility];  Service: General;  Laterality: N/A;  . INGUINAL HERNIA REPAIR     child  . INSERTION OF MESH  03/04/2012   Procedure: INSERTION OF MESH;  Surgeon: Dalia Heading, MD;  Location: [facility] OR[facility];  Service: General;  Laterality: N/A;  . INSERTION OF MESH N/A 01/12/2014   Procedure: INSERTION OF MESH;  Surgeon: Dalia Heading, MD;  Location: [facility] ORS;  Service: General;  Laterality: N/A;  . KNEE SURGERY Right 2009  . LEFT HEART CATH AND CORONARY ANGIOGRAPHY N/A 06/12/2016   Procedure: Left Heart Cath and Coronary Angiography;  Surgeon: Corky Crafts, MD;  Location: [facility] INVASIVE CV LAB;  Service: Cardiovascular;  Laterality: N/A;  . VENTRAL HERNIA REPAIR  March 2012   Dr. Lovell Sheehan        Home Medications    Prior to Admission medications   Medication Sig Start Date End Date Taking? Authorizing Provider  albuterol (PROVENTIL HFA;VENTOLIN HFA) 108 (90 Base) MCG/ACT inhaler Inhale 2 puffs into the lungs every 6 (six) hours as needed for wheezing or shortness of breath.   Yes [provider]  ALPRAZolam  Prudy Feeler) 1 MG tablet Take 2 mg by mouth 3 (three) times daily as needed.    Yes [provider]  fluticasone (FLONASE) 50 MCG/ACT nasal spray Place 2 sprays into both nostrils daily as needed for allergies.    Yes [provider]  loratadine (CLARITIN) 10 MG tablet Take 10 mg by mouth daily as needed for allergies.   Yes [provider]  omeprazole (PRILOSEC) 40 MG capsule Take 40 mg by mouth 2 (two) times daily.   Yes [provider]  methocarbamol (ROBAXIN) 500 MG tablet Take 2 tablets (1,000 mg total) by mouth every 6 (six) hours as needed for muscle spasms. 02/11/18   Loren Racer, MD  nitroGLYCERIN (NITROSTAT) 0.4 MG SL tablet Place 1 tablet (0.4 mg total) under the tongue every 5 (five) minutes as needed for chest pain. 05/30/16 10/13/16  Laqueta Linden, MD  sucralfate (CARAFATE) 1 g tablet Take 1 tablet (1 g total) by mouth 4 (four) times daily -  with meals and at bedtime. 02/12/18   Loren Racer, MD    Family  History Family History  Problem Relation Age of Onset  . Diabetes Mother   . CAD Mother   . CAD Father   . Colon cancer Neg Hx     Social History Social History   Tobacco Use  . Smoking status: Current Every Day Smoker    Packs/day: 1.00    Years: 15.00    Pack years: 15.00    Types: Cigarettes  . Smokeless tobacco: Never Used  . Tobacco comment: Less than 1/2 pk a day. trying to quit.  03/04/12 1255 states "was doing good, but now up to about a pack per day"  Substance Use Topics  . Alcohol use: No  . Drug use: No     Allergies   Buprenorphine; Dexilant [dexlansoprazole]; Propoxyphene n-acetaminophen; and Zofran [ondansetron hcl]   Review of Systems Review of Systems  Constitutional: Negative for chills and fever.  HENT: Negative for sore throat and trouble swallowing.   Eyes: Negative for visual disturbance.  Respiratory: Negative for cough and shortness of breath.   Cardiovascular: Positive for chest pain.    Gastrointestinal: Positive for abdominal pain. Negative for blood in stool, diarrhea, nausea and vomiting.  Genitourinary: Negative for dysuria, flank pain and frequency.  Musculoskeletal: Positive for myalgias. Negative for back pain, neck pain and neck stiffness.  Skin: Negative for rash and wound.  Neurological: Negative for dizziness, weakness, light-headedness, numbness and headaches.  All other systems reviewed and are negative.    Physical Exam Updated Vital Signs BP 131/87   Pulse 69   Temp (!) 97.5 F (36.4 C) (Oral)   Resp 12   SpO2 97%   Physical Exam  Constitutional: He is oriented to person, place, and time. He appears well-developed and well-nourished. No distress.  HENT:  Head: Normocephalic and atraumatic.  Mouth/Throat: Oropharynx is clear and moist. No oropharyngeal exudate.  Eyes: Pupils are equal, round, and reactive to light. EOM are normal.  Neck: Normal range of motion. Neck supple. No JVD present.  Cardiovascular: Normal rate and regular rhythm. Exam reveals no gallop and no friction rub.  No murmur heard. Pulmonary/Chest: Effort normal and breath sounds normal. He exhibits tenderness.  Chest pain is completely reproduced with palpation over the left pectoralis musculature.  No crepitance or deformity.  Abdominal: Soft. Bowel sounds are normal. There is tenderness. There is no rebound and no guarding.  Very mild epigastric tenderness to palpation.  No rebound or guarding.  Musculoskeletal: Normal range of motion. He exhibits no edema or tenderness.  No lower extremity swelling, asymmetry or tenderness.  Distal pulses intact.  No midline thoracic or lumbar tenderness.  Lymphadenopathy:    He has no cervical adenopathy.  Neurological: He is alert and oriented to person, place, and time.  Moves all extremities without focal deficit.  Sensation intact.  Skin: Skin is warm and dry. No rash noted. He is not diaphoretic. No erythema.  Psychiatric: His behavior  is normal.  Flat affect  Nursing note and vitals reviewed.    ED Treatments / Results  Labs (all labs ordered are listed, but only abnormal results are displayed) Labs Reviewed  COMPREHENSIVE METABOLIC PANEL - Abnormal; Notable for the following components:      Result Value   Potassium 3.4 (*)    Glucose, Bld 114 (*)    Calcium 8.7 (*)    All other components within normal limits  CBC WITH DIFFERENTIAL/PLATELET  LIPASE, BLOOD  TROPONIN I    EKG None  Radiology Dg Chest 2 View  Result Date: 02/11/2018 CLINICAL DATA:  Left-sided chest pain for several hours, initial encounter EXAM: CHEST - 2 VIEW COMPARISON:  05/13/17 FINDINGS: Cardiac shadow is within normal limits. The lungs are well aerated bilaterally. No focal infiltrate or sizable effusion is seen. No bony abnormality is noted. IMPRESSION: No acute abnormality seen. Electronically Signed   By: Alcide Clever M.D.   On: 02/11/2018 18:16    Procedures Procedures (including critical care time)  Medications Ordered in ED Medications  gi cocktail (Maalox,Lidocaine,Donnatal) (30 mLs Oral Given 02/12/18 1922)     Initial Impression / Assessment and Plan / ED Course  I have reviewed the triage vital signs and the nursing notes.  Pertinent labs & imaging results that were available during my care of the patient were reviewed by me and considered in my medical decision making (see chart for details).     Epigastric pain is resolved after GI cocktail.  Suspect gastritis.  Left-sided chest pain reproduced with palpation.  Troponin is normal.  EKG without ischemic findings.  Low suspicion for CAD/PE.  Likely muscular skeletal.  Advised to avoid NSAIDs given his gastritis.  He may use muscle relaxant, Tylenol and heat as needed.  He is to follow-up with his primary physician.  Return precautions given.  Final Clinical Impressions(s) / ED Diagnoses   Final diagnoses:  Gastritis without bleeding, unspecified chronicity,  unspecified gastritis type  Left-sided chest wall pain    ED Discharge Orders         Ordered    sucralfate (CARAFATE) 1 g tablet  3 times daily with meals & bedtime     02/12/18 2055           Loren Racer, MD 02/12/18 2057

## 2018-02-13 ENCOUNTER — Other Ambulatory Visit: Payer: Self-pay | Admitting: Nurse Practitioner

## 2018-02-13 DIAGNOSIS — R7401 Elevation of levels of liver transaminase levels: Secondary | ICD-10-CM

## 2018-02-13 DIAGNOSIS — R740 Nonspecific elevation of levels of transaminase and lactic acid dehydrogenase [LDH]: Principal | ICD-10-CM

## 2018-02-13 LAB — HEPATIC FUNCTION PANEL
AG RATIO: 1.8 (calc) (ref 1.0–2.5)
ALKALINE PHOSPHATASE (APISO): 64 U/L (ref 40–115)
ALT: 20 U/L (ref 9–46)
AST: 14 U/L (ref 10–40)
Albumin: 4.4 g/dL (ref 3.6–5.1)
BILIRUBIN TOTAL: 1.3 mg/dL — AB (ref 0.2–1.2)
Bilirubin, Direct: 0.2 mg/dL (ref 0.0–0.2)
Globulin: 2.4 g/dL (calc) (ref 1.9–3.7)
Indirect Bilirubin: 1.1 mg/dL (calc) (ref 0.2–1.2)
Total Protein: 6.8 g/dL (ref 6.1–8.1)

## 2018-02-13 LAB — HAPTOGLOBIN: HAPTOGLOBIN: 233 mg/dL — AB (ref 43–212)

## 2018-02-13 LAB — RETICULOCYTES
ABS Retic: 86760 cells/uL (ref 25000–9000)
Retic Ct Pct: 1.8 %

## 2018-02-13 NOTE — Progress Notes (Signed)
.  HF

## 2018-02-16 ENCOUNTER — Other Ambulatory Visit: Payer: Self-pay

## 2018-02-16 DIAGNOSIS — R740 Nonspecific elevation of levels of transaminase and lactic acid dehydrogenase [LDH]: Principal | ICD-10-CM

## 2018-02-16 DIAGNOSIS — R7401 Elevation of levels of liver transaminase levels: Secondary | ICD-10-CM

## 2018-02-16 NOTE — Progress Notes (Signed)
PT is aware and we will mail the lab order to him to do in 4 weeks.

## 2018-04-14 ENCOUNTER — Telehealth: Payer: Self-pay

## 2018-04-14 NOTE — Telephone Encounter (Signed)
Pt was seen 02/04/18 and was asked to f/u with cardiology. Tried calling pt several times and wasn't able to get him. Called pts cardiology office and pt no showed an appointment but did make his 04/09/18 apt. Pt has followed up with cardiology as asked by EG at his last apt.

## 2018-04-15 NOTE — Telephone Encounter (Signed)
Noted  

## 2018-05-12 ENCOUNTER — Ambulatory Visit: Payer: Medicaid Other | Admitting: Gastroenterology

## 2018-05-12 ENCOUNTER — Encounter: Payer: Self-pay | Admitting: Gastroenterology

## 2018-05-12 ENCOUNTER — Telehealth: Payer: Self-pay | Admitting: Gastroenterology

## 2018-05-12 NOTE — Telephone Encounter (Signed)
PATIENT WAS A NO SHOW AND LETTER SENT  °

## 2018-05-28 ENCOUNTER — Emergency Department (HOSPITAL_COMMUNITY)
Admission: EM | Admit: 2018-05-28 | Discharge: 2018-05-29 | Disposition: A | Discharge status: Home / Self Care | Payer: Medicaid Other | Attending: Emergency Medicine | Admitting: Emergency Medicine

## 2018-05-28 ENCOUNTER — Encounter (HOSPITAL_COMMUNITY): Payer: Self-pay

## 2018-05-28 ENCOUNTER — Other Ambulatory Visit: Payer: Self-pay

## 2018-05-28 ENCOUNTER — Emergency Department (HOSPITAL_COMMUNITY): Payer: Medicaid Other

## 2018-05-28 DIAGNOSIS — I10 Essential (primary) hypertension: Secondary | ICD-10-CM | POA: Diagnosis not present

## 2018-05-28 DIAGNOSIS — Z87891 Personal history of nicotine dependence: Secondary | ICD-10-CM | POA: Diagnosis not present

## 2018-05-28 DIAGNOSIS — J449 Chronic obstructive pulmonary disease, unspecified: Secondary | ICD-10-CM | POA: Insufficient documentation

## 2018-05-28 DIAGNOSIS — Z79899 Other long term (current) drug therapy: Secondary | ICD-10-CM | POA: Diagnosis not present

## 2018-05-28 DIAGNOSIS — G43901 Migraine, unspecified, not intractable, with status migrainosus: Secondary | ICD-10-CM | POA: Diagnosis not present

## 2018-05-28 DIAGNOSIS — G43009 Migraine without aura, not intractable, without status migrainosus: Secondary | ICD-10-CM | POA: Diagnosis present

## 2018-05-28 MED ORDER — PROCHLORPERAZINE EDISYLATE 10 MG/2ML IJ SOLN
10.0000 mg | Freq: Once | INTRAMUSCULAR | Status: AC
  Administered 2018-05-28: 10 mg via INTRAVENOUS
  Filled 2018-05-28: qty 2

## 2018-05-28 MED ORDER — DIPHENHYDRAMINE HCL 50 MG/ML IJ SOLN
50.0000 mg | Freq: Once | INTRAMUSCULAR | Status: AC
  Administered 2018-05-28: 50 mg via INTRAVENOUS
  Filled 2018-05-28: qty 1

## 2018-05-28 MED ORDER — SODIUM CHLORIDE 0.9 % IV BOLUS
1000.0000 mL | Freq: Once | INTRAVENOUS | Status: AC
  Administered 2018-05-28: 1000 mL via INTRAVENOUS

## 2018-05-28 MED ORDER — KETOROLAC TROMETHAMINE 30 MG/ML IJ SOLN
30.0000 mg | Freq: Once | INTRAMUSCULAR | Status: AC
  Administered 2018-05-28: 30 mg via INTRAVENOUS

## 2018-05-28 MED ORDER — KETOROLAC TROMETHAMINE 30 MG/ML IJ SOLN
INTRAMUSCULAR | Status: AC
  Administered 2018-05-28: 30 mg
  Filled 2018-05-28: qty 1

## 2018-05-28 NOTE — ED Triage Notes (Signed)
Pt reports having migraines x 3 years and seeing Dr Lodema Hong trying to get pt on headache regimen. PT was seen Monday and Wednesday by this Dr and given injection yesterday and Toradol shot on Monday. PT reports still has migraine with light sensitivity

## 2018-05-28 NOTE — ED Provider Notes (Signed)
[facility] EMERGENCY DEPARTMENT Provider Note   CSN: [id number] Arrival date & time: 05/28/18  1950     History   Chief Complaint Chief Complaint  Patient presents with  . Migraine    HPI Jeffrey Cousindmund D Cooley is a 38 y.o. male.  Patient with history of migraines. Recurrent headache since Monday. Has been seen in the office twice this week. Migraine persists. Sensitive to light. Increase pain with eye movement. Soreness in neck. Increased neck discomfort when placing chin to chest.   The history is provided by the patient.  Migraine  This is a recurrent problem. The current episode started more than 2 days ago. The problem occurs constantly. The problem has not changed since onset.Associated symptoms include headaches.    Past Medical History:  Diagnosis Date  . Alpha-1-antitrypsin deficiency carrier    labs 05/2016 with documented MZ allele  . Anxiety   . Bipolar 2 disorder (HCC)   . Chronic abdominal pain    HIDA scan Sept 2010, EF 93%, s/p chole oct 2010; [facility]; Imipramine added May 2011  . Chronic vomiting    normal GES 2009  . Depression    increased over past several months  . GERD (gastroesophageal reflux disease)    Bravo study Nov 2009 on Prilosec 40 BID with adequate acid suppression  . Hiatal hernia   . Hypertension   . S/P endoscopy    2008 Dr. Karilyn Cotaehman: erosive reflux esophagitis, antral gastritis, H.pylori serologies neg, Nov 2009 Dr. Darrick PennaFields: gastritis, benign esophageal polyp, no H.pylori,  Feb 2011: [facility], Dr. Bubba HalesBurggen: normal esophagus, normal stomach, normal duodenum, path unremarkable  . Shortness of breath    SOB even with no exertion  . Skull fracture (HCC)   . Sleep apnea    supposed to sleep with CPAP  . Ventral hernia     Patient Active Problem List   Diagnosis Date Noted  . Hiatal hernia 06/14/2016  . Alpha-1-antitrypsin deficiency carrier 06/14/2016  . Atypical chest pain 06/14/2016  . Abnormal nuclear  stress test   . Intracranial hemorrhage (HCC) 01/20/2016  . Nausea without vomiting 12/15/2015  . Early satiety 07/25/2015  . Gastroesophageal reflux disease with esophagitis 06/27/2015  . Nausea vomiting and diarrhea 04/27/2015  . Transaminitis 04/27/2015  . Cough 06/23/2013  . Tobacco abuse counseling 06/23/2013  . Abdominal cramping 05/05/2013  . COPD (chronic obstructive pulmonary disease) with acute bronchitis (HCC) 05/05/2013  . Syncope 05/05/2013  . Depression with anxiety 05/05/2013  . Hyperglycemia 05/05/2013  . Black-out (not amnesia) 05/05/2013  . Smoking 04/16/2013  . Blackout spell 04/16/2013  . Hx of bipolar disorder 04/16/2013  . GERD (gastroesophageal reflux disease) 04/16/2013  . OSA (obstructive sleep apnea) 04/16/2013  . Dyspnea 04/16/2013  . Bipolar 2 disorder (HCC) 03/08/2013  . HTN (hypertension) 03/08/2013  . Tobacco abuse 03/08/2013  . Referred for management of medication therapy 03/08/2013  . Asthma exacerbation, non-allergic 03/08/2013  . Sore throat 03/08/2013  . Unspecified sleep apnea 03/08/2013  . Pain in joint, ankle and foot 11/25/2012  . Hyperbilirubinemia 01/10/2012  . Ventral hernia without obstruction or gangrene 11/12/2011  . Obesity 11/12/2011  . Abdominal wall pain 03/06/2011  . NAUSEA WITH VOMITING 05/15/2009  . WEIGHT GAIN, ABNORMAL 03/21/2009  . EPIGASTRIC PAIN, CHRONIC 03/21/2009  . 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 SMOKER 03/20/2009  . DEPRESSION 03/20/2009  . HYPERTENSION 03/20/2009  . GERD 03/20/2009  . HEMATEMESIS 03/20/2009  . SHOULDER PAIN, RIGHT 03/20/2009  . ANOREXIA 03/20/2009  .  ABDOMINAL PAIN, GENERALIZED 03/20/2009  . CANNABIS ABUSE, HX OF 03/20/2009    Past Surgical History:  Procedure Laterality Date  . APPENDECTOMY  5/10  . BIOPSY  08/15/2015   Procedure: BIOPSY;  Surgeon: West Bali, MD;  Location: [facility];  Service: Endoscopy;;  gastric bx  . CHOLECYSTECTOMY  10/10  . ESOPHAGOGASTRODUODENOSCOPY  05/2006    H.pylori neg  . ESOPHAGOGASTRODUODENOSCOPY  03/23/2008   Fields-gastritis benign esophageal polyp, otherwise normal. Negative H. pylori he (propofol)  . ESOPHAGOGASTRODUODENOSCOPY (EGD) WITH PROPOFOL N/A 08/15/2015   Procedure: ESOPHAGOGASTRODUODENOSCOPY (EGD) WITH PROPOFOL;  Surgeon: West Bali, MD;  Location: [facility];  Service: Endoscopy;  Laterality: N/A;  4332  . INCISIONAL HERNIA REPAIR  03/04/2012   Procedure: LAPAROSCOPIC INCISIONAL HERNIA;  Surgeon: Dalia Heading, MD;  Location: [facility];  Service: General;  Laterality: N/A;  repair, recurrent  . INCISIONAL HERNIA REPAIR N/A 01/12/2014   Procedure: HERNIA REPAIR INCISIONAL WITH MESH;  Surgeon: Dalia Heading, MD;  Location: [facility];  Service: General;  Laterality: N/A;  . INGUINAL HERNIA REPAIR     child  . INSERTION OF MESH  03/04/2012   Procedure: INSERTION OF MESH;  Surgeon: Dalia Heading, MD;  Location: [facility];  Service: General;  Laterality: N/A;  . INSERTION OF MESH N/A 01/12/2014   Procedure: INSERTION OF MESH;  Surgeon: Dalia Heading, MD;  Location: [facility];  Service: General;  Laterality: N/A;  . KNEE SURGERY Right 2009  . LEFT HEART CATH AND CORONARY ANGIOGRAPHY N/A 06/12/2016   Procedure: Left Heart Cath and Coronary Angiography;  Surgeon: Corky Crafts, MD;  Location: [facility] INVASIVE CV LAB;  Service: Cardiovascular;  Laterality: N/A;  . VENTRAL HERNIA REPAIR  March 2012   Dr. Lovell Sheehan        Home Medications    Prior to Admission medications   Medication Sig Start Date End Date Taking? Authorizing Provider  albuterol (PROVENTIL HFA;VENTOLIN HFA) 108 (90 Base) MCG/ACT inhaler Inhale 2 puffs into the lungs every 6 (six) hours as needed for wheezing or shortness of breath.    [provider]  ALPRAZolam Prudy Feeler) 1 MG tablet Take 2 mg by mouth 3 (three) times daily as needed.     [provider]  fluticasone (FLONASE) 50 MCG/ACT nasal spray Place 2 sprays into both nostrils daily as needed for  allergies.     [provider]  loratadine (CLARITIN) 10 MG tablet Take 10 mg by mouth daily as needed for allergies.    [provider]  methocarbamol (ROBAXIN) 500 MG tablet Take 2 tablets (1,000 mg total) by mouth every 6 (six) hours as needed for muscle spasms. 02/11/18   Loren Racer, MD  nitroGLYCERIN (NITROSTAT) 0.4 MG SL tablet Place 1 tablet (0.4 mg total) under the tongue every 5 (five) minutes as needed for chest pain. 05/30/16 10/13/16  Laqueta Linden, MD  omeprazole (PRILOSEC) 40 MG capsule Take 40 mg by mouth 2 (two) times daily.    [provider]  sucralfate (CARAFATE) 1 g tablet Take 1 tablet (1 g total) by mouth 4 (four) times daily -  with meals and at bedtime. 02/12/18   Loren Racer, MD    Family History Family History  Problem Relation Age of Onset  . Diabetes Mother   . CAD Mother   . CAD Father   . Colon cancer Neg Hx     Social History Social History   Tobacco Use  . Smoking  status: Former Smoker    Packs/day: 1.00    Years: 15.00    Pack years: 15.00    Types: Cigarettes  . Smokeless tobacco: Never Used  . Tobacco comment: quit- 05/06/18  Substance Use Topics  . Alcohol use: No  . Drug use: No     Allergies   Buprenorphine; Dexilant [dexlansoprazole]; Propoxyphene n-acetaminophen; and Zofran [ondansetron hcl]   Review of Systems Review of Systems  Eyes: Positive for photophobia. Negative for visual disturbance.  Musculoskeletal: Positive for neck pain and neck stiffness.  Neurological: Positive for headaches.  All other systems reviewed and are negative.    Physical Exam Updated Vital Signs BP 119/69 (BP Location: Right Arm)   Pulse 71   Temp 98.8 F (37.1 C) (Oral)   Resp 18   Ht 5\' 9"  (1.753 m)   Wt 127 kg   SpO2 98%   BMI 41.35 kg/m   Physical Exam Vitals signs and nursing note reviewed.  Constitutional:      Appearance: Normal appearance. He is not ill-appearing.  HENT:     Head:  Atraumatic.  Eyes:     Extraocular Movements: Extraocular movements intact.     Conjunctiva/sclera: Conjunctivae normal.     Pupils: Pupils are equal, round, and reactive to light.  Neck:     Musculoskeletal: Muscular tenderness present. No neck rigidity.  Cardiovascular:     Rate and Rhythm: Normal rate and regular rhythm.  Pulmonary:     Effort: Pulmonary effort is normal.     Breath sounds: Normal breath sounds.  Abdominal:     Palpations: Abdomen is soft.  Musculoskeletal: Normal range of motion.  Skin:    General: Skin is warm and dry.  Neurological:     General: No focal deficit present.     Mental Status: He is alert and oriented to person, place, and time.     Cranial Nerves: No cranial nerve deficit.     Sensory: No sensory deficit.     Coordination: Coordination normal.  Psychiatric:        Mood and Affect: Mood normal.      ED Treatments / Results  Labs (all labs ordered are listed, but only abnormal results are displayed) Labs Reviewed - No data to display  EKG None  Radiology Ct Head Wo Contrast  Result Date: 05/28/2018 CLINICAL DATA:  Migraine headache for 3 years, photophobia today. History of hypertension and skull fracture. EXAM: CT HEAD WITHOUT CONTRAST TECHNIQUE: Contiguous axial images were obtained from the base of the skull through the vertex without intravenous contrast. COMPARISON:  CT HEAD January 22, 2016. FINDINGS: BRAIN: No intraparenchymal hemorrhage, mass effect nor midline shift. The ventricles and sulci are normal. No acute large vascular territory infarcts. No abnormal extra-axial fluid collections. Basal cisterns are patent. VASCULAR: Unremarkable. SKULL/SOFT TISSUES: No skull fracture. Old RIGHT temporal bone nondisplaced fracture. Small RIGHT parietal scalp suspected hematoma without subcutaneous gas or radiopaque foreign bodies. ORBITS/SINUSES: The included ocular globes and orbital contents are normal.Trace paranasal sinus mucosal  thickening. Mastoid air cells are well aerated. OTHER: None. IMPRESSION: 1. No acute intracranial process. 2. Old healed RIGHT temporal bone fracture; otherwise negative noncontrast CT HEAD. Electronically Signed   By: Awilda Metro M.D.   On: 05/28/2018 22:21    Procedures Procedures (including critical care time)  Medications Ordered in ED Medications  prochlorperazine (COMPAZINE) injection 10 mg (10 mg Intravenous Given 05/28/18 2139)  diphenhydrAMINE (BENADRYL) injection 50 mg (50 mg Intravenous Given 05/28/18 2138)  sodium chloride 0.9 % bolus 1,000 mL (0 mLs Intravenous Stopped 05/28/18 2306)  ketorolac (TORADOL) 30 MG/ML injection 30 mg (30 mg Intravenous Given 05/28/18 2300)  ketorolac (TORADOL) 30 MG/ML injection (30 mg  Given 05/28/18 2247)     Initial Impression / Assessment and Plan / ED Course  I have reviewed the triage vital signs and the nursing notes.  Pertinent labs & imaging results that were available during my care of the patient were reviewed by me and considered in my medical decision making (see chart for details).     Pt HA treated and improved while in ED.  Presentation is like pts typical HA and non concerning [facility], Meningitis, or temporal arteritis. Pt is afebrile with no focal neuro deficits, nuchal rigidity, or change in vision. Pt is to follow up with PCP to discuss prophylactic medication. Pt verbalizes understanding and is agreeable with plan to dc.   Final Clinical Impressions(s) / ED Diagnoses   Final diagnoses:  Migraine with status migrainosus, not intractable, unspecified migraine type    ED Discharge Orders    None       Felicie MornSmith, Desera Graffeo, NP 05/28/18 2349    Loren RacerYelverton, Alasdair Kleve, MD 05/29/18 [phone]

## 2018-06-16 ENCOUNTER — Other Ambulatory Visit (HOSPITAL_BASED_OUTPATIENT_CLINIC_OR_DEPARTMENT_OTHER): Payer: Self-pay

## 2018-06-16 DIAGNOSIS — G4733 Obstructive sleep apnea (adult) (pediatric): Secondary | ICD-10-CM

## 2018-06-19 ENCOUNTER — Other Ambulatory Visit: Payer: Self-pay

## 2018-06-19 ENCOUNTER — Emergency Department (HOSPITAL_COMMUNITY)
Admission: EM | Admit: 2018-06-19 | Discharge: 2018-06-19 | Disposition: A | Discharge status: Home / Self Care | Payer: Medicaid Other | Attending: Emergency Medicine | Admitting: Emergency Medicine

## 2018-06-19 ENCOUNTER — Encounter (HOSPITAL_COMMUNITY): Payer: Self-pay | Admitting: Emergency Medicine

## 2018-06-19 DIAGNOSIS — R51 Headache: Secondary | ICD-10-CM | POA: Diagnosis present

## 2018-06-19 DIAGNOSIS — Z87891 Personal history of nicotine dependence: Secondary | ICD-10-CM | POA: Insufficient documentation

## 2018-06-19 DIAGNOSIS — R110 Nausea: Secondary | ICD-10-CM | POA: Insufficient documentation

## 2018-06-19 DIAGNOSIS — I10 Essential (primary) hypertension: Secondary | ICD-10-CM | POA: Diagnosis not present

## 2018-06-19 DIAGNOSIS — R519 Headache, unspecified: Secondary | ICD-10-CM

## 2018-06-19 DIAGNOSIS — J449 Chronic obstructive pulmonary disease, unspecified: Secondary | ICD-10-CM | POA: Insufficient documentation

## 2018-06-19 DIAGNOSIS — Z79899 Other long term (current) drug therapy: Secondary | ICD-10-CM | POA: Diagnosis not present

## 2018-06-19 MED ORDER — KETOROLAC TROMETHAMINE 30 MG/ML IJ SOLN
30.0000 mg | Freq: Once | INTRAMUSCULAR | Status: AC
  Administered 2018-06-19: 30 mg via INTRAVENOUS
  Filled 2018-06-19: qty 1

## 2018-06-19 MED ORDER — PROMETHAZINE HCL 25 MG RE SUPP: 25.0000 mg | Freq: Four times a day (QID) | RECTAL | 0 refills | Status: DC | PRN

## 2018-06-19 MED ORDER — OXYCODONE-ACETAMINOPHEN 5-325 MG PO TABS: 1.0000 | ORAL_TABLET | Freq: Four times a day (QID) | ORAL | 0 refills | Status: DC | PRN

## 2018-06-19 MED ORDER — METOCLOPRAMIDE HCL 5 MG/ML IJ SOLN
10.0000 mg | Freq: Once | INTRAMUSCULAR | Status: AC
  Administered 2018-06-19: 10 mg via INTRAVENOUS
  Filled 2018-06-19: qty 2

## 2018-06-19 MED ORDER — ALUM & MAG HYDROXIDE-SIMETH 200-200-20 MG/5ML PO SUSP
30.0000 mL | Freq: Once | ORAL | Status: AC
  Administered 2018-06-19: 30 mL via ORAL
  Filled 2018-06-19: qty 30

## 2018-06-19 MED ORDER — LIDOCAINE VISCOUS HCL 2 % MT SOLN
15.0000 mL | Freq: Once | OROMUCOSAL | Status: AC
  Administered 2018-06-19: 15 mL via ORAL
  Filled 2018-06-19: qty 15

## 2018-06-19 MED ORDER — HYDROMORPHONE HCL 1 MG/ML IJ SOLN
1.0000 mg | Freq: Once | INTRAMUSCULAR | Status: AC
  Administered 2018-06-19: 1 mg via INTRAVENOUS
  Filled 2018-06-19: qty 1

## 2018-06-19 MED ORDER — DIPHENHYDRAMINE HCL 50 MG/ML IJ SOLN
25.0000 mg | Freq: Once | INTRAMUSCULAR | Status: AC
  Administered 2018-06-19: 25 mg via INTRAVENOUS
  Filled 2018-06-19: qty 1

## 2018-06-19 NOTE — ED Provider Notes (Signed)
[facility] EMERGENCY DEPARTMENT Provider Note   CSN: [id number] Arrival date & time: 06/19/18  1534     History   Chief Complaint Chief Complaint  Patient presents with  . Headache    HPI [name] is a 38 y.o. male.  Patient complains of a headache.  He also has some nausea.  Patient is a history of headaches  The history is provided by the patient. No language interpreter was used.  Headache  Pain location:  Frontal Quality:  Dull Radiates to:  L neck Severity currently:  6/10 Severity at highest:  8/10 Onset quality:  Sudden Timing:  Constant Progression:  Worsening Chronicity:  Recurrent Similar to prior headaches: yes   Context: not activity   Relieved by:  Nothing Associated symptoms: no abdominal pain, no back pain, no congestion, no cough, no diarrhea, no fatigue, no seizures and no sinus pressure     Past Medical History:  Diagnosis Date  . Alpha-1-antitrypsin deficiency carrier    labs 05/2016 with documented MZ allele  . Anxiety   . Bipolar 2 disorder (HCC)   . Chronic abdominal pain    HIDA scan Sept 2010, EF 93%, s/p chole oct 2010; evaluated at [facility] for abdominal wall pain; Imipramine added May 2011  . Chronic vomiting    normal GES 2009  . Depression    increased over past several months  . GERD (gastroesophageal reflux disease)    Bravo study Nov 2009 on Prilosec 40 BID with adequate acid suppression  . Hiatal hernia   . Hypertension   . S/P endoscopy    2008 Dr. Karilyn Cota: erosive reflux esophagitis, antral gastritis, H.pylori serologies neg, Nov 2009 Dr. Darrick Penna: gastritis, benign esophageal polyp, no H.pylori,  Feb 2011: [facility], Dr. Bubba Hales: normal esophagus, normal stomach, normal duodenum, path unremarkable  . Shortness of breath    SOB even with no exertion  . Skull fracture (HCC)   . Sleep apnea    supposed to sleep with CPAP  . Ventral hernia     Patient Active Problem List   Diagnosis Date Noted  . Hiatal hernia  06/14/2016  . Alpha-1-antitrypsin deficiency carrier 06/14/2016  . Atypical chest pain 06/14/2016  . Abnormal nuclear stress test   . Intracranial hemorrhage (HCC) 01/20/2016  . Nausea without vomiting 12/15/2015  . Early satiety 07/25/2015  . Gastroesophageal reflux disease with esophagitis 06/27/2015  . Nausea vomiting and diarrhea 04/27/2015  . Transaminitis 04/27/2015  . Cough 06/23/2013  . Tobacco abuse counseling 06/23/2013  . Abdominal cramping 05/05/2013  . COPD (chronic obstructive pulmonary disease) with acute bronchitis (HCC) 05/05/2013  . Syncope 05/05/2013  . Depression with anxiety 05/05/2013  . Hyperglycemia 05/05/2013  . Black-out (not amnesia) 05/05/2013  . Smoking 04/16/2013  . Blackout spell 04/16/2013  . Hx of bipolar disorder 04/16/2013  . GERD (gastroesophageal reflux disease) 04/16/2013  . OSA (obstructive sleep apnea) 04/16/2013  . Dyspnea 04/16/2013  . Bipolar 2 disorder (HCC) 03/08/2013  . HTN (hypertension) 03/08/2013  . Tobacco abuse 03/08/2013  . Referred for management of medication therapy 03/08/2013  . Asthma exacerbation, non-allergic 03/08/2013  . Sore throat 03/08/2013  . Unspecified sleep apnea 03/08/2013  . Pain in joint, ankle and foot 11/25/2012  . Hyperbilirubinemia 01/10/2012  . Ventral hernia without obstruction or gangrene 11/12/2011  . Obesity 11/12/2011  . Abdominal wall pain 03/06/2011  . NAUSEA WITH VOMITING 05/15/2009  . WEIGHT GAIN, ABNORMAL 03/21/2009  . EPIGASTRIC PAIN, CHRONIC 03/21/2009  . 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  SMOKER 03/20/2009  . DEPRESSION 03/20/2009  . HYPERTENSION 03/20/2009  . GERD 03/20/2009  . HEMATEMESIS 03/20/2009  . SHOULDER PAIN, RIGHT 03/20/2009  . ANOREXIA 03/20/2009  . ABDOMINAL PAIN, GENERALIZED 03/20/2009  . CANNABIS ABUSE, HX OF 03/20/2009    Past Surgical History:  Procedure Laterality Date  . APPENDECTOMY  5/10  . BIOPSY  08/15/2015   Procedure: BIOPSY;  Surgeon: West Bali, MD;  [facility];  Service: Endoscopy;;  gastric bx  . CHOLECYSTECTOMY  10/10  . ESOPHAGOGASTRODUODENOSCOPY  05/2006   H.pylori neg  . ESOPHAGOGASTRODUODENOSCOPY  03/23/2008   Fields-gastritis benign esophageal polyp, otherwise normal. Negative H. pylori he (propofol)  . ESOPHAGOGASTRODUODENOSCOPY (EGD) WITH PROPOFOL N/A 08/15/2015   Procedure: ESOPHAGOGASTRODUODENOSCOPY (EGD) WITH PROPOFOL;  Surgeon: West Bali, MD;  Location: [facility];  Service: Endoscopy;  Laterality: N/A;  [id number]  . INCISIONAL HERNIA REPAIR  03/04/2012   Procedure: LAPAROSCOPIC INCISIONAL HERNIA;  Surgeon: Dalia Heading, MD;  Location: [facility] ORS;  Service: General;  Laterality: N/A;  repair, recurrent  . INCISIONAL HERNIA REPAIR N/A 01/12/2014   Procedure: HERNIA REPAIR INCISIONAL WITH MESH;  Surgeon: Dalia Heading, MD;  Location: [facility];  Service: General;  Laterality: N/A;  . INGUINAL HERNIA REPAIR     child  . INSERTION OF MESH  03/04/2012   Procedure: INSERTION OF MESH;  Surgeon: Dalia Heading, MD;  Location: [facility];  Service: General;  Laterality: N/A;  . INSERTION OF MESH N/A 01/12/2014   Procedure: INSERTION OF MESH;  Surgeon: Dalia Heading, MD;  Location: [facility];  Service: General;  Laterality: N/A;  . KNEE SURGERY Right 2009  . LEFT HEART CATH AND CORONARY ANGIOGRAPHY N/A 06/12/2016   Procedure: Left Heart Cath and Coronary Angiography;  Surgeon: Corky Crafts, MD;  Location: [facility] INVASIVE CV LAB;  Service: Cardiovascular;  Laterality: N/A;  . VENTRAL HERNIA REPAIR  March 2012   Dr. Lovell Sheehan        Home Medications    Prior to Admission medications   Medication Sig Start Date End Date Taking? Authorizing Provider  albuterol (PROVENTIL HFA;VENTOLIN HFA) 108 (90 Base) MCG/ACT inhaler Inhale 2 puffs into the lungs every 6 (six) hours as needed for wheezing or shortness of breath.   Yes [provider]  ALPRAZolam Prudy Feeler) 1 MG tablet Take 2 mg by mouth 3 (three) times daily as needed.    Yes  [provider]  fluticasone (FLONASE) 50 MCG/ACT nasal spray Place 2 sprays into both nostrils daily as needed for allergies.    Yes [provider]  loratadine (CLARITIN) 10 MG tablet Take 10 mg by mouth daily as needed for allergies.   Yes [provider]  omeprazole (PRILOSEC) 40 MG capsule Take 40 mg by mouth 2 (two) times daily.   Yes [provider]  sucralfate (CARAFATE) 1 g tablet Take 1 tablet (1 g total) by mouth 4 (four) times daily -  with meals and at bedtime. 02/12/18  Yes Loren Racer, MD  methocarbamol (ROBAXIN) 500 MG tablet Take 2 tablets (1,000 mg total) by mouth every 6 (six) hours as needed for muscle spasms. 02/11/18   Loren Racer, MD  nitroGLYCERIN (NITROSTAT) 0.4 MG SL tablet Place 1 tablet (0.4 mg total) under the tongue every 5 (five) minutes as needed for chest pain. 05/30/16 10/13/16  Laqueta Linden, MD  oxyCODONE-acetaminophen (PERCOCET/ROXICET) 5-325 MG tablet Take 1 tablet by mouth every 6 (six) hours as needed. 06/19/18  Bethann BerkshireZammit, Roshanda Balazs, MD  promethazine (PHENERGAN) 25 MG suppository Place 1 suppository (25 mg total) rectally every 6 (six) hours as needed for nausea or vomiting. 06/19/18   Bethann BerkshireZammit, Thomos Domine, MD    Family History Family History  Problem Relation Age of Onset  . Diabetes Mother   . CAD Mother   . CAD Father   . Colon cancer Neg Hx     Social History Social History   Tobacco Use  . Smoking status: Former Smoker    Packs/day: 1.00    Years: 15.00    Pack years: 15.00    Types: Cigarettes  . Smokeless tobacco: Never Used  . Tobacco comment: quit- 05/06/18  Substance Use Topics  . Alcohol use: No  . Drug use: No     Allergies   Buprenorphine; Dexilant [dexlansoprazole]; Propoxyphene n-acetaminophen; and Zofran [ondansetron hcl]   Review of Systems Review of Systems  Constitutional: Negative for appetite change and fatigue.  HENT: Negative for congestion, ear discharge and sinus pressure.     Eyes: Negative for discharge.  Respiratory: Negative for cough.   Cardiovascular: Negative for chest pain.  Gastrointestinal: Negative for abdominal pain and diarrhea.  Genitourinary: Negative for frequency and hematuria.  Musculoskeletal: Negative for back pain.  Skin: Negative for rash.  Neurological: Positive for headaches. Negative for seizures.  Psychiatric/Behavioral: Negative for hallucinations.     Physical Exam Updated Vital Signs BP 129/86 (BP Location: Right Arm)   Pulse 82   Temp 98.1 F (36.7 C) (Temporal)   Resp 14   Ht 5\' 9"  (1.753 m)   Wt 127 kg   SpO2 96%   BMI 41.35 kg/m   Physical Exam Vitals signs and nursing note reviewed.  Constitutional:      Appearance: He is well-developed.  HENT:     Head: Normocephalic.     Nose: Nose normal.  Eyes:     General: No scleral icterus.    Conjunctiva/sclera: Conjunctivae normal.  Neck:     Musculoskeletal: Neck supple.     Thyroid: No thyromegaly.  Cardiovascular:     Rate and Rhythm: Normal rate and regular rhythm.     Heart sounds: No murmur. No friction rub. No gallop.   Pulmonary:     Breath sounds: No stridor. No wheezing or rales.  Chest:     Chest wall: No tenderness.  Abdominal:     General: There is no distension.     Tenderness: There is no abdominal tenderness. There is no rebound.  Musculoskeletal: Normal range of motion.  Lymphadenopathy:     Cervical: No cervical adenopathy.  Skin:    Findings: No erythema or rash.  Neurological:     Mental Status: He is oriented to person, place, and time.     Motor: No abnormal muscle tone.     Coordination: Coordination normal.  Psychiatric:        Behavior: Behavior normal.      ED Treatments / Results  Labs (all labs ordered are listed, but only abnormal results are displayed) Labs Reviewed - No data to display  EKG None  Radiology No results found.  Procedures Procedures (including critical care time)  Medications Ordered in  ED Medications  ketorolac (TORADOL) 30 MG/ML injection 30 mg (30 mg Intravenous Given 06/19/18 1712)  diphenhydrAMINE (BENADRYL) injection 25 mg (25 mg Intravenous Given 06/19/18 1711)  metoCLOPramide (REGLAN) injection 10 mg (10 mg Intravenous Given 06/19/18 1708)  alum & mag hydroxide-simeth (MAALOX/MYLANTA) 200-200-20 MG/5ML suspension 30 mL (30  mLs Oral Given 06/19/18 1714)    And  lidocaine (XYLOCAINE) 2 % viscous mouth solution 15 mL (15 mLs Oral Given 06/19/18 1714)  HYDROmorphone (DILAUDID) injection 1 mg (1 mg Intravenous Given 06/19/18 1753)     Initial Impression / Assessment and Plan / ED Course  I have reviewed the triage vital signs and the nursing notes.  Pertinent labs & imaging results that were available during my care of the patient were reviewed by me and considered in my medical decision making (see chart for details).     Patient with severe headache.  It has improved with migraine cocktail and narcotic pain medicine.  He will follow-up as needed Final Clinical Impressions(s) / ED Diagnoses   Final diagnoses:  Bad headache    ED Discharge Orders         Ordered    oxyCODONE-acetaminophen (PERCOCET/ROXICET) 5-325 MG tablet  Every 6 hours PRN     06/19/18 1858    promethazine (PHENERGAN) 25 MG suppository  Every 6 hours PRN     06/19/18 Josefina Do, MD 06/19/18 1901

## 2018-06-19 NOTE — Discharge Instructions (Addendum)
Follow up if any problems °

## 2018-06-19 NOTE — ED Triage Notes (Signed)
Headache since yesterday, was given medications by his Dr with no relief

## 2018-09-15 ENCOUNTER — Other Ambulatory Visit: Payer: Self-pay

## 2018-09-15 ENCOUNTER — Encounter (HOSPITAL_COMMUNITY): Payer: Self-pay | Admitting: Emergency Medicine

## 2018-09-15 ENCOUNTER — Emergency Department (HOSPITAL_COMMUNITY)
Admission: EM | Admit: 2018-09-15 | Discharge: 2018-09-16 | Disposition: A | Discharge status: Home / Self Care | Payer: Medicaid Other | Attending: Emergency Medicine | Admitting: Emergency Medicine

## 2018-09-15 DIAGNOSIS — J449 Chronic obstructive pulmonary disease, unspecified: Secondary | ICD-10-CM | POA: Diagnosis not present

## 2018-09-15 DIAGNOSIS — I10 Essential (primary) hypertension: Secondary | ICD-10-CM | POA: Diagnosis not present

## 2018-09-15 DIAGNOSIS — G43009 Migraine without aura, not intractable, without status migrainosus: Secondary | ICD-10-CM

## 2018-09-15 DIAGNOSIS — R51 Headache: Secondary | ICD-10-CM | POA: Diagnosis present

## 2018-09-15 DIAGNOSIS — Z87891 Personal history of nicotine dependence: Secondary | ICD-10-CM | POA: Insufficient documentation

## 2018-09-15 DIAGNOSIS — Z79899 Other long term (current) drug therapy: Secondary | ICD-10-CM | POA: Diagnosis not present

## 2018-09-15 MED ORDER — METOCLOPRAMIDE HCL 5 MG/ML IJ SOLN
10.0000 mg | Freq: Once | INTRAMUSCULAR | Status: AC
  Administered 2018-09-15: 23:00:00 10 mg via INTRAVENOUS
  Filled 2018-09-15: qty 2

## 2018-09-15 MED ORDER — HYDROMORPHONE HCL 1 MG/ML IJ SOLN
1.0000 mg | Freq: Once | INTRAMUSCULAR | Status: AC
  Administered 2018-09-16: 1 mg via INTRAVENOUS
  Filled 2018-09-15: qty 1

## 2018-09-15 MED ORDER — KETOROLAC TROMETHAMINE 30 MG/ML IJ SOLN
30.0000 mg | Freq: Once | INTRAMUSCULAR | Status: AC
  Administered 2018-09-15: 23:00:00 30 mg via INTRAVENOUS
  Filled 2018-09-15: qty 1

## 2018-09-15 MED ORDER — DIPHENHYDRAMINE HCL 50 MG/ML IJ SOLN
25.0000 mg | Freq: Once | INTRAMUSCULAR | Status: AC
  Administered 2018-09-15: 23:00:00 25 mg via INTRAVENOUS
  Filled 2018-09-15: qty 1

## 2018-09-15 NOTE — ED Triage Notes (Signed)
Pt states he has a migraine that started Saturday and hasnt went away since. Pt states he sees dr.doonqua for his migraines and have been on meds but arent helping. Pt states pain is worse on right side.

## 2018-09-16 MED ORDER — PROMETHAZINE HCL 25 MG PO TABS: 25.0000 mg | ORAL_TABLET | Freq: Four times a day (QID) | ORAL | 0 refills | Status: AC | PRN

## 2018-09-16 MED ORDER — OXYCODONE-ACETAMINOPHEN 5-325 MG PO TABS: 2.0000 | ORAL_TABLET | ORAL | 0 refills | Status: DC | PRN

## 2018-09-16 NOTE — ED Provider Notes (Signed)
[facility] EMERGENCY DEPARTMENT Provider Note   CSN: [id number] Arrival date & time: 09/15/18  2107    History   Chief Complaint Chief Complaint  Patient presents with  . Headache    HPI [name] is a 38 y.o. male.     HPI   [name] is a 38 y.o. 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  He reports a throbbing pain behind his right eye that radiates to the temple and back of his head.  Pain feels similar to previous headaches and he states they are always on the right side.  He has tried several OTC pain relievers without relief.  The headache is associated with photophobia and nausea.  He normally sees his neurologist for his headaches, but has been unable recently due to the office being closed due to COVID.  Headache was gradual in onset.  He denies vomiting, fever, chills, neck pain or stiffness and dizziness.  No chest pain, dyspnea or cough     Past Medical History:  Diagnosis Date  . Alpha-1-antitrypsin deficiency carrier    labs 05/2016 with documented MZ allele  . Anxiety   . Bipolar 2 disorder (HCC)   . Chronic abdominal pain    HIDA scan Sept 2010, EF 93%, s/p chole oct 2010; evaluated at [facility] for abdominal wall pain; Imipramine added May 2011  . Chronic vomiting    normal GES 2009  . Depression    increased over past several months  . GERD (gastroesophageal reflux disease)    Bravo study Nov 2009 on Prilosec 40 BID with adequate acid suppression  . Hiatal hernia   . Hypertension   . S/P endoscopy    2008 Dr. Karilyn Cota: erosive reflux esophagitis, antral gastritis, H.pylori serologies neg, Nov 2009 Dr. Darrick Penna: gastritis, benign esophageal polyp, no H.pylori,  Feb 2011: [facility], Dr. Bubba Hales: normal esophagus, normal stomach, normal duodenum, path unremarkable  . Shortness of breath    SOB even with no  exertion  . Skull fracture (HCC)   . Sleep apnea    supposed to sleep with CPAP  . Ventral hernia     Patient Active Problem List   Diagnosis Date Noted  . Hiatal hernia 06/14/2016  . Alpha-1-antitrypsin deficiency carrier 06/14/2016  . Atypical chest pain 06/14/2016  . Abnormal nuclear stress test   . Intracranial hemorrhage (HCC) 01/20/2016  . Nausea without vomiting 12/15/2015  . Early satiety 07/25/2015  . Gastroesophageal reflux disease with esophagitis 06/27/2015  . Nausea vomiting and diarrhea 04/27/2015  . Transaminitis 04/27/2015  . Cough 06/23/2013  . Tobacco abuse counseling 06/23/2013  . Abdominal cramping 05/05/2013  . COPD (chronic obstructive pulmonary disease) with acute bronchitis (HCC) 05/05/2013  . Syncope 05/05/2013  . Depression with anxiety 05/05/2013  . Hyperglycemia 05/05/2013  . Black-out (not amnesia) 05/05/2013  . Smoking 04/16/2013  . Blackout spell 04/16/2013  . Hx of bipolar disorder 04/16/2013  . GERD (gastroesophageal reflux disease) 04/16/2013  . OSA (obstructive sleep apnea) 04/16/2013  . Dyspnea 04/16/2013  . Bipolar 2 disorder (HCC) 03/08/2013  . HTN (hypertension) 03/08/2013  . Tobacco abuse 03/08/2013  . Referred for management of medication therapy 03/08/2013  . Asthma exacerbation, non-allergic 03/08/2013  . Sore throat 03/08/2013  . Unspecified sleep apnea 03/08/2013  . Pain in joint, ankle and foot 11/25/2012  . Hyperbilirubinemia 01/10/2012  . Ventral hernia  without obstruction or gangrene 11/12/2011  . Obesity 11/12/2011  . Abdominal wall pain 03/06/2011  . NAUSEA WITH VOMITING 05/15/2009  . WEIGHT GAIN, ABNORMAL 03/21/2009  . EPIGASTRIC PAIN, CHRONIC 03/21/2009  . 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 SMOKER 03/20/2009  . DEPRESSION 03/20/2009  . HYPERTENSION 03/20/2009  . GERD 03/20/2009  . HEMATEMESIS 03/20/2009  . SHOULDER PAIN, RIGHT 03/20/2009  . ANOREXIA 03/20/2009  . ABDOMINAL PAIN, GENERALIZED 03/20/2009  . CANNABIS ABUSE, HX  OF 03/20/2009    Past Surgical History:  Procedure Laterality Date  . APPENDECTOMY  5/10  . BIOPSY  08/15/2015   Procedure: BIOPSY;  Surgeon: West BaliSandi L Fields, MD;  [facility];  Service: Endoscopy;;  gastric bx  . CHOLECYSTECTOMY  10/10  . ESOPHAGOGASTRODUODENOSCOPY  05/2006   H.pylori neg  . ESOPHAGOGASTRODUODENOSCOPY  03/23/2008   Fields-gastritis benign esophageal polyp, otherwise normal. Negative H. pylori he (propofol)  . ESOPHAGOGASTRODUODENOSCOPY (EGD) WITH PROPOFOL N/A 08/15/2015   Procedure: ESOPHAGOGASTRODUODENOSCOPY (EGD) WITH PROPOFOL;  Surgeon: West BaliSandi L Fields, MD;  [facility]: [facility];  Service: Endoscopy;  Laterality: N/A;  1478;  0815  . INCISIONAL HERNIA REPAIR  03/04/2012   Procedure: LAPAROSCOPIC INCISIONAL HERNIA;  Surgeon: Dalia HeadingMark A Jenkins, MD;  Location: [facility];  Service: General;  Laterality: N/A;  repair, recurrent  . INCISIONAL HERNIA REPAIR N/A 01/12/2014   Procedure: HERNIA REPAIR INCISIONAL WITH MESH;  Surgeon: Dalia HeadingMark A Jenkins, MD;  Location: [facility];  Service: General;  Laterality: N/A;  . INGUINAL HERNIA REPAIR     child  . INSERTION OF MESH  03/04/2012   Procedure: INSERTION OF MESH;  Surgeon: Dalia HeadingMark A Jenkins, MD;  Location: [facility];  Service: General;  Laterality: N/A;  . INSERTION OF MESH N/A 01/12/2014   Procedure: INSERTION OF MESH;  Surgeon: Dalia HeadingMark A Jenkins, MD;  Location: [facility];  Service: General;  Laterality: N/A;  . KNEE SURGERY Right 2009  . LEFT HEART CATH AND CORONARY ANGIOGRAPHY N/A 06/12/2016   Procedure: Left Heart Cath and Coronary Angiography;  Surgeon: Corky CraftsJayadeep S Varanasi, MD;  [facility];  Service: Cardiovascular;  Laterality: N/A;  . VENTRAL HERNIA REPAIR  March 2012   Dr. Lovell SheehanJenkins        Home Medications    Prior to Admission medications   Medication Sig Start Date End Date Taking? Authorizing Provider  acetaminophen (TYLENOL) 500 MG tablet Take 500 mg by mouth every 6 (six) hours as needed for mild pain or moderate  pain.   Yes [provider]  albuterol (PROVENTIL HFA;VENTOLIN HFA) 108 (90 Base) MCG/ACT inhaler Inhale 2 puffs into the lungs every 6 (six) hours as needed for wheezing or shortness of breath.   Yes [provider]  alprazolam Prudy Feeler(XANAX) 2 MG tablet Take 2 mg by mouth 3 (three) times daily.    Yes [provider]  fluticasone (FLONASE) 50 MCG/ACT nasal spray Place 2 sprays into both nostrils daily as needed for allergies.    Yes [provider]  ibuprofen (ADVIL) 200 MG tablet Take 200 mg by mouth every 6 (six) hours as needed for mild pain or moderate pain.   Yes [provider]  loratadine (CLARITIN) 10 MG tablet Take 10 mg by mouth daily as needed for allergies.   Yes [provider]  nitroGLYCERIN (NITROSTAT) 0.4 MG SL tablet Place 1 tablet (0.4 mg total) under the tongue every 5 (five) minutes as needed for chest pain. 05/30/16 09/15/18 Yes Laqueta LindenKoneswaran, Suresh A, MD  omeprazole (PRILOSEC) 40 MG  capsule Take 40 mg by mouth 2 (two) times daily.   Yes [provider]  oxyCODONE-acetaminophen (PERCOCET/ROXICET) 5-325 MG tablet Take 2 tablets by mouth every 4 (four) hours as needed for severe pain. 09/16/18   Beyla Loney, PA-C  promethazine (PHENERGAN) 25 MG tablet Take 1 tablet (25 mg total) by mouth every 6 (six) hours as needed for nausea or vomiting. 09/16/18   Ailyn Gladd, PA-C    Family History Family History  Problem Relation Age of Onset  . Diabetes Mother   . CAD Mother   . CAD Father   . Colon cancer Neg Hx     Social History Social History   Tobacco Use  . Smoking status: Former Smoker    Packs/day: 1.00    Years: 15.00    Pack years: 15.00    Types: Cigarettes  . Smokeless tobacco: Never Used  . Tobacco comment: quit- 05/06/18  Substance Use Topics  . Alcohol use: No  . Drug use: No     Allergies   Buprenorphine; Dexilant [dexlansoprazole]; Propoxyphene n-acetaminophen; and Zofran [ondansetron hcl]    Review of Systems Review of Systems  Constitutional: Negative for activity change, appetite change and fever.  HENT: Negative for congestion and trouble swallowing.   Eyes: Positive for photophobia. Negative for pain and visual disturbance.  Respiratory: Negative for shortness of breath.   Cardiovascular: Negative for chest pain.  Gastrointestinal: Positive for nausea. Negative for abdominal pain and vomiting.  Genitourinary: Negative for difficulty urinating and dysuria.  Musculoskeletal: Negative for neck pain and neck stiffness.  Skin: Negative for rash and wound.  Neurological: Positive for headaches. Negative for dizziness, syncope, facial asymmetry, speech difficulty, weakness and numbness.  Psychiatric/Behavioral: Negative for confusion and decreased concentration.     Physical Exam Updated Vital Signs BP 101/64 (BP Location: Left Arm)   Pulse 85   Temp 98.4 F (36.9 C) (Oral)   Resp 16   Ht  (1.753 m)   Wt 127 kg   SpO2 100%   BMI 41.35 kg/m   Physical Exam Vitals signs and nursing note reviewed.  Constitutional:      General: He is not in acute distress.    Appearance: He is well-developed.  HENT:     Head: Normocephalic and atraumatic.  Eyes:     Conjunctiva/sclera: Conjunctivae normal.     Pupils: Pupils are equal, round, and reactive to light.  Neck:     Musculoskeletal: Normal range of motion and neck supple. No neck rigidity, spinous process tenderness or muscular tenderness.     Trachea: Phonation normal.     Meningeal: Kernig's sign absent.  Cardiovascular:     Rate and Rhythm: Normal rate and regular rhythm.     Pulses: Normal pulses.  Pulmonary:     Effort: Pulmonary effort is normal. No respiratory distress.     Breath sounds: Normal breath sounds.  Abdominal:     Palpations: Abdomen is soft.     Tenderness: There is no abdominal tenderness.  Musculoskeletal: Normal range of motion.  Skin:    General: Skin is warm.     Capillary Refill:  Capillary refill takes less than 2 seconds.     Findings: No rash.  Neurological:     Mental Status: He is alert and oriented to person, place, and time.     GCS: GCS eye subscore is 4. GCS verbal subscore is 5. GCS motor subscore is 6.     Cranial Nerves: No cranial nerve deficit.  Sensory: Sensation is intact. No sensory deficit.     Motor: Motor function is intact. No abnormal muscle tone.     Coordination: Coordination is intact.     Gait: Gait is intact. Gait normal.     Deep Tendon Reflexes:     Reflex Scores:      Tricep reflexes are 2+ on the right side and 2+ on the left side.      Bicep reflexes are 2+ on the right side and 2+ on the left side.    Comments: CN III-XII grossly intact.  Speech clear, mentating well.  No motor or sensory deficits on exam.    Psychiatric:        Mood and Affect: Mood normal.        Thought Content: Thought content normal.      ED Treatments / Results  Labs (all labs ordered are listed, but only abnormal results are displayed) Labs Reviewed - No data to display  EKG None  Radiology No results found.  Procedures Procedures (including critical care time)  Medications Ordered in ED Medications  ketorolac (TORADOL) 30 MG/ML injection 30 mg (30 mg Intravenous Given 09/15/18 2325)  diphenhydrAMINE (BENADRYL) injection 25 mg (25 mg Intravenous Given 09/15/18 2326)  metoCLOPramide (REGLAN) injection 10 mg (10 mg Intravenous Given 09/15/18 2326)  HYDROmorphone (DILAUDID) injection 1 mg (1 mg Intravenous Given 09/16/18 0004)     Initial Impression / Assessment and Plan / ED Course  I have reviewed the triage vital signs and the nursing notes.  Pertinent labs & imaging results that were available during my care of the patient were reviewed by me and considered in my medical decision making (see chart for details).        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  Headache tonight similar to previous.  No focal neuro deficits.  No  nuchal rigidity.  Vitals reviewed.  Doubt emergent process.   On recheck, pt reports feeling better after medications and he is tolerating oral fluids.  Appears appropriate for d/c home.  He agrees to arrange f/u with his neurologist, Dr. Gerilyn Pilgrim.  Return precautions discussed.   Final Clinical Impressions(s) / ED Diagnoses   Final diagnoses:  Migraine without aura and without status migrainosus, not intractable    ED Discharge Orders         Ordered    oxyCODONE-acetaminophen (PERCOCET/ROXICET) 5-325 MG tablet  Every 4 hours PRN     09/16/18 0023    promethazine (PHENERGAN) 25 MG tablet  Every 6 hours PRN     09/16/18 0024           Pauline Aus, PA-C 09/16/18 1913    Donnetta Hutching, MD 09/19/18 [phone]

## 2018-09-16 NOTE — Discharge Instructions (Addendum)
Follow-up with your primary provider or with Dr. Ronal Fear office.

## 2018-10-06 ENCOUNTER — Emergency Department (HOSPITAL_COMMUNITY): Payer: Medicaid Other

## 2018-10-06 ENCOUNTER — Emergency Department (HOSPITAL_COMMUNITY)
Admission: EM | Admit: 2018-10-06 | Discharge: 2018-10-07 | Disposition: A | Discharge status: Home / Self Care | Payer: Medicaid Other | Attending: Emergency Medicine | Admitting: Emergency Medicine

## 2018-10-06 ENCOUNTER — Encounter (HOSPITAL_COMMUNITY): Payer: Self-pay | Admitting: Emergency Medicine

## 2018-10-06 ENCOUNTER — Other Ambulatory Visit: Payer: Self-pay

## 2018-10-06 DIAGNOSIS — R4182 Altered mental status, unspecified: Secondary | ICD-10-CM | POA: Diagnosis not present

## 2018-10-06 DIAGNOSIS — Z20828 Contact with and (suspected) exposure to other viral communicable diseases: Secondary | ICD-10-CM | POA: Insufficient documentation

## 2018-10-06 DIAGNOSIS — E669 Obesity, unspecified: Secondary | ICD-10-CM | POA: Diagnosis not present

## 2018-10-06 DIAGNOSIS — Z87891 Personal history of nicotine dependence: Secondary | ICD-10-CM | POA: Insufficient documentation

## 2018-10-06 DIAGNOSIS — Z79899 Other long term (current) drug therapy: Secondary | ICD-10-CM | POA: Diagnosis not present

## 2018-10-06 DIAGNOSIS — T50905A Adverse effect of unspecified drugs, medicaments and biological substances, initial encounter: Secondary | ICD-10-CM | POA: Diagnosis not present

## 2018-10-06 DIAGNOSIS — J449 Chronic obstructive pulmonary disease, unspecified: Secondary | ICD-10-CM | POA: Diagnosis not present

## 2018-10-06 DIAGNOSIS — I10 Essential (primary) hypertension: Secondary | ICD-10-CM | POA: Diagnosis not present

## 2018-10-06 LAB — COMPREHENSIVE METABOLIC PANEL
ALT: 57 U/L — ABNORMAL HIGH (ref 0–44)
AST: 62 U/L — ABNORMAL HIGH (ref 15–41)
Albumin: 4.3 g/dL (ref 3.5–5.0)
Alkaline Phosphatase: 74 U/L (ref 38–126)
Anion gap: 11 (ref 5–15)
BUN: 11 mg/dL (ref 6–20)
CO2: 26 mmol/L (ref 22–32)
Calcium: 8.6 mg/dL — ABNORMAL LOW (ref 8.9–10.3)
Chloride: 102 mmol/L (ref 98–111)
Creatinine, Ser: 1.35 mg/dL — ABNORMAL HIGH (ref 0.61–1.24)
GFR calc Af Amer: >60 mL/min (ref >60)
GFR calc non Af Amer: >60 mL/min (ref >60)
Glucose, Bld: 176 mg/dL — ABNORMAL HIGH (ref 70–99)
Potassium: 3.5 mmol/L (ref 3.5–5.1)
Sodium: 139 mmol/L (ref 135–145)
Total Bilirubin: 0.5 mg/dL (ref 0.3–1.2)
Total Protein: 7.3 g/dL (ref 6.5–8.1)

## 2018-10-06 LAB — BLOOD GAS, ARTERIAL
Acid-Base Excess: 4.4 mmol/L — ABNORMAL HIGH (ref 0.0–2.0)
Bicarbonate: 27 mmol/L (ref 20.0–28.0)
Drawn by: 41977
FIO2: 21
O2 Saturation: 90.4 %
Patient temperature: 37
pCO2 arterial: 58 mmHg — ABNORMAL HIGH (ref 32.0–48.0)
pH, Arterial: 7.332 — ABNORMAL LOW (ref 7.350–7.450)
pO2, Arterial: 61.9 mmHg — ABNORMAL LOW (ref 83.0–108.0)

## 2018-10-06 LAB — CBC WITH DIFFERENTIAL/PLATELET
Abs Immature Granulocytes: 0.19 10*3/uL — ABNORMAL HIGH (ref 0.00–0.07)
Basophils Absolute: 0.1 10*3/uL (ref 0.0–0.1)
Basophils Relative: 1 %
Eosinophils Absolute: 0.5 10*3/uL (ref 0.0–0.5)
Eosinophils Relative: 3 %
HCT: 44.6 % (ref 39.0–52.0)
Hemoglobin: 14.2 g/dL (ref 13.0–17.0)
Immature Granulocytes: 1 %
Lymphocytes Relative: 14 %
Lymphs Abs: 2.1 10*3/uL (ref 0.7–4.0)
MCH: 32.1 pg (ref 26.0–34.0)
MCHC: 31.8 g/dL (ref 30.0–36.0)
MCV: 100.7 fL — ABNORMAL HIGH (ref 80.0–100.0)
Monocytes Absolute: 0.7 10*3/uL (ref 0.1–1.0)
Monocytes Relative: 5 %
Neutro Abs: 11.7 10*3/uL — ABNORMAL HIGH (ref 1.7–7.7)
Neutrophils Relative %: 76 %
Platelets: 197 10*3/uL (ref 150–400)
RBC: 4.43 MIL/uL (ref 4.22–5.81)
RDW: 12.2 % (ref 11.5–15.5)
WBC: 15.2 10*3/uL — ABNORMAL HIGH (ref 4.0–10.5)
nRBC: 0 % (ref 0.0–0.2)

## 2018-10-06 LAB — ETHANOL: Alcohol, Ethyl (B): <10 mg/dL (ref <10)

## 2018-10-06 LAB — CBG MONITORING, ED: Glucose-Capillary: 145 mg/dL — ABNORMAL HIGH (ref 70–99)

## 2018-10-06 LAB — TROPONIN I: Troponin I: <0.03 ng/mL (ref <0.03)

## 2018-10-06 MED ORDER — SODIUM CHLORIDE 0.9 % IV BOLUS
1000.0000 mL | Freq: Once | INTRAVENOUS | Status: AC
  Administered 2018-10-06: 1000 mL via INTRAVENOUS

## 2018-10-06 MED ORDER — IOHEXOL 350 MG/ML SOLN
100.0000 mL | Freq: Once | INTRAVENOUS | Status: AC | PRN
  Administered 2018-10-06: 100 mL via INTRAVENOUS

## 2018-10-06 MED ORDER — NALOXONE HCL 0.4 MG/ML IJ SOLN
0.4000 mg | Freq: Once | INTRAMUSCULAR | Status: AC
  Administered 2018-10-06: 0.4 mg via INTRAVENOUS
  Filled 2018-10-06: qty 1

## 2018-10-06 NOTE — ED Notes (Signed)
Pt states he does not need to urinate at this time, aware of DO, urinal at bedside  
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
## 2018-10-06 NOTE — ED Triage Notes (Signed)
Per [facility] pt was found unresponsive in [employer] parking lot, upon arrival pt had pulse but was not breathing, began bagging pt, per EMS pt was given 2mg  IN and 2mg  IV narcan, pt had bottle of xanax in his pocket and possible cocaine in a cigarette pack

## 2018-10-07 LAB — URINALYSIS, ROUTINE W REFLEX MICROSCOPIC
Bilirubin Urine: NEGATIVE
Glucose, UA: NEGATIVE mg/dL
Hgb urine dipstick: NEGATIVE
Ketones, ur: NEGATIVE mg/dL
Leukocytes,Ua: NEGATIVE
Nitrite: NEGATIVE
Protein, ur: NEGATIVE mg/dL
Specific Gravity, Urine: 1.043 — ABNORMAL HIGH (ref 1.005–1.030)
pH: 5 (ref 5.0–8.0)

## 2018-10-07 LAB — SALICYLATE LEVEL: Salicylate Lvl: <7 mg/dL (ref 2.8–30.0)

## 2018-10-07 LAB — SARS CORONAVIRUS 2 BY RT PCR (HOSPITAL ORDER, PERFORMED IN ~~LOC~~ HOSPITAL LAB): SARS Coronavirus 2: NEGATIVE

## 2018-10-07 LAB — RAPID URINE DRUG SCREEN, HOSP PERFORMED
Amphetamines: NOT DETECTED
Barbiturates: NOT DETECTED
Benzodiazepines: POSITIVE — AB
Cocaine: POSITIVE — AB
Opiates: NOT DETECTED
Tetrahydrocannabinol: POSITIVE — AB

## 2018-10-07 LAB — ACETAMINOPHEN LEVEL: Acetaminophen (Tylenol), Serum: <10 ug/mL — ABNORMAL LOW (ref 10–30)

## 2018-10-07 MED ORDER — ALBUTEROL SULFATE HFA 108 (90 BASE) MCG/ACT IN AERS
6.0000 | INHALATION_SPRAY | Freq: Once | RESPIRATORY_TRACT | Status: AC
  Administered 2018-10-07: 6 via RESPIRATORY_TRACT
  Filled 2018-10-07: qty 6.7

## 2018-10-07 MED ORDER — IPRATROPIUM BROMIDE 0.02 % IN SOLN
2.5000 mL | Freq: Once | RESPIRATORY_TRACT | Status: AC
  Administered 2018-10-07: 0.5 mg via RESPIRATORY_TRACT
  Filled 2018-10-07: qty 2.5

## 2018-10-07 MED ORDER — ALBUTEROL SULFATE HFA 108 (90 BASE) MCG/ACT IN AERS: 2.0000 | INHALATION_SPRAY | RESPIRATORY_TRACT | 2 refills | Status: AC | PRN

## 2018-10-07 NOTE — ED Provider Notes (Signed)
Patient is fully awake and alert. He denies trying to harm self, or taking any overdose of medication. Patient is noted to be on several sedating meds, and UDS is positive for cocaine, BZD, and thc.   On exam, mild wheezing bil. Pt indicates he is out of his inhaler. Albuterol and atrovent mdi treatments given.   Recheck wheezing improved. Pt afebrile. covid neg.   [name] was evaluated in Emergency Department on 10/07/2018 for the symptoms described in the history of present illness. He was evaluated in the context of the global COVID-19 pandemic, which necessitated consideration that the patient might be at risk for infection with the SARS-CoV-2 virus that causes COVID-19. Institutional protocols and algorithms that pertain to the evaluation of patients at risk for COVID-19 are in a state of rapid change based on information released by regulatory bodies including the CDC and federal and state organizations. These policies and algorithms were followed during the patient's care in the ED.  Pt denies headache or any other pain. On exam, he is alert, oriented. Speech clear/fluent. No neck stiffness or rigidity on exam. Chest cta bil. Rrr. No murmur. abd soft nt.   Po fluids provided - pt tolerates well. Ambulated in ED.   Pt exhibits normal mood/affect. Denies SI or any thoughts of self harm.   Pt remains fully awake and alert, breathing comfortably. Drink fluids. Has ambulated again with steady gait and no increased sob.    Patient currently appears stable for d/c.   PCP f/u 1-2 days.  Return precautions provided.         Cathren Laine, MD 10/08/18 [phone]

## 2018-10-07 NOTE — ED Notes (Signed)
Pt ambulated in the hall with out any difficulty.

## 2018-10-07 NOTE — Discharge Instructions (Addendum)
It was our pleasure to provide your ER care today - we hope that you feel better.  Several of the medications on your list of medications have sedating side effects - minimize the use of any sedating medication. Specifically, discuss with your doctor a plan to slowly taper down and off the xanax.  Also avoid any drug use.  No driving for the next 6 hours, or any time when sedated or feeling drowsy.   If wheezing, use albuterol treatment as need.   Follow up with primary care doctor in the next 1-2 days for recheck - call office this AM to arrange follow up appointment.   With your history of sleep apnea, use cpap at night.   Return to ER right away if worse, new symptoms, fever, new or severe pain, chest pain, trouble breathing, weak/faint, other concern.

## 2018-12-01 ENCOUNTER — Emergency Department (HOSPITAL_COMMUNITY)
Admission: EM | Admit: 2018-12-01 | Discharge: 2018-12-01 | Disposition: A | Discharge status: Home / Self Care | Payer: No Typology Code available for payment source | Attending: Emergency Medicine | Admitting: Emergency Medicine

## 2018-12-01 ENCOUNTER — Encounter (HOSPITAL_COMMUNITY): Payer: Self-pay | Admitting: *Deleted

## 2018-12-01 ENCOUNTER — Other Ambulatory Visit: Payer: Self-pay

## 2018-12-01 ENCOUNTER — Emergency Department (HOSPITAL_COMMUNITY): Payer: No Typology Code available for payment source

## 2018-12-01 DIAGNOSIS — M5441 Lumbago with sciatica, right side: Secondary | ICD-10-CM

## 2018-12-01 DIAGNOSIS — Z87891 Personal history of nicotine dependence: Secondary | ICD-10-CM | POA: Diagnosis not present

## 2018-12-01 DIAGNOSIS — M5431 Sciatica, right side: Secondary | ICD-10-CM | POA: Insufficient documentation

## 2018-12-01 DIAGNOSIS — M5432 Sciatica, left side: Secondary | ICD-10-CM | POA: Insufficient documentation

## 2018-12-01 DIAGNOSIS — M545 Low back pain: Secondary | ICD-10-CM | POA: Insufficient documentation

## 2018-12-01 DIAGNOSIS — I10 Essential (primary) hypertension: Secondary | ICD-10-CM | POA: Insufficient documentation

## 2018-12-01 DIAGNOSIS — J449 Chronic obstructive pulmonary disease, unspecified: Secondary | ICD-10-CM | POA: Diagnosis not present

## 2018-12-01 DIAGNOSIS — Z79899 Other long term (current) drug therapy: Secondary | ICD-10-CM | POA: Diagnosis not present

## 2018-12-01 MED ORDER — METHOCARBAMOL 500 MG PO TABS: 500.0000 mg | ORAL_TABLET | Freq: Two times a day (BID) | ORAL | 0 refills | Status: DC

## 2018-12-01 MED ORDER — KETOROLAC TROMETHAMINE 30 MG/ML IJ SOLN
30.0000 mg | Freq: Once | INTRAMUSCULAR | Status: AC
  Administered 2018-12-01: 15:00:00 30 mg via INTRAMUSCULAR
  Filled 2018-12-01: qty 1
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
## 2018-12-15 ENCOUNTER — Other Ambulatory Visit: Payer: Self-pay | Admitting: *Deleted

## 2018-12-15 DIAGNOSIS — Z20822 Contact with and (suspected) exposure to covid-19: Secondary | ICD-10-CM

## 2018-12-16 ENCOUNTER — Encounter (HOSPITAL_COMMUNITY): Payer: Self-pay

## 2018-12-16 ENCOUNTER — Emergency Department (HOSPITAL_COMMUNITY): Payer: Medicaid Other

## 2018-12-16 ENCOUNTER — Emergency Department (HOSPITAL_COMMUNITY)
Admission: EM | Admit: 2018-12-16 | Discharge: 2018-12-16 | Disposition: A | Discharge status: Home / Self Care | Payer: Medicaid Other | Attending: Emergency Medicine | Admitting: Emergency Medicine

## 2018-12-16 ENCOUNTER — Other Ambulatory Visit: Payer: Self-pay

## 2018-12-16 DIAGNOSIS — F17210 Nicotine dependence, cigarettes, uncomplicated: Secondary | ICD-10-CM | POA: Insufficient documentation

## 2018-12-16 DIAGNOSIS — Z20828 Contact with and (suspected) exposure to other viral communicable diseases: Secondary | ICD-10-CM | POA: Insufficient documentation

## 2018-12-16 DIAGNOSIS — R0602 Shortness of breath: Secondary | ICD-10-CM | POA: Diagnosis present

## 2018-12-16 DIAGNOSIS — J449 Chronic obstructive pulmonary disease, unspecified: Secondary | ICD-10-CM | POA: Diagnosis not present

## 2018-12-16 DIAGNOSIS — I10 Essential (primary) hypertension: Secondary | ICD-10-CM | POA: Insufficient documentation

## 2018-12-16 DIAGNOSIS — Z20822 Contact with and (suspected) exposure to covid-19: Secondary | ICD-10-CM

## 2018-12-16 DIAGNOSIS — Z79899 Other long term (current) drug therapy: Secondary | ICD-10-CM | POA: Insufficient documentation

## 2018-12-16 LAB — NOVEL CORONAVIRUS, NAA: SARS-CoV-2, NAA: NOT DETECTED

## 2018-12-16 NOTE — Discharge Instructions (Signed)
Your symptoms are concerning for covid-19 infection.  Stay hydrated, take tylenol/ibuprofen at home as needed.  Use albuterol inhaler as needed.  Wait for your test result.  Follow instruction below.  Return if your condition worsen.
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
## 2018-12-16 NOTE — ED Provider Notes (Signed)
[facility] EMERGENCY DEPARTMENT Provider Note   CSN: [id number] Arrival date & time: 12/16/18  1101     History   Chief Complaint Chief Complaint  Patient presents with  . Chills  . Shortness of Breath    HPI Jeffrey Cousindmund D Cristiano is a 38 y.o. male.     The history is provided by the patient and medical records. No language interpreter was used.  Shortness of Breath    38 year old male with history of anxiety, bipolar, depression, presenting complaining of cold symptoms.  Patient report for the past 4 days he has had subjective fever, chills, decrease in appetite, feeling nauseous, having some loose stools, abdominal cramping, congestion, nonproductive cough, headache, having shortness of breath and not feeling well.  He denies any recent sick contact.  He did travel to [place] to stay with family but denies any gathering of more than 10 people.  He went to a clinic to had a COVID-19 test done yesterday but the result has not come back yet.  He mention taking over-the-counter medication at home but has not felt any better.  He does have history of asthma and COPD and does use his inhaler 4-5 times a day which is slightly increased from his normal baseline.  No prior history of PE or DVT.  Abdominal pain is mild at this time.  Past Medical History:  Diagnosis Date  . Alpha-1-antitrypsin deficiency carrier    labs 05/2016 with documented MZ allele  . Anxiety   . Bipolar 2 disorder (HCC)   . Chronic abdominal pain    HIDA scan Sept 2010, EF 93%, s/p chole oct 2010; [facility] at [facility] for abdominal wall pain; Imipramine added May 2011  . Chronic vomiting    normal GES 2009  . Depression    increased over past several months  . GERD (gastroesophageal reflux disease)    Bravo study Nov 2009 on Prilosec 40 BID with adequate acid suppression  . Hiatal hernia   . Hypertension   . S/P endoscopy    2008 Dr. Karilyn Cotaehman: erosive reflux esophagitis, antral gastritis, H.pylori serologies neg,  Nov 2009 Dr. Darrick PennaFields: gastritis, benign esophageal polyp, no H.pylori,  Feb 2011: [facility], Dr. Bubba HalesBurggen: normal esophagus, normal stomach, normal duodenum, path unremarkable  . Shortness of breath    SOB even with no exertion  . Skull fracture (HCC)   . Sleep apnea    supposed to sleep with CPAP  . Ventral hernia     Patient Active Problem List   Diagnosis Date Noted  . Hiatal hernia 06/14/2016  . Alpha-1-antitrypsin deficiency carrier 06/14/2016  . Atypical chest pain 06/14/2016  . Abnormal nuclear stress test   . Intracranial hemorrhage (HCC) 01/20/2016  . Nausea without vomiting 12/15/2015  . Early satiety 07/25/2015  . Gastroesophageal reflux disease with esophagitis 06/27/2015  . Nausea vomiting and diarrhea 04/27/2015  . Transaminitis 04/27/2015  . Cough 06/23/2013  . Tobacco abuse counseling 06/23/2013  . Abdominal cramping 05/05/2013  . COPD (chronic obstructive pulmonary disease) with acute bronchitis (HCC) 05/05/2013  . Syncope 05/05/2013  . Depression with anxiety 05/05/2013  . Hyperglycemia 05/05/2013  . Black-out (not amnesia) 05/05/2013  . Smoking 04/16/2013  . Blackout spell 04/16/2013  . Hx of bipolar disorder 04/16/2013  . GERD (gastroesophageal reflux disease) 04/16/2013  . OSA (obstructive sleep apnea) 04/16/2013  . Dyspnea 04/16/2013  . Bipolar 2 disorder (HCC) 03/08/2013  . HTN (hypertension) 03/08/2013  . Tobacco abuse 03/08/2013  . Referred for management of medication therapy  03/08/2013  . Asthma exacerbation, non-allergic 03/08/2013  . Sore throat 03/08/2013  . Unspecified sleep apnea 03/08/2013  . Pain in joint, ankle and foot 11/25/2012  . Hyperbilirubinemia 01/10/2012  . Ventral hernia without obstruction or gangrene 11/12/2011  . Obesity 11/12/2011  . Abdominal wall pain 03/06/2011  . NAUSEA WITH VOMITING 05/15/2009  . WEIGHT GAIN, ABNORMAL 03/21/2009  . EPIGASTRIC PAIN, CHRONIC 03/21/2009  . 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 SMOKER 03/20/2009  .  DEPRESSION 03/20/2009  . HYPERTENSION 03/20/2009  . GERD 03/20/2009  . HEMATEMESIS 03/20/2009  . SHOULDER PAIN, RIGHT 03/20/2009  . ANOREXIA 03/20/2009  . ABDOMINAL PAIN, GENERALIZED 03/20/2009  . CANNABIS ABUSE, HX OF 03/20/2009    Past Surgical History:  Procedure Laterality Date  . APPENDECTOMY  5/10  . BIOPSY  08/15/2015   Procedure: BIOPSY;  Surgeon: West Bali, MD;  Location: [facility];  Service: Endoscopy;;  gastric bx  . CHOLECYSTECTOMY  10/10  . ESOPHAGOGASTRODUODENOSCOPY  05/2006   H.pylori neg  . ESOPHAGOGASTRODUODENOSCOPY  03/23/2008   Fields-gastritis benign esophageal polyp, otherwise normal. Negative H. pylori he (propofol)  . ESOPHAGOGASTRODUODENOSCOPY (EGD) WITH PROPOFOL N/A 08/15/2015   Procedure: ESOPHAGOGASTRODUODENOSCOPY (EGD) WITH PROPOFOL;  Surgeon: West Bali, MD;  Location: [facility];  Service: Endoscopy;  Laterality: N/A;  1552  . INCISIONAL HERNIA REPAIR  03/04/2012   Procedure: LAPAROSCOPIC INCISIONAL HERNIA;  Surgeon: Dalia Heading, MD;  Location: [facility] OR[facility];  Service: General;  Laterality: N/A;  repair, recurrent  . INCISIONAL HERNIA REPAIR N/A 01/12/2014   Procedure: HERNIA REPAIR INCISIONAL WITH MESH;  Surgeon: Dalia Heading, MD;  Location: [facility];  Service: General;  Laterality: N/A;  . INGUINAL HERNIA REPAIR     child  . INSERTION OF MESH  03/04/2012   Procedure: INSERTION OF MESH;  Surgeon: Dalia Heading, MD;  Location: [facility];  Service: General;  Laterality: N/A;  . INSERTION OF MESH N/A 01/12/2014   Procedure: INSERTION OF MESH;  Surgeon: Dalia Heading, MD;  Location: [facility];  Service: General;  Laterality: N/A;  . KNEE SURGERY Right 2009  . LEFT HEART CATH AND CORONARY ANGIOGRAPHY N/A 06/12/2016   Procedure: Left Heart Cath and Coronary Angiography;  Surgeon: Corky Crafts, MD;  Location: [facility] INVASIVE CV LAB;  Service: Cardiovascular;  Laterality: N/A;  . VENTRAL HERNIA REPAIR  March 2012   Dr. Lovell Sheehan        Home Medications     Prior to Admission medications   Medication Sig Start Date End Date Taking? Authorizing Provider  acetaminophen (TYLENOL) 500 MG tablet Take 500 mg by mouth every 6 (six) hours as needed for mild pain or moderate pain.    [provider]  albuterol (PROVENTIL HFA;VENTOLIN HFA) 108 (90 Base) MCG/ACT inhaler Inhale 2 puffs into the lungs every 6 (six) hours as needed for wheezing or shortness of breath.    [provider]  albuterol (VENTOLIN HFA) 108 (90 Base) MCG/ACT inhaler Inhale 2 puffs into the lungs every 4 (four) hours as needed for wheezing or shortness of breath. 10/07/18   Cathren Laine, MD  alprazolam Prudy Feeler) 2 MG tablet Take 2 mg by mouth 3 (three) times daily.     [provider]  fluticasone (FLONASE) 50 MCG/ACT nasal spray Place 2 sprays into both nostrils daily as needed for allergies.     [provider]  ibuprofen (ADVIL) 200 MG tablet Take 200 mg by mouth every 6 (six) hours as needed for mild pain  or moderate pain.    [provider]  loratadine (CLARITIN) 10 MG tablet Take 10 mg by mouth daily as needed for allergies.    [provider]  methocarbamol (ROBAXIN) 500 MG tablet Take 1 tablet (500 mg total) by mouth 2 (two) times daily. 12/01/18   Darlin Drop P, PA-C  nitroGLYCERIN (NITROSTAT) 0.4 MG SL tablet Place 1 tablet (0.4 mg total) under the tongue every 5 (five) minutes as needed for chest pain. 05/30/16 09/15/18  Herminio Commons, MD  omeprazole (PRILOSEC) 40 MG capsule Take 40 mg by mouth 2 (two) times daily.    [provider]  oxyCODONE-acetaminophen (PERCOCET/ROXICET) 5-325 MG tablet Take 2 tablets by mouth every 4 (four) hours as needed for severe pain. 09/16/18   Triplett, Tammy, PA-C  promethazine (PHENERGAN) 25 MG tablet Take 1 tablet (25 mg total) by mouth every 6 (six) hours as needed for nausea or vomiting. 09/16/18   Triplett, Tammy, PA-C    Family History Family History  Problem Relation Age  of Onset  . Diabetes Mother   . CAD Mother   . CAD Father   . Colon cancer Neg Hx     Social History Social History   Tobacco Use  . Smoking status: Current Every Day Smoker    Packs/day: 1.00    Years: 15.00    Pack years: 15.00    Types: Cigarettes  . Smokeless tobacco: Never Used  Substance Use Topics  . Alcohol use: No  . Drug use: No    Comment: pt reports he takes xanax but denies other drug use      Allergies   Buprenorphine, Dexilant [dexlansoprazole], Propoxyphene n-acetaminophen, and Zofran [ondansetron hcl]   Review of Systems Review of Systems  Respiratory: Positive for shortness of breath.   All other systems reviewed and are negative.    Physical Exam Updated Vital Signs BP (!) 138/96 (BP Location: Right Arm)   Pulse 96   Temp 98 F (36.7 C) (Oral)   Resp 20   Wt 127 kg   SpO2 97%   BMI 41.35 kg/m   Physical Exam Vitals signs and nursing note reviewed.  Constitutional:      General: He is not in acute distress.    Appearance: He is well-developed.  HENT:     Head: Atraumatic.  Eyes:     Conjunctiva/sclera: Conjunctivae normal.  Neck:     Musculoskeletal: Neck supple.  Cardiovascular:     Rate and Rhythm: Normal rate and regular rhythm.  Pulmonary:     Breath sounds: Wheezing (Faint expiratory wheezes heard) present. No rales.  Abdominal:     Palpations: Abdomen is soft.     Tenderness: There is abdominal tenderness (Very mild generalized abdominal tenderness without guarding or rebound tenderness).  Skin:    Findings: No rash.  Neurological:     Mental Status: He is alert and oriented to person, place, and time.  Psychiatric:        Mood and Affect: Mood normal.      ED Treatments / Results  Labs (all labs ordered are listed, but only abnormal results are displayed) Labs Reviewed - No data to display  EKG None  Radiology Dg Chest Portable 1 View  Result Date: 12/16/2018 CLINICAL DATA:  Shortness of breath. EXAM:  PORTABLE CHEST 1 VIEW COMPARISON:  Radiograph October 06, 2018. FINDINGS: The heart size and mediastinal contours are within normal limits. Both lungs are clear. No pneumothorax or pleural effusion is noted.  The visualized skeletal structures are unremarkable. IMPRESSION: No active disease. Electronically Signed   By: Lupita RaiderJames  Green Jr M.D.   On: 12/16/2018 12:46    Procedures Procedures (including critical care time)  Medications Ordered in ED Medications - No data to display   Initial Impression / Assessment and Plan / ED Course  I have reviewed the triage vital signs and the nursing notes.  Pertinent labs & imaging results that were available during my care of the patient were reviewed by me and considered in my medical decision making (see chart for details).        BP 129/87   Pulse 85   Temp 98 F (36.7 C) (Oral)   Resp 10   Wt 127 kg   SpO2 98%   BMI 41.35 kg/m    Final Clinical Impressions(s) / ED Diagnoses   Final diagnoses:  Suspected Covid-19 Virus Infection    ED Discharge Orders    None     1:38 PM Patient presents with symptoms suggestive of potential COVID-19 infection.  He is afebrile, no hypoxia, blood pressures normal, vital signs stable, in no acute discomfort.  Chest x-ray shows no concerning feature.  He has had a COVID-19 test done yesterday.  I suspect patient is stable to be discharged home with self quarantine, mass, regular handwashing, and taking over-the-counter medication for symptom relief.  Return precaution discussed.  He does have inhaler at home that he can use.  He understands to return if condition worsen.  I do not think this is a abdominal pathology given the URI symptoms that is accompanying with his complaint. However, pt understand to return if condition worsen.   Jeffrey Cousindmund D Cooley was evaluated in Emergency Department on 12/16/2018 for the symptoms described in the history of present illness. He was evaluated in the context of the global  COVID-19 pandemic, which necessitated consideration that the patient might be at risk for infection with the SARS-CoV-2 virus that causes COVID-19. Institutional protocols and algorithms that pertain to the evaluation of patients at risk for COVID-19 are in a state of rapid change based on information released by regulatory bodies including the CDC and federal and state organizations. These policies and algorithms were followed during the patient's care in the ED.    Fayrene Helperran, Riannah Stagner, PA-C 12/16/18 1342    Bethann BerkshireZammit, Joseph, MD 12/22/18 1316

## 2018-12-17 ENCOUNTER — Telehealth: Payer: Self-pay | Admitting: Internal Medicine

## 2018-12-17 NOTE — Telephone Encounter (Signed)
Km from Dr. Josephine Cables office is calling to verify patient's negative COVID testing results. Expressed understanding.

## 2019-06-21 ENCOUNTER — Other Ambulatory Visit (HOSPITAL_COMMUNITY): Payer: Self-pay | Admitting: Internal Medicine

## 2019-06-21 ENCOUNTER — Encounter (HOSPITAL_COMMUNITY): Payer: Self-pay

## 2019-06-21 ENCOUNTER — Other Ambulatory Visit: Payer: Self-pay

## 2019-06-21 ENCOUNTER — Ambulatory Visit (HOSPITAL_COMMUNITY)
Admission: RE | Admit: 2019-06-21 | Discharge: 2019-06-21 | Disposition: A | Discharge status: Home / Self Care | Payer: Medicaid Other | Source: Ambulatory Visit | Attending: Internal Medicine | Admitting: Internal Medicine

## 2019-06-21 DIAGNOSIS — R0789 Other chest pain: Secondary | ICD-10-CM

## 2019-06-25 ENCOUNTER — Encounter (HOSPITAL_COMMUNITY): Payer: Self-pay | Admitting: Emergency Medicine

## 2019-06-25 ENCOUNTER — Emergency Department (HOSPITAL_COMMUNITY)
Admission: EM | Admit: 2019-06-25 | Discharge: 2019-06-25 | Disposition: A | Discharge status: Home / Self Care | Payer: Medicaid Other | Attending: Emergency Medicine | Admitting: Emergency Medicine

## 2019-06-25 ENCOUNTER — Emergency Department (HOSPITAL_COMMUNITY): Payer: Medicaid Other

## 2019-06-25 ENCOUNTER — Other Ambulatory Visit: Payer: Self-pay

## 2019-06-25 DIAGNOSIS — R1012 Left upper quadrant pain: Secondary | ICD-10-CM

## 2019-06-25 DIAGNOSIS — F17200 Nicotine dependence, unspecified, uncomplicated: Secondary | ICD-10-CM | POA: Diagnosis not present

## 2019-06-25 DIAGNOSIS — Z79899 Other long term (current) drug therapy: Secondary | ICD-10-CM | POA: Diagnosis not present

## 2019-06-25 DIAGNOSIS — R0602 Shortness of breath: Secondary | ICD-10-CM | POA: Diagnosis not present

## 2019-06-25 DIAGNOSIS — I10 Essential (primary) hypertension: Secondary | ICD-10-CM | POA: Diagnosis not present

## 2019-06-25 DIAGNOSIS — K219 Gastro-esophageal reflux disease without esophagitis: Secondary | ICD-10-CM | POA: Insufficient documentation

## 2019-06-25 DIAGNOSIS — R0789 Other chest pain: Secondary | ICD-10-CM | POA: Insufficient documentation

## 2019-06-25 DIAGNOSIS — J449 Chronic obstructive pulmonary disease, unspecified: Secondary | ICD-10-CM | POA: Insufficient documentation

## 2019-06-25 LAB — COMPREHENSIVE METABOLIC PANEL
ALT: 26 U/L (ref 0–44)
AST: 18 U/L (ref 15–41)
Albumin: 3.6 g/dL (ref 3.5–5.0)
Alkaline Phosphatase: 86 U/L (ref 38–126)
Anion gap: 10 (ref 5–15)
BUN: 13 mg/dL (ref 6–20)
CO2: 26 mmol/L (ref 22–32)
Calcium: 8.6 mg/dL — ABNORMAL LOW (ref 8.9–10.3)
Chloride: 100 mmol/L (ref 98–111)
Creatinine, Ser: 0.85 mg/dL (ref 0.61–1.24)
GFR calc Af Amer: >60 mL/min (ref >60)
GFR calc non Af Amer: >60 mL/min (ref >60)
Glucose, Bld: 262 mg/dL — ABNORMAL HIGH (ref 70–99)
Potassium: 3.8 mmol/L (ref 3.5–5.1)
Sodium: 136 mmol/L (ref 135–145)
Total Bilirubin: 0.4 mg/dL (ref 0.3–1.2)
Total Protein: 6.2 g/dL — ABNORMAL LOW (ref 6.5–8.1)

## 2019-06-25 LAB — URINALYSIS, ROUTINE W REFLEX MICROSCOPIC
Bilirubin Urine: NEGATIVE
Glucose, UA: 50 mg/dL — AB
Hgb urine dipstick: NEGATIVE
Ketones, ur: NEGATIVE mg/dL
Leukocytes,Ua: NEGATIVE
Nitrite: NEGATIVE
Protein, ur: NEGATIVE mg/dL
Specific Gravity, Urine: >1.046 — ABNORMAL HIGH (ref 1.005–1.030)
pH: 6 (ref 5.0–8.0)

## 2019-06-25 LAB — CBC WITH DIFFERENTIAL/PLATELET
Abs Immature Granulocytes: 0.08 10*3/uL — ABNORMAL HIGH (ref 0.00–0.07)
Basophils Absolute: 0.1 10*3/uL (ref 0.0–0.1)
Basophils Relative: 1 %
Eosinophils Absolute: 0.3 10*3/uL (ref 0.0–0.5)
Eosinophils Relative: 5 %
HCT: 40.7 % (ref 39.0–52.0)
Hemoglobin: 13.5 g/dL (ref 13.0–17.0)
Immature Granulocytes: 1 %
Lymphocytes Relative: 36 %
Lymphs Abs: 2.4 10*3/uL (ref 0.7–4.0)
MCH: 31.9 pg (ref 26.0–34.0)
MCHC: 33.2 g/dL (ref 30.0–36.0)
MCV: 96.2 fL (ref 80.0–100.0)
Monocytes Absolute: 0.6 10*3/uL (ref 0.1–1.0)
Monocytes Relative: 8 %
Neutro Abs: 3.3 10*3/uL (ref 1.7–7.7)
Neutrophils Relative %: 49 %
Platelets: 185 10*3/uL (ref 150–400)
RBC: 4.23 MIL/uL (ref 4.22–5.81)
RDW: 12.1 % (ref 11.5–15.5)
WBC: 6.6 10*3/uL (ref 4.0–10.5)
nRBC: 0 % (ref 0.0–0.2)

## 2019-06-25 LAB — LIPASE, BLOOD: Lipase: 30 U/L (ref 11–51)

## 2019-06-25 MED ORDER — HYDROCODONE-ACETAMINOPHEN 5-325 MG PO TABS: 1.0000 | ORAL_TABLET | Freq: Four times a day (QID) | ORAL | 0 refills | Status: DC | PRN

## 2019-06-25 MED ORDER — HYDROMORPHONE HCL 1 MG/ML IJ SOLN
1.0000 mg | Freq: Once | INTRAMUSCULAR | Status: AC
  Administered 2019-06-25: 1 mg via INTRAVENOUS
  Filled 2019-06-25: qty 1

## 2019-06-25 MED ORDER — FENTANYL CITRATE (PF) 100 MCG/2ML IJ SOLN
100.0000 ug | Freq: Once | INTRAMUSCULAR | Status: AC
  Administered 2019-06-25: 100 ug via INTRAVENOUS
  Filled 2019-06-25: qty 2

## 2019-06-25 MED ORDER — IOHEXOL 300 MG/ML  SOLN
100.0000 mL | Freq: Once | INTRAMUSCULAR | Status: AC | PRN
  Administered 2019-06-25: 100 mL via INTRAVENOUS

## 2019-06-25 MED ORDER — NAPROXEN 500 MG PO TABS: 500.0000 mg | ORAL_TABLET | Freq: Two times a day (BID) | ORAL | 0 refills | Status: AC

## 2019-06-25 MED ORDER — KETOROLAC TROMETHAMINE 30 MG/ML IJ SOLN
30.0000 mg | Freq: Once | INTRAMUSCULAR | Status: AC
  Administered 2019-06-25: 30 mg via INTRAVENOUS
  Filled 2019-06-25: qty 1

## 2019-06-25 MED ORDER — ALBUTEROL SULFATE HFA 108 (90 BASE) MCG/ACT IN AERS
4.0000 | INHALATION_SPRAY | Freq: Once | RESPIRATORY_TRACT | Status: AC
  Administered 2019-06-25: 4 via RESPIRATORY_TRACT
  Filled 2019-06-25: qty 6.7

## 2019-06-25 NOTE — ED Provider Notes (Signed)
Care assumed from Dr. Clayborne Dana at shift change.  Patient presents here with a 2-day history of pain to his left upper quadrant.  This began acutely after sneezing.  Patient awaiting results of a CT scan to rule out intra-abdominal pathology.  CT scan has resulted and shows no acute process.  Patient remains tender to the left upper quadrant, but vitals remained stable and patient is afebrile.  His laboratory studies are reassuring.  At this point, I see no emergent situation and believe patient is appropriate for discharge with anti-inflammatories and medication for his pain.  He is to follow-up with primary doctor if not improving.   Geoffery Lyons, MD 06/25/19 [phone]

## 2019-06-25 NOTE — ED Triage Notes (Signed)
Pt states he sneezed this past Tuesday and has had pain under L breast since then. Pt was seen by PCP and xrays were taken and he was told it was "probably muscle related" and instructed to take Tylenol and Ibuprofen a but pt states pain is worse.

## 2019-06-25 NOTE — ED Notes (Signed)
Pt verified no allergy to dilaudid. " I have taken it before and I was fine"

## 2019-06-25 NOTE — Discharge Instructions (Signed)
Begin taking naproxen as prescribed.  Take hydrocodone as prescribed as needed for pain not relieved with naproxen.  Follow-up with your primary doctor if symptoms or not improving in the next 3 to 4 days, and return to the ER if symptoms significantly worsen or change.

## 2019-06-25 NOTE — ED Provider Notes (Signed)
[facility] EMERGENCY DEPARTMENT Provider Note   CSN: [id number] Arrival date & time: 06/25/19  5361     History Chief Complaint  Patient presents with  . Abdominal Pain    [name] is a 39 y.o. male presenting with 2d hx of LUQ abdominal pain. Non-radiating. Began after sneezing. He was seen by his PCP at that where a CXR was obtained and found to be negative. Since that office visit, he notes progressive pain. Denies fevers, chills, n/v, diarrhea or constipation. No aggrevating/alleviating sx.  Somewhat pleuritic in nature. No known sick contacts. Surgical history significant for cholecystectomy, appendectomy, and hernia repair.    Past Medical History:  Diagnosis Date  . Alpha-1-antitrypsin deficiency carrier    labs 05/2016 with documented MZ allele  . Anxiety   . Bipolar 2 disorder (HCC)   . Chronic abdominal pain    HIDA scan Sept 2010, EF 93%, s/p chole oct 2010; evaluated at [facility] for abdominal wall pain; Imipramine added May 2011  . Chronic vomiting    normal GES 2009  . Depression    increased over past several months  . GERD (gastroesophageal reflux disease)    Bravo study Nov 2009 on Prilosec 40 BID with adequate acid suppression  . Hiatal hernia   . Hypertension   . S/P endoscopy    2008 Dr. Karilyn Cota: erosive reflux esophagitis, antral gastritis, H.pylori serologies neg, Nov 2009 Dr. Darrick Penna: gastritis, benign esophageal polyp, no H.pylori,  Feb 2011: [facility], Dr. Bubba Hales: normal esophagus, normal stomach, normal duodenum, path unremarkable  . Shortness of breath    SOB even with no exertion  . Skull fracture (HCC)   . Sleep apnea    supposed to sleep with CPAP  . Ventral hernia     Patient Active Problem List   Diagnosis Date Noted  . Hiatal hernia 06/14/2016  . Alpha-1-antitrypsin deficiency carrier 06/14/2016  . Atypical chest pain 06/14/2016  . Abnormal nuclear stress test   . Intracranial hemorrhage (HCC) 01/20/2016  . Nausea without vomiting  12/15/2015  . Early satiety 07/25/2015  . Gastroesophageal reflux disease with esophagitis 06/27/2015  . Nausea vomiting and diarrhea 04/27/2015  . Transaminitis 04/27/2015  . Cough 06/23/2013  . Tobacco abuse counseling 06/23/2013  . Abdominal cramping 05/05/2013  . COPD (chronic obstructive pulmonary disease) with acute bronchitis (HCC) 05/05/2013  . Syncope 05/05/2013  . Depression with anxiety 05/05/2013  . Hyperglycemia 05/05/2013  . Black-out (not amnesia) 05/05/2013  . Smoking 04/16/2013  . Blackout spell 04/16/2013  . Hx of bipolar disorder 04/16/2013  . GERD (gastroesophageal reflux disease) 04/16/2013  . OSA (obstructive sleep apnea) 04/16/2013  . Dyspnea 04/16/2013  . Bipolar 2 disorder (HCC) 03/08/2013  . HTN (hypertension) 03/08/2013  . Tobacco abuse 03/08/2013  . Referred for management of medication therapy 03/08/2013  . Asthma exacerbation, non-allergic 03/08/2013  . Sore throat 03/08/2013  . Unspecified sleep apnea 03/08/2013  . Pain in joint, ankle and foot 11/25/2012  . Hyperbilirubinemia 01/10/2012  . Ventral hernia without obstruction or gangrene 11/12/2011  . Obesity 11/12/2011  . Abdominal wall pain 03/06/2011  . NAUSEA WITH VOMITING 05/15/2009  . WEIGHT GAIN, ABNORMAL 03/21/2009  . EPIGASTRIC PAIN, CHRONIC 03/21/2009  . 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 SMOKER 03/20/2009  . DEPRESSION 03/20/2009  . HYPERTENSION 03/20/2009  . GERD 03/20/2009  . HEMATEMESIS 03/20/2009  . SHOULDER PAIN, RIGHT 03/20/2009  . ANOREXIA 03/20/2009  . ABDOMINAL PAIN, GENERALIZED 03/20/2009  . CANNABIS ABUSE, HX OF 03/20/2009    Past Surgical  History:  Procedure Laterality Date  . APPENDECTOMY  5/10  . BIOPSY  08/15/2015   Procedure: BIOPSY;  Surgeon: West Bali, MD;  Location: [facility];  Service: Endoscopy;;  gastric bx  . CHOLECYSTECTOMY  10/10  . ESOPHAGOGASTRODUODENOSCOPY  05/2006   H.pylori neg  . ESOPHAGOGASTRODUODENOSCOPY  03/23/2008   Fields-gastritis benign  esophageal polyp, otherwise normal. Negative H. pylori he (propofol)  . ESOPHAGOGASTRODUODENOSCOPY (EGD) WITH PROPOFOL N/A 08/15/2015   Procedure: ESOPHAGOGASTRODUODENOSCOPY (EGD) WITH PROPOFOL;  Surgeon: West Bali, MD;  Location: [facility];  Service: Endoscopy;  Laterality: N/A;  [id number]  . INCISIONAL HERNIA REPAIR  03/04/2012   Procedure: LAPAROSCOPIC INCISIONAL HERNIA;  Surgeon: Dalia Heading, MD;  Location: [facility] ORS;  Service: General;  Laterality: N/A;  repair, recurrent  . INCISIONAL HERNIA REPAIR N/A 01/12/2014   Procedure: HERNIA REPAIR INCISIONAL WITH MESH;  Surgeon: Dalia Heading, MD;  Location: [facility] OR[facility];  Service: General;  Laterality: N/A;  . INGUINAL HERNIA REPAIR     child  . INSERTION OF MESH  03/04/2012   Procedure: INSERTION OF MESH;  Surgeon: Dalia Heading, MD;  Location: [facility];  Service: General;  Laterality: N/A;  . INSERTION OF MESH N/A 01/12/2014   Procedure: INSERTION OF MESH;  Surgeon: Dalia Heading, MD;  Location: [facility];  Service: General;  Laterality: N/A;  . KNEE SURGERY Right 2009  . LEFT HEART CATH AND CORONARY ANGIOGRAPHY N/A 06/12/2016   Procedure: Left Heart Cath and Coronary Angiography;  Surgeon: Corky Crafts, MD;  Location: [facility] INVASIVE CV LAB;  Service: Cardiovascular;  Laterality: N/A;  . VENTRAL HERNIA REPAIR  March 2012   Dr. Lovell Sheehan       Family History  Problem Relation Age of Onset  . Diabetes Mother   . CAD Mother   . CAD Father   . Colon cancer Neg Hx     Social History   Tobacco Use  . Smoking status: Current Every Day Smoker    Packs/day: 1.00    Years: 15.00    Pack years: 15.00    Types: Cigarettes  . Smokeless tobacco: Never Used  Substance Use Topics  . Alcohol use: No  . Drug use: No    Comment: pt reports he takes xanax but denies other drug use     Home Medications Prior to Admission medications   Medication Sig Start Date End Date Taking? Authorizing Provider  acetaminophen (TYLENOL) 500 MG tablet Take 500 mg  by mouth every 6 (six) hours as needed for mild pain or moderate pain.    [provider]  albuterol (PROVENTIL HFA;VENTOLIN HFA) 108 (90 Base) MCG/ACT inhaler Inhale 2 puffs into the lungs every 6 (six) hours as needed for wheezing or shortness of breath.    [provider]  albuterol (VENTOLIN HFA) 108 (90 Base) MCG/ACT inhaler Inhale 2 puffs into the lungs every 4 (four) hours as needed for wheezing or shortness of breath. 10/07/18   Cathren Laine, MD  alprazolam Prudy Feeler) 2 MG tablet Take 2 mg by mouth 3 (three) times daily.     [provider]  fluticasone (FLONASE) 50 MCG/ACT nasal spray Place 2 sprays into both nostrils daily as needed for allergies.     [provider]  ibuprofen (ADVIL) 200 MG tablet Take 200 mg by mouth every 6 (six) hours as needed for mild pain or moderate pain.    [provider]  loratadine (CLARITIN) 10 MG tablet Take 10 mg  by mouth daily as needed for allergies.    [provider]  methocarbamol (ROBAXIN) 500 MG tablet Take 1 tablet (500 mg total) by mouth 2 (two) times daily. 12/01/18   Carlyle Basques P, PA-C  nitroGLYCERIN (NITROSTAT) 0.4 MG SL tablet Place 1 tablet (0.4 mg total) under the tongue every 5 (five) minutes as needed for chest pain. 05/30/16 09/15/18  Laqueta Linden, MD  omeprazole (PRILOSEC) 40 MG capsule Take 40 mg by mouth 2 (two) times daily.    [provider]  oxyCODONE-acetaminophen (PERCOCET/ROXICET) 5-325 MG tablet Take 2 tablets by mouth every 4 (four) hours as needed for severe pain. 09/16/18   Triplett, Tammy, PA-C  promethazine (PHENERGAN) 25 MG tablet Take 1 tablet (25 mg total) by mouth every 6 (six) hours as needed for nausea or vomiting. 09/16/18   Triplett, Tammy, PA-C    Allergies    Buprenorphine, Dexilant [dexlansoprazole], Propoxyphene n-acetaminophen, and Zofran [ondansetron hcl]  Review of Systems   Review of Systems  Constitutional: Negative for chills and fever.    HENT: Negative.   Respiratory: Positive for shortness of breath and wheezing. Negative for cough.   Cardiovascular: Negative for chest pain.  Gastrointestinal: Positive for abdominal pain. Negative for blood in stool, constipation, diarrhea, nausea and vomiting.  Genitourinary: Negative for dysuria, flank pain, frequency and urgency.  Musculoskeletal: Negative.   Skin: Negative for rash.  Neurological: Negative.   Psychiatric/Behavioral: Negative.     Physical Exam Updated Vital Signs BP 128/86 (BP Location: Left Arm)   Pulse 93   Temp 97.9 F (36.6 C) (Oral)   Resp (!) 22   Ht 5\' 9"  (1.753 m)   Wt 129.3 kg   SpO2 100%   BMI 42.09 kg/m   Physical Exam Constitutional:      General: He is in acute distress.  HENT:     Mouth/Throat:     Mouth: Mucous membranes are moist.     Pharynx: Oropharynx is clear.  Cardiovascular:     Rate and Rhythm: Normal rate and regular rhythm.  Pulmonary:     Effort: Pulmonary effort is normal.     Breath sounds: Wheezing present.  Abdominal:     General: A surgical scar is present. There is distension.     Tenderness: There is abdominal tenderness in the right upper quadrant. There is guarding and rebound. There is no right CVA tenderness or left CVA tenderness.  Skin:    General: Skin is warm and dry.  Neurological:     General: No focal deficit present.     Mental Status: He is alert.  Psychiatric:        Mood and Affect: Mood normal.     ED Results / Procedures / Treatments   Labs (all labs ordered are listed, but only abnormal results are displayed) Labs Reviewed  CBC WITH DIFFERENTIAL/PLATELET  COMPREHENSIVE METABOLIC PANEL  LIPASE, BLOOD  URINALYSIS, ROUTINE W REFLEX MICROSCOPIC    EKG None  Radiology No results found.   Medications Ordered in ED Medications  fentaNYL (SUBLIMAZE) injection 100 mcg (has no administration in time range)  albuterol (VENTOLIN HFA) 108 (90 Base) MCG/ACT inhaler 4 puff (4 puffs  Inhalation Given 06/25/19 0641)    ED Course  I have reviewed the triage vital signs and the nursing notes.  Pertinent labs & imaging results that were available during my care of the patient were reviewed by me and considered in my medical decision making (see chart for details).  MDM Rules/Calculators/A&P  39 yo male presenting with LUQ pain. No other GI symptoms and no fever/chills to suggest infectious process. Ddx includes peritonitis, PUD, gastritis, nephrolithiasis, hernia, bowel obstruction, aortic disection, ACS, PE, or MSK. Most likely MSK as he did have some pain with movement of his left arm. However he did also have some rebound tenderness so can not r/o intra-abdominal pathology. Although he denied constipation, can not r/o a bowel obstruction, especially with his numerous abdominal surgeries in the past, however other things are more likely.  No EKG findings to suggest ACS. He did have some wheezing on exam however this was bilateral and he does have a history of asthma. He has no risk factors other than obesity to concern me for PE.  He did also seem to have some discomfort over his lower ribs on the left which could be consistent with MSK. Will obtain some labs and CT abdomen for further evaluation. Can consider GI cocktail as well.  Final Clinical Impression(s) / ED Diagnoses Final diagnoses:  None    Rx / DC Orders ED Discharge Orders    None       Mitzi Hansen, MD 06/25/19 9233    Merrily Pew, MD 06/25/19 2259

## 2019-07-05 ENCOUNTER — Emergency Department (HOSPITAL_COMMUNITY)
Admission: EM | Admit: 2019-07-05 | Discharge: 2019-07-05 | Disposition: A | Discharge status: Home / Self Care | Payer: Medicaid Other | Attending: Emergency Medicine | Admitting: Emergency Medicine

## 2019-07-05 ENCOUNTER — Encounter (HOSPITAL_COMMUNITY): Payer: Self-pay | Admitting: *Deleted

## 2019-07-05 ENCOUNTER — Emergency Department (HOSPITAL_COMMUNITY): Payer: Medicaid Other

## 2019-07-05 ENCOUNTER — Other Ambulatory Visit: Payer: Self-pay

## 2019-07-05 DIAGNOSIS — J449 Chronic obstructive pulmonary disease, unspecified: Secondary | ICD-10-CM | POA: Insufficient documentation

## 2019-07-05 DIAGNOSIS — F17210 Nicotine dependence, cigarettes, uncomplicated: Secondary | ICD-10-CM | POA: Insufficient documentation

## 2019-07-05 DIAGNOSIS — X58XXXD Exposure to other specified factors, subsequent encounter: Secondary | ICD-10-CM | POA: Diagnosis not present

## 2019-07-05 DIAGNOSIS — S299XXD Unspecified injury of thorax, subsequent encounter: Secondary | ICD-10-CM | POA: Insufficient documentation

## 2019-07-05 DIAGNOSIS — S299XXA Unspecified injury of thorax, initial encounter: Secondary | ICD-10-CM

## 2019-07-05 DIAGNOSIS — I10 Essential (primary) hypertension: Secondary | ICD-10-CM | POA: Insufficient documentation

## 2019-07-05 DIAGNOSIS — Z79899 Other long term (current) drug therapy: Secondary | ICD-10-CM | POA: Diagnosis not present

## 2019-07-05 MED ORDER — NITROGLYCERIN 0.4 MG SL SUBL: 0.4000 mg | SUBLINGUAL_TABLET | Freq: Once | SUBLINGUAL | Status: DC

## 2019-07-05 MED ORDER — KETOROLAC TROMETHAMINE 30 MG/ML IJ SOLN
30.0000 mg | Freq: Once | INTRAMUSCULAR | Status: AC
  Administered 2019-07-05: 30 mg via INTRAMUSCULAR
  Filled 2019-07-05: qty 1

## 2019-07-05 MED ORDER — HYDROCODONE-ACETAMINOPHEN 5-325 MG PO TABS: 1.0000 | ORAL_TABLET | Freq: Three times a day (TID) | ORAL | 0 refills | Status: AC | PRN

## 2019-07-05 MED ORDER — ALBUTEROL SULFATE (2.5 MG/3ML) 0.083% IN NEBU
5.0000 mg | INHALATION_SOLUTION | Freq: Once | RESPIRATORY_TRACT | Status: DC
  Filled 2019-07-05: qty 6

## 2019-07-05 MED ORDER — ALBUTEROL SULFATE HFA 108 (90 BASE) MCG/ACT IN AERS
2.0000 | INHALATION_SPRAY | Freq: Four times a day (QID) | RESPIRATORY_TRACT | Status: DC
  Administered 2019-07-05: 2 via RESPIRATORY_TRACT
  Filled 2019-07-05: qty 6.7

## 2019-07-05 MED ORDER — FENTANYL CITRATE (PF) 100 MCG/2ML IJ SOLN
50.0000 ug | Freq: Once | INTRAMUSCULAR | Status: AC
  Administered 2019-07-05: 50 ug via INTRAMUSCULAR
  Filled 2019-07-05: qty 2

## 2019-07-05 MED ORDER — DICLOFENAC EPOLAMINE 1.3 % EX PTCH
1.0000 | MEDICATED_PATCH | Freq: Two times a day (BID) | CUTANEOUS | Status: DC
  Administered 2019-07-05: 1 via TRANSDERMAL
  Filled 2019-07-05 (×5): qty 1

## 2019-07-05 MED ORDER — GLUCAGON HCL RDNA (DIAGNOSTIC) 1 MG IJ SOLR: 1.0000 mg | Freq: Once | INTRAMUSCULAR | Status: DC

## 2019-07-05 MED ORDER — LORAZEPAM 2 MG/ML IJ SOLN: 1.0000 mg | Freq: Once | INTRAMUSCULAR | Status: DC

## 2019-07-05 MED ORDER — HYDROCODONE-ACETAMINOPHEN 5-325 MG PO TABS
1.0000 | ORAL_TABLET | Freq: Once | ORAL | Status: AC
  Administered 2019-07-05: 1 via ORAL
  Filled 2019-07-05: qty 1

## 2019-07-05 NOTE — Discharge Instructions (Addendum)
As discussed, you have been diagnosed with an injury of the costal cartilage.  This is the cartilage that holds some of your ribs in place.  Typically these injuries can take several weeks to improve.  In addition to Tylenol, ibuprofen, please obtain medicated patches that include the ingredients lidocaine and methyl salicylate.  Apply these as directed on the packaging. Monitor your condition carefully and do not hesitate to return here if you develop new, or concerning changes. The interpretation of your CT scan is included below:  IMPRESSION: Nondisplaced fractures of the left eighth costal cartilage and likely anterior left costal cartilage as well with minimal adjacent stranding.

## 2019-07-05 NOTE — ED Triage Notes (Signed)
Pt with LUQ pain, seen for same on 2/19.  Pt states he feels a "pop" with a deep breath.  Pt has continued since but got worse last night.

## 2019-07-05 NOTE — ED Provider Notes (Signed)
[facility] EMERGENCY DEPARTMENT Provider Note   CSN: [id number] Arrival date & time: 07/05/19  1557     History Chief Complaint  Patient presents with  . Abdominal Pain    [name] is a 39 y.o. male.  HPI    Patient presents with history of pain and a popping sensation in the left anterior mid chest wall.  He notes that he sustained an initial injury about 2 weeks ago, was seen here following that.  He notes that pain has been persistently in the area just inferior to his left nipple, but today, the pain changed with onset of sound.  There is no clear precipitant for this change, and since onset has been worse, particularly with motion.  Slightly better with pressure application.  There is mild dyspnea secondary to pain in this area, no other chest pain, no fever, no vomiting, no other notable changes from baseline.   Chart review notable for evaluation following presumed onset of symptoms about 2 weeks ago.  At that point the patient had CT abdomen pelvis that did not show acute findings.  Past Medical History:  Diagnosis Date  . Alpha-1-antitrypsin deficiency carrier    labs 05/2016 with documented MZ allele  . Anxiety   . Bipolar 2 disorder (HCC)   . Chronic abdominal pain    HIDA scan Sept 2010, EF 93%, s/p chole oct 2010; evaluated at [facility] for abdominal wall pain; Imipramine added May 2011  . Chronic vomiting    normal GES 2009  . Depression    increased over past several months  . GERD (gastroesophageal reflux disease)    Bravo study Nov 2009 on Prilosec 40 BID with adequate acid suppression  . Hiatal hernia   . Hypertension   . S/P endoscopy    2008 Dr. Karilyn Cota: erosive reflux esophagitis, antral gastritis, H.pylori serologies neg, Nov 2009 Dr. Darrick Penna: gastritis, benign esophageal polyp, no H.pylori,  Feb 2011: [facility], Dr. Bubba Hales: normal esophagus, normal stomach, normal duodenum, path unremarkable  . Shortness of breath    SOB even with no exertion  .  Skull fracture (HCC)   . Sleep apnea    supposed to sleep with CPAP  . Ventral hernia     Patient Active Problem List   Diagnosis Date Noted  . Hiatal hernia 06/14/2016  . Alpha-1-antitrypsin deficiency carrier 06/14/2016  . Atypical chest pain 06/14/2016  . Abnormal nuclear stress test   . Intracranial hemorrhage (HCC) 01/20/2016  . Nausea without vomiting 12/15/2015  . Early satiety 07/25/2015  . Gastroesophageal reflux disease with esophagitis 06/27/2015  . Nausea vomiting and diarrhea 04/27/2015  . Transaminitis 04/27/2015  . Cough 06/23/2013  . Tobacco abuse counseling 06/23/2013  . Abdominal cramping 05/05/2013  . COPD (chronic obstructive pulmonary disease) with acute bronchitis (HCC) 05/05/2013  . Syncope 05/05/2013  . Depression with anxiety 05/05/2013  . Hyperglycemia 05/05/2013  . Black-out (not amnesia) 05/05/2013  . Smoking 04/16/2013  . Blackout spell 04/16/2013  . Hx of bipolar disorder 04/16/2013  . GERD (gastroesophageal reflux disease) 04/16/2013  . OSA (obstructive sleep apnea) 04/16/2013  . Dyspnea 04/16/2013  . Bipolar 2 disorder (HCC) 03/08/2013  . HTN (hypertension) 03/08/2013  . Tobacco abuse 03/08/2013  . Referred for management of medication therapy 03/08/2013  . Asthma exacerbation, non-allergic 03/08/2013  . Sore throat 03/08/2013  . Unspecified sleep apnea 03/08/2013  . Pain in joint, ankle and foot 11/25/2012  . Hyperbilirubinemia 01/10/2012  . Ventral hernia without obstruction or gangrene 11/12/2011  .  Obesity 11/12/2011  . Abdominal wall pain 03/06/2011  . NAUSEA WITH VOMITING 05/15/2009  . WEIGHT GAIN, ABNORMAL 03/21/2009  . EPIGASTRIC PAIN, CHRONIC 03/21/2009  . 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 SMOKER 03/20/2009  . DEPRESSION 03/20/2009  . HYPERTENSION 03/20/2009  . GERD 03/20/2009  . HEMATEMESIS 03/20/2009  . SHOULDER PAIN, RIGHT 03/20/2009  . ANOREXIA 03/20/2009  . ABDOMINAL PAIN, GENERALIZED 03/20/2009  . CANNABIS ABUSE, HX OF 03/20/2009     Past Surgical History:  Procedure Laterality Date  . APPENDECTOMY  5/10  . BIOPSY  08/15/2015   Procedure: BIOPSY;  Surgeon: West Bali, MD;  Location: [facility];  Service: Endoscopy;;  gastric bx  . CHOLECYSTECTOMY  10/10  . ESOPHAGOGASTRODUODENOSCOPY  05/2006   H.pylori neg  . ESOPHAGOGASTRODUODENOSCOPY  03/23/2008   Fields-gastritis benign esophageal polyp, otherwise normal. Negative H. pylori he (propofol)  . ESOPHAGOGASTRODUODENOSCOPY (EGD) WITH PROPOFOL N/A 08/15/2015   Procedure: ESOPHAGOGASTRODUODENOSCOPY (EGD) WITH PROPOFOL;  Surgeon: West Bali, MD;  Location: [facility];  Service: Endoscopy;  Laterality: N/A;  3016  . INCISIONAL HERNIA REPAIR  03/04/2012   Procedure: LAPAROSCOPIC INCISIONAL HERNIA;  Surgeon: Dalia Heading, MD;  Location: [facility] ORS;  Service: General;  Laterality: N/A;  repair, recurrent  . INCISIONAL HERNIA REPAIR N/A 01/12/2014   Procedure: HERNIA REPAIR INCISIONAL WITH MESH;  Surgeon: Dalia Heading, MD;  Location: [facility] OR[facility];  Service: General;  Laterality: N/A;  . INGUINAL HERNIA REPAIR     child  . INSERTION OF MESH  03/04/2012   Procedure: INSERTION OF MESH;  Surgeon: Dalia Heading, MD;  Location: [facility] OR[facility];  Service: General;  Laterality: N/A;  . INSERTION OF MESH N/A 01/12/2014   Procedure: INSERTION OF MESH;  Surgeon: Dalia Heading, MD;  Location: [facility] ORS;  Service: General;  Laterality: N/A;  . KNEE SURGERY Right 2009  . LEFT HEART CATH AND CORONARY ANGIOGRAPHY N/A 06/12/2016   Procedure: Left Heart Cath and Coronary Angiography;  Surgeon: Corky Crafts, MD;  Location: [facility] INVASIVE CV LAB;  Service: Cardiovascular;  Laterality: N/A;  . VENTRAL HERNIA REPAIR  March 2012   Dr. Lovell Sheehan       Family History  Problem Relation Age of Onset  . Diabetes Mother   . CAD Mother   . CAD Father   . Colon cancer Neg Hx     Social History   Tobacco Use  . Smoking status: Current Every Day Smoker    Packs/day: 1.00    Years: 15.00    Pack  years: 15.00    Types: Cigarettes  . Smokeless tobacco: Never Used  Substance Use Topics  . Alcohol use: No  . Drug use: No    Comment: pt reports he takes xanax but denies other drug use     Home Medications Prior to Admission medications   Medication Sig Start Date End Date Taking? Authorizing Provider  acetaminophen (TYLENOL) 500 MG tablet Take 500 mg by mouth every 6 (six) hours as needed for mild pain or moderate pain.    [provider]  albuterol (PROVENTIL HFA;VENTOLIN HFA) 108 (90 Base) MCG/ACT inhaler Inhale 2 puffs into the lungs every 6 (six) hours as needed for wheezing or shortness of breath.    [provider]  albuterol (VENTOLIN HFA) 108 (90 Base) MCG/ACT inhaler Inhale 2 puffs into the lungs every 4 (four) hours as needed for wheezing or shortness of breath. 10/07/18   Cathren Laine, MD  alprazolam Prudy Feeler) 2 MG tablet Take  2 mg by mouth 3 (three) times daily.     [provider]  fluticasone (FLONASE) 50 MCG/ACT nasal spray Place 2 sprays into both nostrils daily as needed for allergies.     [provider]  HYDROcodone-acetaminophen (NORCO) 5-325 MG tablet Take 1-2 tablets by mouth every 6 (six) hours as needed. 06/25/19   Geoffery Lyons, MD  ibuprofen (ADVIL) 200 MG tablet Take 200 mg by mouth every 6 (six) hours as needed for mild pain or moderate pain.    [provider]  ibuprofen (ADVIL) 800 MG tablet Take 800 mg by mouth 2 (two) times daily as needed. 06/23/19   [provider]  lisinopril-hydrochlorothiazide (ZESTORETIC) 20-25 MG tablet Take 1 tablet by mouth daily. 05/21/19   [provider]  loratadine (CLARITIN) 10 MG tablet Take 10 mg by mouth daily as needed for allergies.    [provider]  methocarbamol (ROBAXIN) 500 MG tablet Take 1 tablet (500 mg total) by mouth 2 (two) times daily. 12/01/18   Carlyle Basques P, PA-C  naproxen (NAPROSYN) 500 MG tablet Take 1 tablet (500 mg total) by mouth 2  (two) times daily with a meal. 06/25/19   Geoffery Lyons, MD  nitroGLYCERIN (NITROSTAT) 0.4 MG SL tablet Place 1 tablet (0.4 mg total) under the tongue every 5 (five) minutes as needed for chest pain. 05/30/16 09/15/18  Laqueta Linden, MD  omeprazole (PRILOSEC) 40 MG capsule Take 40 mg by mouth 2 (two) times daily.    [provider]  oxyCODONE-acetaminophen (PERCOCET/ROXICET) 5-325 MG tablet Take 2 tablets by mouth every 4 (four) hours as needed for severe pain. 09/16/18   Triplett, Tammy, PA-C  promethazine (PHENERGAN) 25 MG tablet Take 1 tablet (25 mg total) by mouth every 6 (six) hours as needed for nausea or vomiting. 09/16/18   Triplett, Tammy, PA-C    Allergies    Buprenorphine, Dexilant [dexlansoprazole], Propoxyphene n-acetaminophen, and Zofran [ondansetron hcl]  Review of Systems   Review of Systems  Constitutional:       Per HPI, otherwise negative  HENT:       Per HPI, otherwise negative  Respiratory:       Per HPI, otherwise negative  Cardiovascular:       Per HPI, otherwise negative  Gastrointestinal: Negative for vomiting.  Endocrine:       Negative aside from HPI  Genitourinary:       Neg aside from HPI   Musculoskeletal:       Per HPI, otherwise negative  Skin: Negative.   Neurological: Negative for syncope.    Physical Exam Updated Vital Signs BP 138/82   Pulse 98   Temp 98.3 F (36.8 C) (Oral)   Resp 16   Ht 5\' 9"  (1.753 m)   Wt 131.5 kg   SpO2 100%   BMI 42.83 kg/m   Physical Exam Vitals and nursing note reviewed.  Constitutional:      General: He is not in acute distress.    Appearance: He is well-developed. He is obese.     Comments: Patient speaking clearly, holding pressure to the left chest wall with his right hand.  HENT:     Head: Normocephalic and atraumatic.  Eyes:     Conjunctiva/sclera: Conjunctivae normal.  Cardiovascular:     Rate and Rhythm: Regular rhythm. Tachycardia present.  Pulmonary:     Effort: Pulmonary effort  is normal. No respiratory distress.     Breath sounds: No stridor. Wheezing present.  Abdominal:  General: There is no distension.  Skin:    General: Skin is warm and dry.  Neurological:     Mental Status: He is alert and oriented to person, place, and time.     ED Results / Procedures / Treatments   Labs (all labs ordered are listed, but only abnormal results are displayed) Labs Reviewed - No data to display  EKG None  Radiology DG Ribs Unilateral W/Chest Left  Result Date: 07/05/2019 CLINICAL DATA:  Left upper quadrant pain for 2-3 weeks. The patient feels a with the breathing. EXAM: LEFT RIBS AND CHEST - 3+ VIEW COMPARISON:  PA and lateral chest 06/21/2019. FINDINGS: Lungs are clear. Heart size is normal. No pneumothorax or pleural fluid. No fracture or other focal bony abnormality. IMPRESSION: Negative exam. Electronically Signed   By: Drusilla Kanner M.D.   On: 07/05/2019 17:33   CT Chest Wo Contrast  Result Date: 07/05/2019 CLINICAL DATA:  Rib fracture suspected, possible occult fracture in the left lower chest EXAM: CT CHEST WITHOUT CONTRAST TECHNIQUE: Multidetector CT imaging of the chest was performed following the standard protocol without IV contrast. COMPARISON:  Radiograph 07/05/2019 FINDINGS: Cardiovascular: Normal heart size. No pericardial effusion. The aorta is normal caliber. Shared origin of the brachiocephalic and left common carotid artery. Proximal great vessels are otherwise unremarkable. Central pulmonary arteries are normal caliber. Luminal evaluation precluded in the absence of contrast media. Mediastinum/Nodes: No mediastinal fluid or gas. Normal thyroid gland and thoracic inlet. No acute abnormality of the trachea or esophagus. No worrisome mediastinal or axillary adenopathy. Hilar nodal evaluation is limited in the absence of intravenous contrast media. Lungs/Pleura: No acute traumatic abnormality of the lung parenchyma. No pleural effusion or pneumothorax.  Abundant subpleural fat is present, likely related to body habitus. No suspicious pulmonary nodules or masses. Upper Abdomen: No acute abnormalities present in the visualized portions of the upper abdomen. Musculoskeletal: Fracture of the left eighth costal cartilage (2/121) and likely anterior most left ninth costal cartilage as well (5/15) with mild adjacent stranding. No visible osseous rib fractures are seen. No other acute traumatic osseous or soft tissue abnormality. Multilevel degenerative changes are present in the imaged portions of the spine. No acute osseous abnormality or suspicious osseous lesion. IMPRESSION: Nondisplaced fractures of the left eighth costal cartilage and likely anterior left costal cartilage as well with minimal adjacent stranding. No acute traumatic abnormality of the lung parenchyma. No pneumothorax or effusion. Electronically Signed   By: Kreg Shropshire M.D.   On: 07/05/2019 18:33    Procedures Procedures (including critical care time)  Medications Ordered in ED Medications  diclofenac (FLECTOR) 1.3 % 1 patch (1 patch Transdermal Patch Applied 07/05/19 1655)  albuterol (VENTOLIN HFA) 108 (90 Base) MCG/ACT inhaler 2 puff (2 puffs Inhalation Not Given 07/05/19 1910)  ketorolac (TORADOL) 30 MG/ML injection 30 mg (30 mg Intramuscular Given 07/05/19 1651)  fentaNYL (SUBLIMAZE) injection 50 mcg (50 mcg Intramuscular Given 07/05/19 1831)    ED Course  I have reviewed the triage vital signs and the nursing notes.  Pertinent labs & imaging results that were available during my care of the patient were reviewed by me and considered in my medical decision making (see chart for details).  Update:, Patient continues to complain of pain.  Initial x-ray reviewed, no obvious fracture. With concern for occult fracture, CT scan will be performed.  Patient has received Toradol, topical diclofenac, IM fentanyl. 7:11 PM Patient awake, alert, hemodynamically unremarkable.  Now on reviewing the  CT scan,  there is evidence for multiple fractures of costal cartilage, likely contributing to the patient's ongoing pain.  No evidence for pneumothorax, or other fracture of true rib. Absent hemodynamic instability, the patient is appropriate for discharge with ongoing analgesia, outpatient follow-up. Final Clinical Impression(s) / ED Diagnoses Final diagnoses:  Injury of costal cartilage, initial encounter      Carmin Muskrat, MD 07/05/19 1926

## 2019-11-10 ENCOUNTER — Other Ambulatory Visit: Payer: Self-pay | Admitting: Neurology

## 2019-11-10 ENCOUNTER — Other Ambulatory Visit (HOSPITAL_COMMUNITY): Payer: Self-pay | Admitting: Neurology

## 2019-11-10 DIAGNOSIS — S0993XA Unspecified injury of face, initial encounter: Secondary | ICD-10-CM

## 2019-11-23 ENCOUNTER — Ambulatory Visit (HOSPITAL_COMMUNITY): Payer: Medicaid Other

## 2019-12-14 ENCOUNTER — Encounter (HOSPITAL_COMMUNITY): Payer: Self-pay

## 2019-12-14 ENCOUNTER — Ambulatory Visit (HOSPITAL_COMMUNITY): Payer: Medicaid Other

## 2020-04-05 DEATH — deceased
